# Patient Record
Sex: Female | Born: 1975 | Race: Black or African American | Hispanic: No | Marital: Married | Smoking: Never smoker
Health system: Southern US, Community
[De-identification: ages and names within clinical notes are randomized; demographics above are authoritative.]

## PROBLEM LIST (undated history)

## (undated) ENCOUNTER — Emergency Department (HOSPITAL_COMMUNITY): Admission: EM | Payer: BLUE CROSS/BLUE SHIELD

## (undated) DIAGNOSIS — D649 Anemia, unspecified: Secondary | ICD-10-CM

## (undated) DIAGNOSIS — K649 Unspecified hemorrhoids: Secondary | ICD-10-CM

## (undated) DIAGNOSIS — Z801 Family history of malignant neoplasm of trachea, bronchus and lung: Secondary | ICD-10-CM

## (undated) DIAGNOSIS — C801 Malignant (primary) neoplasm, unspecified: Secondary | ICD-10-CM

## (undated) DIAGNOSIS — Z8049 Family history of malignant neoplasm of other genital organs: Secondary | ICD-10-CM

## (undated) DIAGNOSIS — Z803 Family history of malignant neoplasm of breast: Secondary | ICD-10-CM

## (undated) DIAGNOSIS — F419 Anxiety disorder, unspecified: Secondary | ICD-10-CM

## (undated) HISTORY — DX: Family history of malignant neoplasm of other genital organs: Z80.49

## (undated) HISTORY — DX: Family history of malignant neoplasm of breast: Z80.3

## (undated) HISTORY — DX: Unspecified hemorrhoids: K64.9

## (undated) HISTORY — DX: Family history of malignant neoplasm of trachea, bronchus and lung: Z80.1

---
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
## 2019-09-23 ENCOUNTER — Other Ambulatory Visit: Payer: Self-pay | Admitting: Obstetrics and Gynecology

## 2019-09-23 DIAGNOSIS — N6310 Unspecified lump in the right breast, unspecified quadrant: Secondary | ICD-10-CM

## 2019-09-26 ENCOUNTER — Ambulatory Visit
Admission: RE | Admit: 2019-09-26 | Discharge: 2019-09-26 | Disposition: A | Discharge status: Home / Self Care | Payer: Self-pay | Source: Ambulatory Visit | Attending: Obstetrics and Gynecology | Admitting: Obstetrics and Gynecology

## 2019-09-26 ENCOUNTER — Other Ambulatory Visit: Payer: Self-pay | Admitting: Obstetrics and Gynecology

## 2019-09-26 ENCOUNTER — Ambulatory Visit
Admission: RE | Admit: 2019-09-26 | Discharge: 2019-09-26 | Disposition: A | Discharge status: Home / Self Care | Payer: BC Managed Care – PPO | Source: Ambulatory Visit | Attending: Obstetrics and Gynecology | Admitting: Obstetrics and Gynecology

## 2019-09-26 ENCOUNTER — Other Ambulatory Visit: Payer: Self-pay

## 2019-09-26 DIAGNOSIS — N6310 Unspecified lump in the right breast, unspecified quadrant: Secondary | ICD-10-CM

## 2019-09-26 DIAGNOSIS — E041 Nontoxic single thyroid nodule: Secondary | ICD-10-CM

## 2019-09-26 DIAGNOSIS — N6320 Unspecified lump in the left breast, unspecified quadrant: Secondary | ICD-10-CM

## 2019-09-28 ENCOUNTER — Ambulatory Visit
Admission: RE | Admit: 2019-09-28 | Discharge: 2019-09-28 | Disposition: A | Discharge status: Home / Self Care | Payer: BC Managed Care – PPO | Source: Ambulatory Visit | Attending: Obstetrics and Gynecology | Admitting: Obstetrics and Gynecology

## 2019-09-28 DIAGNOSIS — E041 Nontoxic single thyroid nodule: Secondary | ICD-10-CM

## 2019-10-03 ENCOUNTER — Other Ambulatory Visit: Payer: Self-pay

## 2019-10-03 ENCOUNTER — Ambulatory Visit
Admission: RE | Admit: 2019-10-03 | Discharge: 2019-10-03 | Disposition: A | Discharge status: Home / Self Care | Payer: BC Managed Care – PPO | Source: Ambulatory Visit | Attending: Obstetrics and Gynecology | Admitting: Obstetrics and Gynecology

## 2019-10-03 DIAGNOSIS — N6310 Unspecified lump in the right breast, unspecified quadrant: Secondary | ICD-10-CM

## 2019-10-11 ENCOUNTER — Ambulatory Visit: Payer: Self-pay | Admitting: Surgery

## 2019-10-11 NOTE — H&P (Signed)
Krista [name] Documented: 10/11/2019 9:42 AM Location: [facility] Patient #: [id number] DOB: [birth year] Married / Language: English / Race: Black or African American Female   History of Present Illness [name] A. Tehilla Coffel MD; 10/11/2019 4:20 PM) Patient words: Patient sent at the request of Krista [name] [name].D. for enlarging mass in the upper inner quadrant of the right breast. This is been present since last October. It is grown over the last 4-5 months. Mammogram performed which shows a 5 cm mass of her inner quadrant right breast. 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 Patient denies any nipple discharge or pain. She has no other complaints.            44 year old with an enlarging palpable lump in the UPPER INNER RIGHT breast which she states has been there for approximately 4-5 months. This is the patient's initial baseline mammogram.  Family history of breast cancer in her mother and in her sister.  EXAM: DIGITAL DIAGNOSTIC BILATERAL MAMMOGRAM WITH CAD AND TOMO  LIMITED ULTRASOUND BILATERAL BREASTS  COMPARISON: None.  ACR Breast Density Category c: The breast tissue is heterogeneously dense, which may obscure small masses.  FINDINGS: Tomosynthesis and synthesized full field CC and MLO views of both breasts were obtained. Tomosynthesis and synthesized spot compression tangential view of the area of concern in the RIGHT breast was also obtained.  RIGHT: There is a mass with irregular margins in the UPPER INNER QUADRANT at POSTERIOR depth, measuring just over 5 cm maximally, associated with architectural distortion. There are no associated suspicious calcifications. No suspicious findings elsewhere in the RIGHT breast.  Targeted RIGHT breast ultrasound is performed, showing a large hypoechoic mass with irregular margins at the 1 o'clock position approximately 14 cm from nipple,  measuring approximately 2.8 x 5.0 x 5.1 cm, demonstrating posterior acoustic shadowing and demonstrating internal power Doppler flow. A linear structure extending from the margin of the mass within normal fibroglandular tissue may represent a contiguous duct filled with tumor.  Sonographic evaluation of the RIGHT axilla demonstrates no pathologic lymphadenopathy.  LEFT: Focal asymmetry involving the UPPER OUTER QUADRANT at POSTERIOR depth. I do not identify a discrete mass in this location. No suspicious findings elsewhere in the LEFT breast.  Targeted LEFT breast ultrasound is performed, showing normal dense fibroglandular tissue in the [place]. Benign simple cysts are present, the largest at the 2 o'clock position approximately 6 cm from the nipple measuring approximately 4 x 6 x 5 mm. No suspicious solid mass or abnormal acoustic shadowing is identified.  Mammographic images were processed with CAD.  On correlative physical exam, there is a firm fixed mass measuring approximately 5-6 cm in the UPPER INNER QUADRANT of the RIGHT breast corresponding to what the patient is feeling.  IMPRESSION: 1. Highly suspicious approximate 5.1 cm mass involving the UPPER INNER QUADRANT of the RIGHT breast. 2. No pathologic RIGHT axillary lymphadenopathy. 3. No mammographic or sonographic evidence of malignancy involving the LEFT breast.  RECOMMENDATION: Ultrasound-guided core needle biopsy of the RIGHT breast mass.  The ultrasound biopsy procedure was discussed with the patient and her questions were answered. She wishes to proceed and the biopsy has been scheduled at her convenience.  I have discussed the findings and recommendations with the patient.  BI-RADS CATEGORY 5: Highly suggestive of malignancy.   Electronically Signed By: Krista [name] [name].D. On: 09/26/2019 11:54  ADDITIONAL INFORMATION: PROGNOSTIC  INDICATORS Results: IMMUNOHISTOCHEMICAL AND MORPHOMETRIC ANALYSIS PERFORMED MANUALLY The tumor cells are NEGATIVE for Her2 (1+). Estrogen Receptor: 0%, NEGATIVE Progesterone Receptor: 0%, NEGATIVE Proliferation Marker Ki67: 80% REFERENCE RANGE ESTROGEN RECEPTOR NEGATIVE 0% POSITIVE =>1% REFERENCE RANGE PROGESTERONE RECEPTOR NEGATIVE 0% POSITIVE =>1% All controls stained appropriately Enid Cutter MD Pathologist, Electronic Signature ( Signed 10/05/2019) FINAL DIAGNOSIS Diagnosis Breast, right, needle core biopsy, 1 o'clock, 14cmfn - INVASIVE DUCTAL CARCINOMA, SEE COMMENT. 1 of 2 FINAL for Krista [name], Krista [name] ([id number]) Microscopic Comment The carcinoma appears grade 3 and measures 14 mm in greatest linear extent. Prognostic makers will be ordered. Dr. Lyndon Code has reviewed the case. The case was called to [facility] on 10/04/2019.  The patient is a 44 year old female.   Past Surgical History Krista [name], CMA; 10/11/2019 10:14 AM) No pertinent past surgical history   Diagnostic Studies History Krista [name], CMA; 10/11/2019 10:14 AM) Colonoscopy  never Mammogram  within last year Pap Smear  1-5 years ago  Allergies [facility] Teressa Senter, CMA; 10/11/2019 9:43 AM) No Known Allergies [10/11/2019]: No Known Drug Allergies [10/11/2019]:  Medication History ([place], CMA; 10/11/2019 9:43 AM) No Current Medications Medications Reconciled  Social History Krista [name], CMA; 10/11/2019 10:14 AM) Alcohol use  Occasional alcohol use. Caffeine use  Coffee. No drug use  Tobacco use  Former smoker.  Family History Krista Lull R. Krista [name], CMA; 10/11/2019 10:14 AM) Alcohol Abuse  Father, Mother, Sister. Breast Cancer  Mother. Cerebrovascular Accident  Father. Depression  Brother, Daughter, Father, Mother, Sister, Son. Diabetes Mellitus  Mother. Heart Disease  Mother. Heart disease in female family member before age 2   Hypertension  Mother. Thyroid problems  Mother.  Pregnancy / Birth History Krista [name], CMA; 10/11/2019 10:14 AM) Age at menarche  38 years. Contraceptive History  Contraceptive implant. Gravida  6 Length (months) of breastfeeding  7-12 Maternal age  59-20 Para  3 Regular periods   Other Problems Krista [name], CMA; 10/11/2019 10:14 AM) Anxiety Disorder  Hemorrhoids     Review of Systems [facility] R. [name] CMA; 10/11/2019 10:14 AM) General Present- Fatigue and Night Sweats. Not Present- Appetite Loss, Chills, Fever, Weight Gain and Weight Loss. Skin Not Present- Change in Wart/Mole, Dryness, Hives, Jaundice, New Lesions, Non-Healing Wounds, Rash and Ulcer. HEENT Present- Hoarseness and Wears glasses/contact lenses. Not Present- Earache, Hearing Loss, Nose Bleed, Oral Ulcers, Ringing in the Ears, Seasonal Allergies, Sinus Pain, Sore Throat, Visual Disturbances and Yellow Eyes. Respiratory Present- Wheezing. Not Present- Bloody sputum, Chronic Cough, Difficulty Breathing and Snoring. Breast Present- Breast Mass. Not Present- Breast Pain, Nipple Discharge and Skin Changes. Cardiovascular Present- Shortness of Breath. Not Present- Chest Pain, Difficulty Breathing Lying Down, Leg Cramps, Palpitations, Rapid Heart Rate and Swelling of Extremities. Gastrointestinal Present- Bloating. Not Present- Abdominal Pain, Bloody Stool, Change in Bowel Habits, Chronic diarrhea, Constipation, Difficulty Swallowing, Excessive gas, Gets full quickly at meals, Hemorrhoids, Indigestion, Nausea, Rectal Pain and Vomiting. Musculoskeletal Present- Joint Stiffness. Not Present- Back Pain, Joint Pain, Muscle Pain, Muscle Weakness and Swelling of Extremities. Neurological Not Present- Decreased Memory, Fainting, Headaches, Numbness, Seizures, Tingling, Tremor, Trouble walking and Weakness. Psychiatric Not Present- Anxiety, Bipolar, Change in Sleep Pattern, Depression, Fearful and Frequent  crying. Endocrine Present- Hair Changes. Not Present- Cold Intolerance, Excessive Hunger, Heat Intolerance, Hot flashes and New Diabetes.  Vitals (Chanel Nolan CMA; 10/11/2019 9:43 AM) 10/11/2019 9:43 AM Weight: 214.13 lb Height: 67in Body Surface Area: 2.08 [name] Body Mass  Index: 33.54 kg/[name]  Temp.: 62F  Pulse: 85 (Regular)  BP: 122/72 (Sitting, Left Arm, Standard)       Physical Exam (Ashley Montminy A. Briza Bark MD; 10/11/2019 4:21 PM) General Mental Status-Alert. General Appearance-Consistent with stated age. Hydration-Well hydrated. Voice-Normal.  Breast Note: Large colon mass medial right breasts not fixed to any underwent structures. This measured about 5 cm. Left breast is normal.   Cardiovascular Cardiovascular examination reveals -normal heart sounds, regular rate and rhythm with no murmurs and normal pedal pulses bilaterally.  Abdomen Inspection Inspection of the abdomen reveals - No Hernias. Skin - Scar - no surgical scars. Palpation/Percussion Palpation and Percussion of the abdomen reveal - Soft, Non Tender, No Rebound tenderness, No Rigidity (guarding) and No hepatosplenomegaly. Auscultation Auscultation of the abdomen reveals - Bowel sounds normal.  Neurologic Neurologic evaluation reveals -alert and oriented x 3 with no impairment of recent or remote memory. Mental Status-Normal.  Neuropsychiatric The patient's mood and affect are described as -normal. Associations-intact. Judgment and Insight-insight is appropriate concerning matters relevant to self.  Musculoskeletal Normal Exam - Left-Upper Extremity Strength Normal and Lower Extremity Strength Normal. Normal Exam - Right-Upper Extremity Strength Normal and Lower Extremity Strength Normal.  Lymphatic Head & Neck  General Head & Neck Lymphatics: Bilateral - Description - Normal. Axillary  General Axillary Region: Bilateral - Description - Normal. Tenderness - Non  Tender.    Assessment & Plan (Cordaryl Decelles A. 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  We'll more than likely require chemotherapy and he reports. I discussed this with her today Referral to genetics Discussed breast conserving surgery/ mastectomy depending upon response to chemotherapy Total time 45 minutes to discuss care, examination, document review, review films, review pathology and discuss surgical options, risks, complications, and other treatment options. Pt requires port placement for chemotherapy. Risk include bleeding, infection, pneumothorax, hemothorax, mediastinal injury, nerve injury , blood vessel injury, strke, blood clots, death, migration. embolization and need for additional procedures. Pt agrees to proceed. Current Plans Pt Education - [facility] Free Text Education/Instructions: discussed with patient and provided information. You are being scheduled for surgery- Our schedulers will call you.  You should hear from our office's scheduling department within 5 working days about the location, date, and time of surgery. We try to make accommodations for patient's preferences in scheduling surgery, but sometimes the OR schedule or the surgeon's schedule prevents [place] from making those accommodations.  If you have not heard from our office [phone]) in 5 working days, call the office and ask for your surgeon's nurse.  If you have other questions about your diagnosis, plan, or surgery, call the office and ask for your surgeon's nurse.  Pt Education - [facility] Breast Cancer Information Given - [employer] "Breast Journey" Package Use of a central venous catheter for intravenous therapy was discussed. Technique of catheter placement using ultrasound and fluoroscopy guidance was discussed. Risks such as bleeding, infection, pneumothorax, catheter occlusion, reoperation, and other  risks were discussed. I noted a good likelihood this will help address the problem. Questions were answered. The patient expressed understanding & wishes to proceed.   Signed electronically by Turner Daniels, MD (10/11/2019 4:23 PM)

## 2019-10-12 ENCOUNTER — Telehealth: Payer: Self-pay | Admitting: Oncology

## 2019-10-12 ENCOUNTER — Other Ambulatory Visit: Payer: Self-pay | Admitting: *Deleted

## 2019-10-12 ENCOUNTER — Encounter: Payer: Self-pay | Admitting: Adult Health

## 2019-10-12 DIAGNOSIS — Z171 Estrogen receptor negative status [ER-]: Secondary | ICD-10-CM

## 2019-10-12 DIAGNOSIS — C50411 Malignant neoplasm of upper-outer quadrant of right female breast: Secondary | ICD-10-CM

## 2019-10-12 NOTE — Telephone Encounter (Signed)
Received a msg from [facility] to schedule Krista [name] for an urgent appt for breast cancer. Pt has been scheduled to see Dr. Jana Hakim on 4/15 at 4pm w/labs at 330pm.

## 2019-10-13 ENCOUNTER — Ambulatory Visit: Payer: BC Managed Care – PPO | Admitting: Oncology

## 2019-10-13 ENCOUNTER — Other Ambulatory Visit: Payer: BC Managed Care – PPO

## 2019-10-16 ENCOUNTER — Encounter: Payer: Self-pay | Admitting: Oncology

## 2019-10-16 NOTE — Progress Notes (Signed)
[place]  Telephone:([phone]) [phone] Fax:(336) [phone]     ID: Krista [name] DOB: [birth year]  MR#: [id number]  STM#:[id number]  Patient Care Team: Patient, No Pcp Per as PCP - General (General Practice) Ranisha Allaire, [name], MD as Consulting Physician (Oncology) Erroll Luna, MD as Consulting Physician (General Surgery) Eppie Gibson, MD as Attending Physician (Radiation Oncology) Chauncey Cruel, MD OTHER MD:  CHIEF COMPLAINT: Triple negative breast cancer  CURRENT TREATMENT: Neoadjuvant chemotherapy   HISTORY OF CURRENT ILLNESS: Krista [name] ["Krista [name]"] herself palpated a lump in the upper-inner right breast sometime late December 2020 or early January 2021.  As the mass grew larger and persisted through menstrual periods she brought it to medical attention and underwent bilateral diagnostic mammography with tomography and bilateral breast ultrasonography at [facility] on 09/26/2019 showing: breast density category C; 5.1 cm mass involving upper-inner quadrant of right breast; no evidence of right axillary lymphadenopathy or left breast malignancy.  Accordingly on 10/03/2019 she proceeded to biopsy of the right breast area in question. The pathology from this procedure ([id number]) showed: invasive ductal carcinoma, grade 3. Prognostic indicators significant for: estrogen receptor, 0% negative and progesterone receptor, 0% negative. Proliferation marker Ki67 at 80%. HER2 negative by immunohistochemistry (1+).  The patient's subsequent history is as detailed below.   INTERVAL HISTORY: Krista [name] was evaluated in the breast cancer clinic on 10/17/2019.  Her husband [name] participated through speaker phone. Her case was also presented at the multidisciplinary breast cancer conference on 10/12/2019. At that time a preliminary plan was proposed: Genetics testing, neoadjuvant chemotherapy, breast conserving surgery if possible, adjuvant  radiation.   REVIEW OF SYSTEMS: The patient denies unusual headaches, visual changes, nausea, vomiting, stiff neck, dizziness, or gait imbalance. There has been no cough, phlegm production, or pleurisy, no chest pain or pressure, and no change in bowel or bladder habits. The patient denies fever, rash, bleeding, unexplained fatigue or unexplained weight loss.  She exercises by walking, about 2 miles or so every other day.  She and her family refused to receive the coronavirus vaccine or any other vaccines.  A detailed review of systems was otherwise entirely negative.   PAST MEDICAL HISTORY: Past Medical History:  Diagnosis Date  . Anxiety   . Hemorrhoids     PAST SURGICAL HISTORY: No prior surgeries  FAMILY HISTORY: Family History  Problem Relation Age of Onset  . Breast cancer Mother   . Breast cancer Sister   The patient's father died at age 25 from cardiac complications of drug use.  His mother, the patient's paternal grandmother had what seems to have been cancer of the lung metastatic to the spine.  There was no other cancer on his side of the family to the patient's knowledge.  The patient's mother died at age 28 in the setting of multiple medical issues but she had undergone double mastectomy at the age of 61.  Her mother, the patient's maternal grandmother had cervical cancer.  The patient's mother had 3 brothers and 3 sisters.  1 of those 3 sisters had breast cancer diagnosed at the age of 72.  The patient herself has 2 brothers and 3 sisters one of her sisters was diagnosed with breast cancer at the age of 75 and died at the age of 77   GYNECOLOGIC HISTORY:  No LMP recorded. Menarche: 44 years old Age at first live birth: 44 years old GX P 3 LMP regular, last approximately 5 days,  of which the second day is heavy Contraceptive: Husband status post vasectomy HRT no  Hysterectomy? no BSO?  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  Her husband [name] is a Engineer, manufacturing systems at ENT.  Their children are Rowe Pavy, 20, who is Scientist, product/process development at [employer], 18, who will study psychology at [employer] starting the fall 2021; and Alanna, 53, currently attending [employer].  The patient is not a church attender    ADVANCED DIRECTIVES: In the absence of any documents to the contrary the patient's husband is her healthcare power of attorney   HEALTH MAINTENANCE: Social History   Tobacco Use  . Smoking status: Not on file  Substance Use Topics  . Alcohol use: Not on file  . Drug use: Not on file     Colonoscopy: n/a (age)  PAP: UTD  Bone density: n/a (age)   No Known Allergies  Current Outpatient Medications  Medication Sig Dispense Refill  . Daily Multiple Vitamins tablet Take 1 tablet by mouth daily.     No current facility-administered medications for this visit.    OBJECTIVE: African-American woman who appears younger than stated age  44:   10/17/19 1616  BP: (!) 154/108  Pulse: 79  Resp: 18  Temp: 97.8 F (36.6 C)  SpO2: 100%     There is no height or weight on file to calculate BMI.   Wt Readings from Last 3 Encounters:  10/17/19 211 lb 4.8 oz (95.8 kg)      ECOG FS:0 - Asymptomatic  Ocular: Sclerae unicteric, pupils round and equal Ear-nose-throat: Wearing a mask Lymphatic: No cervical or supraclavicular adenopathy Lungs no rales or rhonchi Heart regular rate and rhythm Abd soft, nontender, positive bowel sounds MSK no focal spinal tenderness, no joint edema Neuro: non-focal, well-oriented, appropriate affect Breasts: There is an easily palpable mass in the upper inner quadrant of the right breast which distorts the breast contour.  It measures approximately 4 to 5 inches by palpation.  There is no overlying skin involvement and no nipple involvement.  The left breast is benign.  Both axillae are benign.   LAB  RESULTS:  CMP     Component Value Date/Time   NA 141 10/17/2019 1529   K 4.0 10/17/2019 1529   CL 106 10/17/2019 1529   CO2 24 10/17/2019 1529   GLUCOSE 87 10/17/2019 1529   BUN 10 10/17/2019 1529   CREATININE 0.95 10/17/2019 1529   CALCIUM 8.9 10/17/2019 1529   PROT 7.8 10/17/2019 1529   ALBUMIN 4.0 10/17/2019 1529   AST 16 10/17/2019 1529   ALT 13 10/17/2019 1529   ALKPHOS 66 10/17/2019 1529   BILITOT 1.1 10/17/2019 1529   GFRNONAA >60 10/17/2019 1529   GFRAA >60 10/17/2019 1529    No results found for: TOTALPROTELP, ALBUMINELP, A1GS, A2GS, BETS, BETA2SER, GAMS, MSPIKE, SPEI  Lab Results  Component Value Date   WBC 6.9 10/17/2019   NEUTROABS 4.0 10/17/2019   HGB 11.8 (L) 10/17/2019   HCT 34.3 (L) 10/17/2019   MCV 71.5 (L) 10/17/2019   PLT 269 10/17/2019    No results found for: LABCA2  No components found for: QDIYME158  No results for input(s): INR in the last 168 hours.  No results found for: LABCA2  No results found for: XEN407  No results found for: WKG881  No results found for: JSR159  No  results found for: CA2729  No components found for: HGQUANT  No results found for: CEA1 / No results found for: CEA1   No results found for: AFPTUMOR  No results found for: CHROMOGRNA  No results found for: KPAFRELGTCHN, LAMBDASER, KAPLAMBRATIO (kappa/lambda light chains)  No results found for: HGBA, HGBA2QUANT, HGBFQUANT, HGBSQUAN (Hemoglobinopathy evaluation)   No results found for: LDH  No results found for: IRON, TIBC, IRONPCTSAT (Iron and TIBC)  No results found for: FERRITIN  Urinalysis No results found for: COLORURINE, APPEARANCEUR, LABSPEC, PHURINE, GLUCOSEU, HGBUR, BILIRUBINUR, KETONESUR, PROTEINUR, UROBILINOGEN, NITRITE, LEUKOCYTESUR   STUDIES: US BREAST LTD UNI LEFT INC AXILLA  Result Date: 09/26/2019 CLINICAL DATA:  44 year old with an enlarging palpable lump in the UPPER INNER RIGHT breast which she states has been there for  approximately 4-5 months. This is the patient's initial baseline mammogram. Family history of breast cancer in her mother and in her sister. EXAM: DIGITAL DIAGNOSTIC BILATERAL MAMMOGRAM WITH CAD AND TOMO LIMITED ULTRASOUND BILATERAL BREASTS COMPARISON:  None. ACR Breast Density Category c: The breast tissue is heterogeneously dense, which may obscure small masses. FINDINGS: Tomosynthesis and synthesized full field CC and MLO views of both breasts were obtained. Tomosynthesis and synthesized spot compression tangential view of the area of concern in the RIGHT breast was also obtained. RIGHT: There is a mass with irregular margins in the UPPER INNER QUADRANT at POSTERIOR depth, measuring just over 5 cm maximally, associated with architectural distortion. There are no associated suspicious calcifications. No suspicious findings elsewhere in the RIGHT breast. Targeted RIGHT breast ultrasound is performed, showing a large hypoechoic mass with irregular margins at the 1 o'clock position approximately 14 cm from nipple, measuring approximately 2.8 x 5.0 x 5.1 cm, demonstrating posterior acoustic shadowing and demonstrating internal power Doppler flow. A linear structure extending from the margin of the mass within normal fibroglandular tissue may represent a contiguous duct filled with tumor. Sonographic evaluation of the RIGHT axilla demonstrates no pathologic lymphadenopathy. LEFT: Focal asymmetry involving the UPPER OUTER QUADRANT at POSTERIOR depth. I do not identify a discrete mass in this location. No suspicious findings elsewhere in the LEFT breast. Targeted LEFT breast ultrasound is performed, showing normal dense fibroglandular tissue in the [place]. Benign simple cysts are present, the largest at the 2 o'clock position approximately 6 cm from the nipple measuring approximately 4 x 6 x 5 mm. No suspicious solid mass or abnormal acoustic shadowing is identified. Mammographic images were processed with  CAD. On correlative physical exam, there is a firm fixed mass measuring approximately 5-6 cm in the UPPER INNER QUADRANT of the RIGHT breast corresponding to what the patient is feeling. IMPRESSION: 1. Highly suspicious approximate 5.1 cm mass involving the UPPER INNER QUADRANT of the RIGHT breast. 2. No pathologic RIGHT axillary lymphadenopathy. 3. No mammographic or sonographic evidence of malignancy involving the LEFT breast. RECOMMENDATION: Ultrasound-guided core needle biopsy of the RIGHT breast mass. The ultrasound biopsy procedure was discussed with the patient and her questions were answered. She wishes to proceed and the biopsy has been scheduled at her convenience. I have discussed the findings and recommendations with the patient. BI-RADS CATEGORY  5: Highly suggestive of malignancy. Electronically Signed   By: [name] M.D.   On: 09/26/2019 11:54   US BREAST LTD UNI RIGHT INC AXILLA  Result Date: 09/26/2019 CLINICAL DATA:  44 year old with an enlarging palpable lump in the UPPER INNER RIGHT breast which she states has been there for approximately 4-5 months. This is  the patient's initial baseline mammogram. Family history of breast cancer in her mother and in her sister. EXAM: DIGITAL DIAGNOSTIC BILATERAL MAMMOGRAM WITH CAD AND TOMO LIMITED ULTRASOUND BILATERAL BREASTS COMPARISON:  None. ACR Breast Density Category c: The breast tissue is heterogeneously dense, which may obscure small masses. FINDINGS: Tomosynthesis and synthesized full field CC and MLO views of both breasts were obtained. Tomosynthesis and synthesized spot compression tangential view of the area of concern in the RIGHT breast was also obtained. RIGHT: There is a mass with irregular margins in the UPPER INNER QUADRANT at POSTERIOR depth, measuring just over 5 cm maximally, associated with architectural distortion. There are no associated suspicious calcifications. No suspicious findings elsewhere in the RIGHT breast. Targeted  RIGHT breast ultrasound is performed, showing a large hypoechoic mass with irregular margins at the 1 o'clock position approximately 14 cm from nipple, measuring approximately 2.8 x 5.0 x 5.1 cm, demonstrating posterior acoustic shadowing and demonstrating internal power Doppler flow. A linear structure extending from the margin of the mass within normal fibroglandular tissue may represent a contiguous duct filled with tumor. Sonographic evaluation of the RIGHT axilla demonstrates no pathologic lymphadenopathy. LEFT: Focal asymmetry involving the UPPER OUTER QUADRANT at POSTERIOR depth. I do not identify a discrete mass in this location. No suspicious findings elsewhere in the LEFT breast. Targeted LEFT breast ultrasound is performed, showing normal dense fibroglandular tissue in the [place]. Benign simple cysts are present, the largest at the 2 o'clock position approximately 6 cm from the nipple measuring approximately 4 x 6 x 5 mm. No suspicious solid mass or abnormal acoustic shadowing is identified. Mammographic images were processed with CAD. On correlative physical exam, there is a firm fixed mass measuring approximately 5-6 cm in the UPPER INNER QUADRANT of the RIGHT breast corresponding to what the patient is feeling. IMPRESSION: 1. Highly suspicious approximate 5.1 cm mass involving the UPPER INNER QUADRANT of the RIGHT breast. 2. No pathologic RIGHT axillary lymphadenopathy. 3. No mammographic or sonographic evidence of malignancy involving the LEFT breast. RECOMMENDATION: Ultrasound-guided core needle biopsy of the RIGHT breast mass. The ultrasound biopsy procedure was discussed with the patient and her questions were answered. She wishes to proceed and the biopsy has been scheduled at her convenience. I have discussed the findings and recommendations with the patient. BI-RADS CATEGORY  5: Highly suggestive of malignancy. Electronically Signed   By: [name] M.D.   On: 09/26/2019  11:54   MM DIAG BREAST TOMO BILATERAL  Result Date: 09/26/2019 CLINICAL DATA:  44 year old with an enlarging palpable lump in the UPPER INNER RIGHT breast which she states has been there for approximately 4-5 months. This is the patient's initial baseline mammogram. Family history of breast cancer in her mother and in her sister. EXAM: DIGITAL DIAGNOSTIC BILATERAL MAMMOGRAM WITH CAD AND TOMO LIMITED ULTRASOUND BILATERAL BREASTS COMPARISON:  None. ACR Breast Density Category c: The breast tissue is heterogeneously dense, which may obscure small masses. FINDINGS: Tomosynthesis and synthesized full field CC and MLO views of both breasts were obtained. Tomosynthesis and synthesized spot compression tangential view of the area of concern in the RIGHT breast was also obtained. RIGHT: There is a mass with irregular margins in the UPPER INNER QUADRANT at POSTERIOR depth, measuring just over 5 cm maximally, associated with architectural distortion. There are no associated suspicious calcifications. No suspicious findings elsewhere in the RIGHT breast. Targeted RIGHT breast ultrasound is performed, showing a large hypoechoic mass with irregular margins at the 1 o'clock position  approximately 14 cm from nipple, measuring approximately 2.8 x 5.0 x 5.1 cm, demonstrating posterior acoustic shadowing and demonstrating internal power Doppler flow. A linear structure extending from the margin of the mass within normal fibroglandular tissue may represent a contiguous duct filled with tumor. Sonographic evaluation of the RIGHT axilla demonstrates no pathologic lymphadenopathy. LEFT: Focal asymmetry involving the UPPER OUTER QUADRANT at POSTERIOR depth. I do not identify a discrete mass in this location. No suspicious findings elsewhere in the LEFT breast. Targeted LEFT breast ultrasound is performed, showing normal dense fibroglandular tissue in the [place]. Benign simple cysts are present, the largest at the 2  o'clock position approximately 6 cm from the nipple measuring approximately 4 x 6 x 5 mm. No suspicious solid mass or abnormal acoustic shadowing is identified. Mammographic images were processed with CAD. On correlative physical exam, there is a firm fixed mass measuring approximately 5-6 cm in the UPPER INNER QUADRANT of the RIGHT breast corresponding to what the patient is feeling. IMPRESSION: 1. Highly suspicious approximate 5.1 cm mass involving the UPPER INNER QUADRANT of the RIGHT breast. 2. No pathologic RIGHT axillary lymphadenopathy. 3. No mammographic or sonographic evidence of malignancy involving the LEFT breast. RECOMMENDATION: Ultrasound-guided core needle biopsy of the RIGHT breast mass. The ultrasound biopsy procedure was discussed with the patient and her questions were answered. She wishes to proceed and the biopsy has been scheduled at her convenience. I have discussed the findings and recommendations with the patient. BI-RADS CATEGORY  5: Highly suggestive of malignancy. Electronically Signed   By: [name] M.D.   On: 09/26/2019 11:54   US THYROID  Result Date: 09/28/2019 CLINICAL DATA:  Thyroid nodule EXAM: THYROID ULTRASOUND TECHNIQUE: Ultrasound examination of the thyroid gland and adjacent soft tissues was performed. COMPARISON:  None. FINDINGS: Parenchymal Echotexture: Mildly heterogenous Isthmus: 0.4 cm Right lobe: 4.7 x 1.4 x 2.5 cm Left lobe: 6.3 x 1.7 x 2.2 cm _________________________________________________________ Estimated total number of nodules >/= 1 cm: 1 Number of spongiform nodules >/=  2 cm not described below (TR1): 0 Number of mixed cystic and solid nodules >/= 1.5 cm not described below (TR2): 0 _________________________________________________________ There is a 0.9 cm isoechoic thyroid nodule in the left mid thyroid gland. No further follow-up is required for this nodule. Nodule # 2: Location: Left; Inferior Maximum size: 1 cm; Other 2 dimensions: 1 x 0.9 cm  Composition: solid/almost completely solid (2) Echogenicity: hypoechoic (2) Shape: not taller-than-wide (0) Margins: smooth (0) Echogenic foci: none (0) ACR TI-RADS total points: 4. ACR TI-RADS risk category: TR4 (4-6 points). ACR TI-RADS recommendations: *Given size (>/= 1 - 1.4 cm) and appearance, a follow-up ultrasound in 1 year should be considered based on TI-RADS criteria. _________________________________________________________ There are no worrisome cervical lymph nodes identified on this study. IMPRESSION: 1. Mildly heterogeneous and mild asymmetric enlargement of the thyroid gland as measured above. 2. There is a 1 cm TR4 thyroid nodule in the left inferior thyroid gland. A 1 year follow-up ultrasound is recommended for this thyroid nodule. The above is in keeping with the ACR TI-RADS recommendations - J Am Coll Radiol 2017;14:587-595. Electronically Signed   By: Constance Holster M.D.   On: 09/28/2019 17:49   MM CLIP PLACEMENT RIGHT  Result Date: 10/03/2019 CLINICAL DATA:  Status post ultrasound-guided core needle biopsy of a right breast mass. EXAM: DIAGNOSTIC RIGHT MAMMOGRAM POST ULTRASOUND BIOPSY COMPARISON:  Previous exam(s). FINDINGS: Mammographic images were obtained following ultrasound guided biopsy of a right breast mass.  The biopsy marking clip is in expected position at the site of biopsy. IMPRESSION: Appropriate positioning of the ribbon shaped biopsy marking clip at the site of biopsy in the within the central aspect of the upper inner quadrant mass. Final Assessment: Post Procedure Mammograms for Marker Placement Electronically Signed   By: Lajean Manes M.D.   On: 10/03/2019 14:45   [place] RT BREAST BX W LOC DEV 1ST LESION IMG BX SPEC US GUIDE  Addendum Date: 10/05/2019   ADDENDUM REPORT: 10/04/2019 17:22 ADDENDUM: Pathology revealed GRADE III INVASIVE DUCTAL CARCINOMA of the RIGHT breast, 1 o'clock, 14cm from nipple. This was found to be concordant by Dr. Lajean Manes. Pathology  results were discussed with the patient by telephone. The patient reported doing well after the biopsy with tenderness at the site. Post biopsy instructions and care were reviewed and questions were answered. The patient was encouraged to call [facility] for any additional concerns. Surgical consultation has been arranged with Dr. Erroll Luna at [facility] on October 12, 2019. Pathology results reported by Stacie Acres RN on 10/04/2019. Electronically Signed   By: Lajean Manes M.D.   On: 10/04/2019 17:22   Result Date: 10/05/2019 CLINICAL DATA:  Patient presents for ultrasound-guided core needle biopsy of a 5.1 cm, upper quadrant, palpable right breast mass. EXAM: ULTRASOUND GUIDED RIGHT BREAST CORE NEEDLE BIOPSY COMPARISON:  Previous exam(s). PROCEDURE: I met with the patient and we discussed the procedure of ultrasound-guided biopsy, including benefits and alternatives. We discussed the high likelihood of a successful procedure. We discussed the risks of the procedure, including infection, bleeding, tissue injury, clip migration, and inadequate sampling. Informed written consent was given. The usual time-out protocol was performed immediately prior to the procedure. Lesion quadrant: Upper inner quadrant Using sterile technique and 1% Lidocaine as local anesthetic, under direct ultrasound visualization, a 12 gauge spring-loaded device was used to perform biopsy of the 1 o'clock position right breast mass using an inferior medial approach. At the conclusion of the procedure ribbon shaped tissue marker clip was deployed into the biopsy cavity. Follow up 2 view mammogram was performed and dictated separately. IMPRESSION: Ultrasound guided biopsy of a right breast mass. No apparent complications. Electronically Signed: By: Lajean Manes M.D. On: 10/03/2019 14:34     ELIGIBLE FOR AVAILABLE RESEARCH PROTOCOL: [id number]  ASSESSMENT: 43 y.o. [place] woman status post right  breast upper inner quadrant biopsy 10/03/2019 for a clinical T3 N0, stage IIIb invasive ductal carcinoma, grade 3, triple negative, with an MIB-1 of 80%  (1) genetics testing pending  (2) neoadjuvant chemotherapy to consist of cyclophosphamide and doxorubicin in dose dense fashion x4 followed by paclitaxel and carboplatin weekly x12  (3) definitive surgery to follow  (4) adjuvant radiation  PLAN: I met today with Krista [name] to review her new diagnosis. Specifically we discussed the biology of her breast cancer, its diagnosis, staging, treatment  options and prognosis. We first reviewed the fact that cancer is not one disease but more than 100 different diseases and that it is important to keep them separate-- otherwise when friends and relatives discuss their own cancer experiences with Krista [name] confusion can result. Similarly we explained that if breast cancer spreads to the bone or liver, the patient would not have bone cancer or liver cancer, but breast cancer in the bone and breast cancer in the liver: one cancer in three places-- not 3 different cancers which otherwise would have to be treated in 3 different  ways.  We discussed the difference between local and systemic therapy. In terms of loco-regional treatment, lumpectomy plus radiation is equivalent to mastectomy as far as survival is concerned. For this reason, and because the cosmetic results are generally superior, we recommend breast conserving surgery.   We also noted that in terms of sequencing of treatments, whether systemic therapy or surgery is done first does not affect the ultimate outcome.  In her case we recommend starting with chemotherapy not only because this will make the surgery easier and the cosmesis better but also because more than 40% of patients like her will attain a complete pathologic response and those patients will have a very good long-term prognosis.  We then discussed the rationale for systemic therapy. There  is some risk that this cancer may have already spread to other parts of her body. Scans cannot answer whether she has microscopic disease elsewhere in her body, however they can rule out metastases and are indicated in stage III cases.  Accordingly they have been ordered.  Next we went over the options for systemic therapy which are anti-estrogens, anti-HER-2 immunotherapy, and chemotherapy. Krista [name] does not meet criteria for anti-HER-2 immunotherapy or antiestrogens.  Her only option for systemic therapy is chemotherapy, which accordingly is what we recommend  Specifically she will receive doxorubicin and cyclophosphamide in dose dense fashion x4 followed by weekly paclitaxel and carboplatin x12.  This would be followed by surgery and then radiation.  She will need an echocardiogram, breast MRI, a port, staging studies, and meeting with our chemotherapy teaching nurse.  Note that the family has a trip planned between 11/01/2019 and 11/04/2019.  Possibly the best time for her to have her port placed would be May 10 and then we could start treatment on May 11.  Krista [name] has a good understanding of the overall plan. She agrees with it. She knows the goal of treatment in her case is [name]. She will call with any problems that may develop before her next visit here.  Total encounter time 65 minutes.Sarajane Jews C. Claire Dolores, MD 10/17/2019 5:40 PM Medical Oncology and Hematology [facility] [place], [place] [address] Tel. [phone]    Fax. [phone]   This document serves as a record of services personally performed by Lurline Del, MD. It was created on his behalf by Wilburn Mylar, a trained medical scribe. The creation of this record is based on the scribe's personal observations and the provider's statements to them.   I, Lurline Del MD, have reviewed the above documentation for accuracy and completeness, and I agree with the above.    *Total Encounter  Time as defined by the Centers for Medicare and Medicaid Services includes, in addition to the face-to-face time of a patient visit (documented in the note above) non-face-to-face time: obtaining and reviewing outside history, ordering and reviewing medications, tests or procedures, care coordination (communications with other health care professionals or caregivers) and documentation in the medical record.

## 2019-10-17 ENCOUNTER — Other Ambulatory Visit: Payer: Self-pay | Admitting: *Deleted

## 2019-10-17 ENCOUNTER — Inpatient Hospital Stay: Payer: BC Managed Care – PPO

## 2019-10-17 ENCOUNTER — Other Ambulatory Visit: Payer: Self-pay

## 2019-10-17 ENCOUNTER — Encounter: Payer: Self-pay | Admitting: *Deleted

## 2019-10-17 ENCOUNTER — Inpatient Hospital Stay: Payer: BC Managed Care – PPO | Attending: Oncology | Admitting: Oncology

## 2019-10-17 VITALS — BP 154/108 | HR 79 | Temp 97.8°F | Resp 18 | Wt 211.3 lb

## 2019-10-17 DIAGNOSIS — C50411 Malignant neoplasm of upper-outer quadrant of right female breast: Secondary | ICD-10-CM

## 2019-10-17 DIAGNOSIS — Z171 Estrogen receptor negative status [ER-]: Secondary | ICD-10-CM | POA: Insufficient documentation

## 2019-10-17 DIAGNOSIS — Z803 Family history of malignant neoplasm of breast: Secondary | ICD-10-CM | POA: Insufficient documentation

## 2019-10-17 DIAGNOSIS — D563 Thalassemia minor: Secondary | ICD-10-CM | POA: Insufficient documentation

## 2019-10-17 DIAGNOSIS — C50211 Malignant neoplasm of upper-inner quadrant of right female breast: Secondary | ICD-10-CM

## 2019-10-17 LAB — CMP (CANCER CENTER ONLY)
ALT: 13 U/L (ref 0–44)
AST: 16 U/L (ref 15–41)
Albumin: 4 g/dL (ref 3.5–5.0)
Alkaline Phosphatase: 66 U/L (ref 38–126)
Anion gap: 11 (ref 5–15)
BUN: 10 mg/dL (ref 6–20)
CO2: 24 mmol/L (ref 22–32)
Calcium: 8.9 mg/dL (ref 8.9–10.3)
Chloride: 106 mmol/L (ref 98–111)
Creatinine: 0.95 mg/dL (ref 0.44–1.00)
GFR, Est AFR Am: >60 mL/min (ref >60)
GFR, Estimated: >60 mL/min (ref >60)
Glucose, Bld: 87 mg/dL (ref 70–99)
Potassium: 4 mmol/L (ref 3.5–5.1)
Sodium: 141 mmol/L (ref 135–145)
Total Bilirubin: 1.1 mg/dL (ref 0.3–1.2)
Total Protein: 7.8 g/dL (ref 6.5–8.1)

## 2019-10-17 LAB — CBC WITH DIFFERENTIAL (CANCER CENTER ONLY)
Abs Immature Granulocytes: 0.01 10*3/uL (ref 0.00–0.07)
Basophils Absolute: 0.1 10*3/uL (ref 0.0–0.1)
Basophils Relative: 1 %
Eosinophils Absolute: 0.1 10*3/uL (ref 0.0–0.5)
Eosinophils Relative: 1 %
HCT: 34.3 % — ABNORMAL LOW (ref 36.0–46.0)
Hemoglobin: 11.8 g/dL — ABNORMAL LOW (ref 12.0–15.0)
Immature Granulocytes: 0 %
Lymphocytes Relative: 35 %
Lymphs Abs: 2.4 10*3/uL (ref 0.7–4.0)
MCH: 24.6 pg — ABNORMAL LOW (ref 26.0–34.0)
MCHC: 34.4 g/dL (ref 30.0–36.0)
MCV: 71.5 fL — ABNORMAL LOW (ref 80.0–100.0)
Monocytes Absolute: 0.4 10*3/uL (ref 0.1–1.0)
Monocytes Relative: 6 %
Neutro Abs: 4 10*3/uL (ref 1.7–7.7)
Neutrophils Relative %: 57 %
Platelet Count: 269 10*3/uL (ref 150–400)
RBC: 4.8 MIL/uL (ref 3.87–5.11)
RDW: 15.7 % — ABNORMAL HIGH (ref 11.5–15.5)
WBC Count: 6.9 10*3/uL (ref 4.0–10.5)
nRBC: 0 % (ref 0.0–0.2)

## 2019-10-17 MED ORDER — PROCHLORPERAZINE MALEATE 10 MG PO TABS: 10.0000 mg | ORAL_TABLET | Freq: Four times a day (QID) | ORAL | 1 refills | Status: DC | PRN

## 2019-10-17 MED ORDER — LIDOCAINE-PRILOCAINE 2.5-2.5 % EX CREA: TOPICAL_CREAM | CUTANEOUS | 3 refills | Status: DC

## 2019-10-17 MED ORDER — LORAZEPAM 0.5 MG PO TABS: 0.5000 mg | ORAL_TABLET | Freq: Every evening | ORAL | 0 refills | Status: DC | PRN

## 2019-10-17 MED ORDER — DEXAMETHASONE 4 MG PO TABS: ORAL_TABLET | ORAL | 1 refills | Status: DC

## 2019-10-17 NOTE — Progress Notes (Signed)
START ON PATHWAY REGIMEN - Breast     A cycle is every 14 days (cycles 1-4):     Doxorubicin      Cyclophosphamide      Pegfilgrastim-xxxx    A cycle is every 21 days (cycles 5-8):     Paclitaxel      Carboplatin   **Always confirm dose/schedule in your pharmacy ordering system**  Patient Characteristics: Preoperative or Nonsurgical Candidate (Clinical Staging), Neoadjuvant Therapy followed by Surgery, Invasive Disease, Chemotherapy, HER2 Negative/Unknown/Equivocal, ER Negative/Unknown, Platinum Therapy Indicated Therapeutic Status: Preoperative or Nonsurgical Candidate (Clinical Staging) AJCC M Category: cM0 AJCC Grade: G3 Breast Surgical Plan: Neoadjuvant Therapy followed by Surgery ER Status: Negative (-) AJCC 8 Stage Grouping: IIIB HER2 Status: Negative (-) AJCC T Category: cT3 AJCC N Category: cN0 PR Status: Negative (-) Type of Therapy: Platinum Therapy Indicated Intent of Therapy: Curative Intent, Discussed with Patient

## 2019-10-18 ENCOUNTER — Inpatient Hospital Stay: Payer: BC Managed Care – PPO

## 2019-10-18 ENCOUNTER — Other Ambulatory Visit: Payer: Self-pay

## 2019-10-18 ENCOUNTER — Inpatient Hospital Stay (HOSPITAL_BASED_OUTPATIENT_CLINIC_OR_DEPARTMENT_OTHER): Payer: BC Managed Care – PPO | Admitting: Genetic Counselor

## 2019-10-18 ENCOUNTER — Other Ambulatory Visit: Payer: Self-pay | Admitting: Genetic Counselor

## 2019-10-18 DIAGNOSIS — Z801 Family history of malignant neoplasm of trachea, bronchus and lung: Secondary | ICD-10-CM | POA: Diagnosis not present

## 2019-10-18 DIAGNOSIS — C50211 Malignant neoplasm of upper-inner quadrant of right female breast: Secondary | ICD-10-CM | POA: Diagnosis not present

## 2019-10-18 DIAGNOSIS — Z171 Estrogen receptor negative status [ER-]: Secondary | ICD-10-CM

## 2019-10-18 DIAGNOSIS — Z803 Family history of malignant neoplasm of breast: Secondary | ICD-10-CM | POA: Diagnosis not present

## 2019-10-18 DIAGNOSIS — Z8049 Family history of malignant neoplasm of other genital organs: Secondary | ICD-10-CM | POA: Diagnosis not present

## 2019-10-18 DIAGNOSIS — C50411 Malignant neoplasm of upper-outer quadrant of right female breast: Secondary | ICD-10-CM

## 2019-10-18 LAB — IRON AND TIBC
Iron: 99 ug/dL (ref 41–142)
Saturation Ratios: 25 % (ref 21–57)
TIBC: 391 ug/dL (ref 236–444)
UIBC: 292 ug/dL (ref 120–384)

## 2019-10-18 LAB — FERRITIN: Ferritin: 45 ng/mL (ref 11–307)

## 2019-10-18 NOTE — Progress Notes (Signed)
Location of Breast Cancer: Malignant neoplasm of upper-inner quadrant of RIGHT breast, estrogen receptor negative  Histology per Pathology Report:  10/03/2019 Diagnosis  Breast, right, needle core biopsy, 1 o'clock, 14cmfn - INVASIVE DUCTAL CARCINOMA, SEE COMMENT. 1 of 2 Microscopic Comment The carcinoma appears grade 3 and measures 14 mm in greatest linear extent.  Receptor Status: ER(0%), PR (0%), Her2-neu (negative), Ki-67(80%)  Did patient present with symptoms (if so, please note symptoms) or was this found on screening mammography?: Patient palpated a lump in the upper-inner right breast sometime late December 2020 or early January 2021.  As the mass grew larger and persisted through menstrual periods she brought it to medical attention and underwent bilateral diagnostic mammography with tomography and bilateral breast ultrasonography at [facility] on 09/26/2019 showing: breast density category C; 5.1 cm mass involving upper-inner quadrant of right breast; no evidence of right axillary lymphadenopathy or left breast malignancy.  Past/Anticipated interventions by surgeon, if any: Possibly breast conserving surgery, but nothing scheduled as of yet.  Past/Anticipated interventions by medical oncology, if any:  Under care of Dr. Sarajane Jews Magrinat:  (1) genetics testing pending (2) neoadjuvant chemotherapy to consist of cyclophosphamide and doxorubicin in dose dense fashion x4 followed by paclitaxel and carboplatin weekly x12 (3) definitive surgery to follow (4) adjuvant radiation  Lymphedema issues, if any:  None    Pain issues, if any:  Occasional discomfort where lump is in breast. Resolves on its own   SAFETY ISSUES:  Prior radiation? No  Pacemaker/ICD? No  Possible current pregnancy? No  Is the patient on methotrexate? No  Current Complaints / other details:   Patient and her family have a trip planned between 11/01/2019 and 11/04/2019. Otherwise no other  questions/concerns at this time.    Zola Button, RN 10/18/2019,4:18 PM

## 2019-10-19 ENCOUNTER — Encounter: Payer: Self-pay | Admitting: Genetic Counselor

## 2019-10-19 ENCOUNTER — Other Ambulatory Visit: Payer: Self-pay | Admitting: Oncology

## 2019-10-19 ENCOUNTER — Telehealth: Payer: Self-pay | Admitting: Oncology

## 2019-10-19 ENCOUNTER — Encounter: Payer: Self-pay | Admitting: *Deleted

## 2019-10-19 DIAGNOSIS — Z801 Family history of malignant neoplasm of trachea, bronchus and lung: Secondary | ICD-10-CM | POA: Insufficient documentation

## 2019-10-19 DIAGNOSIS — Z803 Family history of malignant neoplasm of breast: Secondary | ICD-10-CM | POA: Insufficient documentation

## 2019-10-19 DIAGNOSIS — Z8049 Family history of malignant neoplasm of other genital organs: Secondary | ICD-10-CM | POA: Insufficient documentation

## 2019-10-19 LAB — GENETIC SCREENING ORDER

## 2019-10-19 NOTE — Progress Notes (Signed)
REFERRING PROVIDER: Erroll Luna, MD [address] [place],  [place] [address]  PRIMARY PROVIDER:  Patient, No Pcp Per  PRIMARY REASON FOR VISIT:  1. Malignant neoplasm of upper-inner quadrant of right breast in female, estrogen receptor negative ([place])   2. Family history of breast cancer   3. Family history of lung cancer   4. Family history of cervical cancer      HISTORY OF PRESENT ILLNESS:   Ms. Krista [name], a 44 y.o. female, was seen for a [place] cancer genetics consultation at the request of Dr. Brantley Stage due to a personal and family history of young-onset breast cancer.  Ms. Krista [name] presents to clinic today to discuss the possibility of a hereditary predisposition to cancer, genetic testing, and to further clarify her future cancer risks, as well as potential cancer risks for family members.   In 2021, at the age of 76, Ms. 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 The treatment plan includes neoadjuvant chemotherapy, surgery, and adjuvant radiation therapy.   CANCER HISTORY:  Oncology History  Malignant neoplasm of upper-outer quadrant of right breast in female, estrogen receptor negative ([place]) (Resolved)  10/03/2019 Cancer Staging   Staging form: Breast, AJCC 8th Edition - Clinical stage from 10/03/2019: Stage IIIB (cT3, cN0, cM0, G3, ER-, PR-, HER2-) - Signed by Gardenia Phlegm, NP on 10/12/2019   10/12/2019 Initial Diagnosis   Malignant neoplasm of upper-outer quadrant of right breast in female, estrogen receptor negative ([place])   Malignant neoplasm of upper-inner quadrant of right breast in female, estrogen receptor negative ([place])  10/17/2019 Initial Diagnosis   Malignant neoplasm of upper-inner quadrant of right breast in female, estrogen receptor negative ([place])   10/17/2019 Cancer Staging   Staging form: Breast, AJCC 8th Edition - Clinical: Stage IIIB (cT3, cN0, cM0, G3, ER-, PR-, HER2-)  - Signed by Chauncey Cruel, MD on 10/17/2019   10/17/2019 -  Chemotherapy   The patient had DOXOrubicin (ADRIAMYCIN) chemo injection 120 mg, 60 mg/m2 = 120 mg, Intravenous,  Once, 0 of 4 cycles palonosetron (ALOXI) injection 0.25 mg, 0.25 mg, Intravenous,  Once, 0 of 16 cycles pegfilgrastim-jmdb (FULPHILA) injection 6 mg, 6 mg, Subcutaneous,  Once, 0 of 4 cycles CARBOplatin (PARAPLATIN) 280 mg in sodium chloride 0.9 % 100 mL chemo infusion, 280 mg (100 % of original dose 276 mg), Intravenous,  Once, 0 of 12 cycles Dose modification:   (original dose 276 mg, Cycle 5) cyclophosphamide (CYTOXAN) 1,200 mg in sodium chloride 0.9 % 250 mL chemo infusion, 600 mg/m2 = 1,200 mg, Intravenous,  Once, 0 of 4 cycles PACLitaxel (TAXOL) 162 mg in sodium chloride 0.9 % 250 mL chemo infusion (</= 51m/m2), 80 mg/m2 = 162 mg, Intravenous,  Once, 0 of 12 cycles fosaprepitant (EMEND) 150 mg in sodium chloride 0.9 % 145 mL IVPB, 150 mg, Intravenous,  Once, 0 of 16 cycles  for chemotherapy treatment.      RISK FACTORS:  Menarche was at age 44  First live birth at age 44  No OCP use; Mirena IUD 5 years Ovaries intact: yes.  Hysterectomy: no.  Menopausal status: premenopausal.  HRT use: 0 years. Colonoscopy: n/a. Mammogram within the last year: yes.   Past Medical History:  Diagnosis Date  . Anxiety   . Family history of breast cancer   . Family history of cervical cancer   . Family history of lung cancer   . Hemorrhoids  No past surgical history on file.  Social History   Socioeconomic History  . Marital status: Married    Spouse name: Not on file  . Number of children: Not on file  . Years of education: Not on file  . Highest education level: Not on file  Occupational History  . Not on file  Tobacco Use  . Smoking status: Not on file  Substance and Sexual Activity  . Alcohol use: Not on file  . Drug use: Not on file  . Sexual activity: Not on file  Other Topics Concern  . Not  on file  Social History Narrative  . Not on file   Social Determinants of Health   Financial Resource Strain:   . Difficulty of Paying Living Expenses:   Food Insecurity:   . Worried About Charity fundraiser in the Last Year:   . Arboriculturist in the Last Year:   Transportation Needs:   . Film/video editor (Medical):   Marland Kitchen Lack of Transportation (Non-Medical):   Physical Activity:   . Days of Exercise per Week:   . Minutes of Exercise per Session:   Stress:   . Feeling of Stress :   Social Connections:   . Frequency of Communication with Friends and Family:   . Frequency of Social Gatherings with Friends and Family:   . Attends Religious Services:   . Active Member of Clubs or Organizations:   . Attends Archivist Meetings:   Marland Kitchen Marital Status:      FAMILY HISTORY:  We obtained a detailed, 4-generation family history.  Significant diagnoses are listed below: Family History  Problem Relation Age of Onset  . Breast cancer Mother 77       double mastectomy  . Breast cancer Half-Sister 64  . Cirrhosis Half-Sister   . Heart Problems Father   . Stroke Father   . Breast cancer Maternal Aunt 25  . Cancer Maternal Uncle 49       unknown type  . Cervical cancer Maternal Grandmother        dx. in her mid-40s  . Lung cancer Paternal Grandmother   . Cancer Other        unknown type; maternal great-uncles/aunts (x3)  . Diabetes Half-Brother   . Cancer Other        unknown type; matenral great-uncles/aunt (x4)  . Cancer Maternal Great-grandfather        unknown type  . Breast cancer Cousin 59       paternal cousin   Ms. Krista [name] has one son (age 21) and two daughters (ages 19 and 60). She has two maternal half-sisters and one maternal half-brother. One maternal half-sister had breast cancer at the age of 70 and died at 63 from cirrhosis of the liver. Ms. Krista [name] also has one paternal half-sister and one paternal half-brother, neither of whom have had  cancer to her knowledge.  Ms. Krista [name]'s mother was diagnosed with breast cancer at the age of 13 and had a double mastectomy. She died at the age of 38 due to an infection of her port. Ms. Krista [name] has three maternal uncles and three maternal aunts. One aunt has a history of breast cancer diagnosed when she was 78. Another aunt died at the age of 67 and did not have cancer, and the third aunt was kidnapped at a young age. One uncle has cancer, although Ms. Krista [name] is unsure what type of cancer he  has. There is no cancer among maternal first cousins. Ms. Krista [name]'s maternal grandmother died in her mid-61s from cervical cancer. Her grandmother had 6 siblings, 52 of whom had cancer (unknown type) when they were older than 43. Ms. Krista [name]'s maternal great-grandfather (grandmother's father) died from cancer (unknown type) in his 63s or 59s. She does not have any information about her maternal grandfather.   Ms. Krista [name]'s father died at the age of 60 with heart problems and a stroke. She has two paternal aunts, one of whom was murdered in her mid-72s. She has a paternal first cousin who had breast cancer diagnosed in her early 32s. Her paternal grandmother died at the age of 53 from lung cancer that had spread to her spine. Her paternal grandmother had four brothers and three sisters. All four of these great-uncles and one great-aunt had cancer (unknown type). Her paternal grandfather died at the age of 37 and she is not sure if he ever had cancer.  Ms. Krista [name] is unaware of previous family history of genetic testing for hereditary cancer risks. Her maternal ancestors are of African ([place] and European ([place] descent, and paternal ancestors are of African ([place]) and [place] descent. There is no reported Ashkenazi Jewish ancestry. There is no known consanguinity.  GENETIC COUNSELING ASSESSMENT: Ms. Krista [name] is a 44 y.o. female with a personal and family history of early-onset breast cancer, which is somewhat suggestive of a hereditary cancer syndrome and predisposition to cancer. We, therefore, discussed and recommended the following at today's visit.   DISCUSSION: We discussed that approximately 5-10% of breast cancer is hereditary, with most cases associated with the BRCA1 and BRCA2 genes.  There are other genes that can be associated with hereditary breast cancer syndromes.  These include ATM, CHEK2, PALB2, etc.  We discussed that testing is beneficial for several reasons, including knowing about other cancer risks, identifying potential screening and risk-reduction options that may be appropriate, and to understand if other family members could be at risk for cancer and allow them to undergo genetic testing.   We reviewed the characteristics, features and inheritance patterns of hereditary cancer syndromes. We also discussed genetic testing, including the appropriate family members to test, the process of testing, insurance coverage, genetic discrimination, and turn-around-time for results. We discussed the implications of a negative, positive and/or variant of uncertain significant result. We recommended Ms. Krista [name] pursue genetic testing for the [employer].   The Multi-Cancer Panel offered by [facility] includes sequencing and/or deletion duplication testing of the following 85 genes: AIP, ALK, APC, ATM, AXIN2,BAP1,  BARD1, BLM, BMPR1A, BRCA1, BRCA2, BRIP1, CASR, CDC73, CDH1, CDK4, CDKN1B, CDKN1C, CDKN2A (p14ARF), CDKN2A (p16INK4a), CEBPA, CHEK2, CTNNA1, DICER1, DIS3L2, EGFR (c.2369C>T, p.Thr790Met variant only), EPCAM (Deletion/duplication testing only), FH, FLCN, GATA2, GPC3, GREM1 (Promoter region deletion/duplication testing only), HOXB13 (c.251G>A, p.Gly84Glu), HRAS, KIT, MAX, MEN1, MET, MITF (c.952G>A, p.Glu318Lys variant only), MLH1, MSH2, MSH3, MSH6, MUTYH, NBN, NF1, NF2,  NTHL1, PALB2, PDGFRA, PHOX2B, PMS2, POLD1, POLE, POT1, PRKAR1A, PTCH1, PTEN, RAD50, RAD51C, RAD51D, RB1, RECQL4, RET, RNF43, RUNX1, SDHAF2, SDHA (sequence changes only), SDHB, SDHC, SDHD, SMAD4, SMARCA4, SMARCB1, SMARCE1, STK11, SUFU, TERC, TERT, TMEM127, TP53, TSC1, TSC2, VHL, WRN and WT1.    Based on Ms. Krista [name]'s personal and family history of cancer, she meets medical criteria for genetic testing. Despite that she meets criteria, she may still have an out of pocket cost. We discussed that if her out of pocket cost for testing is over $  100, the laboratory will call and confirm whether she wants to proceed with testing.  If the out of pocket cost of testing is less than $100 she will be billed by the genetic testing laboratory.    PLAN: After considering the risks, benefits, and limitations, Ms. Krista [name] provided informed consent to pursue genetic testing and the blood sample was sent to [facility] for analysis of the Multi-Cancer Panel. Results should be available within approximately two-three weeks' time, at which point they will be disclosed by telephone to Ms. Krista [name], as will any additional recommendations warranted by these results. Ms. Krista [name] will receive a summary of her genetic counseling visit and a copy of her results once available. This information will also be available in Epic.   Ms. Krista [name]'s questions were answered to her satisfaction today. Our contact information was provided should additional questions or concerns arise. Thank you for the referral and allowing [place] to share in the care of your patient.   Clint Guy, [place], [facility] Licensed, Certified Dispensing [email or web address] Phone: [phone]  The patient was seen for a total of 60 minutes in face-to-face genetic counseling.  This patient was discussed with Drs. Magrinat, Lindi Adie and/or Burr Medico who agrees with the above.     _______________________________________________________________________ For Office Staff:  Number of people involved in session: 1 Was an Intern/ student involved with case: no

## 2019-10-19 NOTE — Telephone Encounter (Signed)
Scheduled appts per 4/19 los. Pt confirmed next appt date and time. Text pt a link to sign up for mychart to review other appointments per pt's request.

## 2019-10-20 ENCOUNTER — Telehealth: Payer: Self-pay | Admitting: Genetic Counselor

## 2019-10-20 ENCOUNTER — Other Ambulatory Visit: Payer: Self-pay

## 2019-10-20 ENCOUNTER — Other Ambulatory Visit: Payer: BC Managed Care – PPO

## 2019-10-20 ENCOUNTER — Encounter (HOSPITAL_BASED_OUTPATIENT_CLINIC_OR_DEPARTMENT_OTHER): Payer: Self-pay | Admitting: Surgery

## 2019-10-20 ENCOUNTER — Other Ambulatory Visit: Payer: Self-pay | Admitting: *Deleted

## 2019-10-20 ENCOUNTER — Encounter: Payer: Self-pay | Admitting: *Deleted

## 2019-10-20 DIAGNOSIS — C50411 Malignant neoplasm of upper-outer quadrant of right female breast: Secondary | ICD-10-CM

## 2019-10-20 DIAGNOSIS — Z171 Estrogen receptor negative status [ER-]: Secondary | ICD-10-CM

## 2019-10-20 NOTE — Telephone Encounter (Signed)
Returned Ms. [place] phone call regarding the cost of genetic testing. LVM briefly explaining the billing policy of the genetic testing laboratory ([facility]) and requested that she call back to discuss her payment/testing options.

## 2019-10-20 NOTE — Progress Notes (Signed)
Radiation Oncology         [phone] ________________________________  Initial outpatient Consultation by telephone.  The patient opted for telemedicine to maximize safety during the pandemic.  MyChart video was not obtainable.   Name: Krista [name] MRN: [id number]  Date: 10/21/2019  DOB: [birth year]  [id number], No Pcp Per  Erroll Luna, MD   REFERRING PHYSICIAN: Erroll Luna, MD  DIAGNOSIS:    ICD-10-CM   1. Malignant neoplasm of upper-inner quadrant of right breast in female, estrogen receptor negative ([place])  C50.211    Z17.1    Cancer Staging Malignant neoplasm of upper-inner quadrant of right breast in female, estrogen receptor negative ([place]) Staging form: Breast, AJCC 8th Edition - Clinical: Stage IIIB (cT3, cN0, cM0, G3, ER-, PR-, HER2-) - Signed by Chauncey Cruel, MD on 10/17/2019   CHIEF COMPLAINT: Here to discuss management of right breast cancer  HISTORY OF PRESENT ILLNESS::Krista [name] Malady is a 44 y.o. female who presented with a palpable upper-inner right breast lump.  Other symptoms, if any, at that time, were: none.   Ultrasound of breast on 09/26/2019 revealed 5.1 cm mass involving upper-inner right breast.   Axilla was negative.  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  ER status: 0%; PR status 0%, Her2 status negative; Grade 3.  She met with Dr. Jana Hakim on 10/17/2019. She is planning to be out of town between 11/01/2019 and 11/04/2019. As such, she is being scheduled for port placement and starting chemotherapy consisting of doxorubicin and cyclophosphamide.  MRI of breasts and staging scans are pending.  She is status post consultation with genetic counseling.  PREVIOUS RADIATION THERAPY: No  PAST MEDICAL HISTORY:  has a past medical history of Anxiety, Family history of breast cancer, Family history of cervical cancer, Family history of lung cancer, and Hemorrhoids.    PAST SURGICAL HISTORY:History reviewed. No  pertinent surgical history.  FAMILY HISTORY: family history includes Breast cancer (age of onset: 33) in her mother; Breast cancer (age of onset: 6) in her half-sister; Breast cancer (age of onset: 19) in her maternal aunt; Breast cancer (age of onset: 56) in her cousin; Cancer in her maternal great-grandfather and other family members; Cancer (age of onset: 49) in her maternal uncle; Cervical cancer in her maternal grandmother; Cirrhosis in her half-sister; Diabetes in her half-brother; Heart Problems in her father; Lung cancer in her paternal grandmother; Stroke in her father.  SOCIAL HISTORY:  reports that she has never smoked. She has never used smokeless tobacco. She reports previous alcohol use. She reports that she does not use drugs.  ALLERGIES: Patient has no known allergies.  MEDICATIONS:  Current Outpatient Medications  Medication Sig Dispense Refill  . Daily Multiple Vitamins tablet Take 1 tablet by mouth daily.    Marland Kitchen dexamethasone (DECADRON) 4 MG tablet Take 2 tablets by mouth daily starting the day after Carboplatin and Cytoxan x 3 days. Take with food. (Patient not taking: Reported on 10/21/2019) 30 tablet 1  . lidocaine-prilocaine (EMLA) cream Apply to affected area once (Patient not taking: Reported on 10/21/2019) 30 g 3  . LORazepam (ATIVAN) 0.5 MG tablet Take 1 tablet (0.5 mg total) by mouth at bedtime as needed (Nausea or vomiting). (Patient not taking: Reported on 10/21/2019) 30 tablet 0  . prochlorperazine (COMPAZINE) 10 MG tablet Take 1 tablet (10 mg total) by mouth every 6 (six) hours as needed (Nausea or vomiting). (Patient not taking: Reported on 10/21/2019) 30 tablet 1  No current facility-administered medications for this encounter.    REVIEW OF SYSTEMS: As above   PHYSICAL EXAM:  vitals were not taken for this visit.   General: Alert and oriented, in no acute distress    LABORATORY DATA:  Lab Results  Component Value Date   WBC 6.9 10/17/2019   HGB 11.8 (L)  10/17/2019   HCT 34.3 (L) 10/17/2019   MCV 71.5 (L) 10/17/2019   PLT 269 10/17/2019   CMP     Component Value Date/Time   NA 141 10/17/2019 1529   K 4.0 10/17/2019 1529   CL 106 10/17/2019 1529   CO2 24 10/17/2019 1529   GLUCOSE 87 10/17/2019 1529   BUN 10 10/17/2019 1529   CREATININE 0.95 10/17/2019 1529   CALCIUM 8.9 10/17/2019 1529   PROT 7.8 10/17/2019 1529   ALBUMIN 4.0 10/17/2019 1529   AST 16 10/17/2019 1529   ALT 13 10/17/2019 1529   ALKPHOS 66 10/17/2019 1529   BILITOT 1.1 10/17/2019 1529   GFRNONAA >60 10/17/2019 1529   GFRAA >60 10/17/2019 1529         RADIOGRAPHY: US BREAST LTD UNI LEFT INC AXILLA  Result Date: 09/26/2019 CLINICAL DATA:  44 year old with an enlarging palpable lump in the UPPER INNER RIGHT breast which she states has been there for approximately 4-5 months. This is the patient's initial baseline mammogram. Family history of breast cancer in her mother and in her sister. EXAM: DIGITAL DIAGNOSTIC BILATERAL MAMMOGRAM WITH CAD AND TOMO LIMITED ULTRASOUND BILATERAL BREASTS COMPARISON:  None. ACR Breast Density Category c: The breast tissue is heterogeneously dense, which may obscure small masses. FINDINGS: Tomosynthesis and synthesized full field CC and MLO views of both breasts were obtained. Tomosynthesis and synthesized spot compression tangential view of the area of concern in the RIGHT breast was also obtained. RIGHT: There is a mass with irregular margins in the UPPER INNER QUADRANT at POSTERIOR depth, measuring just over 5 cm maximally, associated with architectural distortion. There are no associated suspicious calcifications. No suspicious findings elsewhere in the RIGHT breast. Targeted RIGHT breast ultrasound is performed, showing a large hypoechoic mass with irregular margins at the 1 o'clock position approximately 14 cm from nipple, measuring approximately 2.8 x 5.0 x 5.1 cm, demonstrating posterior acoustic shadowing and demonstrating internal power  Doppler flow. A linear structure extending from the margin of the mass within normal fibroglandular tissue may represent a contiguous duct filled with tumor. Sonographic evaluation of the RIGHT axilla demonstrates no pathologic lymphadenopathy. LEFT: Focal asymmetry involving the UPPER OUTER QUADRANT at POSTERIOR depth. I do not identify a discrete mass in this location. No suspicious findings elsewhere in the LEFT breast. Targeted LEFT breast ultrasound is performed, showing normal dense fibroglandular tissue in the [place]. Benign simple cysts are present, the largest at the 2 o'clock position approximately 6 cm from the nipple measuring approximately 4 x 6 x 5 mm. No suspicious solid mass or abnormal acoustic shadowing is identified. Mammographic images were processed with CAD. On correlative physical exam, there is a firm fixed mass measuring approximately 5-6 cm in the UPPER INNER QUADRANT of the RIGHT breast corresponding to what the patient is feeling. IMPRESSION: 1. Highly suspicious approximate 5.1 cm mass involving the UPPER INNER QUADRANT of the RIGHT breast. 2. No pathologic RIGHT axillary lymphadenopathy. 3. No mammographic or sonographic evidence of malignancy involving the LEFT breast. RECOMMENDATION: Ultrasound-guided core needle biopsy of the RIGHT breast mass. The ultrasound biopsy procedure was discussed with the patient  and her questions were answered. She wishes to proceed and the biopsy has been scheduled at her convenience. I have discussed the findings and recommendations with the patient. BI-RADS CATEGORY  5: Highly suggestive of malignancy. Electronically Signed   By: [name] M.D.   On: 09/26/2019 11:54   US BREAST LTD UNI RIGHT INC AXILLA  Result Date: 09/26/2019 CLINICAL DATA:  44 year old with an enlarging palpable lump in the UPPER INNER RIGHT breast which she states has been there for approximately 4-5 months. This is the patient's initial baseline mammogram.  Family history of breast cancer in her mother and in her sister. EXAM: DIGITAL DIAGNOSTIC BILATERAL MAMMOGRAM WITH CAD AND TOMO LIMITED ULTRASOUND BILATERAL BREASTS COMPARISON:  None. ACR Breast Density Category c: The breast tissue is heterogeneously dense, which may obscure small masses. FINDINGS: Tomosynthesis and synthesized full field CC and MLO views of both breasts were obtained. Tomosynthesis and synthesized spot compression tangential view of the area of concern in the RIGHT breast was also obtained. RIGHT: There is a mass with irregular margins in the UPPER INNER QUADRANT at POSTERIOR depth, measuring just over 5 cm maximally, associated with architectural distortion. There are no associated suspicious calcifications. No suspicious findings elsewhere in the RIGHT breast. Targeted RIGHT breast ultrasound is performed, showing a large hypoechoic mass with irregular margins at the 1 o'clock position approximately 14 cm from nipple, measuring approximately 2.8 x 5.0 x 5.1 cm, demonstrating posterior acoustic shadowing and demonstrating internal power Doppler flow. A linear structure extending from the margin of the mass within normal fibroglandular tissue may represent a contiguous duct filled with tumor. Sonographic evaluation of the RIGHT axilla demonstrates no pathologic lymphadenopathy. LEFT: Focal asymmetry involving the UPPER OUTER QUADRANT at POSTERIOR depth. I do not identify a discrete mass in this location. No suspicious findings elsewhere in the LEFT breast. Targeted LEFT breast ultrasound is performed, showing normal dense fibroglandular tissue in the [place]. Benign simple cysts are present, the largest at the 2 o'clock position approximately 6 cm from the nipple measuring approximately 4 x 6 x 5 mm. No suspicious solid mass or abnormal acoustic shadowing is identified. Mammographic images were processed with CAD. On correlative physical exam, there is a firm fixed mass measuring  approximately 5-6 cm in the UPPER INNER QUADRANT of the RIGHT breast corresponding to what the patient is feeling. IMPRESSION: 1. Highly suspicious approximate 5.1 cm mass involving the UPPER INNER QUADRANT of the RIGHT breast. 2. No pathologic RIGHT axillary lymphadenopathy. 3. No mammographic or sonographic evidence of malignancy involving the LEFT breast. RECOMMENDATION: Ultrasound-guided core needle biopsy of the RIGHT breast mass. The ultrasound biopsy procedure was discussed with the patient and her questions were answered. She wishes to proceed and the biopsy has been scheduled at her convenience. I have discussed the findings and recommendations with the patient. BI-RADS CATEGORY  5: Highly suggestive of malignancy. Electronically Signed   By: [name] M.D.   On: 09/26/2019 11:54   MM DIAG BREAST TOMO BILATERAL  Result Date: 09/26/2019 CLINICAL DATA:  44 year old with an enlarging palpable lump in the UPPER INNER RIGHT breast which she states has been there for approximately 4-5 months. This is the patient's initial baseline mammogram. Family history of breast cancer in her mother and in her sister. EXAM: DIGITAL DIAGNOSTIC BILATERAL MAMMOGRAM WITH CAD AND TOMO LIMITED ULTRASOUND BILATERAL BREASTS COMPARISON:  None. ACR Breast Density Category c: The breast tissue is heterogeneously dense, which may obscure small masses. FINDINGS: Tomosynthesis and  synthesized full field CC and MLO views of both breasts were obtained. Tomosynthesis and synthesized spot compression tangential view of the area of concern in the RIGHT breast was also obtained. RIGHT: There is a mass with irregular margins in the UPPER INNER QUADRANT at POSTERIOR depth, measuring just over 5 cm maximally, associated with architectural distortion. There are no associated suspicious calcifications. No suspicious findings elsewhere in the RIGHT breast. Targeted RIGHT breast ultrasound is performed, showing a large hypoechoic mass with  irregular margins at the 1 o'clock position approximately 14 cm from nipple, measuring approximately 2.8 x 5.0 x 5.1 cm, demonstrating posterior acoustic shadowing and demonstrating internal power Doppler flow. A linear structure extending from the margin of the mass within normal fibroglandular tissue may represent a contiguous duct filled with tumor. Sonographic evaluation of the RIGHT axilla demonstrates no pathologic lymphadenopathy. LEFT: Focal asymmetry involving the UPPER OUTER QUADRANT at POSTERIOR depth. I do not identify a discrete mass in this location. No suspicious findings elsewhere in the LEFT breast. Targeted LEFT breast ultrasound is performed, showing normal dense fibroglandular tissue in the [place]. Benign simple cysts are present, the largest at the 2 o'clock position approximately 6 cm from the nipple measuring approximately 4 x 6 x 5 mm. No suspicious solid mass or abnormal acoustic shadowing is identified. Mammographic images were processed with CAD. On correlative physical exam, there is a firm fixed mass measuring approximately 5-6 cm in the UPPER INNER QUADRANT of the RIGHT breast corresponding to what the patient is feeling. IMPRESSION: 1. Highly suspicious approximate 5.1 cm mass involving the UPPER INNER QUADRANT of the RIGHT breast. 2. No pathologic RIGHT axillary lymphadenopathy. 3. No mammographic or sonographic evidence of malignancy involving the LEFT breast. RECOMMENDATION: Ultrasound-guided core needle biopsy of the RIGHT breast mass. The ultrasound biopsy procedure was discussed with the patient and her questions were answered. She wishes to proceed and the biopsy has been scheduled at her convenience. I have discussed the findings and recommendations with the patient. BI-RADS CATEGORY  5: Highly suggestive of malignancy. Electronically Signed   By: [name] M.D.   On: 09/26/2019 11:54   ECHOCARDIOGRAM COMPLETE  Result Date: 10/24/2019    ECHOCARDIOGRAM  REPORT   Patient Name:   Detra ESTER M [name] Date of Exam: 10/24/2019 Medical Rec #:  [id number]                  Height:       67.0 in Accession #:    [id number]                 Weight:       211.0 lb Date of Birth:  06-16-1976                  BSA:          2.069 m Patient Age:    21 years                   BP:           154/108 mmHg Patient Gender: F                          HR:           79 bpm. Exam Location:  Outpatient Procedure: 2D Echo Indications:    Chemo V67.2 / Z09  History:        Patient has no prior history  of Echocardiogram examinations.                 Breast cancer.  Sonographer:    Vikki Ports Turrentine Referring Phys: Elsie  1. Left ventricular ejection fraction, by estimation, is 60 to 65%. The left ventricle has normal function. The left ventricle has no regional wall motion abnormalities. Left ventricular diastolic parameters are consistent with Grade I diastolic dysfunction (impaired relaxation). The average left ventricular global longitudinal strain is -21.6 %. The global longitudinal strain is normal.  2. Lateral annulus velocity: 16.4 cm/s. Right ventricular systolic function is normal. The right ventricular size is normal.  3. The mitral valve is normal in structure. Trivial mitral valve regurgitation. No evidence of mitral stenosis.  4. The aortic valve is normal in structure. Aortic valve regurgitation is not visualized. No aortic stenosis is present.  5. The inferior vena cava is normal in size with greater than 50% respiratory variability, suggesting right atrial pressure of 3 mmHg. FINDINGS  Left Ventricle: Left ventricular ejection fraction, by estimation, is 60 to 65%. The left ventricle has normal function. The left ventricle has no regional wall motion abnormalities. The average left ventricular global longitudinal strain is -21.6 %. The global longitudinal strain is normal. The left ventricular internal cavity size was normal in size. There is no  left ventricular hypertrophy. Left ventricular diastolic parameters are consistent with Grade I diastolic dysfunction (impaired relaxation). Right Ventricle: Lateral annulus velocity: 16.4 cm/s. The right ventricular size is normal. No increase in right ventricular wall thickness. Right ventricular systolic function is normal. Left Atrium: Left atrial size was normal in size. Right Atrium: Right atrial size was normal in size. Pericardium: There is no evidence of pericardial effusion. Mitral Valve: The mitral valve is normal in structure. Normal mobility of the mitral valve leaflets. Trivial mitral valve regurgitation. No evidence of mitral valve stenosis. Tricuspid Valve: The tricuspid valve is normal in structure. Tricuspid valve regurgitation is trivial. No evidence of tricuspid stenosis. Aortic Valve: The aortic valve is normal in structure. Aortic valve regurgitation is not visualized. No aortic stenosis is present. Aortic valve mean gradient measures 5.0 mmHg. Aortic valve peak gradient measures 9.5 mmHg. Aortic valve area, by VTI measures 2.78 cm. Pulmonic Valve: The pulmonic valve was normal in structure. Pulmonic valve regurgitation is not visualized. No evidence of pulmonic stenosis. Aorta: The aortic root is normal in size and structure. Venous: The inferior vena cava is normal in size with greater than 50% respiratory variability, suggesting right atrial pressure of 3 mmHg. IAS/Shunts: No atrial level shunt detected by color flow Doppler.  LEFT VENTRICLE PLAX 2D LVIDd:         3.90 cm  Diastology LVIDs:         2.40 cm  LV e' lateral:   6.96 cm/s LV PW:         1.10 cm  LV E/e' lateral: 14.8 LV IVS:        1.10 cm  LV e' medial:    5.87 cm/s LVOT diam:     2.00 cm  LV E/e' medial:  17.5 LV SV:         78 LV SV Index:   38       2D Longitudinal Strain LVOT Area:     3.14 cm 2D Strain GLS Avg:     -21.6 %  RIGHT VENTRICLE RV S prime:     16.40 cm/s TAPSE (M-mode): 2.1 cm LEFT ATRIUM  Index        RIGHT ATRIUM           Index LA diam:        4.20 cm 2.03 cm/m  RA Area:     17.40 cm LA Vol (A2C):   65.5 ml 31.66 ml/m RA Volume:   42.70 ml  20.64 ml/m LA Vol (A4C):   61.9 ml 29.92 ml/m LA Biplane Vol: 64.6 ml 31.23 ml/m  AORTIC VALVE AV Area (Vmax):    2.35 cm AV Area (Vmean):   2.52 cm AV Area (VTI):     2.78 cm AV Vmax:           154.00 cm/s AV Vmean:          110.000 cm/s AV VTI:            0.279 m AV Peak Grad:      9.5 mmHg AV Mean Grad:      5.0 mmHg LVOT Vmax:         115.00 cm/s LVOT Vmean:        88.300 cm/s LVOT VTI:          0.247 m LVOT/AV VTI ratio: 0.89  AORTA Ao Root diam: 3.20 cm MITRAL VALVE MV Area (PHT): 3.50 cm     SHUNTS MV Decel Time: 217 msec     Systemic VTI:  0.25 m MV E velocity: 103.00 cm/s  Systemic Diam: 2.00 cm MV A velocity: 109.00 cm/s MV E/A ratio:  0.94 Candee Furbish MD Electronically signed by Candee Furbish MD Signature Date/Time: 10/24/2019/12:16:00 PM    Final    US THYROID  Result Date: 09/28/2019 CLINICAL DATA:  Thyroid nodule EXAM: THYROID ULTRASOUND TECHNIQUE: Ultrasound examination of the thyroid gland and adjacent soft tissues was performed. COMPARISON:  None. FINDINGS: Parenchymal Echotexture: Mildly heterogenous Isthmus: 0.4 cm Right lobe: 4.7 x 1.4 x 2.5 cm Left lobe: 6.3 x 1.7 x 2.2 cm _________________________________________________________ Estimated total number of nodules >/= 1 cm: 1 Number of spongiform nodules >/=  2 cm not described below (TR1): 0 Number of mixed cystic and solid nodules >/= 1.5 cm not described below (TR2): 0 _________________________________________________________ There is a 0.9 cm isoechoic thyroid nodule in the left mid thyroid gland. No further follow-up is required for this nodule. Nodule # 2: Location: Left; Inferior Maximum size: 1 cm; Other 2 dimensions: 1 x 0.9 cm Composition: solid/almost completely solid (2) Echogenicity: hypoechoic (2) Shape: not taller-than-wide (0) Margins: smooth (0) Echogenic foci: none (0) ACR  TI-RADS total points: 4. ACR TI-RADS risk category: TR4 (4-6 points). ACR TI-RADS recommendations: *Given size (>/= 1 - 1.4 cm) and appearance, a follow-up ultrasound in 1 year should be considered based on TI-RADS criteria. _________________________________________________________ There are no worrisome cervical lymph nodes identified on this study. IMPRESSION: 1. Mildly heterogeneous and mild asymmetric enlargement of the thyroid gland as measured above. 2. There is a 1 cm TR4 thyroid nodule in the left inferior thyroid gland. A 1 year follow-up ultrasound is recommended for this thyroid nodule. The above is in keeping with the ACR TI-RADS recommendations - J Am Coll Radiol 2017;14:587-595. Electronically Signed   By: Constance Holster M.D.   On: 09/28/2019 17:49   MM CLIP PLACEMENT RIGHT  Result Date: 10/03/2019 CLINICAL DATA:  Status post ultrasound-guided core needle biopsy of a right breast mass. EXAM: DIAGNOSTIC RIGHT MAMMOGRAM POST ULTRASOUND BIOPSY COMPARISON:  Previous exam(s). FINDINGS: Mammographic images were obtained following ultrasound guided biopsy of a right breast mass.  The biopsy marking clip is in expected position at the site of biopsy. IMPRESSION: Appropriate positioning of the ribbon shaped biopsy marking clip at the site of biopsy in the within the central aspect of the upper inner quadrant mass. Final Assessment: Post Procedure Mammograms for Marker Placement Electronically Signed   By: Lajean Manes M.D.   On: 10/03/2019 14:45   [place] RT BREAST BX W LOC DEV 1ST LESION IMG BX SPEC US GUIDE  Addendum Date: 10/05/2019   ADDENDUM REPORT: 10/04/2019 17:22 ADDENDUM: Pathology revealed GRADE III INVASIVE DUCTAL CARCINOMA of the RIGHT breast, 1 o'clock, 14cm from nipple. This was found to be concordant by Dr. Lajean Manes. Pathology results were discussed with the patient by telephone. The patient reported doing well after the biopsy with tenderness at the site. Post biopsy instructions and  care were reviewed and questions were answered. The patient was encouraged to call [facility] for any additional concerns. Surgical consultation has been arranged with Dr. Erroll Luna at [facility] on October 12, 2019. Pathology results reported by Stacie Acres RN on 10/04/2019. Electronically Signed   By: Lajean Manes M.D.   On: 10/04/2019 17:22   Result Date: 10/05/2019 CLINICAL DATA:  Patient presents for ultrasound-guided core needle biopsy of a 5.1 cm, upper quadrant, palpable right breast mass. EXAM: ULTRASOUND GUIDED RIGHT BREAST CORE NEEDLE BIOPSY COMPARISON:  Previous exam(s). PROCEDURE: I met with the patient and we discussed the procedure of ultrasound-guided biopsy, including benefits and alternatives. We discussed the high likelihood of a successful procedure. We discussed the risks of the procedure, including infection, bleeding, tissue injury, clip migration, and inadequate sampling. Informed written consent was given. The usual time-out protocol was performed immediately prior to the procedure. Lesion quadrant: Upper inner quadrant Using sterile technique and 1% Lidocaine as local anesthetic, under direct ultrasound visualization, a 12 gauge spring-loaded device was used to perform biopsy of the 1 o'clock position right breast mass using an inferior medial approach. At the conclusion of the procedure ribbon shaped tissue marker clip was deployed into the biopsy cavity. Follow up 2 view mammogram was performed and dictated separately. IMPRESSION: Ultrasound guided biopsy of a right breast mass. No apparent complications. Electronically Signed: By: Lajean Manes M.D. On: 10/03/2019 14:34      IMPRESSION/PLAN: This is a delightful 44 year old woman with right Breast Cancer   It was a pleasure meeting the patient today. We discussed the risks, benefits, and side effects of radiotherapy. I recommend radiotherapy to the right breast (and possibly regional  nodes) to reduce her risk of locoregional recurrence by 2/3.  We discussed that radiation would take approximately 6 weeks to complete and that I would give the patient a few weeks to heal following surgery before starting treatment planning.  First, she will be receiving neoadjuvant chemotherapy.    We spoke about acute effects including skin irritation and fatigue as well as much less common late effects including internal organ injury or irritation. We spoke about the latest technology that is used to minimize the risk of late effects for patients undergoing radiotherapy to the breast or chest wall. No guarantees of treatment were given. The patient is enthusiastic about proceeding with treatment. I look forward to participating in the patient's care.  I will await her referral back to me for postoperative follow-up and eventual CT simulation/treatment planning.  We discussed measures to reduce the risk of infection during the COVID-19 pandemic.  We discussed my recommendations that  she received the COVID vaccine as soon as possible.  I offered to help her schedule an appointment for this.  We talked about the risks side effects and benefits of the vaccine; at this point in time, she does not feel comfortable with proceeding with the Covid vaccine and she will continue to take other precautions to avoid infection.  She knows to contact me if she would like help scheduling a vaccine in the future.  I wished her the best until we meet in my clinic for treatment planning.  This encounter was provided by telemedicine platform by telephone.  The patient opted for telemedicine to maximize safety during the pandemic.  MyChart video was not obtainable. The patient has given verbal consent for this type of encounter and has been advised to only accept a meeting of this type in a secure network environment. On date of service, in total, I spent 45 minutes on this encounter. The attendants for this meeting  include Eppie Gibson  and [place].  During the encounter, Eppie Gibson was located at [facility].  Krista [name] was located at home.     __________________________________________   Eppie Gibson, MD   This document serves as a record of services personally performed by Eppie Gibson, MD. It was created on her behalf by Wilburn Mylar, a trained medical scribe. The creation of this record is based on the scribe's personal observations and the provider's statements to them. This document has been checked and approved by the attending provider.

## 2019-10-21 ENCOUNTER — Telehealth: Payer: Self-pay | Admitting: *Deleted

## 2019-10-21 ENCOUNTER — Ambulatory Visit
Admission: RE | Admit: 2019-10-21 | Discharge: 2019-10-21 | Disposition: A | Discharge status: Home / Self Care | Payer: BC Managed Care – PPO | Source: Ambulatory Visit | Attending: Radiation Oncology | Admitting: Radiation Oncology

## 2019-10-21 ENCOUNTER — Other Ambulatory Visit: Payer: Self-pay

## 2019-10-21 ENCOUNTER — Encounter: Payer: Self-pay | Admitting: Radiation Oncology

## 2019-10-21 ENCOUNTER — Other Ambulatory Visit (HOSPITAL_COMMUNITY): Payer: Self-pay

## 2019-10-21 ENCOUNTER — Encounter: Payer: Self-pay | Admitting: *Deleted

## 2019-10-21 DIAGNOSIS — C50211 Malignant neoplasm of upper-inner quadrant of right female breast: Secondary | ICD-10-CM

## 2019-10-21 DIAGNOSIS — Z171 Estrogen receptor negative status [ER-]: Secondary | ICD-10-CM

## 2019-10-21 NOTE — Telephone Encounter (Signed)
Late entry 10/20/19- Spoke to pt regarding navigation resources. Pt relayed she does not want port prior to her trip planned prior to her trip with her son as her mother passed from port infection and she would be on a plane during covid. Pt would like to have port placed same day as chemo if possible. Spoke to IR, port can be placed on 5/10 and verified with infusion charge nurse that chemo can be infused after recovery. Confirmed appts for 5/10 for port placement at [facility] arrive at 10am.

## 2019-10-21 NOTE — Telephone Encounter (Signed)
Spoke to pt regarding [employer] guide as well as nutrition consult made and materials will be provided during chemo education class on 4/26.

## 2019-10-21 NOTE — Telephone Encounter (Signed)
Spoke to [employer] in [employer] [facility] regarding need for covid testing prior to port placement. Per Tiffany, no covid test needed. Called pt and informed of the above and cx covid testing for 4/23. Encourage pt to call with needs or questions. Received verbal understanding.

## 2019-10-24 ENCOUNTER — Other Ambulatory Visit: Payer: Self-pay

## 2019-10-24 ENCOUNTER — Ambulatory Visit (HOSPITAL_COMMUNITY)
Admission: RE | Admit: 2019-10-24 | Discharge: 2019-10-24 | Disposition: A | Discharge status: Home / Self Care | Payer: BC Managed Care – PPO | Source: Ambulatory Visit | Attending: Oncology | Admitting: Oncology

## 2019-10-24 ENCOUNTER — Inpatient Hospital Stay: Payer: BC Managed Care – PPO

## 2019-10-24 ENCOUNTER — Other Ambulatory Visit: Payer: Self-pay | Admitting: Oncology

## 2019-10-24 ENCOUNTER — Telehealth: Payer: Self-pay | Admitting: Licensed Clinical Social Worker

## 2019-10-24 ENCOUNTER — Encounter: Payer: Self-pay | Admitting: Radiation Oncology

## 2019-10-24 DIAGNOSIS — Z01818 Encounter for other preprocedural examination: Secondary | ICD-10-CM | POA: Insufficient documentation

## 2019-10-24 DIAGNOSIS — D563 Thalassemia minor: Secondary | ICD-10-CM

## 2019-10-24 DIAGNOSIS — C50411 Malignant neoplasm of upper-outer quadrant of right female breast: Secondary | ICD-10-CM | POA: Insufficient documentation

## 2019-10-24 DIAGNOSIS — Z7189 Other specified counseling: Secondary | ICD-10-CM

## 2019-10-24 DIAGNOSIS — Z171 Estrogen receptor negative status [ER-]: Secondary | ICD-10-CM | POA: Insufficient documentation

## 2019-10-24 DIAGNOSIS — C50211 Malignant neoplasm of upper-inner quadrant of right female breast: Secondary | ICD-10-CM | POA: Insufficient documentation

## 2019-10-24 NOTE — Telephone Encounter (Signed)
Krista [name]  Clinical Social [name] was referred by Art therapist, Marine scientist, for assessment of psychosocial needs related to adjustment to cancer diagnosis.  Clinical Social Worker left message for patient  with direct call back information to assess further for needs.      Aleksey Newbern, [place], [place] Worker [facility]

## 2019-10-24 NOTE — Progress Notes (Signed)
  Echocardiogram 2D Echocardiogram has been performed.  Loucille Takach A Faolan [place] 10/24/2019, 11:45 AM

## 2019-10-25 ENCOUNTER — Ambulatory Visit (HOSPITAL_BASED_OUTPATIENT_CLINIC_OR_DEPARTMENT_OTHER): Admission: RE | Admit: 2019-10-25 | Payer: BC Managed Care – PPO | Source: Home / Self Care | Admitting: Surgery

## 2019-10-25 ENCOUNTER — Ambulatory Visit (HOSPITAL_BASED_OUTPATIENT_CLINIC_OR_DEPARTMENT_OTHER): Admit: 2019-10-25 | Payer: BC Managed Care – PPO | Admitting: Surgery

## 2019-10-25 ENCOUNTER — Encounter (HOSPITAL_BASED_OUTPATIENT_CLINIC_OR_DEPARTMENT_OTHER): Payer: Self-pay

## 2019-10-25 SURGERY — INSERTION, TUNNELED CENTRAL VENOUS DEVICE, WITH PORT
Anesthesia: General

## 2019-10-27 ENCOUNTER — Other Ambulatory Visit: Payer: Self-pay | Admitting: Oncology

## 2019-10-27 ENCOUNTER — Ambulatory Visit
Admission: RE | Admit: 2019-10-27 | Discharge: 2019-10-27 | Disposition: A | Discharge status: Home / Self Care | Payer: BC Managed Care – PPO | Source: Ambulatory Visit | Attending: Oncology | Admitting: Oncology

## 2019-10-27 DIAGNOSIS — Z171 Estrogen receptor negative status [ER-]: Secondary | ICD-10-CM

## 2019-10-27 DIAGNOSIS — C50411 Malignant neoplasm of upper-outer quadrant of right female breast: Secondary | ICD-10-CM

## 2019-10-27 DIAGNOSIS — D563 Thalassemia minor: Secondary | ICD-10-CM

## 2019-10-27 DIAGNOSIS — C50211 Malignant neoplasm of upper-inner quadrant of right female breast: Secondary | ICD-10-CM

## 2019-10-27 MED ORDER — GADOBUTROL 1 MMOL/ML IV SOLN
9.0000 mL | Freq: Once | INTRAVENOUS | Status: AC | PRN
  Administered 2019-10-27: 9 mL via INTRAVENOUS

## 2019-10-28 ENCOUNTER — Encounter: Payer: Self-pay | Admitting: *Deleted

## 2019-10-28 ENCOUNTER — Other Ambulatory Visit: Payer: Self-pay | Admitting: Oncology

## 2019-10-28 DIAGNOSIS — C50211 Malignant neoplasm of upper-inner quadrant of right female breast: Secondary | ICD-10-CM

## 2019-10-28 DIAGNOSIS — Z171 Estrogen receptor negative status [ER-]: Secondary | ICD-10-CM

## 2019-10-28 NOTE — Progress Notes (Signed)
I called patient to let her know that additional biopsies are recommended following her recent MRI.  I informed her that she will be notified by the radiology scheduler to schedule biopsies.  Patient's questions answered.  Patient encouraged to call for any other questions or needs.

## 2019-10-31 ENCOUNTER — Encounter (HOSPITAL_COMMUNITY)
Admission: RE | Admit: 2019-10-31 | Discharge: 2019-10-31 | Disposition: A | Discharge status: Home / Self Care | Payer: BC Managed Care – PPO | Source: Ambulatory Visit | Attending: Oncology | Admitting: Oncology

## 2019-10-31 ENCOUNTER — Encounter (HOSPITAL_COMMUNITY): Payer: Self-pay

## 2019-10-31 ENCOUNTER — Other Ambulatory Visit: Payer: Self-pay

## 2019-10-31 ENCOUNTER — Ambulatory Visit (HOSPITAL_COMMUNITY)
Admission: RE | Admit: 2019-10-31 | Discharge: 2019-10-31 | Disposition: A | Discharge status: Home / Self Care | Payer: BC Managed Care – PPO | Source: Ambulatory Visit | Attending: Oncology | Admitting: Oncology

## 2019-10-31 DIAGNOSIS — Z171 Estrogen receptor negative status [ER-]: Secondary | ICD-10-CM | POA: Insufficient documentation

## 2019-10-31 DIAGNOSIS — C50411 Malignant neoplasm of upper-outer quadrant of right female breast: Secondary | ICD-10-CM

## 2019-10-31 DIAGNOSIS — C50211 Malignant neoplasm of upper-inner quadrant of right female breast: Secondary | ICD-10-CM

## 2019-10-31 DIAGNOSIS — D563 Thalassemia minor: Secondary | ICD-10-CM

## 2019-10-31 MED ORDER — SODIUM CHLORIDE (PF) 0.9 % IJ SOLN
INTRAMUSCULAR | Status: AC
  Filled 2019-10-31: qty 50

## 2019-10-31 MED ORDER — IOHEXOL 9 MG/ML PO SOLN
ORAL | Status: AC
  Filled 2019-10-31: qty 1000

## 2019-10-31 MED ORDER — IOHEXOL 9 MG/ML PO SOLN
1000.0000 mL | ORAL | Status: AC
  Administered 2019-10-31: 1000 mL via ORAL

## 2019-10-31 MED ORDER — IOHEXOL 300 MG/ML  SOLN
100.0000 mL | Freq: Once | INTRAMUSCULAR | Status: AC | PRN
  Administered 2019-10-31: 100 mL via INTRAVENOUS

## 2019-10-31 MED ORDER — TECHNETIUM TC 99M MEDRONATE IV KIT
21.0000 | PACK | Freq: Once | INTRAVENOUS | Status: AC | PRN
  Administered 2019-10-31: 21 via INTRAVENOUS

## 2019-11-01 ENCOUNTER — Encounter: Payer: Self-pay | Admitting: *Deleted

## 2019-11-01 ENCOUNTER — Telehealth: Payer: Self-pay | Admitting: Genetic Counselor

## 2019-11-01 NOTE — Progress Notes (Signed)
Pharmacist Chemotherapy Monitoring - Initial Assessment    Anticipated start date: 11/07/19  Regimen:  . Are orders appropriate based on the patient's diagnosis, regimen, and cycle? Yes . Does the plan date match the patient's scheduled date? Yes . Is the sequencing of drugs appropriate? Yes . Are the premedications appropriate for the patient's regimen? Yes . Prior Authorization for treatment is: Not Started o If applicable, is the correct biosimilar selected based on the patient's insurance? not applicable  Organ Function and Labs: Marland Kitchen Are dose adjustments needed based on the patient's renal function, hepatic function, or hematologic function? No . Are appropriate labs ordered prior to the start of patient's treatment? Yes . Other organ system assessment, if indicated: anthracyclines: Echo/ MUGA . The following baseline labs, if indicated, have been ordered: N/A  Dose Assessment: . Are the drug doses appropriate? Yes . Are the following correct: o Drug concentrations Yes o IV fluid compatible with drug Yes o Administration routes Yes o Timing of therapy Yes . If applicable, does the patient have documented access for treatment and/or plans for port-a-cath placement? Yes - 5/10 sched. . If applicable, have lifetime cumulative doses been properly documented and assessed? not applicable Lifetime Dose Tracking  No doses have been documented on this patient for the following tracked chemicals: Doxorubicin, Epirubicin, Idarubicin, Daunorubicin, Mitoxantrone, Bleomycin, Oxaliplatin, Carboplatin, Liposomal Doxorubicin  o   Toxicity Monitoring/Prevention: . The patient has the following take home antiemetics prescribed: Prochlorperazine, Dexamethasone and Lorazepam . The patient has the following take home medications prescribed: N/A . Medication allergies and previous infusion related reactions, if applicable, have been reviewed and addressed. Yes . The patient's current medication list has  been assessed for drug-drug interactions with their chemotherapy regimen. no significant drug-drug interactions were identified on review.  Order Review: . Are the treatment plan orders signed? No . Is the patient scheduled to see a provider prior to their treatment? Yes  I verify that I have reviewed each item in the above checklist and answered each question accordingly.    Kennith Center, Pharm.D., CPP 11/01/2019@2 :50 PM

## 2019-11-01 NOTE — Telephone Encounter (Signed)
LVM that her genetic test results are available and requested that she call back to discuss them.  

## 2019-11-02 ENCOUNTER — Other Ambulatory Visit: Payer: Self-pay | Admitting: Oncology

## 2019-11-02 ENCOUNTER — Encounter: Payer: Self-pay | Admitting: Oncology

## 2019-11-02 DIAGNOSIS — C50211 Malignant neoplasm of upper-inner quadrant of right female breast: Secondary | ICD-10-CM

## 2019-11-02 DIAGNOSIS — Z171 Estrogen receptor negative status [ER-]: Secondary | ICD-10-CM

## 2019-11-03 ENCOUNTER — Encounter: Payer: Self-pay | Admitting: *Deleted

## 2019-11-04 ENCOUNTER — Other Ambulatory Visit (HOSPITAL_COMMUNITY): Payer: BC Managed Care – PPO

## 2019-11-04 ENCOUNTER — Other Ambulatory Visit: Payer: Self-pay | Admitting: Radiology

## 2019-11-04 ENCOUNTER — Ambulatory Visit (HOSPITAL_COMMUNITY): Payer: BC Managed Care – PPO

## 2019-11-04 ENCOUNTER — Encounter (HOSPITAL_COMMUNITY): Payer: BC Managed Care – PPO

## 2019-11-06 NOTE — Progress Notes (Signed)
[place]  [phone] [phone]     ID: Krista [name] DOB: 22-Sep-1975  MR#: [id number]  LKG#:[id number]  Patient Care Team: Patient, No Pcp Per as PCP - General (General Practice) Janeli Lewison, [name], MD as Consulting Physician (Oncology) Erroll Luna, MD as Consulting Physician (General Surgery) Eppie Gibson, MD as Attending Physician (Radiation Oncology) Rockwell Germany, RN as Oncology Nurse Navigator Mauro Kaufmann, RN as Oncology Nurse Navigator Chauncey Cruel, MD OTHER MD:  CHIEF COMPLAINT: Triple negative breast cancer  CURRENT TREATMENT: Neoadjuvant chemotherapy   INTERVAL HISTORY: Krista [name] returns today for follow up of her triple negative breast cancer accompanied by her husband Krista [name]. She was evaluated in the breast cancer clinic on 10/17/2019.   She underwent genetic counseling on 10/18/2019. Her results returned on 11/01/2019 but they were not available today to share with her.  The genetics counselor, Raquel Sarna, will be calling her  She underwent echocardiogram on 10/24/2019 showing an ejection fraction of 60-65%.   She underwent breast MRI on 10/27/2019 showing: breast composition C; biopsy-proven upper-inner right breast malignancy with small adjacent satellite masses, 7.9 cm; suspicious 3.5 cm enhancement in lower-inner right breast, slightly inferior to dominant mass; abnormal right axillary lymph node; no evidence of left breast malignancy.  She also underwent CT chest, abdomen, pelvis on 10/31/2019 showing: large right breast mass with metastatic right axillary lymphadenopathy; ill-defined low-attenuation lesion in segment 4 of liver; lateral left breast nodule; enlarged, heterogeneous and fibroid uterus; nonspecific tiny subpleural pulmonary nodules.  Bone scan performed the same day was negative for osseous metastatic disease.  She had her port placed this morning.  She is scheduled for liver MRI on 11/11/2019 to  further evaluate the lesion seen on CT.   REVIEW OF SYSTEMS: Krista [name] was supposed to start her chemo today, right after port placement.  However the port placement turned out to be more complicated than expected and she did not come to the office until 330.  This is too late to start chemo.  Accordingly her chemotherapy has been rescheduled for mid Nov 16, 2019.  Incidentally she tolerated port placement well.   HISTORY OF CURRENT ILLNESS: From the original intake note:  Krista [name] [name]"] herself palpated a lump in the upper-inner right breast sometime late December 2020 or early January 2021.  As the mass grew larger and persisted through menstrual periods she brought it to medical attention and underwent bilateral diagnostic mammography with tomography and bilateral breast ultrasonography at [facility] on 09/26/2019 showing: breast density category C; 5.1 cm mass involving upper-inner quadrant of right breast; no evidence of right axillary lymphadenopathy or left breast malignancy.  Accordingly on 10/03/2019 she proceeded to biopsy of the right breast area in question. The pathology from this procedure ([id number]) showed: invasive ductal carcinoma, grade 3. Prognostic indicators significant for: estrogen receptor, 0% negative and progesterone receptor, 0% negative. Proliferation marker Ki67 at 80%. HER2 negative by immunohistochemistry (1+).  The patient's subsequent history is as detailed below.   PAST MEDICAL HISTORY: Past Medical History:  Diagnosis Date  . Anxiety   . Cancer ([place])   . Family history of breast cancer   . Family history of cervical cancer   . Family history of lung cancer   . Hemorrhoids     PAST SURGICAL HISTORY: No prior surgeries  FAMILY HISTORY: Family History  Problem Relation Age of Onset  . Breast cancer Mother 74  double mastectomy  . Breast cancer Half-Sister 83  . Cirrhosis Half-Sister   . Heart Problems Father     . Stroke Father   . Breast cancer Maternal Aunt 70  . Cancer Maternal Uncle 55       unknown type  . Cervical cancer Maternal Grandmother        dx. in her mid-40s  . Lung cancer Paternal Grandmother   . Cancer Other        unknown type; maternal great-uncles/aunts (x3)  . Diabetes Half-Brother   . Cancer Other        unknown type; matenral great-uncles/aunt (x4)  . Cancer Maternal Great-grandfather        unknown type  . Breast cancer Cousin 37       paternal cousin  The patient's father died at age 68 from cardiac complications of drug use.  His mother, the patient's paternal grandmother had what seems to have been cancer of the lung metastatic to the spine.  There was no other cancer on his side of the family to the patient's knowledge.  The patient's mother died at age 47 in the setting of multiple medical issues but she had undergone double mastectomy at the age of 8.  Her mother, the patient's maternal grandmother had cervical cancer.  The patient's mother had 3 brothers and 3 sisters.  1 of those 3 sisters had breast cancer diagnosed at the age of 72.  The patient herself has 2 brothers and 3 sisters one of her sisters was diagnosed with breast cancer at the age of 82 and died at the age of 33   GYNECOLOGIC HISTORY:  Patient's last menstrual period was 10/08/2019. Menarche: 44 years old Age at first live birth: 44 years old [place] P 3 LMP regular, last approximately 5 days, of which the second day is heavy Contraceptive: Husband status post vasectomy HRT no  Hysterectomy? no BSO?  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  Her husband Krista [name] is a Research scientist (medical) and also Librarian, academic at [employer].  Their children are Krista [name], 20, who is Scientist, product/process development at [employer], 18, who will study psychology at [employer] starting the fall 2021; and Krista [name], 20, currently attending [employer].  The patient is not a  church attender    ADVANCED DIRECTIVES: In the absence of any documents to the contrary the patient's husband is her healthcare power of attorney   HEALTH MAINTENANCE: Social History   Tobacco Use  . Smoking status: Never Smoker  . Smokeless tobacco: Never Used  Substance Use Topics  . Alcohol use: Not Currently    Comment: Social drinker  . Drug use: Never     Colonoscopy: n/a (age)  PAP: UTD  Bone density: n/a (age)   No Known Allergies  Current Outpatient Medications  Medication Sig Dispense Refill  . acetaminophen (TYLENOL) 500 MG tablet Take 1-2 tablets (500-1,000 mg total) by mouth every 6 (six) hours as needed. 60 tablet 0  . Daily Multiple Vitamins tablet Take 1 tablet by mouth daily.    Marland Kitchen dexamethasone (DECADRON) 4 MG tablet Take 2 tablets by mouth daily starting the day after Cytoxan x 3 days. Take with food. 30 tablet 1  . lidocaine-prilocaine (EMLA) cream Apply to affected area once (Patient not taking: Reported on 10/21/2019) 30 g 3  . loratadine (CLARITIN) 10 MG tablet Take 1 tablet (10 mg total) by  mouth daily. 60 tablet 0  . LORazepam (ATIVAN) 0.5 MG tablet Take 1 tablet (0.5 mg total) by mouth at bedtime as needed (Nausea or vomiting). (Patient not taking: Reported on 10/21/2019) 30 tablet 0  . prochlorperazine (COMPAZINE) 10 MG tablet Take 1 tablet (10 mg total) by mouth every 6 (six) hours as needed (Nausea or vomiting). (Patient not taking: Reported on 10/21/2019) 30 tablet 1   No current facility-administered medications for this visit.   Facility-Administered Medications Ordered in Other Visits  Medication Dose Route Frequency Provider Last Rate Last Admin  . 0.9 %  sodium chloride infusion   Intravenous Continuous Allred, Darrell K, PA-C   Stopped at 11/07/19 1415  . fentaNYL (SUBLIMAZE) 100 MCG/2ML injection           . heparin lock flush 100 UNIT/ML injection           . lidocaine (XYLOCAINE) 1 % (with pres) injection           . lidocaine-EPINEPHrine  (XYLOCAINE W/EPI) 1 %-1:100000 (with pres) injection           . midazolam (VERSED) 2 MG/2ML injection             OBJECTIVE: African-American woman examined in a wheelchair  Vitals:   11/07/19 1454  BP: (!) 146/100  Pulse: 85  Resp: 18  Temp: 98.7 F (37.1 C)  SpO2: 100%     Body mass index is 32.77 kg/m.   Wt Readings from Last 3 Encounters:  11/07/19 209 lb 3.2 oz (94.9 kg)  10/17/19 211 lb 4.8 oz (95.8 kg)      ECOG FS:1 - Symptomatic but completely ambulatory   LAB RESULTS:  CMP     Component Value Date/Time   NA 141 10/17/2019 1529   K 4.0 10/17/2019 1529   CL 106 10/17/2019 1529   CO2 24 10/17/2019 1529   GLUCOSE 87 10/17/2019 1529   BUN 10 10/17/2019 1529   CREATININE 0.95 10/17/2019 1529   CALCIUM 8.9 10/17/2019 1529   PROT 7.8 10/17/2019 1529   ALBUMIN 4.0 10/17/2019 1529   AST 16 10/17/2019 1529   ALT 13 10/17/2019 1529   ALKPHOS 66 10/17/2019 1529   BILITOT 1.1 10/17/2019 1529   GFRNONAA >60 10/17/2019 1529   GFRAA >60 10/17/2019 1529    No results found for: TOTALPROTELP, ALBUMINELP, A1GS, A2GS, BETS, BETA2SER, GAMS, MSPIKE, SPEI  Lab Results  Component Value Date   WBC 8.5 11/07/2019   NEUTROABS 6.0 11/07/2019   HGB 12.2 11/07/2019   HCT 35.7 (L) 11/07/2019   MCV 72.0 (L) 11/07/2019   PLT 276 11/07/2019    No results found for: LABCA2  No components found for: URKYHC623  Recent Labs  Lab 11/07/19 1022  INR 1.1    No results found for: LABCA2  No results found for: JSE831  No results found for: DVV616  No results found for: WVP710  No results found for: CA2729  No components found for: HGQUANT  No results found for: CEA1 / No results found for: CEA1   No results found for: AFPTUMOR  No results found for: CHROMOGRNA  No results found for: KPAFRELGTCHN, LAMBDASER, KAPLAMBRATIO (kappa/lambda light chains)  No results found for: HGBA, HGBA2QUANT, HGBFQUANT, HGBSQUAN (Hemoglobinopathy evaluation)   No results found  for: LDH  Lab Results  Component Value Date   IRON 99 10/17/2019   TIBC 391 10/17/2019   IRONPCTSAT 25 10/17/2019   (Iron and TIBC)  Lab Results  Component Value  Date   FERRITIN 45 10/17/2019    Urinalysis No results found for: COLORURINE, APPEARANCEUR, LABSPEC, PHURINE, GLUCOSEU, HGBUR, BILIRUBINUR, KETONESUR, PROTEINUR, UROBILINOGEN, NITRITE, LEUKOCYTESUR   STUDIES: CT Chest W Contrast  Result Date: 10/31/2019 CLINICAL DATA:  Breast cancer staging, diagnosed 3 weeks ago. EXAM: CT CHEST, ABDOMEN, AND PELVIS WITH CONTRAST TECHNIQUE: Multidetector CT imaging of the chest, abdomen and pelvis was performed following the standard protocol during bolus administration of intravenous contrast. CONTRAST:  18m OMNIPAQUE IOHEXOL 300 MG/ML  SOLN COMPARISON:  None. FINDINGS: CT CHEST FINDINGS Cardiovascular: Heart is at the upper limits of normal in size to mildly enlarged. No pericardial effusion. Mediastinum/Nodes: Heterogeneous lymph node in the right axilla measures 1.5 cm (2/19). No additional pathologically enlarged axillary, internal mammary, mediastinal or hilar lymph nodes. Esophagus is unremarkable. Lungs/Pleura: 2 mm subpleural posterior right upper lobe nodule (7/52). 1 mm inferior left lower lobe nodule (7/141). No pleural fluid. Airway is unremarkable. Musculoskeletal: Heterogeneous lobulated mass with surrounding nodularity in the right breast measures at least 3.4 x 4.9 cm (2/13). There is a soft tissue nodule in the lateral left breast measuring 1.7 x 2.0 cm (2/22). No worrisome lytic or sclerotic lesions. CT ABDOMEN PELVIS FINDINGS Hepatobiliary: Ill-defined hypodense lesion in segment 4 of the liver measures 1.5 cm (2/59). Liver and gallbladder are otherwise unremarkable. No biliary ductal dilatation. Pancreas: Negative. Spleen: Negative. Adrenals/Urinary Tract: Adrenal glands and kidneys are unremarkable. Mild dilatation of the ureters bilaterally, left greater than right, possibly due  to an enlarged uterus. 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 Bladder is grossly unremarkable. Stomach/Bowel: Stomach, small bowel, appendix and colon are unremarkable. Fair amount of stool in the colon is indicative of constipation. Vascular/Lymphatic: Vascular structures are unremarkable. No pathologically enlarged lymph nodes. Reproductive: Uterus is enlarged and heterogeneous, with a dominant mass measuring approximately 8.6 cm (6/104). Prominent nabothian cysts. No adnexal mass. Other: No free fluid.  Mesenteries and peritoneum are unremarkable. Musculoskeletal: No worrisome lytic or sclerotic lesions. IMPRESSION: 1. Large right breast mass with metastatic right axillary adenopathy. 2. Ill-defined low-attenuation lesion in segment 4 of the liver is worrisome for metastatic disease. Further evaluation with MR abdomen without and with contrast could be performed, as clinically indicated. 3. Lateral left breast nodule. Patient recently underwent sonographic and MRI evaluation on 09/06/2019 and 10/27/2019, respectively. 4. Enlarged, heterogeneous and fibroid uterus which may cause mild bilateral ureteral dilatation, left greater than right. 5. Tiny subpleural pulmonary nodules are nonspecific and not felt to be metastatic. Continued attention on follow-up exams is suggested. Electronically Signed   By: MLorin PicketM.D.   On: 10/31/2019 11:45   NM Bone Scan Whole Body  Result Date: 10/31/2019 CLINICAL DATA:  RIGHT breast cancer, staging EXAM: NUCLEAR MEDICINE WHOLE BODY BONE SCAN TECHNIQUE: Whole body anterior and posterior images were obtained approximately 3 hours after intravenous injection of radiopharmaceutical. RADIOPHARMACEUTICALS:  21 mCi Technetium-936mDP IV COMPARISON:  None Correlation: CT chest abdomen pelvis 10/31/2019 FINDINGS: Symmetric calvarial uptake, nonspecific. Uptake at the shoulders, sternoclavicular joints, hips, and knees,  typically degenerative. No definite sites of abnormal osseous tracer uptake are seen to suggest osseous metastatic disease. Small amount retained tracer within LEFT renal collecting system, corresponding to mildly dilated duplicated collecting system/proximal ureters seen on CT. Remaining urinary tract and soft tissue distribution of tracer unremarkable. IMPRESSION: No scintigraphic evidence of osseous metastatic disease. Electronically Signed   By: MaLavonia Dana.D.   On: 10/31/2019 16:15  CT Abdomen Pelvis W Contrast  Result Date: 10/31/2019 CLINICAL DATA:  Breast cancer staging, diagnosed 3 weeks ago. EXAM: CT CHEST, ABDOMEN, AND PELVIS WITH CONTRAST TECHNIQUE: Multidetector CT imaging of the chest, abdomen and pelvis was performed following the standard protocol during bolus administration of intravenous contrast. CONTRAST:  155m OMNIPAQUE IOHEXOL 300 MG/ML  SOLN COMPARISON:  None. FINDINGS: CT CHEST FINDINGS Cardiovascular: Heart is at the upper limits of normal in size to mildly enlarged. No pericardial effusion. Mediastinum/Nodes: Heterogeneous lymph node in the right axilla measures 1.5 cm (2/19). No additional pathologically enlarged axillary, internal mammary, mediastinal or hilar lymph nodes. Esophagus is unremarkable. Lungs/Pleura: 2 mm subpleural posterior right upper lobe nodule (7/52). 1 mm inferior left lower lobe nodule (7/141). No pleural fluid. Airway is unremarkable. Musculoskeletal: Heterogeneous lobulated mass with surrounding nodularity in the right breast measures at least 3.4 x 4.9 cm (2/13). There is a soft tissue nodule in the lateral left breast measuring 1.7 x 2.0 cm (2/22). No worrisome lytic or sclerotic lesions. CT ABDOMEN PELVIS FINDINGS Hepatobiliary: Ill-defined hypodense lesion in segment 4 of the liver measures 1.5 cm (2/59). Liver and gallbladder are otherwise unremarkable. No biliary ductal dilatation. Pancreas: Negative. Spleen: Negative. Adrenals/Urinary Tract: Adrenal  glands and kidneys are unremarkable. Mild dilatation of the ureters bilaterally, left greater than right, possibly due to an enlarged uterus. 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 Bladder is grossly unremarkable. Stomach/Bowel: Stomach, small bowel, appendix and colon are unremarkable. Fair amount of stool in the colon is indicative of constipation. Vascular/Lymphatic: Vascular structures are unremarkable. No pathologically enlarged lymph nodes. Reproductive: Uterus is enlarged and heterogeneous, with a dominant mass measuring approximately 8.6 cm (6/104). Prominent nabothian cysts. No adnexal mass. Other: No free fluid.  Mesenteries and peritoneum are unremarkable. Musculoskeletal: No worrisome lytic or sclerotic lesions. IMPRESSION: 1. Large right breast mass with metastatic right axillary adenopathy. 2. Ill-defined low-attenuation lesion in segment 4 of the liver is worrisome for metastatic disease. Further evaluation with MR abdomen without and with contrast could be performed, as clinically indicated. 3. Lateral left breast nodule. Patient recently underwent sonographic and MRI evaluation on 09/06/2019 and 10/27/2019, respectively. 4. Enlarged, heterogeneous and fibroid uterus which may cause mild bilateral ureteral dilatation, left greater than right. 5. Tiny subpleural pulmonary nodules are nonspecific and not felt to be metastatic. Continued attention on follow-up exams is suggested. Electronically Signed   By: MLorin PicketM.D.   On: 10/31/2019 11:45   MR BREAST BILATERAL W WO CONTRAST INC CAD  Result Date: 10/27/2019 CLINICAL DATA:  44year old female with recently diagnosed grade 3 invasive ductal carcinoma of the right breast post ultrasound-guided core biopsy of a 5.1 cm palpable mass at the 1 o'clock position. The patient does have a very strong family history of breast cancer, including in her mother, sister as well as a maternal aunt all  diagnosed before age 44 LABS:  Not applicable. EXAM: BILATERAL BREAST MRI WITH AND WITHOUT CONTRAST TECHNIQUE: Multiplanar, multisequence MR images of both breasts were obtained prior to and following the intravenous administration of 9 ml of Gadavist Three-dimensional MR images were rendered by post-processing of the original MR data on an independent workstation. The three-dimensional MR images were interpreted, and findings are reported in the following complete MRI report for this study. Three dimensional images were evaluated at the independent DynaCad workstation COMPARISON:  Prior mammograms and ultrasounds dated 09/26/2019 and 10/03/2019. FINDINGS: Breast composition: c.  Heterogeneous fibroglandular tissue.  Background parenchymal enhancement: Mild. Right breast: Heterogeneously enhancing partially necrotic mass at site of biopsy proven malignancy in the upper inner right breast measures up to 7.9 cm AP, 4.6 cm transverse, and 4.6 cm craniocaudal. Several small satellite masses are present along the inferior margin of the dominant mass (subtraction images 68-70. In addition, there is linear oriented non mass enhancement (subtraction image 79) in the lower inner right breast measuring 3.5 cm AP. Left breast: No mass or abnormal enhancement. Lymph nodes: There is a morphologically abnormal level I in the right axilla (series 3, image 46) measuring up to 2.2 cm. Ancillary findings:  None. IMPRESSION: 1. Biopsy proven malignancy in the upper inner right breast with small adjacent satellite masses along the inferior margin measures up to 7.9 x 4.6 x 4.6 cm. 2. Suspicious linear oriented enhancement in the lower inner quadrant of the right breast slightly inferior to the dominant mass measures up to 3.5 cm. 3.  Morphologically abnormal level I lymph node in the right axilla. 4.  No MRI evidence of malignancy in the left breast. RECOMMENDATION: 1. Recommend the patient return for second-look ultrasound and biopsy  of the morphologically abnormal lymph node in the right axilla. 2. If breast conservation is a consideration, then recommend the patient return for MRI guided biopsy of the linear non mass enhancement in the lower inner quadrant of the right breast (subtraction image 79). BI-RADS CATEGORY  4: Suspicious. Electronically Signed   By: Everlean Alstrom M.D.   On: 10/27/2019 15:51   ECHOCARDIOGRAM COMPLETE  Result Date: 10/24/2019    ECHOCARDIOGRAM REPORT   Patient Name:   Sylver ESTER M [name] Date of Exam: 10/24/2019 Medical Rec #:  [id number]                  Height:       67.0 in Accession #:    [id number]                 Weight:       211.0 lb Date of Birth:  15-Mar-1976                  BSA:          2.069 m Patient Age:    39 years                   BP:           154/108 mmHg Patient Gender: F                          HR:           79 bpm. Exam Location:  Outpatient Procedure: 2D Echo Indications:    Chemo V67.2 / Z09  History:        Patient has no prior history of Echocardiogram examinations.                 Breast cancer.  Sonographer:    Vikki Ports Turrentine Referring Phys: [place]  1. Left ventricular ejection fraction, by estimation, is 60 to 65%. The left ventricle has normal function. The left ventricle has no regional wall motion abnormalities. Left ventricular diastolic parameters are consistent with Grade I diastolic dysfunction (impaired relaxation). The average left ventricular global longitudinal strain is -21.6 %. The global longitudinal strain is normal.  2. Lateral annulus velocity: 16.4 cm/s. Right ventricular systolic function is normal. The right ventricular size  is normal.  3. The mitral valve is normal in structure. Trivial mitral valve regurgitation. No evidence of mitral stenosis.  4. The aortic valve is normal in structure. Aortic valve regurgitation is not visualized. No aortic stenosis is present.  5. The inferior vena cava is normal in size with greater  than 50% respiratory variability, suggesting right atrial pressure of 3 mmHg. FINDINGS  Left Ventricle: Left ventricular ejection fraction, by estimation, is 60 to 65%. The left ventricle has normal function. The left ventricle has no regional wall motion abnormalities. The average left ventricular global longitudinal strain is -21.6 %. The global longitudinal strain is normal. The left ventricular internal cavity size was normal in size. There is no left ventricular hypertrophy. Left ventricular diastolic parameters are consistent with Grade I diastolic dysfunction (impaired relaxation). Right Ventricle: Lateral annulus velocity: 16.4 cm/s. The right ventricular size is normal. No increase in right ventricular wall thickness. Right ventricular systolic function is normal. Left Atrium: Left atrial size was normal in size. Right Atrium: Right atrial size was normal in size. Pericardium: There is no evidence of pericardial effusion. Mitral Valve: The mitral valve is normal in structure. Normal mobility of the mitral valve leaflets. Trivial mitral valve regurgitation. No evidence of mitral valve stenosis. Tricuspid Valve: The tricuspid valve is normal in structure. Tricuspid valve regurgitation is trivial. No evidence of tricuspid stenosis. Aortic Valve: The aortic valve is normal in structure. Aortic valve regurgitation is not visualized. No aortic stenosis is present. Aortic valve mean gradient measures 5.0 mmHg. Aortic valve peak gradient measures 9.5 mmHg. Aortic valve area, by VTI measures 2.78 cm. Pulmonic Valve: The pulmonic valve was normal in structure. Pulmonic valve regurgitation is not visualized. No evidence of pulmonic stenosis. Aorta: The aortic root is normal in size and structure. Venous: The inferior vena cava is normal in size with greater than 50% respiratory variability, suggesting right atrial pressure of 3 mmHg. IAS/Shunts: No atrial level shunt detected by color flow Doppler.  LEFT VENTRICLE  PLAX 2D LVIDd:         3.90 cm  Diastology LVIDs:         2.40 cm  LV e' lateral:   6.96 cm/s LV PW:         1.10 cm  LV E/e' lateral: 14.8 LV IVS:        1.10 cm  LV e' medial:    5.87 cm/s LVOT diam:     2.00 cm  LV E/e' medial:  17.5 LV SV:         78 LV SV Index:   38       2D Longitudinal Strain LVOT Area:     3.14 cm 2D Strain GLS Avg:     -21.6 %  RIGHT VENTRICLE RV S prime:     16.40 cm/s TAPSE (M-mode): 2.1 cm LEFT ATRIUM             Index       RIGHT ATRIUM           Index LA diam:        4.20 cm 2.03 cm/m  RA Area:     17.40 cm LA Vol (A2C):   65.5 ml 31.66 ml/m RA Volume:   42.70 ml  20.64 ml/m LA Vol (A4C):   61.9 ml 29.92 ml/m LA Biplane Vol: 64.6 ml 31.23 ml/m  AORTIC VALVE AV Area (Vmax):    2.35 cm AV Area (Vmean):   2.52 cm AV Area (VTI):  2.78 cm AV Vmax:           154.00 cm/s AV Vmean:          110.000 cm/s AV VTI:            0.279 m AV Peak Grad:      9.5 mmHg AV Mean Grad:      5.0 mmHg LVOT Vmax:         115.00 cm/s LVOT Vmean:        88.300 cm/s LVOT VTI:          0.247 m LVOT/AV VTI ratio: 0.89  AORTA Ao Root diam: 3.20 cm MITRAL VALVE MV Area (PHT): 3.50 cm     SHUNTS MV Decel Time: 217 msec     Systemic VTI:  0.25 m MV E velocity: 103.00 cm/s  Systemic Diam: 2.00 cm MV A velocity: 109.00 cm/s MV E/A ratio:  0.94 Candee Furbish MD Electronically signed by Candee Furbish MD Signature Date/Time: 10/24/2019/12:16:00 PM    Final    IR IMAGING GUIDED PORT INSERTION  Result Date: 11/07/2019 INDICATION: 44 year old with right breast cancer. Port-A-Cath needed for chemotherapy. EXAM: FLUOROSCOPIC AND ULTRASOUND GUIDED PLACEMENT OF A SUBCUTANEOUS PORT. Physician: Stephan Minister. Henn, MD MEDICATIONS: Ancef 2 g, antibiotics were given within 1 hour of the procedure. ANESTHESIA/SEDATION: Versed 4.0 mg IV; Fentanyl 100 mcg IV; Moderate Sedation Time:  49 minutes The patient was continuously monitored during the procedure by the interventional radiology nurse under my direct supervision. FLUOROSCOPY  TIME:  2 minutes, 12 seconds; 17 mGy COMPLICATIONS: None immediate. PROCEDURE: The procedure was explained to the patient. The risks and benefits of the procedure were discussed and the patient's questions were addressed. Informed consent was obtained from the patient. Patient was placed supine on the interventional table. Ultrasound confirmed a patent left internal jugular vein. The left chest and neck were cleaned with a skin antiseptic and a sterile drape was placed. Maximal barrier sterile technique was utilized including caps, mask, sterile gowns, sterile gloves, sterile drape, hand hygiene and skin antiseptic. The left neck was anesthetized with 1% lidocaine. Small incision was made in the left neck with a blade. Micropuncture set was placed in the left IJ with ultrasound guidance. The micropuncture wire was used for measurement purposes. The left chest was anesthetized with 1% lidocaine with epinephrine. #15 blade was used to make an incision and a subcutaneous port pocket was formed. [place] was assembled. Subcutaneous tunnel was formed with a stiff tunneling device. The port catheter was brought through the subcutaneous tunnel. The port was placed in the subcutaneous pocket. The micropuncture set was exchanged for a peel-away sheath. The catheter was placed through the peel-away sheath and the tip was noted to be in the upper SVC. This placement was not felt to be adequate for long-term catheter placement. Unable to remove the catheter through the vein dermatotomy site. Therefore, the entire catheter was removed. Using ultrasound guidance, a 21 gauge needle was directed into the left internal jugular vein and wire was advanced centrally. Another micropuncture dilator set was placed. A new 8 French port catheter was tunneled through the subcutaneous tract to the vein dermatotomy site. The micropuncture catheter was exchanged for a peel-away sheath over a J wire. Catheter was advanced through the  peel-away sheath and directed into the central venous system. Catheter was pulled back into the upper right atrium region. Catheter was cut to an appropriate length and connected to the port device. Port device was  placed in the subcutaneous port pocket. Port was accessed. Port aspirated and flushed well. The port pocket was closed using two layers of absorbable sutures and Dermabond. The vein skin site was closed using a single layer of absorbable suture and Dermabond. Access needle was kept in place after the procedure. Sterile dressings were applied. Patient tolerated the procedure well without an immediate complication. Ultrasound and fluoroscopic images were taken and saved for this procedure. FINDINGS: Patent left internal jugular vein. Initially, the Port-A-Cath tip was in the upper SVC/innominate junction and not adequate for long-term use. Therefore, a new catheter was placed and the tip was placed in the upper right atrium near the SVC and right atrium junction. IMPRESSION: Placement of a subcutaneous port device. Electronically Signed   By: Markus Daft M.D.   On: 11/07/2019 14:47     ELIGIBLE FOR AVAILABLE RESEARCH PROTOCOL: [id number]  ASSESSMENT: 44 y.o. [place] woman status post right breast upper inner quadrant biopsy 10/03/2019 for a clinical mT3 N1, stage IIIC invasive ductal carcinoma, grade 3, triple negative, with an MIB-1 of 80%  (a) biopsy of a right axillary node  (b) CT chest/abd/pelvis 10/31/2019 shows 1.5 cm hypodense lesion in liver, lung nodules <0.3 cm  (c) bone scan 10/31/2019 negative  (d) liver MRI  (1) genetics testing   (2) neoadjuvant chemotherapy to consist of cyclophosphamide and doxorubicin in dose dense fashion x4 followed by paclitaxel and carboplatin weekly x12  (a) echo 10/24/2019 shows an ejection fraction in the 60-65% range  (3) definitive surgery to follow  (4) adjuvant radiation   PLAN:  Krista [name] has a good echocardiogram and tolerated port  placement well.  She also met with our chemotherapy teaching nurse and was very complementary on how much she learned on that occasion.  She also had her genetics test results back but she has not yet received them and I was not able to obtain them for her today  Staging studies show a lesion in the liver which may or may not be relevant, and a right axillary lymph node which needs to be biopsied.  The liver will be MRI on 11/11/2019.  The axillary node biopsy is pending.  I have sent a note to the navigators to see if we can possibly get this done before next Wednesday.  She was supposed to meet with me on 11/14/2019 to discuss her tolerance of the initial cycle of chemo but since that has been delayed we have canceled that appointment and instead she will see me on 11/16/2019 just before her chemotherapy to review results of any studies ending (genetics, MRI, axillary lymph node biopsy) and clear any questions they may have  I wrote her some steps on how to take pain medicine as she is likely to be sore for the next 2 days.  I will then see her again a week later to make sure she tolerated chemotherapy well  Total encounter time 25 minutes.Sarajane Jews C. [name] Cuff, MD 11/07/2019 3:33 PM Medical Oncology and Hematology [facility] [place], [place] [address] Tel. [phone]    Fax. [phone]   This document serves as a record of services personally performed by Lurline Del, MD. It was created on his behalf by Wilburn Mylar, a trained medical scribe. The creation of this record is based on the scribe's personal observations and the provider's statements to them.   Lindie Spruce MD, have reviewed the above documentation for accuracy  and completeness, and I agree with the above.   *Total Encounter Time as defined by the Centers for Medicare and Medicaid Services includes, in addition to the face-to-face time of a patient visit (documented in  the note above) non-face-to-face time: obtaining and reviewing outside history, ordering and reviewing medications, tests or procedures, care coordination (communications with other health care professionals or caregivers) and documentation in the medical record.

## 2019-11-07 ENCOUNTER — Ambulatory Visit (HOSPITAL_COMMUNITY)
Admission: RE | Admit: 2019-11-07 | Discharge: 2019-11-07 | Disposition: A | Discharge status: Home / Self Care | Payer: BC Managed Care – PPO | Source: Ambulatory Visit | Attending: Oncology | Admitting: Oncology

## 2019-11-07 ENCOUNTER — Inpatient Hospital Stay: Payer: BC Managed Care – PPO

## 2019-11-07 ENCOUNTER — Encounter (HOSPITAL_COMMUNITY): Payer: Self-pay

## 2019-11-07 ENCOUNTER — Encounter: Payer: Self-pay | Admitting: *Deleted

## 2019-11-07 ENCOUNTER — Inpatient Hospital Stay: Payer: BC Managed Care – PPO | Attending: Oncology | Admitting: Oncology

## 2019-11-07 ENCOUNTER — Other Ambulatory Visit: Payer: Self-pay | Admitting: Oncology

## 2019-11-07 ENCOUNTER — Other Ambulatory Visit: Payer: Self-pay

## 2019-11-07 VITALS — BP 146/100 | HR 85 | Temp 98.7°F | Resp 18 | Ht 67.0 in | Wt 209.2 lb

## 2019-11-07 DIAGNOSIS — C50411 Malignant neoplasm of upper-outer quadrant of right female breast: Secondary | ICD-10-CM | POA: Insufficient documentation

## 2019-11-07 DIAGNOSIS — Z8049 Family history of malignant neoplasm of other genital organs: Secondary | ICD-10-CM | POA: Insufficient documentation

## 2019-11-07 DIAGNOSIS — Z809 Family history of malignant neoplasm, unspecified: Secondary | ICD-10-CM | POA: Insufficient documentation

## 2019-11-07 DIAGNOSIS — Z171 Estrogen receptor negative status [ER-]: Secondary | ICD-10-CM | POA: Insufficient documentation

## 2019-11-07 DIAGNOSIS — Z5111 Encounter for antineoplastic chemotherapy: Secondary | ICD-10-CM | POA: Insufficient documentation

## 2019-11-07 DIAGNOSIS — C50211 Malignant neoplasm of upper-inner quadrant of right female breast: Secondary | ICD-10-CM | POA: Insufficient documentation

## 2019-11-07 DIAGNOSIS — Z801 Family history of malignant neoplasm of trachea, bronchus and lung: Secondary | ICD-10-CM | POA: Insufficient documentation

## 2019-11-07 DIAGNOSIS — Z79899 Other long term (current) drug therapy: Secondary | ICD-10-CM | POA: Insufficient documentation

## 2019-11-07 DIAGNOSIS — Z803 Family history of malignant neoplasm of breast: Secondary | ICD-10-CM | POA: Insufficient documentation

## 2019-11-07 DIAGNOSIS — Z5189 Encounter for other specified aftercare: Secondary | ICD-10-CM | POA: Diagnosis not present

## 2019-11-07 DIAGNOSIS — D569 Thalassemia, unspecified: Secondary | ICD-10-CM | POA: Diagnosis not present

## 2019-11-07 DIAGNOSIS — K769 Liver disease, unspecified: Secondary | ICD-10-CM | POA: Insufficient documentation

## 2019-11-07 DIAGNOSIS — F419 Anxiety disorder, unspecified: Secondary | ICD-10-CM | POA: Diagnosis not present

## 2019-11-07 DIAGNOSIS — R918 Other nonspecific abnormal finding of lung field: Secondary | ICD-10-CM | POA: Diagnosis not present

## 2019-11-07 HISTORY — PX: IR IMAGING GUIDED PORT INSERTION: IMG5740

## 2019-11-07 HISTORY — DX: Malignant (primary) neoplasm, unspecified: C80.1

## 2019-11-07 LAB — CBC WITH DIFFERENTIAL/PLATELET
Abs Immature Granulocytes: 0.01 10*3/uL (ref 0.00–0.07)
Basophils Absolute: 0.1 10*3/uL (ref 0.0–0.1)
Basophils Relative: 1 %
Eosinophils Absolute: 0.2 10*3/uL (ref 0.0–0.5)
Eosinophils Relative: 3 %
HCT: 35.7 % — ABNORMAL LOW (ref 36.0–46.0)
Hemoglobin: 12.2 g/dL (ref 12.0–15.0)
Immature Granulocytes: 0 %
Lymphocytes Relative: 20 %
Lymphs Abs: 1.7 10*3/uL (ref 0.7–4.0)
MCH: 24.6 pg — ABNORMAL LOW (ref 26.0–34.0)
MCHC: 34.2 g/dL (ref 30.0–36.0)
MCV: 72 fL — ABNORMAL LOW (ref 80.0–100.0)
Monocytes Absolute: 0.5 10*3/uL (ref 0.1–1.0)
Monocytes Relative: 5 %
Neutro Abs: 6 10*3/uL (ref 1.7–7.7)
Neutrophils Relative %: 71 %
Platelets: 276 10*3/uL (ref 150–400)
RBC: 4.96 MIL/uL (ref 3.87–5.11)
RDW: 15.8 % — ABNORMAL HIGH (ref 11.5–15.5)
WBC: 8.5 10*3/uL (ref 4.0–10.5)
nRBC: 0 % (ref 0.0–0.2)

## 2019-11-07 LAB — PROTIME-INR
INR: 1.1 (ref 0.8–1.2)
Prothrombin Time: 13.3 seconds (ref 11.4–15.2)

## 2019-11-07 MED ORDER — LIDOCAINE HCL 1 % IJ SOLN
INTRAMUSCULAR | Status: AC
  Filled 2019-11-07: qty 20

## 2019-11-07 MED ORDER — MIDAZOLAM HCL 2 MG/2ML IJ SOLN
INTRAMUSCULAR | Status: AC
  Filled 2019-11-07: qty 4

## 2019-11-07 MED ORDER — SODIUM CHLORIDE 0.9 % IV SOLN: INTRAVENOUS | Status: DC

## 2019-11-07 MED ORDER — CEFAZOLIN SODIUM-DEXTROSE 2-4 GM/100ML-% IV SOLN
INTRAVENOUS | Status: AC
  Administered 2019-11-07: 2 g via INTRAVENOUS
  Filled 2019-11-07: qty 100

## 2019-11-07 MED ORDER — LORATADINE 10 MG PO TABS: 10.0000 mg | ORAL_TABLET | Freq: Every day | ORAL | 0 refills | Status: DC

## 2019-11-07 MED ORDER — MIDAZOLAM HCL 2 MG/2ML IJ SOLN
INTRAMUSCULAR | Status: AC | PRN
  Administered 2019-11-07 (×4): 1 mg via INTRAVENOUS

## 2019-11-07 MED ORDER — LIDOCAINE HCL (PF) 1 % IJ SOLN
INTRAMUSCULAR | Status: AC | PRN
  Administered 2019-11-07: 5 mL
  Administered 2019-11-07: 10 mL

## 2019-11-07 MED ORDER — FENTANYL CITRATE (PF) 100 MCG/2ML IJ SOLN
INTRAMUSCULAR | Status: AC | PRN
  Administered 2019-11-07 (×2): 50 ug via INTRAVENOUS

## 2019-11-07 MED ORDER — LIDOCAINE-EPINEPHRINE 1 %-1:100000 IJ SOLN
INTRAMUSCULAR | Status: AC
  Filled 2019-11-07: qty 1

## 2019-11-07 MED ORDER — HEPARIN SOD (PORK) LOCK FLUSH 100 UNIT/ML IV SOLN
INTRAVENOUS | Status: AC
  Filled 2019-11-07: qty 5

## 2019-11-07 MED ORDER — DEXAMETHASONE 4 MG PO TABS: ORAL_TABLET | ORAL | 1 refills | Status: DC

## 2019-11-07 MED ORDER — FENTANYL CITRATE (PF) 100 MCG/2ML IJ SOLN
INTRAMUSCULAR | Status: AC
  Filled 2019-11-07: qty 2

## 2019-11-07 MED ORDER — HEPARIN SOD (PORK) LOCK FLUSH 100 UNIT/ML IV SOLN
INTRAVENOUS | Status: AC | PRN
  Administered 2019-11-07: 500 [IU] via INTRAVENOUS

## 2019-11-07 MED ORDER — ACETAMINOPHEN 500 MG PO TABS: 500.0000 mg | ORAL_TABLET | Freq: Four times a day (QID) | ORAL | 0 refills | Status: AC | PRN

## 2019-11-07 MED ORDER — CEFAZOLIN SODIUM-DEXTROSE 2-4 GM/100ML-% IV SOLN: 2.0000 g | INTRAVENOUS | Status: AC

## 2019-11-07 NOTE — Consult Note (Signed)
Chief Complaint: Patient was seen in consultation today for Port-A-Cath placement  Referring Physician(s): Magrinat,Gustav C  Supervising Physician: Markus Daft  Patient Status: [facility] - Out-pt  History of Present Illness: Krista [name] is a 44 y.o. female with history of newly diagnosed right breast carcinoma who presents today for Port-A-Cath placement for chemotherapy.  Imaging studies have revealed: 1. Large right breast mass with metastatic right axillary adenopathy. 2. Ill-defined low-attenuation lesion in segment 4 of the liver is worrisome for metastatic disease. Further evaluation with MR abdomen without and with contrast could be performed, as clinically indicated. 3. Lateral left breast nodule. Patient recently underwent sonographic and MRI evaluation on 09/06/2019 and 10/27/2019, respectively. 4. Enlarged, heterogeneous and fibroid uterus which may cause mild bilateral ureteral dilatation, left greater than right. 5. Tiny subpleural pulmonary nodules are nonspecific and not felt to be metastatic. Continued attention on follow-up exams is suggested.   Past Medical History:  Diagnosis Date  . Anxiety   . Cancer ([place])   . Family history of breast cancer   . Family history of cervical cancer   . Family history of lung cancer   . Hemorrhoids     History reviewed. No pertinent surgical history.  Allergies: Patient has no known allergies.  Medications: Prior to Admission medications   Medication Sig Start Date End Date Taking? Authorizing Provider  Daily Multiple Vitamins tablet Take 1 tablet by mouth daily.   Yes [provider]  dexamethasone (DECADRON) 4 MG tablet Take 2 tablets by mouth daily starting the day after Carboplatin and Cytoxan x 3 days. Take with food. Patient not taking: Reported on 10/21/2019 10/17/19   Magrinat, [name], MD  lidocaine-prilocaine (EMLA) cream Apply to affected area once Patient not taking: Reported on  10/21/2019 10/17/19   Magrinat, [name], MD  loratadine (CLARITIN) 10 MG tablet Take 1 tablet (10 mg total) by mouth daily. 11/07/19   Magrinat, [name], MD  LORazepam (ATIVAN) 0.5 MG tablet Take 1 tablet (0.5 mg total) by mouth at bedtime as needed (Nausea or vomiting). Patient not taking: Reported on 10/21/2019 10/17/19   Magrinat, [name], MD  prochlorperazine (COMPAZINE) 10 MG tablet Take 1 tablet (10 mg total) by mouth every 6 (six) hours as needed (Nausea or vomiting). Patient not taking: Reported on 10/21/2019 10/17/19   Magrinat, [name], MD     Family History  Problem Relation Age of Onset  . Breast cancer Mother 59       double mastectomy  . Breast cancer Half-Sister 74  . Cirrhosis Half-Sister   . Heart Problems Father   . Stroke Father   . Breast cancer Maternal Aunt 65  . Cancer Maternal Uncle 79       unknown type  . Cervical cancer Maternal Grandmother        dx. in her mid-40s  . Lung cancer Paternal Grandmother   . Cancer Other        unknown type; maternal great-uncles/aunts (x3)  . Diabetes Half-Brother   . Cancer Other        unknown type; matenral great-uncles/aunt (x4)  . Cancer Maternal Great-grandfather        unknown type  . Breast cancer Cousin 63       paternal cousin    Social History   Socioeconomic History  . Marital status: Married    Spouse name: Not on file  . Number of children: Not on file  . Years of education: Not  on file  . Highest education level: Not on file  Occupational History  . Not on file  Tobacco Use  . Smoking status: Never Smoker  . Smokeless tobacco: Never Used  Substance and Sexual Activity  . Alcohol use: Not Currently    Comment: Social drinker  . Drug use: Never  . Sexual activity: Not Currently  Other Topics Concern  . Not on file  Social History Narrative  . Not on file   Social Determinants of Health   Financial Resource Strain:   . Difficulty of Paying Living Expenses:   Food Insecurity:   . Worried  About Charity fundraiser in the Last Year:   . Arboriculturist in the Last Year:   Transportation Needs:   . Film/video editor (Medical):   Marland Kitchen Lack of Transportation (Non-Medical):   Physical Activity:   . Days of Exercise per Week:   . Minutes of Exercise per Session:   Stress:   . Feeling of Stress :   Social Connections:   . Frequency of Communication with Friends and Family:   . Frequency of Social Gatherings with Friends and Family:   . Attends Religious Services:   . Active Member of Clubs or Organizations:   . Attends Archivist Meetings:   Marland Kitchen Marital Status:       Review of Systems she currently denies fever, headache, chest pain, cough, abdominal/back pain, nausea, vomiting or bleeding.  She does have some mild dyspnea with exertion and is anxious.  Vital Signs: BP (!) 148/107 (BP Location: Right Arm)   Pulse 78   Temp 98.2 F (36.8 C) (Oral)   Resp 18   LMP 10/08/2019   SpO2 98%   Physical Exam awake, alert.  Chest clear to auscultation bilaterally.  Heart with regular rate and rhythm.  Abdomen soft, positive bowel sounds, nontender.  Extremities with full range of motion.  Imaging: CT Chest W Contrast  Result Date: 10/31/2019 CLINICAL DATA:  Breast cancer staging, diagnosed 3 weeks ago. EXAM: CT CHEST, ABDOMEN, AND PELVIS WITH CONTRAST TECHNIQUE: Multidetector CT imaging of the chest, abdomen and pelvis was performed following the standard protocol during bolus administration of intravenous contrast. CONTRAST:  150mL OMNIPAQUE IOHEXOL 300 MG/ML  SOLN COMPARISON:  None. FINDINGS: CT CHEST FINDINGS Cardiovascular: Heart is at the upper limits of normal in size to mildly enlarged. No pericardial effusion. Mediastinum/Nodes: Heterogeneous lymph node in the right axilla measures 1.5 cm (2/19). No additional pathologically enlarged axillary, internal mammary, mediastinal or hilar lymph nodes. Esophagus is unremarkable. Lungs/Pleura: 2 mm subpleural posterior  right upper lobe nodule (7/52). 1 mm inferior left lower lobe nodule (7/141). No pleural fluid. Airway is unremarkable. Musculoskeletal: Heterogeneous lobulated mass with surrounding nodularity in the right breast measures at least 3.4 x 4.9 cm (2/13). There is a soft tissue nodule in the lateral left breast measuring 1.7 x 2.0 cm (2/22). No worrisome lytic or sclerotic lesions. CT ABDOMEN PELVIS FINDINGS Hepatobiliary: Ill-defined hypodense lesion in segment 4 of the liver measures 1.5 cm (2/59). Liver and gallbladder are otherwise unremarkable. No biliary ductal dilatation. Pancreas: Negative. Spleen: Negative. Adrenals/Urinary Tract: Adrenal glands and kidneys are unremarkable. Mild dilatation of the ureters bilaterally, left greater than right, possibly due to an enlarged uterus. 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 Bladder is grossly unremarkable. Stomach/Bowel: Stomach, small bowel, appendix and colon are unremarkable. Fair amount of stool in the colon is indicative  of constipation. Vascular/Lymphatic: Vascular structures are unremarkable. No pathologically enlarged lymph nodes. Reproductive: Uterus is enlarged and heterogeneous, with a dominant mass measuring approximately 8.6 cm (6/104). Prominent nabothian cysts. No adnexal mass. Other: No free fluid.  Mesenteries and peritoneum are unremarkable. Musculoskeletal: No worrisome lytic or sclerotic lesions. IMPRESSION: 1. Large right breast mass with metastatic right axillary adenopathy. 2. Ill-defined low-attenuation lesion in segment 4 of the liver is worrisome for metastatic disease. Further evaluation with MR abdomen without and with contrast could be performed, as clinically indicated. 3. Lateral left breast nodule. Patient recently underwent sonographic and MRI evaluation on 09/06/2019 and 10/27/2019, respectively. 4. Enlarged, heterogeneous and fibroid uterus which may cause mild bilateral ureteral  dilatation, left greater than right. 5. Tiny subpleural pulmonary nodules are nonspecific and not felt to be metastatic. Continued attention on follow-up exams is suggested. Electronically Signed   By: Lorin Picket M.D.   On: 10/31/2019 11:45   NM Bone Scan Whole Body  Result Date: 10/31/2019 CLINICAL DATA:  RIGHT breast cancer, staging EXAM: NUCLEAR MEDICINE WHOLE BODY BONE SCAN TECHNIQUE: Whole body anterior and posterior images were obtained approximately 3 hours after intravenous injection of radiopharmaceutical. RADIOPHARMACEUTICALS:  21 mCi Technetium-56m MDP IV COMPARISON:  None Correlation: CT chest abdomen pelvis 10/31/2019 FINDINGS: Symmetric calvarial uptake, nonspecific. Uptake at the shoulders, sternoclavicular joints, hips, and knees, typically degenerative. No definite sites of abnormal osseous tracer uptake are seen to suggest osseous metastatic disease. Small amount retained tracer within LEFT renal collecting system, corresponding to mildly dilated duplicated collecting system/proximal ureters seen on CT. Remaining urinary tract and soft tissue distribution of tracer unremarkable. IMPRESSION: No scintigraphic evidence of osseous metastatic disease. Electronically Signed   By: Lavonia Dana M.D.   On: 10/31/2019 16:15   CT Abdomen Pelvis W Contrast  Result Date: 10/31/2019 CLINICAL DATA:  Breast cancer staging, diagnosed 3 weeks ago. EXAM: CT CHEST, ABDOMEN, AND PELVIS WITH CONTRAST TECHNIQUE: Multidetector CT imaging of the chest, abdomen and pelvis was performed following the standard protocol during bolus administration of intravenous contrast. CONTRAST:  157mL OMNIPAQUE IOHEXOL 300 MG/ML  SOLN COMPARISON:  None. FINDINGS: CT CHEST FINDINGS Cardiovascular: Heart is at the upper limits of normal in size to mildly enlarged. No pericardial effusion. Mediastinum/Nodes: Heterogeneous lymph node in the right axilla measures 1.5 cm (2/19). No additional pathologically enlarged axillary, internal  mammary, mediastinal or hilar lymph nodes. Esophagus is unremarkable. Lungs/Pleura: 2 mm subpleural posterior right upper lobe nodule (7/52). 1 mm inferior left lower lobe nodule (7/141). No pleural fluid. Airway is unremarkable. Musculoskeletal: Heterogeneous lobulated mass with surrounding nodularity in the right breast measures at least 3.4 x 4.9 cm (2/13). There is a soft tissue nodule in the lateral left breast measuring 1.7 x 2.0 cm (2/22). No worrisome lytic or sclerotic lesions. CT ABDOMEN PELVIS FINDINGS Hepatobiliary: Ill-defined hypodense lesion in segment 4 of the liver measures 1.5 cm (2/59). Liver and gallbladder are otherwise unremarkable. No biliary ductal dilatation. Pancreas: Negative. Spleen: Negative. Adrenals/Urinary Tract: Adrenal glands and kidneys are unremarkable. Mild dilatation of the ureters bilaterally, left greater than right, possibly due to an enlarged uterus. 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 Bladder is grossly unremarkable. Stomach/Bowel: Stomach, small bowel, appendix and colon are unremarkable. Fair amount of stool in the colon is indicative of constipation. Vascular/Lymphatic: Vascular structures are unremarkable. No pathologically enlarged lymph nodes. Reproductive: Uterus is enlarged and heterogeneous, with a dominant mass measuring approximately 8.6 cm (6/104). Prominent  nabothian cysts. No adnexal mass. Other: No free fluid.  Mesenteries and peritoneum are unremarkable. Musculoskeletal: No worrisome lytic or sclerotic lesions. IMPRESSION: 1. Large right breast mass with metastatic right axillary adenopathy. 2. Ill-defined low-attenuation lesion in segment 4 of the liver is worrisome for metastatic disease. Further evaluation with MR abdomen without and with contrast could be performed, as clinically indicated. 3. Lateral left breast nodule. Patient recently underwent sonographic and MRI evaluation on 09/06/2019 and  10/27/2019, respectively. 4. Enlarged, heterogeneous and fibroid uterus which may cause mild bilateral ureteral dilatation, left greater than right. 5. Tiny subpleural pulmonary nodules are nonspecific and not felt to be metastatic. Continued attention on follow-up exams is suggested. Electronically Signed   By: Lorin Picket M.D.   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 The patient does have a very strong family history of breast cancer, including in her mother, sister as well as a maternal aunt all diagnosed before age 75. LABS:  Not applicable. EXAM: BILATERAL BREAST MRI WITH AND WITHOUT CONTRAST TECHNIQUE: Multiplanar, multisequence MR images of both breasts were obtained prior to and following the intravenous administration of 9 ml of Gadavist Three-dimensional MR images were rendered by post-processing of the original MR data on an independent workstation. The three-dimensional MR images were interpreted, and findings are reported in the following complete MRI report for this study. Three dimensional images were evaluated at the independent DynaCad workstation COMPARISON:  Prior mammograms and ultrasounds dated 09/26/2019 and 10/03/2019. FINDINGS: Breast composition: c.  Heterogeneous fibroglandular tissue. Background parenchymal enhancement: Mild. Right breast: Heterogeneously enhancing partially necrotic mass at site of biopsy proven malignancy in the upper inner right breast measures up to 7.9 cm AP, 4.6 cm transverse, and 4.6 cm craniocaudal. Several small satellite masses are present along the inferior margin of the dominant mass (subtraction images 68-70. In addition, there is linear oriented non mass enhancement (subtraction image 79) in the lower  inner right breast measuring 3.5 cm AP. Left breast: No mass or abnormal enhancement. Lymph nodes: There is a morphologically abnormal level I in the right axilla (series 3, image 46) measuring up to 2.2 cm. Ancillary findings:  None. IMPRESSION: 1. Biopsy proven malignancy in the upper inner right breast with small adjacent satellite masses along the inferior margin measures up to 7.9 x 4.6 x 4.6 cm. 2. Suspicious linear oriented enhancement in the lower inner quadrant of the right breast slightly inferior to the dominant mass measures up to 3.5 cm. 3.  Morphologically abnormal level I lymph node in the right axilla. 4.  No MRI evidence of malignancy in the left breast. RECOMMENDATION: 1. Recommend the patient return for second-look ultrasound and biopsy of the morphologically abnormal lymph node in the right axilla. 2. If breast conservation is a consideration, then recommend the patient return for MRI guided biopsy of the linear non mass enhancement in the lower inner quadrant of the right breast (subtraction image 79). BI-RADS CATEGORY  4: Suspicious. Electronically Signed   By: Everlean Alstrom M.D.   On: 10/27/2019 15:51   ECHOCARDIOGRAM COMPLETE  Result Date: 10/24/2019    ECHOCARDIOGRAM REPORT   Patient Name:   Krista [name] Date of Exam: 10/24/2019 Medical Rec #:  [id number]  Height:       67.0 in Accession #:    [id number]                 Weight:       211.0 lb Date of Birth:  17-Nov-1975                  BSA:          2.069 m Patient Age:    29 years                   BP:           154/108 mmHg Patient Gender: F                          HR:           79 bpm. Exam Location:  Outpatient Procedure: 2D Echo Indications:    Chemo V67.2 / Z09  History:        Patient has no prior history of Echocardiogram examinations.                 Breast cancer.  Sonographer:    Vikki Ports Turrentine Referring Phys: [place]  1. Left ventricular ejection fraction, by  estimation, is 60 to 65%. The left ventricle has normal function. The left ventricle has no regional wall motion abnormalities. Left ventricular diastolic parameters are consistent with Grade I diastolic dysfunction (impaired relaxation). The average left ventricular global longitudinal strain is -21.6 %. The global longitudinal strain is normal.  2. Lateral annulus velocity: 16.4 cm/s. Right ventricular systolic function is normal. The right ventricular size is normal.  3. The mitral valve is normal in structure. Trivial mitral valve regurgitation. No evidence of mitral stenosis.  4. The aortic valve is normal in structure. Aortic valve regurgitation is not visualized. No aortic stenosis is present.  5. The inferior vena cava is normal in size with greater than 50% respiratory variability, suggesting right atrial pressure of 3 mmHg. FINDINGS  Left Ventricle: Left ventricular ejection fraction, by estimation, is 60 to 65%. The left ventricle has normal function. The left ventricle has no regional wall motion abnormalities. The average left ventricular global longitudinal strain is -21.6 %. The global longitudinal strain is normal. The left ventricular internal cavity size was normal in size. There is no left ventricular hypertrophy. Left ventricular diastolic parameters are consistent with Grade I diastolic dysfunction (impaired relaxation). Right Ventricle: Lateral annulus velocity: 16.4 cm/s. The right ventricular size is normal. No increase in right ventricular wall thickness. Right ventricular systolic function is normal. Left Atrium: Left atrial size was normal in size. Right Atrium: Right atrial size was normal in size. Pericardium: There is no evidence of pericardial effusion. Mitral Valve: The mitral valve is normal in structure. Normal mobility of the mitral valve leaflets. Trivial mitral valve regurgitation. No evidence of mitral valve stenosis. Tricuspid Valve: The tricuspid valve is normal in structure.  Tricuspid valve regurgitation is trivial. No evidence of tricuspid stenosis. Aortic Valve: The aortic valve is normal in structure. Aortic valve regurgitation is not visualized. No aortic stenosis is present. Aortic valve mean gradient measures 5.0 mmHg. Aortic valve peak gradient measures 9.5 mmHg. Aortic valve area, by VTI measures 2.78 cm. Pulmonic Valve: The pulmonic valve was normal in structure. Pulmonic valve regurgitation is not visualized. No evidence of pulmonic stenosis. Aorta: The aortic root is normal in size and structure. Venous: The  inferior vena cava is normal in size with greater than 50% respiratory variability, suggesting right atrial pressure of 3 mmHg. IAS/Shunts: No atrial level shunt detected by color flow Doppler.  LEFT VENTRICLE PLAX 2D LVIDd:         3.90 cm  Diastology LVIDs:         2.40 cm  LV e' lateral:   6.96 cm/s LV PW:         1.10 cm  LV E/e' lateral: 14.8 LV IVS:        1.10 cm  LV e' medial:    5.87 cm/s LVOT diam:     2.00 cm  LV E/e' medial:  17.5 LV SV:         78 LV SV Index:   38       2D Longitudinal Strain LVOT Area:     3.14 cm 2D Strain GLS Avg:     -21.6 %  RIGHT VENTRICLE RV S prime:     16.40 cm/s TAPSE (M-mode): 2.1 cm LEFT ATRIUM             Index       RIGHT ATRIUM           Index LA diam:        4.20 cm 2.03 cm/m  RA Area:     17.40 cm LA Vol (A2C):   65.5 ml 31.66 ml/m RA Volume:   42.70 ml  20.64 ml/m LA Vol (A4C):   61.9 ml 29.92 ml/m LA Biplane Vol: 64.6 ml 31.23 ml/m  AORTIC VALVE AV Area (Vmax):    2.35 cm AV Area (Vmean):   2.52 cm AV Area (VTI):     2.78 cm AV Vmax:           154.00 cm/s AV Vmean:          110.000 cm/s AV VTI:            0.279 m AV Peak Grad:      9.5 mmHg AV Mean Grad:      5.0 mmHg LVOT Vmax:         115.00 cm/s LVOT Vmean:        88.300 cm/s LVOT VTI:          0.247 m LVOT/AV VTI ratio: 0.89  AORTA Ao Root diam: 3.20 cm MITRAL VALVE MV Area (PHT): 3.50 cm     SHUNTS MV Decel Time: 217 msec     Systemic VTI:  0.25 m MV E  velocity: 103.00 cm/s  Systemic Diam: 2.00 cm MV A velocity: 109.00 cm/s MV E/A ratio:  0.94 Candee Furbish MD Electronically signed by Candee Furbish MD Signature Date/Time: 10/24/2019/12:16:00 PM    Final     Labs:  CBC: Recent Labs    10/17/19 1529 11/07/19 1022  WBC 6.9 8.5  HGB 11.8* 12.2  HCT 34.3* 35.7*  PLT 269 276    COAGS: Recent Labs    11/07/19 1022  INR 1.1    BMP: Recent Labs    10/17/19 1529  NA 141  K 4.0  CL 106  CO2 24  GLUCOSE 87  BUN 10  CALCIUM 8.9  CREATININE 0.95  GFRNONAA >60  GFRAA >60    LIVER FUNCTION TESTS: Recent Labs    10/17/19 1529  BILITOT 1.1  AST 16  ALT 13  ALKPHOS 66  PROT 7.8  ALBUMIN 4.0    TUMOR MARKERS: No results for input(s): AFPTM, CEA, CA199, CHROMGRNA in  the last 8760 hours.  Assessment and Plan:  44 y.o. female with history of newly diagnosed right breast carcinoma who presents today for Port-A-Cath placement for chemotherapy.  Imaging studies have revealed: 1. Large right breast mass with metastatic right axillary adenopathy. 2. Ill-defined low-attenuation lesion in segment 4 of the liver is worrisome for metastatic disease. Further evaluation with MR abdomen without and with contrast could be performed, as clinically indicated. 3. Lateral left breast nodule. Patient recently underwent sonographic and MRI evaluation on 09/06/2019 and 10/27/2019, respectively. 4. Enlarged, heterogeneous and fibroid uterus which may cause mild bilateral ureteral dilatation, left greater than right. 5. Tiny subpleural pulmonary nodules are nonspecific and not felt to be metastatic. Continued attention on follow-up exams is suggested.  Risks and benefits of image guided port-a-catheter placement was discussed with the patient including, but not limited to bleeding, infection, pneumothorax, or fibrin sheath development and need for additional procedures.  All of the patient's questions were answered, patient is agreeable to  proceed. Consent signed and in chart.   Thank you for this interesting consult.  I greatly enjoyed meeting Krista [name] and look forward to participating in their care.  A copy of this report was sent to the requesting provider on this date.  Electronically Signed: D. Rowe Robert, PA-C 11/07/2019, 11:11 AM   I spent a total of  25 minutes   in face to face in clinical consultation, greater than 50% of which was counseling/coordinating care for Port-A-Cath placement

## 2019-11-07 NOTE — Procedures (Signed)
Interventional Radiology Procedure:   Indications: Right breast cancer  Procedure: Port placement  Findings: Lefty jugular port, tip at SVC/RA junction  Complications: None     EBL: Minimal, less than 10 ml  Plan: Discharge after infusion.    Krista [name] R. Anselm Pancoast, MD  Pager: [phone]

## 2019-11-07 NOTE — Discharge Instructions (Signed)
DO NOT APPLY ANY EMLA CREAM OR LOTIONS TO THE SITE FOR THE NEXT 2 weeks ; For any questions or concerns call [phone] ; for after hours call [phone] and ask for on call MD    Moderate Conscious Sedation, Adult, Care After  These instructions provide you with information about caring for yourself after your procedure. Your health care provider may also give you more specific instructions. Your treatment has been planned according to current medical practices, but problems sometimes occur. Call your health care provider if you have any problems or questions after your procedure. What can I expect after the procedure? After your procedure, it is common:  To feel sleepy for several hours.  To feel clumsy and have poor balance for several hours.  To have poor judgment for several hours.  To vomit if you eat too soon. Follow these instructions at home: For at least 24 hours after the procedure:   Do not: ? Participate in activities where you could fall or become injured. ? Drive. ? Use heavy machinery. ? Drink alcohol. ? Take sleeping pills or medicines that cause drowsiness. ? Make important decisions or sign legal documents. ? Take care of children on your own.  Rest. Eating and drinking  Follow the diet recommended by your health care provider.  If you vomit: ? Drink water, juice, or soup when you can drink without vomiting. ? Make sure you have little or no nausea before eating solid foods. General instructions  Have a responsible adult stay with you until you are awake and alert.  Take over-the-counter and prescription medicines only as told by your health care provider.  If you smoke, do not smoke without supervision.  Keep all follow-up visits as told by your health care provider. This is important. Contact a health care provider if:  You keep feeling nauseous or you keep vomiting.  You feel light-headed.  You develop a rash.  You have a fever. Get  help right away if:  You have trouble breathing. This information is not intended to replace advice given to you by your health care provider. Make sure you discuss any questions you have with your health care provider. Document Revised: 05/29/2017 Document Reviewed: 10/06/2015 Elsevier Patient Education  [place] Insertion, Care After This sheet gives you information about how to care for yourself after your procedure. Your health care provider may also give you more specific instructions. If you have problems or questions, contact your health care provider. What can I expect after the procedure? After the procedure, it is common to have:  Discomfort at the port insertion site.  Bruising on the skin over the port. This should improve over 3-4 days. Follow these instructions at home: [facility] care  After your port is placed, you will get a manufacturer's information card. The card has information about your port. Keep this card with you at all times.  Take care of the port as told by your health care provider. Ask your health care provider if you or a family member can get training for taking care of the port at home. A home health care nurse may also take care of the port.  Make sure to remember what type of port you have. Incision care      Follow instructions from your health care provider about how to take care of your port insertion site. Make sure you: ? Wash your hands with soap and water before and after you change your bandage (  dressing). If soap and water are not available, use hand sanitizer. ? Change your dressing as told by your health care provider. ? Leave stitches (sutures), skin glue, or adhesive strips in place. These skin closures may need to stay in place for 2 weeks or longer. If adhesive strip edges start to loosen and curl up, you may trim the loose edges. Do not remove adhesive strips completely unless your health care provider tells you to  do that.  Check your port insertion site every day for signs of infection. Check for: ? Redness, swelling, or pain. ? Fluid or blood. ? Warmth. ? Pus or a bad smell. Activity  Return to your normal activities as told by your health care provider. Ask your health care provider what activities are safe for you.  Do not lift anything that is heavier than 10 lb (4.5 kg), or the limit that you are told, until your health care provider says that it is safe. General instructions  Take over-the-counter and prescription medicines only as told by your health care provider.  Do not take baths, swim, or use a hot tub until your health care provider approves. Ask your health care provider if you may take showers. You may only be allowed to take sponge baths.  Do not drive for 24 hours if you were given a sedative during your procedure.  Wear a medical alert bracelet in case of an emergency. This will tell any health care providers that you have a port.  Keep all follow-up visits as told by your health care provider. This is important. Contact a health care provider if:  You cannot flush your port with saline as directed, or you cannot draw blood from the port.  You have a fever or chills.  You have redness, swelling, or pain around your port insertion site.  You have fluid or blood coming from your port insertion site.  Your port insertion site feels warm to the touch.  You have pus or a bad smell coming from the port insertion site. Get help right away if:  You have chest pain or shortness of breath.  You have bleeding from your port that you cannot control. Summary  Take care of the port as told by your health care provider. Keep the manufacturer's information card with you at all times.  Change your dressing as told by your health care provider.  Contact a health care provider if you have a fever or chills or if you have redness, swelling, or pain around your port insertion  site.  Keep all follow-up visits as told by your health care provider. This information is not intended to replace advice given to you by your health care provider. Make sure you discuss any questions you have with your health care provider. Document Revised: 01/12/2018 Document Reviewed: 01/12/2018 Elsevier Patient Education  [place].

## 2019-11-08 ENCOUNTER — Other Ambulatory Visit: Payer: BC Managed Care – PPO

## 2019-11-08 ENCOUNTER — Other Ambulatory Visit: Payer: Self-pay | Admitting: Oncology

## 2019-11-08 ENCOUNTER — Encounter: Payer: Self-pay | Admitting: Oncology

## 2019-11-08 ENCOUNTER — Telehealth: Payer: Self-pay | Admitting: Oncology

## 2019-11-08 ENCOUNTER — Encounter: Payer: Self-pay | Admitting: *Deleted

## 2019-11-08 ENCOUNTER — Ambulatory Visit: Payer: BC Managed Care – PPO

## 2019-11-08 ENCOUNTER — Ambulatory Visit: Payer: BC Managed Care – PPO | Admitting: Oncology

## 2019-11-08 DIAGNOSIS — Z171 Estrogen receptor negative status [ER-]: Secondary | ICD-10-CM

## 2019-11-08 DIAGNOSIS — C50211 Malignant neoplasm of upper-inner quadrant of right female breast: Secondary | ICD-10-CM

## 2019-11-08 NOTE — Telephone Encounter (Signed)
Scheduled appts per 5/10 los. Pt confirmed appt date and time.

## 2019-11-09 ENCOUNTER — Other Ambulatory Visit: Payer: BC Managed Care – PPO

## 2019-11-09 ENCOUNTER — Other Ambulatory Visit: Payer: Self-pay

## 2019-11-09 ENCOUNTER — Ambulatory Visit
Admission: RE | Admit: 2019-11-09 | Discharge: 2019-11-09 | Disposition: A | Discharge status: Home / Self Care | Payer: BC Managed Care – PPO | Source: Ambulatory Visit | Attending: Oncology | Admitting: Oncology

## 2019-11-09 ENCOUNTER — Ambulatory Visit: Payer: BC Managed Care – PPO

## 2019-11-09 DIAGNOSIS — C50211 Malignant neoplasm of upper-inner quadrant of right female breast: Secondary | ICD-10-CM

## 2019-11-09 DIAGNOSIS — Z171 Estrogen receptor negative status [ER-]: Secondary | ICD-10-CM

## 2019-11-10 ENCOUNTER — Ambulatory Visit: Payer: BC Managed Care – PPO

## 2019-11-10 ENCOUNTER — Encounter: Payer: Self-pay | Admitting: *Deleted

## 2019-11-11 ENCOUNTER — Other Ambulatory Visit: Payer: Self-pay

## 2019-11-11 ENCOUNTER — Ambulatory Visit (HOSPITAL_COMMUNITY)
Admission: RE | Admit: 2019-11-11 | Discharge: 2019-11-11 | Disposition: A | Discharge status: Home / Self Care | Payer: BC Managed Care – PPO | Source: Ambulatory Visit | Attending: Oncology | Admitting: Oncology

## 2019-11-11 DIAGNOSIS — Z171 Estrogen receptor negative status [ER-]: Secondary | ICD-10-CM | POA: Insufficient documentation

## 2019-11-11 DIAGNOSIS — C50211 Malignant neoplasm of upper-inner quadrant of right female breast: Secondary | ICD-10-CM

## 2019-11-11 MED ORDER — GADOBUTROL 1 MMOL/ML IV SOLN
10.0000 mL | Freq: Once | INTRAVENOUS | Status: AC | PRN
  Administered 2019-11-11: 10 mL via INTRAVENOUS

## 2019-11-14 ENCOUNTER — Encounter: Payer: Self-pay | Admitting: *Deleted

## 2019-11-14 ENCOUNTER — Other Ambulatory Visit: Payer: BC Managed Care – PPO

## 2019-11-14 ENCOUNTER — Other Ambulatory Visit: Payer: Self-pay | Admitting: Oncology

## 2019-11-14 ENCOUNTER — Encounter: Payer: Self-pay | Admitting: Oncology

## 2019-11-14 ENCOUNTER — Ambulatory Visit: Payer: BC Managed Care – PPO | Admitting: Oncology

## 2019-11-14 DIAGNOSIS — Z171 Estrogen receptor negative status [ER-]: Secondary | ICD-10-CM

## 2019-11-14 DIAGNOSIS — C50211 Malignant neoplasm of upper-inner quadrant of right female breast: Secondary | ICD-10-CM

## 2019-11-15 ENCOUNTER — Inpatient Hospital Stay: Payer: BC Managed Care – PPO

## 2019-11-15 NOTE — Progress Notes (Signed)
Nutrition Assessment   Reason for Assessment:   Patient request   ASSESSMENT:  44 year old female with triple negative breast cancer.  Past medical history of anxiety.  Planning neoadjuvant chemotherapy, followed by surgery and radiation.   Spoke with patient via phone for nutrition consult.  Patient with questions regarding appropriate diet to consume for breast cancer and supplements.     Medications: MVI, ativan, compazine   Labs: reviewed   Anthropometrics:   Height: 67 inches Weight: 209 lb 3.2 oz on 5/10 BMI: 32   NUTRITION DIAGNOSIS: Food and nutrition related knowledge deficit related to diagnosis of cancer as evidenced by patient with questions regarding diet and supplementation   INTERVENTION:  Discussed current recommendations regarding diet and supplementation from [employer] for [place] recommendations.  Encouraged patient to review information from [email or web address] regarding herbal products. Cautioned against use during treatment and to discuss with MD.   Contact information provided    Next Visit: patient to reach out as needed with questions/concerns during treatment  Krista [name], [place], [place] Registered Dietitian [phone] (pager)

## 2019-11-15 NOTE — Progress Notes (Signed)
[facility]  [phone] [phone]) [phone]     ID: Krista [name] DOB: 1976/03/30  MR#: [id number]  KGU#:[id number]  Patient Care Team: Patient, No Pcp Per as PCP - General (General Practice) Nas Wafer, [name], MD as Consulting Physician (Oncology) Erroll Luna, MD as Consulting Physician (General Surgery) Eppie Gibson, MD as Attending Physician (Radiation Oncology) Rockwell Germany, RN as Oncology Nurse Navigator Mauro Kaufmann, RN as Oncology Nurse Navigator Chauncey Cruel, MD OTHER MD:  CHIEF COMPLAINT: Triple negative breast cancer  CURRENT TREATMENT: Neoadjuvant chemotherapy   INTERVAL HISTORY: Krista [name] returns today for follow up and treatment of her triple negative breast cancer.  Today is day 1 cycle 1 of 4 planned cycles of dose dense doxorubicin and cyclophosphamide.  Since her last visit, she underwent right axilla ultrasound on 11/09/2019 showing: suspicious low-lying right axillary lymph node corresponding with recent MRI findings.  She proceeded to biopsy of the suspicious lymph node that day. Pathology from the procedure ([id number]) confirmed metastatic carcinoma.  She also underwent liver MRI on 11/11/2019 showing: targetoid lesion in medial left hepatic lobe with features of metastatic disease; subtle focus of diffusion-related signal along venous branch in left hepatic lobe with no corresponding abnormality on contrasted imaging; no other evidence of metastatic disease.  She is scheduled to begin chemotherapy today.   REVIEW OF SYSTEMS: Krista [name] has been having a difficult time with all the bad news.  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  She understands what is going on emotionally very clearly.  She feels things are "not good" at home and we discussed that at some length today.  She is making sure that she gets the support she needs however.  Today she felt very exposed when her port was being accessed.  There was a man  also being accessed and her breast was partially bared.  They were asking her questions at the same time as they were asking him questions.  There were other issues as well and I have asked our office manager Ms.Danner to meet with her and see if we can improve our care based on these comments.   HISTORY OF CURRENT ILLNESS: From the original intake note:  Krista [name] [facility] [name]"] herself palpated a lump in the upper-inner right breast sometime late December 2020 or early January 2021.  As the mass grew larger and persisted through menstrual periods she brought it to medical attention and underwent bilateral diagnostic mammography with tomography and bilateral breast ultrasonography at [facility] on 09/26/2019 showing: breast density category C; 5.1 cm mass involving upper-inner quadrant of right breast; no evidence of right axillary lymphadenopathy or left breast malignancy.  Accordingly on 10/03/2019 she proceeded to biopsy of the right breast area in question. The pathology from this procedure ([id number]) showed: invasive ductal carcinoma, grade 3. Prognostic indicators significant for: estrogen receptor, 0% negative and progesterone receptor, 0% negative. Proliferation marker Ki67 at 80%. HER2 negative by immunohistochemistry (1+).  The patient's subsequent history is as detailed below.   PAST MEDICAL HISTORY: Past Medical History:  Diagnosis Date   Anxiety    Cancer ([place])    Family history of breast cancer    Family history of cervical cancer    Family history of lung cancer    Hemorrhoids     PAST SURGICAL HISTORY: No prior surgeries  FAMILY HISTORY: Family History  Problem Relation Age of Onset   Breast cancer Mother 18  double mastectomy   Breast cancer Half-Sister 44   Cirrhosis Half-Sister    Heart Problems Father    Stroke Father    Breast cancer Maternal Aunt 76   Cancer Maternal Uncle 65       unknown type   Cervical cancer  Maternal Grandmother        dx. in her mid-40s   Lung cancer Paternal Grandmother    Cancer Other        unknown type; maternal great-uncles/aunts (x3)   Diabetes Half-Brother    Cancer Other        unknown type; matenral great-uncles/aunt (x4)   Cancer Maternal Great-grandfather        unknown type   Breast cancer Cousin 62       paternal cousin  The patient's father died at age 38 from cardiac complications of drug use.  His mother, the patient's paternal grandmother had what seems to have been cancer of the lung metastatic to the spine.  There was no other cancer on his side of the family to the patient's knowledge.  The patient's mother died at age 8 in the setting of multiple medical issues but she had undergone double mastectomy at the age of 33.  Her mother, the patient's maternal grandmother had cervical cancer.  The patient's mother had 3 brothers and 3 sisters.  1 of those 3 sisters had breast cancer diagnosed at the age of 18.  The patient herself has 2 brothers and 3 sisters one of her sisters was diagnosed with breast cancer at the age of 50 and died at the age of 25   GYNECOLOGIC HISTORY:  No LMP recorded. Menarche: 44 years old Age at first live birth: 44 years old [place] P 3 LMP regular, last approximately 5 days, of which the second day is heavy Contraceptive: Husband status post vasectomy HRT no  Hysterectomy? no BSO?  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  Her husband [name] is a Research scientist (medical) and also Librarian, academic at [employer].  Their children are Rowe Pavy, 20, who is Scientist, product/process development at [employer], 18, who will study psychology at [employer] starting the fall 2021; and Alanna, 6, currently attending [employer].  The patient is not a church attender    ADVANCED DIRECTIVES: In the absence of any documents to the contrary the patient's husband is her healthcare power of  attorney   HEALTH MAINTENANCE: Social History   Tobacco Use   Smoking status: Never Smoker   Smokeless tobacco: Never Used  Substance Use Topics   Alcohol use: Not Currently    Comment: Social drinker   Drug use: Never     Colonoscopy: n/a (age)  PAP: UTD  Bone density: n/a (age)   No Known Allergies  Current Outpatient Medications  Medication Sig Dispense Refill   acetaminophen (TYLENOL) 500 MG tablet Take 1-2 tablets (500-1,000 mg total) by mouth every 6 (six) hours as needed. 60 tablet 0   Daily Multiple Vitamins tablet Take 1 tablet by mouth daily.     dexamethasone (DECADRON) 4 MG tablet Take 2 tablets by mouth daily starting the day after Cytoxan x 3 days. Take with food. 30 tablet 1   lidocaine-prilocaine (EMLA) cream Apply to affected area once (Patient not taking: Reported on 10/21/2019) 30 g 3   loratadine (CLARITIN) 10 MG tablet Take 1 tablet (10 mg total) by mouth daily. 60 tablet  0   LORazepam (ATIVAN) 0.5 MG tablet Take 1 tablet (0.5 mg total) by mouth at bedtime as needed (Nausea or vomiting). (Patient not taking: Reported on 10/21/2019) 30 tablet 0   prochlorperazine (COMPAZINE) 10 MG tablet Take 1 tablet (10 mg total) by mouth every 6 (six) hours as needed (Nausea or vomiting). (Patient not taking: Reported on 10/21/2019) 30 tablet 1   No current facility-administered medications for this visit.    OBJECTIVE: African-American woman who appears younger than stated age  52:   11/16/19 0825  BP: (!) 174/98  Pulse: 98  Resp: 18  Temp: 99.1 F (37.3 C)  SpO2: 99%     Body mass index is 32.72 kg/m.   Wt Readings from Last 3 Encounters:  11/16/19 208 lb 14.4 oz (94.8 kg)  11/07/19 209 lb 3.2 oz (94.9 kg)  10/17/19 211 lb 4.8 oz (95.8 kg)      ECOG FS:1 - Symptomatic but completely ambulatory  Sclerae unicteric, EOMs intact Wearing a mask No cervical or supraclavicular adenopathy Lungs no rales or rhonchi Heart regular rate and  rhythm Abd soft, nontender, positive bowel sounds MSK no focal spinal tenderness, no upper extremity lymphedema Neuro: nonfocal, well oriented, appropriate affect Breasts: The mass in the upper inner quadrant of the right breast is easily palpable, distorts the breast contour, is not associated with erythema.  I do not palpate an obvious axillary node.  The left breast and left axilla are benign.   LAB RESULTS:  CMP     Component Value Date/Time   NA 141 11/16/2019 0815   K 3.8 11/16/2019 0815   CL 108 11/16/2019 0815   CO2 23 11/16/2019 0815   GLUCOSE 102 (H) 11/16/2019 0815   BUN 10 11/16/2019 0815   CREATININE 0.86 11/16/2019 0815   CREATININE 0.95 10/17/2019 1529   CALCIUM 9.1 11/16/2019 0815   PROT 7.7 11/16/2019 0815   ALBUMIN 3.9 11/16/2019 0815   AST 18 11/16/2019 0815   AST 16 10/17/2019 1529   ALT 12 11/16/2019 0815   ALT 13 10/17/2019 1529   ALKPHOS 67 11/16/2019 0815   BILITOT 1.2 11/16/2019 0815   BILITOT 1.1 10/17/2019 1529   GFRNONAA >60 11/16/2019 0815   GFRNONAA >60 10/17/2019 1529   GFRAA >60 11/16/2019 0815   GFRAA >60 10/17/2019 1529    No results found for: TOTALPROTELP, ALBUMINELP, A1GS, A2GS, BETS, BETA2SER, GAMS, MSPIKE, SPEI  Lab Results  Component Value Date   WBC 8.2 11/16/2019   NEUTROABS 5.3 11/16/2019   HGB 11.8 (L) 11/16/2019   HCT 33.4 (L) 11/16/2019   MCV 69.6 (L) 11/16/2019   PLT 234 11/16/2019    No results found for: LABCA2  No components found for: TMAUQJ335  No results for input(s): INR in the last 168 hours.  No results found for: LABCA2  No results found for: KTG256  No results found for: LSL373  No results found for: SKA768  No results found for: CA2729  No components found for: HGQUANT  No results found for: CEA1 / No results found for: CEA1   No results found for: AFPTUMOR  No results found for: CHROMOGRNA  No results found for: KPAFRELGTCHN, LAMBDASER, KAPLAMBRATIO (kappa/lambda light chains)  No  results found for: HGBA, HGBA2QUANT, HGBFQUANT, HGBSQUAN (Hemoglobinopathy evaluation)   No results found for: LDH  Lab Results  Component Value Date   IRON 99 10/17/2019   TIBC 391 10/17/2019   IRONPCTSAT 25 10/17/2019   (Iron and TIBC)  Lab Results  Component Value Date   FERRITIN 45 10/17/2019    Urinalysis No results found for: COLORURINE, APPEARANCEUR, LABSPEC, PHURINE, GLUCOSEU, HGBUR, BILIRUBINUR, KETONESUR, PROTEINUR, UROBILINOGEN, NITRITE, LEUKOCYTESUR   STUDIES: CT Chest W Contrast  Result Date: 10/31/2019 CLINICAL DATA:  Breast cancer staging, diagnosed 3 weeks ago. EXAM: CT CHEST, ABDOMEN, AND PELVIS WITH CONTRAST TECHNIQUE: Multidetector CT imaging of the chest, abdomen and pelvis was performed following the standard protocol during bolus administration of intravenous contrast. CONTRAST:  164m OMNIPAQUE IOHEXOL 300 MG/ML  SOLN COMPARISON:  None. FINDINGS: CT CHEST FINDINGS Cardiovascular: Heart is at the upper limits of normal in size to mildly enlarged. No pericardial effusion. Mediastinum/Nodes: Heterogeneous lymph node in the right axilla measures 1.5 cm (2/19). No additional pathologically enlarged axillary, internal mammary, mediastinal or hilar lymph nodes. Esophagus is unremarkable. Lungs/Pleura: 2 mm subpleural posterior right upper lobe nodule (7/52). 1 mm inferior left lower lobe nodule (7/141). No pleural fluid. Airway is unremarkable. Musculoskeletal: Heterogeneous lobulated mass with surrounding nodularity in the right breast measures at least 3.4 x 4.9 cm (2/13). There is a soft tissue nodule in the lateral left breast measuring 1.7 x 2.0 cm (2/22). No worrisome lytic or sclerotic lesions. CT ABDOMEN PELVIS FINDINGS Hepatobiliary: Ill-defined hypodense lesion in segment 4 of the liver measures 1.5 cm (2/59). Liver and gallbladder are otherwise unremarkable. No biliary ductal dilatation. Pancreas: Negative. Spleen: Negative. Adrenals/Urinary Tract: Adrenal glands  and kidneys are unremarkable. Mild dilatation of the ureters bilaterally, left greater than right, possibly due to an enlarged uterus. 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 Bladder is grossly unremarkable. Stomach/Bowel: Stomach, small bowel, appendix and colon are unremarkable. Fair amount of stool in the colon is indicative of constipation. Vascular/Lymphatic: Vascular structures are unremarkable. No pathologically enlarged lymph nodes. Reproductive: Uterus is enlarged and heterogeneous, with a dominant mass measuring approximately 8.6 cm (6/104). Prominent nabothian cysts. No adnexal mass. Other: No free fluid.  Mesenteries and peritoneum are unremarkable. Musculoskeletal: No worrisome lytic or sclerotic lesions. IMPRESSION: 1. Large right breast mass with metastatic right axillary adenopathy. 2. Ill-defined low-attenuation lesion in segment 4 of the liver is worrisome for metastatic disease. Further evaluation with MR abdomen without and with contrast could be performed, as clinically indicated. 3. Lateral left breast nodule. Patient recently underwent sonographic and MRI evaluation on 09/06/2019 and 10/27/2019, respectively. 4. Enlarged, heterogeneous and fibroid uterus which may cause mild bilateral ureteral dilatation, left greater than right. 5. Tiny subpleural pulmonary nodules are nonspecific and not felt to be metastatic. Continued attention on follow-up exams is suggested. Electronically Signed   By: MLorin PicketM.D.   On: 10/31/2019 11:45   NM Bone Scan Whole Body  Result Date: 10/31/2019 CLINICAL DATA:  RIGHT breast cancer, staging EXAM: NUCLEAR MEDICINE WHOLE BODY BONE SCAN TECHNIQUE: Whole body anterior and posterior images were obtained approximately 3 hours after intravenous injection of radiopharmaceutical. RADIOPHARMACEUTICALS:  21 mCi Technetium-974mDP IV COMPARISON:  None Correlation: CT chest abdomen pelvis 10/31/2019 FINDINGS:  Symmetric calvarial uptake, nonspecific. Uptake at the shoulders, sternoclavicular joints, hips, and knees, typically degenerative. No definite sites of abnormal osseous tracer uptake are seen to suggest osseous metastatic disease. Small amount retained tracer within LEFT renal collecting system, corresponding to mildly dilated duplicated collecting system/proximal ureters seen on CT. Remaining urinary tract and soft tissue distribution of tracer unremarkable. IMPRESSION: No scintigraphic evidence of osseous metastatic disease. Electronically Signed   By: MaLavonia Dana.D.   On: 10/31/2019 16:15  CT Abdomen Pelvis W Contrast  Result Date: 10/31/2019 CLINICAL DATA:  Breast cancer staging, diagnosed 3 weeks ago. EXAM: CT CHEST, ABDOMEN, AND PELVIS WITH CONTRAST TECHNIQUE: Multidetector CT imaging of the chest, abdomen and pelvis was performed following the standard protocol during bolus administration of intravenous contrast. CONTRAST:  148m OMNIPAQUE IOHEXOL 300 MG/ML  SOLN COMPARISON:  None. FINDINGS: CT CHEST FINDINGS Cardiovascular: Heart is at the upper limits of normal in size to mildly enlarged. No pericardial effusion. Mediastinum/Nodes: Heterogeneous lymph node in the right axilla measures 1.5 cm (2/19). No additional pathologically enlarged axillary, internal mammary, mediastinal or hilar lymph nodes. Esophagus is unremarkable. Lungs/Pleura: 2 mm subpleural posterior right upper lobe nodule (7/52). 1 mm inferior left lower lobe nodule (7/141). No pleural fluid. Airway is unremarkable. Musculoskeletal: Heterogeneous lobulated mass with surrounding nodularity in the right breast measures at least 3.4 x 4.9 cm (2/13). There is a soft tissue nodule in the lateral left breast measuring 1.7 x 2.0 cm (2/22). No worrisome lytic or sclerotic lesions. CT ABDOMEN PELVIS FINDINGS Hepatobiliary: Ill-defined hypodense lesion in segment 4 of the liver measures 1.5 cm (2/59). Liver and gallbladder are otherwise  unremarkable. No biliary ductal dilatation. Pancreas: Negative. Spleen: Negative. Adrenals/Urinary Tract: Adrenal glands and kidneys are unremarkable. Mild dilatation of the ureters bilaterally, left greater than right, possibly due to an enlarged uterus. 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 Bladder is grossly unremarkable. Stomach/Bowel: Stomach, small bowel, appendix and colon are unremarkable. Fair amount of stool in the colon is indicative of constipation. Vascular/Lymphatic: Vascular structures are unremarkable. No pathologically enlarged lymph nodes. Reproductive: Uterus is enlarged and heterogeneous, with a dominant mass measuring approximately 8.6 cm (6/104). Prominent nabothian cysts. No adnexal mass. Other: No free fluid.  Mesenteries and peritoneum are unremarkable. Musculoskeletal: No worrisome lytic or sclerotic lesions. IMPRESSION: 1. Large right breast mass with metastatic right axillary adenopathy. 2. Ill-defined low-attenuation lesion in segment 4 of the liver is worrisome for metastatic disease. Further evaluation with MR abdomen without and with contrast could be performed, as clinically indicated. 3. Lateral left breast nodule. Patient recently underwent sonographic and MRI evaluation on 09/06/2019 and 10/27/2019, respectively. 4. Enlarged, heterogeneous and fibroid uterus which may cause mild bilateral ureteral dilatation, left greater than right. 5. Tiny subpleural pulmonary nodules are nonspecific and not felt to be metastatic. Continued attention on follow-up exams is suggested. Electronically Signed   By: MLorin PicketM.D.   On: 10/31/2019 11:45   MR LIVER W WO CONTRAST  Result Date: 11/12/2019 CLINICAL DATA:  Breast cancer staging. EXAM: MRI ABDOMEN WITHOUT AND WITH CONTRAST TECHNIQUE: Multiplanar multisequence MR imaging of the abdomen was performed both before and after the administration of intravenous contrast. CONTRAST:   143mGADAVIST GADOBUTROL 1 MMOL/ML IV SOLN COMPARISON:  Chest abdomen and pelvis 10/31/2019 FINDINGS: Lower chest: Incidental imaging of the lung bases is unremarkable, limited assessment on MRI. Hepatobiliary: Targetoid lesion in the medial segment of the LEFT hepatic lobe (image 17, series 5 and image 58 of series 8 displays restricted diffusion. Lesion displays peripheral enhancement, central necrosis and peripheral washout. Additional tiny focus of diffusion related signal, see below. Pancreas: Pancreas without sign of inflammation, focal lesion or ductal dilation. Spleen:  Spleen normal size without focal lesion. Adrenals/Urinary Tract: Adrenal glands are normal. Smooth renal contours. Stomach/Bowel: Stomach is distended with ingested contents. No acute gastrointestinal process to the extent evaluated, study not protocol for bowel evaluation. Normal appendix. Vascular/Lymphatic: Vascular structures  in the abdomen are patent. No adenopathy. 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 No ascites. Small fat containing umbilical hernia. Musculoskeletal: No acute musculoskeletal process. IMPRESSION: 1. Targetoid lesion in the medial segment of the LEFT hepatic lobe displays restricted diffusion with target like enhancement with features of metastatic disease. Tissue sampling may be helpful. 2. Subtle focus of diffusion related signal along a venous branch in the lateral segment LEFT hepatic lobe just above the inter lobar fissure (series 8, image 11). No corresponding abnormality on contrasted imaging. This could represent slow flow in a venous branch in plane with the scan or a small focus of disease. Close attention on follow-up is suggested. 3. No other evidence of metastatic disease within the abdomen. 4. 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 Electronically Signed   By: Zetta Bills M.D.   On: 11/12/2019 15:31   MR BREAST BILATERAL W WO CONTRAST INC CAD  Result  Date: 10/27/2019 CLINICAL DATA:  44 year old female with recently diagnosed grade 3 invasive ductal carcinoma of the right breast post ultrasound-guided core biopsy of a 5.1 cm palpable mass at the 1 o'clock position. The patient does have a very strong family history of breast cancer, including in her mother, sister as well as a maternal aunt all diagnosed before age 80. LABS:  Not applicable. EXAM: BILATERAL BREAST MRI WITH AND WITHOUT CONTRAST TECHNIQUE: Multiplanar, multisequence MR images of both breasts were obtained prior to and following the intravenous administration of 9 ml of Gadavist Three-dimensional MR images were rendered by post-processing of the original MR data on an independent workstation. The three-dimensional MR images were interpreted, and findings are reported in the following complete MRI report for this study. Three dimensional images were evaluated at the independent DynaCad workstation COMPARISON:  Prior mammograms and ultrasounds dated 09/26/2019 and 10/03/2019. FINDINGS: Breast composition: c.  Heterogeneous fibroglandular tissue. Background parenchymal enhancement: Mild. Right breast: Heterogeneously enhancing partially necrotic mass at site of biopsy proven malignancy in the upper inner right breast measures up to 7.9 cm AP, 4.6 cm transverse, and 4.6 cm craniocaudal. Several small satellite masses are present along the inferior margin of the dominant mass (subtraction images 68-70. In addition, there is linear oriented non mass enhancement (subtraction image 79) in the lower inner right breast measuring 3.5 cm AP. Left breast: No mass or abnormal enhancement. Lymph nodes: There is a morphologically abnormal level I in the right axilla (series 3, image 46) measuring up to 2.2 cm. Ancillary findings:  None. IMPRESSION: 1. Biopsy proven malignancy in the upper inner right breast with small adjacent satellite masses along the inferior margin measures up to 7.9 x 4.6 x 4.6 cm. 2.  Suspicious linear oriented enhancement in the lower inner quadrant of the right breast slightly inferior to the dominant mass measures up to 3.5 cm. 3.  Morphologically abnormal level I lymph node in the right axilla. 4.  No MRI evidence of malignancy in the left breast. RECOMMENDATION: 1. Recommend the patient return for second-look ultrasound and biopsy of the morphologically abnormal lymph node in the right axilla. 2. If breast conservation is a consideration, then recommend the patient return for MRI guided biopsy of the linear non mass enhancement in the lower inner quadrant of the right breast (subtraction image 79). BI-RADS CATEGORY  4: Suspicious. Electronically Signed   By: Everlean Alstrom M.D.   On: 10/27/2019 15:51   ECHOCARDIOGRAM COMPLETE  Result Date: 10/24/2019    ECHOCARDIOGRAM REPORT   Patient Name:  Barrett Edgeworth Date of Exam: 10/24/2019 Medical Rec #:  [id number]                  Height:       67.0 in Accession #:    [id number]                 Weight:       211.0 lb Date of Birth:  02/16/1976                  BSA:          2.069 m Patient Age:    52 years                   BP:           154/108 mmHg Patient Gender: F                          HR:           79 bpm. Exam Location:  Outpatient Procedure: 2D Echo Indications:    Chemo V67.2 / Z09  History:        Patient has no prior history of Echocardiogram examinations.                 Breast cancer.  Sonographer:    Vikki Ports Turrentine Referring Phys: [place]  1. Left ventricular ejection fraction, by estimation, is 60 to 65%. The left ventricle has normal function. The left ventricle has no regional wall motion abnormalities. Left ventricular diastolic parameters are consistent with Grade I diastolic dysfunction (impaired relaxation). The average left ventricular global longitudinal strain is -21.6 %. The global longitudinal strain is normal.  2. Lateral annulus velocity: 16.4 cm/s. Right ventricular  systolic function is normal. The right ventricular size is normal.  3. The mitral valve is normal in structure. Trivial mitral valve regurgitation. No evidence of mitral stenosis.  4. The aortic valve is normal in structure. Aortic valve regurgitation is not visualized. No aortic stenosis is present.  5. The inferior vena cava is normal in size with greater than 50% respiratory variability, suggesting right atrial pressure of 3 mmHg. FINDINGS  Left Ventricle: Left ventricular ejection fraction, by estimation, is 60 to 65%. The left ventricle has normal function. The left ventricle has no regional wall motion abnormalities. The average left ventricular global longitudinal strain is -21.6 %. The global longitudinal strain is normal. The left ventricular internal cavity size was normal in size. There is no left ventricular hypertrophy. Left ventricular diastolic parameters are consistent with Grade I diastolic dysfunction (impaired relaxation). Right Ventricle: Lateral annulus velocity: 16.4 cm/s. The right ventricular size is normal. No increase in right ventricular wall thickness. Right ventricular systolic function is normal. Left Atrium: Left atrial size was normal in size. Right Atrium: Right atrial size was normal in size. Pericardium: There is no evidence of pericardial effusion. Mitral Valve: The mitral valve is normal in structure. Normal mobility of the mitral valve leaflets. Trivial mitral valve regurgitation. No evidence of mitral valve stenosis. Tricuspid Valve: The tricuspid valve is normal in structure. Tricuspid valve regurgitation is trivial. No evidence of tricuspid stenosis. Aortic Valve: The aortic valve is normal in structure. Aortic valve regurgitation is not visualized. No aortic stenosis is present. Aortic valve mean gradient measures 5.0 mmHg. Aortic valve peak gradient measures 9.5 mmHg. Aortic valve area, by VTI measures 2.78 cm. Pulmonic Valve:  The pulmonic valve was normal in structure.  Pulmonic valve regurgitation is not visualized. No evidence of pulmonic stenosis. Aorta: The aortic root is normal in size and structure. Venous: The inferior vena cava is normal in size with greater than 50% respiratory variability, suggesting right atrial pressure of 3 mmHg. IAS/Shunts: No atrial level shunt detected by color flow Doppler.  LEFT VENTRICLE PLAX 2D LVIDd:         3.90 cm  Diastology LVIDs:         2.40 cm  LV e' lateral:   6.96 cm/s LV PW:         1.10 cm  LV E/e' lateral: 14.8 LV IVS:        1.10 cm  LV e' medial:    5.87 cm/s LVOT diam:     2.00 cm  LV E/e' medial:  17.5 LV SV:         78 LV SV Index:   38       2D Longitudinal Strain LVOT Area:     3.14 cm 2D Strain GLS Avg:     -21.6 %  RIGHT VENTRICLE RV S prime:     16.40 cm/s TAPSE (M-mode): 2.1 cm LEFT ATRIUM             Index       RIGHT ATRIUM           Index LA diam:        4.20 cm 2.03 cm/m  RA Area:     17.40 cm LA Vol (A2C):   65.5 ml 31.66 ml/m RA Volume:   42.70 ml  20.64 ml/m LA Vol (A4C):   61.9 ml 29.92 ml/m LA Biplane Vol: 64.6 ml 31.23 ml/m  AORTIC VALVE AV Area (Vmax):    2.35 cm AV Area (Vmean):   2.52 cm AV Area (VTI):     2.78 cm AV Vmax:           154.00 cm/s AV Vmean:          110.000 cm/s AV VTI:            0.279 m AV Peak Grad:      9.5 mmHg AV Mean Grad:      5.0 mmHg LVOT Vmax:         115.00 cm/s LVOT Vmean:        88.300 cm/s LVOT VTI:          0.247 m LVOT/AV VTI ratio: 0.89  AORTA Ao Root diam: 3.20 cm MITRAL VALVE MV Area (PHT): 3.50 cm     SHUNTS MV Decel Time: 217 msec     Systemic VTI:  0.25 m MV E velocity: 103.00 cm/s  Systemic Diam: 2.00 cm MV A velocity: 109.00 cm/s MV E/A ratio:  0.94 Candee Furbish MD Electronically signed by Candee Furbish MD Signature Date/Time: 10/24/2019/12:16:00 PM    Final    [place] AXILLARY NODE CORE BIOPSY RIGHT  Result Date: 11/09/2019 CLINICAL DATA:  44 year old female with recently diagnosed right breast cancer presents for biopsy of an enlarged axillary lymph node. EXAM:  [place] AXILLARY NODE CORE BIOPSY RIGHT COMPARISON:  Previous exam(s). PROCEDURE: I met with the patient and we discussed the procedure of ultrasound-guided biopsy, including benefits and alternatives. We discussed the high likelihood of a successful procedure. We discussed the risks of the procedure, including infection, bleeding, tissue injury, clip migration, and inadequate sampling. Informed written consent was given. The usual time-out protocol was performed immediately prior  to the procedure. Using sterile technique and 1% Lidocaine as local anesthetic, under direct ultrasound visualization, a 14 gauge spring-loaded device was used to perform biopsy of a low lying right axillary lymph node using a lateral approach. At the conclusion of the procedure a post biopsy tissue marker clip was deployed into the biopsy cavity. Follow up 2 view mammogram was deferred. IMPRESSION: Ultrasound guided biopsy of a right axillary lymph node. No apparent complications. Electronically Signed   By: Kristopher Oppenheim M.D.   On: 11/09/2019 15:43   [place] AXILLA RIGHT  Result Date: 11/09/2019 CLINICAL DATA:  Patient presents for second-look evaluation of a prominent right axillary lymph node seen on recent staging MRI. EXAM: ULTRASOUND OF THE RIGHT AXILLA COMPARISON:  Previous exam(s). FINDINGS: Targeted ultrasound is performed, showing a single morphologically abnormal low lying right axillary lymph node. The node measures up to 2 cm with 1.1 cm of diffuse cortical thickening. IMPRESSION: Suspicious low lying right axillary lymph node corresponding with recent MRI findings. RECOMMENDATION: Recommendation is for ultrasound-guided biopsy of above findings. This was performed to follow. Please see additional dictation. I have discussed the findings and recommendations with the patient. If applicable, a reminder letter will be sent to the patient regarding the next appointment. BI-RADS CATEGORY  4: Suspicious. Electronically Signed   By:  Kristopher Oppenheim M.D.   On: 11/09/2019 15:44   IR IMAGING GUIDED PORT INSERTION  Result Date: 11/07/2019 INDICATION: 44 year old with right breast cancer. Port-A-Cath needed for chemotherapy. EXAM: FLUOROSCOPIC AND ULTRASOUND GUIDED PLACEMENT OF A SUBCUTANEOUS PORT. Physician: Stephan Minister. Henn, MD MEDICATIONS: Ancef 2 g, antibiotics were given within 1 hour of the procedure. ANESTHESIA/SEDATION: Versed 4.0 mg IV; Fentanyl 100 mcg IV; Moderate Sedation Time:  49 minutes The patient was continuously monitored during the procedure by the interventional radiology nurse under my direct supervision. FLUOROSCOPY TIME:  2 minutes, 12 seconds; 17 mGy COMPLICATIONS: None immediate. PROCEDURE: The procedure was explained to the patient. The risks and benefits of the procedure were discussed and the patient's questions were addressed. Informed consent was obtained from the patient. Patient was placed supine on the interventional table. Ultrasound confirmed a patent left internal jugular vein. The left chest and neck were cleaned with a skin antiseptic and a sterile drape was placed. Maximal barrier sterile technique was utilized including caps, mask, sterile gowns, sterile gloves, sterile drape, hand hygiene and skin antiseptic. The left neck was anesthetized with 1% lidocaine. Small incision was made in the left neck with a blade. Micropuncture set was placed in the left IJ with ultrasound guidance. The micropuncture wire was used for measurement purposes. The left chest was anesthetized with 1% lidocaine with epinephrine. #15 blade was used to make an incision and a subcutaneous port pocket was formed. Darke was assembled. Subcutaneous tunnel was formed with a stiff tunneling device. The port catheter was brought through the subcutaneous tunnel. The port was placed in the subcutaneous pocket. The micropuncture set was exchanged for a peel-away sheath. The catheter was placed through the peel-away sheath and the  tip was noted to be in the upper SVC. This placement was not felt to be adequate for long-term catheter placement. Unable to remove the catheter through the vein dermatotomy site. Therefore, the entire catheter was removed. Using ultrasound guidance, a 21 gauge needle was directed into the left internal jugular vein and wire was advanced centrally. Another micropuncture dilator set was placed. A new 8 French port catheter was tunneled through the subcutaneous  tract to the vein dermatotomy site. The micropuncture catheter was exchanged for a peel-away sheath over a J wire. Catheter was advanced through the peel-away sheath and directed into the central venous system. Catheter was pulled back into the upper right atrium region. Catheter was cut to an appropriate length and connected to the port device. Port device was placed in the subcutaneous port pocket. Port was accessed. Port aspirated and flushed well. The port pocket was closed using two layers of absorbable sutures and Dermabond. The vein skin site was closed using a single layer of absorbable suture and Dermabond. Access needle was kept in place after the procedure. Sterile dressings were applied. Patient tolerated the procedure well without an immediate complication. Ultrasound and fluoroscopic images were taken and saved for this procedure. FINDINGS: Patent left internal jugular vein. Initially, the Port-A-Cath tip was in the upper SVC/innominate junction and not adequate for long-term use. Therefore, a new catheter was placed and the tip was placed in the upper right atrium near the SVC and right atrium junction. IMPRESSION: Placement of a subcutaneous port device. Electronically Signed   By: Markus Daft M.D.   On: 11/07/2019 14:47     ELIGIBLE FOR AVAILABLE RESEARCH PROTOCOL: [id number]  ASSESSMENT: 44 y.o. [place] woman status post right breast upper inner quadrant biopsy 10/03/2019 for a clinical mT3 N1, stage IIIC invasive ductal carcinoma, grade 3,  triple negative, with an MIB-1 of 80%  (a) biopsy of a right axillary node  (b) CT chest/abd/pelvis 10/31/2019 shows 1.5 cm hypodense lesion in liver, lung nodules <0.3 cm  (c) bone scan 10/31/2019 negative  (d) liver MRI 11/11/2019 confirms a targetoid lesion in the left hepatic lobe suspicious for metastasis  (e) baseline CA 27-29 on 11/16/2019  (1) genetics testing   (2) neoadjuvant chemotherapy to consist of cyclophosphamide and doxorubicin in dose dense fashion x4 followed by paclitaxel and carboplatin weekly x12  (a) echo 10/24/2019 shows an ejection fraction in the 60-65% range  (3) definitive surgery to follow  (4) adjuvant radiation  (5) thalassemia: Ferritin 10/17/2019 was 45 with saturation 25%, hemoglobin 11.8 and MCV 71.5   PLAN:  Krista [name] is ready to start chemotherapy today.  She did not use the EMLA cream so we reviewed that today.  I also reprinted her routing sheet so she would know how to interpret the information guiding her on how to take her supportive medications.  All those prescriptions of course are in place.  I have urged her to call us if she has any problem.  We can give her fluids, or directions or otherwise do what ever we can to support her through the first cycle which is always the most complicated one.  I did ask her to keep a diary of symptoms so we can discuss it when she returns to see me 11/22/2019.  We discussed the liver MRI, which does suggest a metastatic deposit.  If so then we are dealing with oligometastatic disease and I will of course we will repeat an MRI of the liver at the end of chemo.  If necessary we can do a partial hepatectomy versus cryotherapy or other forms of local treatment if there are no other sites of metastatic disease.  I am also checking a CA 27-29 at the next visit  Incidentally she very likely carries a thalassemia trait and I will check for that also at the next set of labs  Total encounter time 40 minutes.Sarajane Jews  C. Cambell Rickenbach, MD 11/16/2019  9:04 AM Medical Oncology and Hematology [facility] [place], [place] [address] Tel. [phone]    Fax. [phone]   This document serves as a record of services personally performed by Lurline Del, MD. It was created on his behalf by Wilburn Mylar, a trained medical scribe. The creation of this record is based on the scribe's personal observations and the provider's statements to them.   I, Lurline Del MD, have reviewed the above documentation for accuracy and completeness, and I agree with the above.   *Total Encounter Time as defined by the Centers for Medicare and Medicaid Services includes, in addition to the face-to-face time of a patient visit (documented in the note above) non-face-to-face time: obtaining and reviewing outside history, ordering and reviewing medications, tests or procedures, care coordination (communications with other health care professionals or caregivers) and documentation in the medical record.

## 2019-11-16 ENCOUNTER — Inpatient Hospital Stay: Payer: BC Managed Care – PPO

## 2019-11-16 ENCOUNTER — Telehealth: Payer: Self-pay | Admitting: *Deleted

## 2019-11-16 ENCOUNTER — Other Ambulatory Visit: Payer: Self-pay

## 2019-11-16 ENCOUNTER — Inpatient Hospital Stay (HOSPITAL_BASED_OUTPATIENT_CLINIC_OR_DEPARTMENT_OTHER): Payer: BC Managed Care – PPO | Admitting: Oncology

## 2019-11-16 ENCOUNTER — Encounter: Payer: Self-pay | Admitting: *Deleted

## 2019-11-16 VITALS — BP 174/98 | HR 98 | Temp 99.1°F | Resp 18 | Ht 67.0 in | Wt 208.9 lb

## 2019-11-16 DIAGNOSIS — Z171 Estrogen receptor negative status [ER-]: Secondary | ICD-10-CM

## 2019-11-16 DIAGNOSIS — C50211 Malignant neoplasm of upper-inner quadrant of right female breast: Secondary | ICD-10-CM

## 2019-11-16 DIAGNOSIS — D563 Thalassemia minor: Secondary | ICD-10-CM

## 2019-11-16 DIAGNOSIS — C50411 Malignant neoplasm of upper-outer quadrant of right female breast: Secondary | ICD-10-CM

## 2019-11-16 LAB — COMPREHENSIVE METABOLIC PANEL
ALT: 12 U/L (ref 0–44)
AST: 18 U/L (ref 15–41)
Albumin: 3.9 g/dL (ref 3.5–5.0)
Alkaline Phosphatase: 67 U/L (ref 38–126)
Anion gap: 10 (ref 5–15)
BUN: 10 mg/dL (ref 6–20)
CO2: 23 mmol/L (ref 22–32)
Calcium: 9.1 mg/dL (ref 8.9–10.3)
Chloride: 108 mmol/L (ref 98–111)
Creatinine, Ser: 0.86 mg/dL (ref 0.44–1.00)
GFR calc Af Amer: >60 mL/min (ref >60)
GFR calc non Af Amer: >60 mL/min (ref >60)
Glucose, Bld: 102 mg/dL — ABNORMAL HIGH (ref 70–99)
Potassium: 3.8 mmol/L (ref 3.5–5.1)
Sodium: 141 mmol/L (ref 135–145)
Total Bilirubin: 1.2 mg/dL (ref 0.3–1.2)
Total Protein: 7.7 g/dL (ref 6.5–8.1)

## 2019-11-16 LAB — CBC WITH DIFFERENTIAL/PLATELET
Abs Immature Granulocytes: 0.02 10*3/uL (ref 0.00–0.07)
Basophils Absolute: 0.1 10*3/uL (ref 0.0–0.1)
Basophils Relative: 1 %
Eosinophils Absolute: 0.2 10*3/uL (ref 0.0–0.5)
Eosinophils Relative: 3 %
HCT: 33.4 % — ABNORMAL LOW (ref 36.0–46.0)
Hemoglobin: 11.8 g/dL — ABNORMAL LOW (ref 12.0–15.0)
Immature Granulocytes: 0 %
Lymphocytes Relative: 26 %
Lymphs Abs: 2.1 10*3/uL (ref 0.7–4.0)
MCH: 24.6 pg — ABNORMAL LOW (ref 26.0–34.0)
MCHC: 35.3 g/dL (ref 30.0–36.0)
MCV: 69.6 fL — ABNORMAL LOW (ref 80.0–100.0)
Monocytes Absolute: 0.5 10*3/uL (ref 0.1–1.0)
Monocytes Relative: 6 %
Neutro Abs: 5.3 10*3/uL (ref 1.7–7.7)
Neutrophils Relative %: 64 %
Platelets: 234 10*3/uL (ref 150–400)
RBC: 4.8 MIL/uL (ref 3.87–5.11)
RDW: 15.5 % (ref 11.5–15.5)
WBC: 8.2 10*3/uL (ref 4.0–10.5)
nRBC: 0 % (ref 0.0–0.2)

## 2019-11-16 MED ORDER — SODIUM CHLORIDE 0.9 % IV SOLN
150.0000 mg | Freq: Once | INTRAVENOUS | Status: AC
  Administered 2019-11-16: 150 mg via INTRAVENOUS
  Filled 2019-11-16: qty 150

## 2019-11-16 MED ORDER — PALONOSETRON HCL INJECTION 0.25 MG/5ML
INTRAVENOUS | Status: AC
  Filled 2019-11-16: qty 5

## 2019-11-16 MED ORDER — SODIUM CHLORIDE 0.9 % IV SOLN
600.0000 mg/m2 | Freq: Once | INTRAVENOUS | Status: AC
  Administered 2019-11-16: 1200 mg via INTRAVENOUS
  Filled 2019-11-16: qty 60

## 2019-11-16 MED ORDER — LORAZEPAM 2 MG/ML IJ SOLN
0.5000 mg | Freq: Once | INTRAMUSCULAR | Status: AC
  Administered 2019-11-16: 0.5 mg via INTRAVENOUS

## 2019-11-16 MED ORDER — SODIUM CHLORIDE 0.9 % IV SOLN
Freq: Once | INTRAVENOUS | Status: AC
  Filled 2019-11-16: qty 250

## 2019-11-16 MED ORDER — LORAZEPAM 2 MG/ML IJ SOLN
INTRAMUSCULAR | Status: AC
  Filled 2019-11-16: qty 1

## 2019-11-16 MED ORDER — PALONOSETRON HCL INJECTION 0.25 MG/5ML
0.2500 mg | Freq: Once | INTRAVENOUS | Status: AC
  Administered 2019-11-16: 0.25 mg via INTRAVENOUS

## 2019-11-16 MED ORDER — SODIUM CHLORIDE 0.9% FLUSH
10.0000 mL | INTRAVENOUS | Status: DC | PRN
  Administered 2019-11-16 (×2): 10 mL
  Filled 2019-11-16: qty 10

## 2019-11-16 MED ORDER — HEPARIN SOD (PORK) LOCK FLUSH 100 UNIT/ML IV SOLN
500.0000 [IU] | Freq: Once | INTRAVENOUS | Status: AC | PRN
  Administered 2019-11-16: 500 [IU]
  Filled 2019-11-16: qty 5

## 2019-11-16 MED ORDER — DEXAMETHASONE SODIUM PHOSPHATE 100 MG/10ML IJ SOLN
10.0000 mg | Freq: Once | INTRAMUSCULAR | Status: AC
Start: 2019-11-16 — End: 2019-11-16
  Administered 2019-11-16: 10 mg via INTRAVENOUS
  Filled 2019-11-16: qty 10

## 2019-11-16 MED ORDER — DOXORUBICIN HCL CHEMO IV INJECTION 2 MG/ML
60.0000 mg/m2 | Freq: Once | INTRAVENOUS | Status: AC
  Administered 2019-11-16: 120 mg via INTRAVENOUS
  Filled 2019-11-16: qty 60

## 2019-11-16 NOTE — Telephone Encounter (Signed)
This RN contacted IR at [facility] and at [facility] inquiring about ability to perform biopsy this week of liver. This week requested due to pt is undergoing chemotherapy and next week she would be in her nadir and biopsy not advisable during this time.  Informed by both facilities - no openings available until next week.  This RN informed MD- who stated if no availability this week- to schedule 11/29/2019.  This RN called [employer] and spoke with Aniceto Boss (due to [place] not available ) - informed her of date of need.  She states she is unsure if there are openings on that date " and it would need to be reviewed by the radiologist "  This RN asked if she could put on the order above date due to pt being under chemotherapy and best date related to known nadir issues. Informed her MD is requesting above date and need to inform radiologist for review.  " I will send a note to Morey Hummingbird."  This RN stated appreciation of above request.  This RN also called to IR at [facility] and left message on VM for Tiffany of above need for communication purposes. This RN's direct return call number given.

## 2019-11-16 NOTE — Progress Notes (Signed)
Shortly after beginning pre-medication emend, patient reported shortness of breath, tingling in back, and feeling hot/flushed. Infusion stopped and NS fluids opened to gravity. MD notified and verbal order for 0.5 mg lorazepam ordered. After receiving fluids and medication ordered, patient reported feeling better. Pharmacy informed for future treatment plans.

## 2019-11-16 NOTE — Patient Instructions (Signed)
Doxorubicin injection What is this medicine? DOXORUBICIN (dox oh ROO bi sin) is a chemotherapy drug. It is used to treat many kinds of cancer like leukemia, lymphoma, neuroblastoma, sarcoma, and Wilms' tumor. It is also used to treat bladder cancer, breast cancer, lung cancer, ovarian cancer, stomach cancer, and thyroid cancer. This medicine may be used for other purposes; ask your health care provider or pharmacist if you have questions. COMMON BRAND NAME(S): Adriamycin, Adriamycin PFS, Adriamycin RDF, Rubex What should I tell my health care provider before I take this medicine? They need to know if you have any of these conditions:  heart disease  history of low blood counts caused by a medicine  liver disease  recent or ongoing radiation therapy  an unusual or allergic reaction to doxorubicin, other chemotherapy agents, other medicines, foods, dyes, or preservatives  pregnant or trying to get pregnant  breast-feeding How should I use this medicine? This drug is given as an infusion into a vein. It is administered in a hospital or clinic by a specially trained health care professional. If you have pain, swelling, burning or any unusual feeling around the site of your injection, tell your health care professional right away. Talk to your pediatrician regarding the use of this medicine in children. Special care may be needed. Overdosage: If you think you have taken too much of this medicine contact a poison control center or emergency room at once. NOTE: This medicine is only for you. Do not share this medicine with others. What if I miss a dose? It is important not to miss your dose. Call your doctor or health care professional if you are unable to keep an appointment. What may interact with this medicine? This medicine may interact with the following medications:  6-mercaptopurine  paclitaxel  phenytoin  St. John's Wort  trastuzumab  verapamil This list may not describe  all possible interactions. Give your health care provider a list of all the medicines, herbs, non-prescription drugs, or dietary supplements you use. Also tell them if you smoke, drink alcohol, or use illegal drugs. Some items may interact with your medicine. What should I watch for while using this medicine? This drug may make you feel generally unwell. This is not uncommon, as chemotherapy can affect healthy cells as well as cancer cells. Report any side effects. Continue your course of treatment even though you feel ill unless your doctor tells you to stop. There is a maximum amount of this medicine you should receive throughout your life. The amount depends on the medical condition being treated and your overall health. Your doctor will watch how much of this medicine you receive in your lifetime. Tell your doctor if you have taken this medicine before. You may need blood work done while you are taking this medicine. Your urine may turn red for a few days after your dose. This is not blood. If your urine is dark or brown, call your doctor. In some cases, you may be given additional medicines to help with side effects. Follow all directions for their use. Call your doctor or health care professional for advice if you get a fever, chills or sore throat, or other symptoms of a cold or flu. Do not treat yourself. This drug decreases your body's ability to fight infections. Try to avoid being around people who are sick. This medicine may increase your risk to bruise or bleed. Call your doctor or health care professional if you notice any unusual bleeding. Talk to your doctor   about your risk of cancer. You may be more at risk for certain types of cancers if you take this medicine. Do not become pregnant while taking this medicine or for 6 months after stopping it. Women should inform their doctor if they wish to become pregnant or think they might be pregnant. Men should not father a child while taking this  medicine and for 6 months after stopping it. There is a potential for serious side effects to an unborn child. Talk to your health care professional or pharmacist for more information. Do not breast-feed an infant while taking this medicine. This medicine has caused ovarian failure in some women and reduced sperm counts in some men This medicine may interfere with the ability to have a child. Talk with your doctor or health care professional if you are concerned about your fertility. This medicine may cause a decrease in Co-Enzyme Q-10. You should make sure that you get enough Co-Enzyme Q-10 while you are taking this medicine. Discuss the foods you eat and the vitamins you take with your health care professional. What side effects may I notice from receiving this medicine? Side effects that you should report to your doctor or health care professional as soon as possible:  allergic reactions like skin rash, itching or hives, swelling of the face, lips, or tongue  breathing problems  chest pain  fast or irregular heartbeat  low blood counts - this medicine may decrease the number of white blood cells, red blood cells and platelets. You may be at increased risk for infections and bleeding.  pain, redness, or irritation at site where injected  signs of infection - fever or chills, cough, sore throat, pain or difficulty passing urine  signs of decreased platelets or bleeding - bruising, pinpoint red spots on the skin, black, tarry stools, blood in the urine  swelling of the ankles, feet, hands  tiredness  weakness Side effects that usually do not require medical attention (report to your doctor or health care professional if they continue or are bothersome):  diarrhea  hair loss  mouth sores  nail discoloration or damage  nausea  red colored urine  vomiting This list may not describe all possible side effects. Call your doctor for medical advice about side effects. You may report  side effects to FDA at 1-800-FDA-1088. Where should I keep my medicine? This drug is given in a hospital or clinic and will not be stored at home. NOTE: This sheet is a summary. It may not cover all possible information. If you have questions about this medicine, talk to your doctor, pharmacist, or health care provider.  2020 Elsevier/Gold Standard (2017-01-28 11:01:26) Cyclophosphamide Injection What is this medicine? CYCLOPHOSPHAMIDE (sye kloe FOSS fa mide) is a chemotherapy drug. It slows the growth of cancer cells. This medicine is used to treat many types of cancer like lymphoma, myeloma, leukemia, breast cancer, and ovarian cancer, to name a few. This medicine may be used for other purposes; ask your health care provider or pharmacist if you have questions. COMMON BRAND NAME(S): Cytoxan, Neosar What should I tell my health care provider before I take this medicine? They need to know if you have any of these conditions:  heart disease  history of irregular heartbeat  infection  kidney disease  liver disease  low blood counts, like white cells, platelets, or red blood cells  on hemodialysis  recent or ongoing radiation therapy  scarring or thickening of the lungs  trouble passing urine  an   unusual or allergic reaction to cyclophosphamide, other medicines, foods, dyes, or preservatives  pregnant or trying to get pregnant  breast-feeding How should I use this medicine? This drug is usually given as an injection into a vein or muscle or by infusion into a vein. It is administered in a hospital or clinic by a specially trained health care professional. Talk to your pediatrician regarding the use of this medicine in children. Special care may be needed. Overdosage: If you think you have taken too much of this medicine contact a poison control center or emergency room at once. NOTE: This medicine is only for you. Do not share this medicine with others. What if I miss a  dose? It is important not to miss your dose. Call your doctor or health care professional if you are unable to keep an appointment. What may interact with this medicine?  amphotericin B  azathioprine  certain antivirals for HIV or hepatitis  certain medicines for blood pressure, heart disease, irregular heart beat  certain medicines that treat or prevent blood clots like warfarin  certain other medicines for cancer  cyclosporine  etanercept  indomethacin  medicines that relax muscles for surgery  medicines to increase blood counts  metronidazole This list may not describe all possible interactions. Give your health care provider a list of all the medicines, herbs, non-prescription drugs, or dietary supplements you use. Also tell them if you smoke, drink alcohol, or use illegal drugs. Some items may interact with your medicine. What should I watch for while using this medicine? Your condition will be monitored carefully while you are receiving this medicine. You may need blood work done while you are taking this medicine. Drink water or other fluids as directed. Urinate often, even at night. Some products may contain alcohol. Ask your health care professional if this medicine contains alcohol. Be sure to tell all health care professionals you are taking this medicine. Certain medicines, like metronidazole and disulfiram, can cause an unpleasant reaction when taken with alcohol. The reaction includes flushing, headache, nausea, vomiting, sweating, and increased thirst. The reaction can last from 30 minutes to several hours. Do not become pregnant while taking this medicine or for 1 year after stopping it. Women should inform their health care professional if they wish to become pregnant or think they might be pregnant. Men should not father a child while taking this medicine and for 4 months after stopping it. There is potential for serious side effects to an unborn child. Talk to your  health care professional for more information. Do not breast-feed an infant while taking this medicine or for 1 week after stopping it. This medicine has caused ovarian failure in some women. This medicine may make it more difficult to get pregnant. Talk to your health care professional if you are concerned about your fertility. This medicine has caused decreased sperm counts in some men. This may make it more difficult to father a child. Talk to your health care professional if you are concerned about your fertility. Call your health care professional for advice if you get a fever, chills, or sore throat, or other symptoms of a cold or flu. Do not treat yourself. This medicine decreases your body's ability to fight infections. Try to avoid being around people who are sick. Avoid taking medicines that contain aspirin, acetaminophen, ibuprofen, naproxen, or ketoprofen unless instructed by your health care professional. These medicines may hide a fever. Talk to your health care professional about your risk of cancer.   You may be more at risk for certain types of cancer if you take this medicine. If you are going to need surgery or other procedure, tell your health care professional that you are using this medicine. Be careful brushing or flossing your teeth or using a toothpick because you may get an infection or bleed more easily. If you have any dental work done, tell your dentist you are receiving this medicine. What side effects may I notice from receiving this medicine? Side effects that you should report to your doctor or health care professional as soon as possible:  allergic reactions like skin rash, itching or hives, swelling of the face, lips, or tongue  breathing problems  nausea, vomiting  signs and symptoms of bleeding such as bloody or black, tarry stools; red or dark brown urine; spitting up blood or brown material that looks like coffee grounds; red spots on the skin; unusual bruising  or bleeding from the eyes, gums, or nose  signs and symptoms of heart failure like fast, irregular heartbeat, sudden weight gain; swelling of the ankles, feet, hands  signs and symptoms of infection like fever; chills; cough; sore throat; pain or trouble passing urine  signs and symptoms of kidney injury like trouble passing urine or change in the amount of urine  signs and symptoms of liver injury like dark yellow or brown urine; general ill feeling or flu-like symptoms; light-colored stools; loss of appetite; nausea; right upper belly pain; unusually weak or tired; yellowing of the eyes or skin Side effects that usually do not require medical attention (report to your doctor or health care professional if they continue or are bothersome):  confusion  decreased hearing  diarrhea  facial flushing  hair loss  headache  loss of appetite  missed menstrual periods  signs and symptoms of low red blood cells or anemia such as unusually weak or tired; feeling faint or lightheaded; falls  skin discoloration This list may not describe all possible side effects. Call your doctor for medical advice about side effects. You may report side effects to FDA at 1-800-FDA-1088. Where should I keep my medicine? This drug is given in a hospital or clinic and will not be stored at home. NOTE: This sheet is a summary. It may not cover all possible information. If you have questions about this medicine, talk to your doctor, pharmacist, or health care provider.  2020 Elsevier/Gold Standard (2019-03-21 09:53:29)  

## 2019-11-16 NOTE — Telephone Encounter (Signed)
LVM that her genetic test results are available and requested that she call back to discuss them.  

## 2019-11-17 ENCOUNTER — Encounter (HOSPITAL_COMMUNITY): Payer: Self-pay

## 2019-11-17 ENCOUNTER — Telehealth: Payer: Self-pay | Admitting: Adult Health

## 2019-11-17 LAB — CANCER ANTIGEN 27.29: CA 27.29: 29.8 U/mL (ref 0.0–38.6)

## 2019-11-17 NOTE — Progress Notes (Signed)
Krista [name]. [name] Female, 44 y.o., 01/19/1976 MRN:  [id number] Phone:  [phone] [name]) PCP:  Patient, No Pcp Per Coverage:  [facility] [employer] [place] Next Appt With Oncology 11/18/2019 at 12:45 PM  RE: Biopsy Received: Yesterday Message Contents  [name], MD  [name]   [place] core L liver lesion ?met   DDH   Previous Messages  ----- Message -----  From: [name]  Sent: 11/16/2019 11:34 AM EDT  To: Ir Procedure Requests  Subject: Biopsy                      Procedure Requested: US Biopsy (Liver)    Reason for Procedure: evaluate lesion seen on MRI    Provider Requesting: Chauncey Cruel  Provider Telephone: [phone]    Other Info: Rad exam in Epic

## 2019-11-17 NOTE — Telephone Encounter (Signed)
No 5/19 LOS. No changes were made to pt's schedule.

## 2019-11-18 ENCOUNTER — Other Ambulatory Visit: Payer: Self-pay

## 2019-11-18 ENCOUNTER — Inpatient Hospital Stay: Payer: BC Managed Care – PPO

## 2019-11-18 ENCOUNTER — Encounter (HOSPITAL_COMMUNITY): Payer: Self-pay

## 2019-11-18 VITALS — BP 154/85 | HR 89 | Temp 98.8°F | Resp 16

## 2019-11-18 DIAGNOSIS — C50211 Malignant neoplasm of upper-inner quadrant of right female breast: Secondary | ICD-10-CM | POA: Diagnosis not present

## 2019-11-18 DIAGNOSIS — Z171 Estrogen receptor negative status [ER-]: Secondary | ICD-10-CM

## 2019-11-18 MED ORDER — PEGFILGRASTIM-JMDB 6 MG/0.6ML ~~LOC~~ SOSY
6.0000 mg | PREFILLED_SYRINGE | Freq: Once | SUBCUTANEOUS | Status: AC
  Administered 2019-11-18: 6 mg via SUBCUTANEOUS

## 2019-11-18 MED ORDER — PEGFILGRASTIM-JMDB 6 MG/0.6ML ~~LOC~~ SOSY
PREFILLED_SYRINGE | SUBCUTANEOUS | Status: AC
  Filled 2019-11-18: qty 0.6

## 2019-11-18 NOTE — Patient Instructions (Signed)

## 2019-11-18 NOTE — Progress Notes (Unsigned)
Krista [name]. Krista [name] Female, 44 y.o., December 22, 1975 MRN:  [id number] Phone:  [phone] [name]) PCP:  Patient, No Pcp Per Coverage:  [place] Next Appt With Radiology ([facility]) 11/23/2019 at 1:00 PM  RE: Biopsy Received: 2 days ago [place], MD  [name]   [place] core L liver lesion ?met   DDH   Previous Messages  ----- Message -----  From: [name]  Sent: 11/16/2019 11:34 AM EDT  To: Ir Procedure Requests  Subject: Biopsy                      Procedure Requested: US Biopsy (Liver)    Reason for Procedure: evaluate lesion seen on MRI    Provider Requesting: Chauncey Cruel  Provider Telephone: [phone]    Other Info: Rad exam in Epic

## 2019-11-21 ENCOUNTER — Encounter: Payer: Self-pay | Admitting: *Deleted

## 2019-11-21 NOTE — Progress Notes (Signed)
[place]  Telephone:(336) [phone] Fax:(336) [phone]     ID: Krista [name] DOB: [birth year]  MR#: [id number]  FMB#:[id number]  Patient Care Team: Patient, No Pcp Per as PCP - General (General Practice) Carole Doner, [name], MD as Consulting Physician (Oncology) Erroll Luna, MD as Consulting Physician (General Surgery) Eppie Gibson, MD as Attending Physician (Radiation Oncology) Rockwell Germany, RN as Oncology Nurse Navigator Mauro Kaufmann, RN as Oncology Nurse Navigator Chauncey Cruel, MD OTHER MD:  CHIEF COMPLAINT: Triple negative breast cancer  CURRENT TREATMENT: Neoadjuvant chemotherapy   INTERVAL HISTORY: Krista [name] returns today for follow up and treatment of her triple negative breast cancer accompanied by her husband Krista [name].    She started on neoadjuvant chemotherapy, consisting of doxorubicin and cyclophosphamide, on 11/16/2019. Today is day 7 cycle 1.  She tolerated this generally quite well.  She found the first day rough because she had a reaction to one of the antinausea medicines and this has been eliminated.  She slept through most of the chemo secondary to Ativan and since she will not be driving home I were going to add that to all her [facility] treatments.  She is scheduled for liver biopsy tomorrow, 11/23/2019.   REVIEW OF SYSTEMS: Krista [name] generally did well.  She had minimal nausea.  She did take her nausea medicines exactly as prescribed and she actually continued them to today.  She had a little bit of vision change but no double vision no presyncope and no falls.  She is doing some walking and some yoga most days.  She had minimal bone pain from the Neulasta.  Overall a detailed review of systems today was benign   HISTORY OF CURRENT ILLNESS: From the original intake note:  Krista [name] [name]"] herself palpated a lump in the upper-inner right breast sometime late December 2020 or early January 2021.  As the  mass grew larger and persisted through menstrual periods she brought it to medical attention and underwent bilateral diagnostic mammography with tomography and bilateral breast ultrasonography at [facility] on 09/26/2019 showing: breast density category C; 5.1 cm mass involving upper-inner quadrant of right breast; no evidence of right axillary lymphadenopathy or left breast malignancy.  Accordingly on 10/03/2019 she proceeded to biopsy of the right breast area in question. The pathology from this procedure ([id number]) showed: invasive ductal carcinoma, grade 3. Prognostic indicators significant for: estrogen receptor, 0% negative and progesterone receptor, 0% negative. Proliferation marker Ki67 at 80%. HER2 negative by immunohistochemistry (1+).  The patient's subsequent history is as detailed below.   PAST MEDICAL HISTORY: Past Medical History:  Diagnosis Date  . Anxiety   . Cancer ([place])   . Family history of breast cancer   . Family history of cervical cancer   . Family history of lung cancer   . Hemorrhoids     PAST SURGICAL HISTORY: No prior surgeries   FAMILY HISTORY: Family History  Problem Relation Age of Onset  . Breast cancer Mother 53       double mastectomy  . Breast cancer Half-Sister 32  . Cirrhosis Half-Sister   . Heart Problems Father   . Stroke Father   . Breast cancer Maternal Aunt 52  . Cancer Maternal Uncle 59       unknown type  . Cervical cancer Maternal Grandmother        dx. in her mid-40s  . Lung cancer Paternal Grandmother   . Cancer Other  unknown type; maternal great-uncles/aunts (x3)  . Diabetes Half-Brother   . Cancer Other        unknown type; matenral great-uncles/aunt (x4)  . Cancer Maternal Great-grandfather        unknown type  . Breast cancer Cousin 70       paternal cousin  The patient's father died at age 52 from cardiac complications of drug use.  His mother, the patient's paternal grandmother had what seems to have been  cancer of the lung metastatic to the spine.  There was no other cancer on his side of the family to the patient's knowledge.  The patient's mother died at age 52 in the setting of multiple medical issues but she had undergone double mastectomy at the age of 51.  Her mother, the patient's maternal grandmother had cervical cancer.  The patient's mother had 3 brothers and 3 sisters.  1 of those 3 sisters had breast cancer diagnosed at the age of 58.  The patient herself has 2 brothers and 3 sisters one of her sisters was diagnosed with breast cancer at the age of 52 and died at the age of 57   GYNECOLOGIC HISTORY:  No LMP recorded. Menarche: 44 years old Age at first live birth: 44 years old [place] P 3 LMP regular, last approximately 5 days, of which the second day is heavy Contraceptive: Husband status post vasectomy HRT no  Hysterectomy? no BSO?  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  Her husband Krista [name] is a Research scientist (medical) and also Librarian, academic at [employer].  Their children are Krista [name], 20, who is Scientist, product/process development at [employer], 18, who will study psychology at [employer] starting the fall 2021; and Krista [name], 40, currently attending [employer].  The patient is not a church attender    ADVANCED DIRECTIVES: In the absence of any documents to the contrary the patient's husband is her healthcare power of attorney   HEALTH MAINTENANCE: Social History   Tobacco Use  . Smoking status: Never Smoker  . Smokeless tobacco: Never Used  Substance Use Topics  . Alcohol use: Not Currently    Comment: Social drinker  . Drug use: Never     Colonoscopy: n/a (age)  PAP: UTD  Bone density: n/a (age)   Allergies  Allergen Reactions  . Emend [Fosaprepitant] Shortness Of Breath    Current Outpatient Medications  Medication Sig Dispense Refill  . acetaminophen (TYLENOL) 500 MG tablet Take 1-2 tablets (500-1,000 mg total) by  mouth every 6 (six) hours as needed. (Patient taking differently: Take 500-1,000 mg by mouth every 6 (six) hours as needed for moderate pain or fever. ) 60 tablet 0  . desloratadine (CLARINEX) 5 MG tablet Take 5 mg by mouth daily.    Marland Kitchen dexamethasone (DECADRON) 4 MG tablet Take 2 tablets by mouth daily starting the day after Cytoxan x 3 days. Take with food. (Patient taking differently: Take 8 mg by mouth daily. ) 30 tablet 1  . lidocaine-prilocaine (EMLA) cream Apply to affected area once 30 g 3  . loratadine (CLARITIN) 10 MG tablet Take 1 tablet (10 mg total) by mouth daily. 60 tablet 0  . LORazepam (ATIVAN) 0.5 MG tablet Take 1 tablet (0.5 mg total) by mouth at bedtime as needed (Nausea or vomiting). 30 tablet 0  . Multiple Vitamins-Minerals (MULTIVITAMIN WITH MINERALS) tablet Take 2 tablets by mouth daily.    . prochlorperazine (  COMPAZINE) 10 MG tablet Take 1 tablet (10 mg total) by mouth every 6 (six) hours as needed (Nausea or vomiting). 30 tablet 1   No current facility-administered medications for this visit.    OBJECTIVE: African-American woman in no acute distress  There were no vitals filed for this visit.   There is no height or weight on file to calculate BMI.   Wt Readings from Last 3 Encounters:  11/16/19 208 lb 14.4 oz (94.8 kg)  11/07/19 209 lb 3.2 oz (94.9 kg)  10/17/19 211 lb 4.8 oz (95.8 kg)      ECOG FS:1 - Symptomatic but completely ambulatory  Sclerae unicteric, EOMs intact Wearing a mask No cervical or supraclavicular adenopathy Lungs no rales or rhonchi Heart regular rate and rhythm Abd soft, nontender, positive bowel sounds MSK no focal spinal tenderness, no upper extremity lymphedema Neuro: nonfocal, well oriented, appropriate affect Breasts: The mass in the upper inner aspect/superior aspect of the right breast is a palpable and movable, without erythema or nipple retraction.  Left breast is benign.  Both axillae are benign.   LAB RESULTS:  CMP       Component Value Date/Time   NA 138 11/22/2019 1234   K 3.8 11/22/2019 1234   CL 105 11/22/2019 1234   CO2 25 11/22/2019 1234   GLUCOSE 173 (H) 11/22/2019 1234   BUN 13 11/22/2019 1234   CREATININE 0.78 11/22/2019 1234   CREATININE 0.95 10/17/2019 1529   CALCIUM 8.7 (L) 11/22/2019 1234   PROT 7.3 11/22/2019 1234   ALBUMIN 3.8 11/22/2019 1234   AST 12 (L) 11/22/2019 1234   AST 16 10/17/2019 1529   ALT 16 11/22/2019 1234   ALT 13 10/17/2019 1529   ALKPHOS 109 11/22/2019 1234   BILITOT 0.3 11/22/2019 1234   BILITOT 1.1 10/17/2019 1529   GFRNONAA >60 11/22/2019 1234   GFRNONAA >60 10/17/2019 1529   GFRAA >60 11/22/2019 1234   GFRAA >60 10/17/2019 1529    No results found for: TOTALPROTELP, ALBUMINELP, A1GS, A2GS, BETS, BETA2SER, GAMS, MSPIKE, SPEI  Lab Results  Component Value Date   WBC 23.9 (H) 11/22/2019   NEUTROABS 23.2 (H) 11/22/2019   HGB 11.1 (L) 11/22/2019   HCT 31.6 (L) 11/22/2019   MCV 71.2 (L) 11/22/2019   PLT 270 11/22/2019    No results found for: LABCA2  No components found for: TDSKAJ681  No results for input(s): INR in the last 168 hours.  No results found for: LABCA2  No results found for: CAN199  No results found for: LXB262  No results found for: MBT597  Lab Results  Component Value Date   CA2729 29.8 11/16/2019    No components found for: HGQUANT  No results found for: CEA1 / No results found for: CEA1   No results found for: AFPTUMOR  No results found for: CHROMOGRNA  No results found for: KPAFRELGTCHN, LAMBDASER, KAPLAMBRATIO (kappa/lambda light chains)  No results found for: HGBA, HGBA2QUANT, HGBFQUANT, HGBSQUAN (Hemoglobinopathy evaluation)   No results found for: LDH  Lab Results  Component Value Date   IRON 99 10/17/2019   TIBC 391 10/17/2019   IRONPCTSAT 25 10/17/2019   (Iron and TIBC)  Lab Results  Component Value Date   FERRITIN 45 10/17/2019    Urinalysis No results found for: COLORURINE, APPEARANCEUR,  LABSPEC, PHURINE, GLUCOSEU, HGBUR, BILIRUBINUR, KETONESUR, PROTEINUR, UROBILINOGEN, NITRITE, LEUKOCYTESUR   STUDIES: CT Chest W Contrast  Result Date: 10/31/2019 CLINICAL DATA:  Breast cancer staging, diagnosed 3 weeks ago. EXAM: CT  CHEST, ABDOMEN, AND PELVIS WITH CONTRAST TECHNIQUE: Multidetector CT imaging of the chest, abdomen and pelvis was performed following the standard protocol during bolus administration of intravenous contrast. CONTRAST:  1107m OMNIPAQUE IOHEXOL 300 MG/ML  SOLN COMPARISON:  None. FINDINGS: CT CHEST FINDINGS Cardiovascular: Heart is at the upper limits of normal in size to mildly enlarged. No pericardial effusion. Mediastinum/Nodes: Heterogeneous lymph node in the right axilla measures 1.5 cm (2/19). No additional pathologically enlarged axillary, internal mammary, mediastinal or hilar lymph nodes. Esophagus is unremarkable. Lungs/Pleura: 2 mm subpleural posterior right upper lobe nodule (7/52). 1 mm inferior left lower lobe nodule (7/141). No pleural fluid. Airway is unremarkable. Musculoskeletal: Heterogeneous lobulated mass with surrounding nodularity in the right breast measures at least 3.4 x 4.9 cm (2/13). There is a soft tissue nodule in the lateral left breast measuring 1.7 x 2.0 cm (2/22). No worrisome lytic or sclerotic lesions. CT ABDOMEN PELVIS FINDINGS Hepatobiliary: Ill-defined hypodense lesion in segment 4 of the liver measures 1.5 cm (2/59). Liver and gallbladder are otherwise unremarkable. No biliary ductal dilatation. Pancreas: Negative. Spleen: Negative. Adrenals/Urinary Tract: Adrenal glands and kidneys are unremarkable. Mild dilatation of the ureters bilaterally, left greater than right, possibly due to an enlarged uterus. 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 Bladder is grossly unremarkable. Stomach/Bowel: Stomach, small bowel, appendix and colon are unremarkable. Fair amount of stool in the colon is  indicative of constipation. Vascular/Lymphatic: Vascular structures are unremarkable. No pathologically enlarged lymph nodes. Reproductive: Uterus is enlarged and heterogeneous, with a dominant mass measuring approximately 8.6 cm (6/104). Prominent nabothian cysts. No adnexal mass. Other: No free fluid.  Mesenteries and peritoneum are unremarkable. Musculoskeletal: No worrisome lytic or sclerotic lesions. IMPRESSION: 1. Large right breast mass with metastatic right axillary adenopathy. 2. Ill-defined low-attenuation lesion in segment 4 of the liver is worrisome for metastatic disease. Further evaluation with MR abdomen without and with contrast could be performed, as clinically indicated. 3. Lateral left breast nodule. Patient recently underwent sonographic and MRI evaluation on 09/06/2019 and 10/27/2019, respectively. 4. Enlarged, heterogeneous and fibroid uterus which may cause mild bilateral ureteral dilatation, left greater than right. 5. Tiny subpleural pulmonary nodules are nonspecific and not felt to be metastatic. Continued attention on follow-up exams is suggested. Electronically Signed   By: MLorin PicketM.D.   On: 10/31/2019 11:45   NM Bone Scan Whole Body  Result Date: 10/31/2019 CLINICAL DATA:  RIGHT breast cancer, staging EXAM: NUCLEAR MEDICINE WHOLE BODY BONE SCAN TECHNIQUE: Whole body anterior and posterior images were obtained approximately 3 hours after intravenous injection of radiopharmaceutical. RADIOPHARMACEUTICALS:  21 mCi Technetium-953mDP IV COMPARISON:  None Correlation: CT chest abdomen pelvis 10/31/2019 FINDINGS: Symmetric calvarial uptake, nonspecific. Uptake at the shoulders, sternoclavicular joints, hips, and knees, typically degenerative. No definite sites of abnormal osseous tracer uptake are seen to suggest osseous metastatic disease. Small amount retained tracer within LEFT renal collecting system, corresponding to mildly dilated duplicated collecting system/proximal ureters  seen on CT. Remaining urinary tract and soft tissue distribution of tracer unremarkable. IMPRESSION: No scintigraphic evidence of osseous metastatic disease. Electronically Signed   By: MaLavonia Dana.D.   On: 10/31/2019 16:15   CT Abdomen Pelvis W Contrast  Result Date: 10/31/2019 CLINICAL DATA:  Breast cancer staging, diagnosed 3 weeks ago. EXAM: CT CHEST, ABDOMEN, AND PELVIS WITH CONTRAST TECHNIQUE: Multidetector CT imaging of the chest, abdomen and pelvis was performed following the standard protocol during bolus administration of intravenous contrast.  CONTRAST:  147m OMNIPAQUE IOHEXOL 300 MG/ML  SOLN COMPARISON:  None. FINDINGS: CT CHEST FINDINGS Cardiovascular: Heart is at the upper limits of normal in size to mildly enlarged. No pericardial effusion. Mediastinum/Nodes: Heterogeneous lymph node in the right axilla measures 1.5 cm (2/19). No additional pathologically enlarged axillary, internal mammary, mediastinal or hilar lymph nodes. Esophagus is unremarkable. Lungs/Pleura: 2 mm subpleural posterior right upper lobe nodule (7/52). 1 mm inferior left lower lobe nodule (7/141). No pleural fluid. Airway is unremarkable. Musculoskeletal: Heterogeneous lobulated mass with surrounding nodularity in the right breast measures at least 3.4 x 4.9 cm (2/13). There is a soft tissue nodule in the lateral left breast measuring 1.7 x 2.0 cm (2/22). No worrisome lytic or sclerotic lesions. CT ABDOMEN PELVIS FINDINGS Hepatobiliary: Ill-defined hypodense lesion in segment 4 of the liver measures 1.5 cm (2/59). Liver and gallbladder are otherwise unremarkable. No biliary ductal dilatation. Pancreas: Negative. Spleen: Negative. Adrenals/Urinary Tract: Adrenal glands and kidneys are unremarkable. Mild dilatation of the ureters bilaterally, left greater than right, possibly due to an enlarged uterus. 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 Bladder is grossly unremarkable.  Stomach/Bowel: Stomach, small bowel, appendix and colon are unremarkable. Fair amount of stool in the colon is indicative of constipation. Vascular/Lymphatic: Vascular structures are unremarkable. No pathologically enlarged lymph nodes. Reproductive: Uterus is enlarged and heterogeneous, with a dominant mass measuring approximately 8.6 cm (6/104). Prominent nabothian cysts. No adnexal mass. Other: No free fluid.  Mesenteries and peritoneum are unremarkable. Musculoskeletal: No worrisome lytic or sclerotic lesions. IMPRESSION: 1. Large right breast mass with metastatic right axillary adenopathy. 2. Ill-defined low-attenuation lesion in segment 4 of the liver is worrisome for metastatic disease. Further evaluation with MR abdomen without and with contrast could be performed, as clinically indicated. 3. Lateral left breast nodule. Patient recently underwent sonographic and MRI evaluation on 09/06/2019 and 10/27/2019, respectively. 4. Enlarged, heterogeneous and fibroid uterus which may cause mild bilateral ureteral dilatation, left greater than right. 5. Tiny subpleural pulmonary nodules are nonspecific and not felt to be metastatic. Continued attention on follow-up exams is suggested. Electronically Signed   By: MLorin PicketM.D.   On: 10/31/2019 11:45   MR LIVER W WO CONTRAST  Result Date: 11/12/2019 CLINICAL DATA:  Breast cancer staging. EXAM: MRI ABDOMEN WITHOUT AND WITH CONTRAST TECHNIQUE: Multiplanar multisequence MR imaging of the abdomen was performed both before and after the administration of intravenous contrast. CONTRAST:  186mGADAVIST GADOBUTROL 1 MMOL/ML IV SOLN COMPARISON:  Chest abdomen and pelvis 10/31/2019 FINDINGS: Lower chest: Incidental imaging of the lung bases is unremarkable, limited assessment on MRI. Hepatobiliary: Targetoid lesion in the medial segment of the LEFT hepatic lobe (image 17, series 5 and image 58 of series 8 displays restricted diffusion. Lesion displays peripheral  enhancement, central necrosis and peripheral washout. Additional tiny focus of diffusion related signal, see below. Pancreas: Pancreas without sign of inflammation, focal lesion or ductal dilation. Spleen:  Spleen normal size without focal lesion. Adrenals/Urinary Tract: Adrenal glands are normal. Smooth renal contours. Stomach/Bowel: Stomach is distended with ingested contents. No acute gastrointestinal process to the extent evaluated, study not protocol for bowel evaluation. Normal appendix. Vascular/Lymphatic: Vascular structures in the abdomen are patent. No adenopathy. 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 No ascites. Small fat containing umbilical hernia. Musculoskeletal: No acute musculoskeletal process. IMPRESSION: 1. Targetoid lesion in the medial segment of the LEFT hepatic lobe displays restricted diffusion with target like enhancement with  features of metastatic disease. Tissue sampling may be helpful. 2. Subtle focus of diffusion related signal along a venous branch in the lateral segment LEFT hepatic lobe just above the inter lobar fissure (series 8, image 11). No corresponding abnormality on contrasted imaging. This could represent slow flow in a venous branch in plane with the scan or a small focus of disease. Close attention on follow-up is suggested. 3. No other evidence of metastatic disease within the abdomen. 4. 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 Electronically Signed   By: Zetta Bills M.D.   On: 11/12/2019 15:31   MR BREAST BILATERAL W WO CONTRAST INC CAD  Result Date: 10/27/2019 CLINICAL DATA:  44 year old female with recently diagnosed grade 3 invasive ductal carcinoma of the right breast post ultrasound-guided core biopsy of a 5.1 cm palpable mass at the 1 o'clock position. The patient does have a very strong family history of breast cancer, including in her mother, sister as well as a maternal aunt all diagnosed before age 5. LABS:   Not applicable. EXAM: BILATERAL BREAST MRI WITH AND WITHOUT CONTRAST TECHNIQUE: Multiplanar, multisequence MR images of both breasts were obtained prior to and following the intravenous administration of 9 ml of Gadavist Three-dimensional MR images were rendered by post-processing of the original MR data on an independent workstation. The three-dimensional MR images were interpreted, and findings are reported in the following complete MRI report for this study. Three dimensional images were evaluated at the independent DynaCad workstation COMPARISON:  Prior mammograms and ultrasounds dated 09/26/2019 and 10/03/2019. FINDINGS: Breast composition: c.  Heterogeneous fibroglandular tissue. Background parenchymal enhancement: Mild. Right breast: Heterogeneously enhancing partially necrotic mass at site of biopsy proven malignancy in the upper inner right breast measures up to 7.9 cm AP, 4.6 cm transverse, and 4.6 cm craniocaudal. Several small satellite masses are present along the inferior margin of the dominant mass (subtraction images 68-70. In addition, there is linear oriented non mass enhancement (subtraction image 79) in the lower inner right breast measuring 3.5 cm AP. Left breast: No mass or abnormal enhancement. Lymph nodes: There is a morphologically abnormal level I in the right axilla (series 3, image 46) measuring up to 2.2 cm. Ancillary findings:  None. IMPRESSION: 1. Biopsy proven malignancy in the upper inner right breast with small adjacent satellite masses along the inferior margin measures up to 7.9 x 4.6 x 4.6 cm. 2. Suspicious linear oriented enhancement in the lower inner quadrant of the right breast slightly inferior to the dominant mass measures up to 3.5 cm. 3.  Morphologically abnormal level I lymph node in the right axilla. 4.  No MRI evidence of malignancy in the left breast. RECOMMENDATION: 1. Recommend the patient return for second-look ultrasound and biopsy of the morphologically abnormal  lymph node in the right axilla. 2. If breast conservation is a consideration, then recommend the patient return for MRI guided biopsy of the linear non mass enhancement in the lower inner quadrant of the right breast (subtraction image 79). BI-RADS CATEGORY  4: Suspicious. Electronically Signed   By: Everlean Alstrom M.D.   On: 10/27/2019 15:51   ECHOCARDIOGRAM COMPLETE  Result Date: 10/24/2019    ECHOCARDIOGRAM REPORT   Patient Name:   Krista [name] Date of Exam: 10/24/2019 Medical Rec #:  [id number]                  Height:       67.0 in Accession #:    [id number]  Weight:       211.0 lb Date of Birth:  18-May-1976                  BSA:          2.069 m Patient Age:    70 years                   BP:           154/108 mmHg Patient Gender: F                          HR:           79 bpm. Exam Location:  Outpatient Procedure: 2D Echo Indications:    Chemo V67.2 / Z09  History:        Patient has no prior history of Echocardiogram examinations.                 Breast cancer.  Sonographer:    Vikki Ports Turrentine Referring Phys: [place]  1. Left ventricular ejection fraction, by estimation, is 60 to 65%. The left ventricle has normal function. The left ventricle has no regional wall motion abnormalities. Left ventricular diastolic parameters are consistent with Grade I diastolic dysfunction (impaired relaxation). The average left ventricular global longitudinal strain is -21.6 %. The global longitudinal strain is normal.  2. Lateral annulus velocity: 16.4 cm/s. Right ventricular systolic function is normal. The right ventricular size is normal.  3. The mitral valve is normal in structure. Trivial mitral valve regurgitation. No evidence of mitral stenosis.  4. The aortic valve is normal in structure. Aortic valve regurgitation is not visualized. No aortic stenosis is present.  5. The inferior vena cava is normal in size with greater than 50% respiratory variability,  suggesting right atrial pressure of 3 mmHg. FINDINGS  Left Ventricle: Left ventricular ejection fraction, by estimation, is 60 to 65%. The left ventricle has normal function. The left ventricle has no regional wall motion abnormalities. The average left ventricular global longitudinal strain is -21.6 %. The global longitudinal strain is normal. The left ventricular internal cavity size was normal in size. There is no left ventricular hypertrophy. Left ventricular diastolic parameters are consistent with Grade I diastolic dysfunction (impaired relaxation). Right Ventricle: Lateral annulus velocity: 16.4 cm/s. The right ventricular size is normal. No increase in right ventricular wall thickness. Right ventricular systolic function is normal. Left Atrium: Left atrial size was normal in size. Right Atrium: Right atrial size was normal in size. Pericardium: There is no evidence of pericardial effusion. Mitral Valve: The mitral valve is normal in structure. Normal mobility of the mitral valve leaflets. Trivial mitral valve regurgitation. No evidence of mitral valve stenosis. Tricuspid Valve: The tricuspid valve is normal in structure. Tricuspid valve regurgitation is trivial. No evidence of tricuspid stenosis. Aortic Valve: The aortic valve is normal in structure. Aortic valve regurgitation is not visualized. No aortic stenosis is present. Aortic valve mean gradient measures 5.0 mmHg. Aortic valve peak gradient measures 9.5 mmHg. Aortic valve area, by VTI measures 2.78 cm. Pulmonic Valve: The pulmonic valve was normal in structure. Pulmonic valve regurgitation is not visualized. No evidence of pulmonic stenosis. Aorta: The aortic root is normal in size and structure. Venous: The inferior vena cava is normal in size with greater than 50% respiratory variability, suggesting right atrial pressure of 3 mmHg. IAS/Shunts: No atrial level shunt detected by color flow Doppler.  LEFT  VENTRICLE PLAX 2D LVIDd:         3.90 cm   Diastology LVIDs:         2.40 cm  LV e' lateral:   6.96 cm/s LV PW:         1.10 cm  LV E/e' lateral: 14.8 LV IVS:        1.10 cm  LV e' medial:    5.87 cm/s LVOT diam:     2.00 cm  LV E/e' medial:  17.5 LV SV:         78 LV SV Index:   38       2D Longitudinal Strain LVOT Area:     3.14 cm 2D Strain GLS Avg:     -21.6 %  RIGHT VENTRICLE RV S prime:     16.40 cm/s TAPSE (M-mode): 2.1 cm LEFT ATRIUM             Index       RIGHT ATRIUM           Index LA diam:        4.20 cm 2.03 cm/m  RA Area:     17.40 cm LA Vol (A2C):   65.5 ml 31.66 ml/m RA Volume:   42.70 ml  20.64 ml/m LA Vol (A4C):   61.9 ml 29.92 ml/m LA Biplane Vol: 64.6 ml 31.23 ml/m  AORTIC VALVE AV Area (Vmax):    2.35 cm AV Area (Vmean):   2.52 cm AV Area (VTI):     2.78 cm AV Vmax:           154.00 cm/s AV Vmean:          110.000 cm/s AV VTI:            0.279 m AV Peak Grad:      9.5 mmHg AV Mean Grad:      5.0 mmHg LVOT Vmax:         115.00 cm/s LVOT Vmean:        88.300 cm/s LVOT VTI:          0.247 m LVOT/AV VTI ratio: 0.89  AORTA Ao Root diam: 3.20 cm MITRAL VALVE MV Area (PHT): 3.50 cm     SHUNTS MV Decel Time: 217 msec     Systemic VTI:  0.25 m MV E velocity: 103.00 cm/s  Systemic Diam: 2.00 cm MV A velocity: 109.00 cm/s MV E/A ratio:  0.94 Krista Furbish MD Electronically signed by Krista Furbish MD Signature Date/Time: 10/24/2019/12:16:00 PM    Final    [place] AXILLARY NODE CORE BIOPSY RIGHT  Addendum Date: 11/21/2019   ADDENDUM REPORT: 11/10/2019 14:43 ADDENDUM: 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 This was found to be concordant by Dr. Kristopher Oppenheim. Pathology results were discussed with the patient by telephone. The patient reported doing well after the biopsy with tenderness at the site. Post biopsy instructions and care were reviewed and questions were answered. The patient was encouraged to call [facility] for any additional concerns. The patient has a recent diagnosis  of Right breast cancer and should follow her outlined treatment plan. Bary Castilla, RN Nurse Navigator, with [facility] was notified of biopsy results via EPIC message on Nov 10, 2019. Pathology results reported by Krista Purser, RN on 11/10/2019. Electronically Signed   By: Kristopher Oppenheim M.D.   On: 11/10/2019 14:43   Result Date: 11/21/2019 CLINICAL DATA:  44 year old female with recently  diagnosed right breast cancer presents for biopsy of an enlarged axillary lymph node. EXAM: [place] AXILLARY NODE CORE BIOPSY RIGHT COMPARISON:  Previous exam(s). PROCEDURE: I met with the patient and we discussed the procedure of ultrasound-guided biopsy, including benefits and alternatives. We discussed the high likelihood of a successful procedure. We discussed the risks of the procedure, including infection, bleeding, tissue injury, clip migration, and inadequate sampling. Informed written consent was given. The usual time-out protocol was performed immediately prior to the procedure. Using sterile technique and 1% Lidocaine as local anesthetic, under direct ultrasound visualization, a 14 gauge spring-loaded device was used to perform biopsy of a low lying right axillary lymph node using a lateral approach. At the conclusion of the procedure a post biopsy tissue marker clip was deployed into the biopsy cavity. Follow up 2 view mammogram was deferred. IMPRESSION: Ultrasound guided biopsy of a right axillary lymph node. No apparent complications. Electronically Signed: By: Kristopher Oppenheim M.D. On: 11/09/2019 15:43   [place] AXILLA RIGHT  Result Date: 11/09/2019 CLINICAL DATA:  Patient presents for second-look evaluation of a prominent right axillary lymph node seen on recent staging MRI. EXAM: ULTRASOUND OF THE RIGHT AXILLA COMPARISON:  Previous exam(s). FINDINGS: Targeted ultrasound is performed, showing a single morphologically abnormal low lying right axillary lymph node. The node measures up to 2 cm with 1.1 cm of  diffuse cortical thickening. IMPRESSION: Suspicious low lying right axillary lymph node corresponding with recent MRI findings. RECOMMENDATION: Recommendation is for ultrasound-guided biopsy of above findings. This was performed to follow. Please see additional dictation. I have discussed the findings and recommendations with the patient. If applicable, a reminder letter will be sent to the patient regarding the next appointment. BI-RADS CATEGORY  4: Suspicious. Electronically Signed   By: Kristopher Oppenheim M.D.   On: 11/09/2019 15:44   IR IMAGING GUIDED PORT INSERTION  Result Date: 11/07/2019 INDICATION: 44 year old with right breast cancer. Port-A-Cath needed for chemotherapy. EXAM: FLUOROSCOPIC AND ULTRASOUND GUIDED PLACEMENT OF A SUBCUTANEOUS PORT. Physician: Stephan Minister. Henn, MD MEDICATIONS: Ancef 2 g, antibiotics were given within 1 hour of the procedure. ANESTHESIA/SEDATION: Versed 4.0 mg IV; Fentanyl 100 mcg IV; Moderate Sedation Time:  49 minutes The patient was continuously monitored during the procedure by the interventional radiology nurse under my direct supervision. FLUOROSCOPY TIME:  2 minutes, 12 seconds; 17 mGy COMPLICATIONS: None immediate. PROCEDURE: The procedure was explained to the patient. The risks and benefits of the procedure were discussed and the patient's questions were addressed. Informed consent was obtained from the patient. Patient was placed supine on the interventional table. Ultrasound confirmed a patent left internal jugular vein. The left chest and neck were cleaned with a skin antiseptic and a sterile drape was placed. Maximal barrier sterile technique was utilized including caps, mask, sterile gowns, sterile gloves, sterile drape, hand hygiene and skin antiseptic. The left neck was anesthetized with 1% lidocaine. Small incision was made in the left neck with a blade. Micropuncture set was placed in the left IJ with ultrasound guidance. The micropuncture wire was used for  measurement purposes. The left chest was anesthetized with 1% lidocaine with epinephrine. #15 blade was used to make an incision and a subcutaneous port pocket was formed. [place] was assembled. Subcutaneous tunnel was formed with a stiff tunneling device. The port catheter was brought through the subcutaneous tunnel. The port was placed in the subcutaneous pocket. The micropuncture set was exchanged for a peel-away sheath. The catheter was placed through the peel-away  sheath and the tip was noted to be in the upper SVC. This placement was not felt to be adequate for long-term catheter placement. Unable to remove the catheter through the vein dermatotomy site. Therefore, the entire catheter was removed. Using ultrasound guidance, a 21 gauge needle was directed into the left internal jugular vein and wire was advanced centrally. Another micropuncture dilator set was placed. A new 8 French port catheter was tunneled through the subcutaneous tract to the vein dermatotomy site. The micropuncture catheter was exchanged for a peel-away sheath over a J wire. Catheter was advanced through the peel-away sheath and directed into the central venous system. Catheter was pulled back into the upper right atrium region. Catheter was cut to an appropriate length and connected to the port device. Port device was placed in the subcutaneous port pocket. Port was accessed. Port aspirated and flushed well. The port pocket was closed using two layers of absorbable sutures and Dermabond. The vein skin site was closed using a single layer of absorbable suture and Dermabond. Access needle was kept in place after the procedure. Sterile dressings were applied. Patient tolerated the procedure well without an immediate complication. Ultrasound and fluoroscopic images were taken and saved for this procedure. FINDINGS: Patent left internal jugular vein. Initially, the Port-A-Cath tip was in the upper SVC/innominate junction and not  adequate for long-term use. Therefore, a new catheter was placed and the tip was placed in the upper right atrium near the SVC and right atrium junction. IMPRESSION: Placement of a subcutaneous port device. Electronically Signed   By: Markus Daft M.D.   On: 11/07/2019 14:47     ELIGIBLE FOR AVAILABLE RESEARCH PROTOCOL: E3154  ASSESSMENT: 44 y.o. Gleason woman status post right breast upper inner quadrant biopsy 10/03/2019 for a clinical mT3 N1, stage IIIC invasive ductal carcinoma, grade 3, triple negative, with an MIB-1 of 80%  (a) biopsy of a right axillary node  (b) CT chest/abd/pelvis 10/31/2019 shows 1.5 cm hypodense lesion in liver, lung nodules <0.3 cm  (c) bone scan 10/31/2019 negative  (d) liver MRI 11/11/2019 confirms a targetoid lesion in the left hepatic lobe suspicious for metastasis  (e) baseline CA 27-29 on 11/16/2019  (1) genetics testing   (2) neoadjuvant chemotherapy to consist of cyclophosphamide and doxorubicin in dose dense fashion x4 followed by paclitaxel and carboplatin weekly x12  (a) echo 10/24/2019 shows an ejection fraction in the 60-65% range  (3) definitive surgery to follow  (4) adjuvant radiation  (5) thalassemia: Ferritin 10/17/2019 was 45 with saturation 25%, hemoglobin 11.8 and MCV 71.5   PLAN:  Krista [name] tolerated her first cycle of chemotherapy generally well.  This is the more complex cycle because of course we cannot quite predict what will happen.  She did not do well with the [place] and that has been eliminated.  She is still taking her antinausea medicine and I asked her to stop.  She understands when she stops the steroid she will feel a slight slump.  Her size walk and do yoga.  I commended how she continues to exercise, walk and do yoga.  Her counts today are excellent and she will not need intervening antibiotics prophylactically  Given her prior discussion I emphasized that we expect to [name] her and that her chance of [name] is very  high  She will see me again in a week.  She knows to call for any issue that may develop before then  Total encounter time 25 minutes.Krista Jews C.  Kalif Kattner, MD 11/22/2019 1:52 PM Medical Oncology and Hematology [facility] [place], [place] [address] Tel. [phone]    Fax. [phone]   This document serves as a record of services personally performed by Lurline Del, MD. It was created on his behalf by Krista [name], a trained medical scribe. The creation of this record is based on the scribe's personal observations and the provider's statements to them.   I, Lurline Del MD, have reviewed the above documentation for accuracy and completeness, and I agree with the above.   *Total Encounter Time as defined by the Centers for Medicare and Medicaid Services includes, in addition to the face-to-face time of a patient visit (documented in the note above) non-face-to-face time: obtaining and reviewing outside history, ordering and reviewing medications, tests or procedures, care coordination (communications with other health care professionals or caregivers) and documentation in the medical record.

## 2019-11-22 ENCOUNTER — Ambulatory Visit: Payer: BC Managed Care – PPO

## 2019-11-22 ENCOUNTER — Other Ambulatory Visit: Payer: Self-pay

## 2019-11-22 ENCOUNTER — Other Ambulatory Visit: Payer: Self-pay | Admitting: Radiology

## 2019-11-22 ENCOUNTER — Inpatient Hospital Stay: Payer: BC Managed Care – PPO

## 2019-11-22 ENCOUNTER — Inpatient Hospital Stay (HOSPITAL_BASED_OUTPATIENT_CLINIC_OR_DEPARTMENT_OTHER): Payer: BC Managed Care – PPO | Admitting: Oncology

## 2019-11-22 VITALS — BP 170/98 | HR 85 | Temp 99.1°F | Resp 20 | Ht 67.0 in | Wt 212.1 lb

## 2019-11-22 DIAGNOSIS — C50211 Malignant neoplasm of upper-inner quadrant of right female breast: Secondary | ICD-10-CM

## 2019-11-22 DIAGNOSIS — C50411 Malignant neoplasm of upper-outer quadrant of right female breast: Secondary | ICD-10-CM

## 2019-11-22 DIAGNOSIS — Z171 Estrogen receptor negative status [ER-]: Secondary | ICD-10-CM

## 2019-11-22 DIAGNOSIS — D563 Thalassemia minor: Secondary | ICD-10-CM

## 2019-11-22 DIAGNOSIS — Z95828 Presence of other vascular implants and grafts: Secondary | ICD-10-CM

## 2019-11-22 LAB — COMPREHENSIVE METABOLIC PANEL
ALT: 16 U/L (ref 0–44)
AST: 12 U/L — ABNORMAL LOW (ref 15–41)
Albumin: 3.8 g/dL (ref 3.5–5.0)
Alkaline Phosphatase: 109 U/L (ref 38–126)
Anion gap: 8 (ref 5–15)
BUN: 13 mg/dL (ref 6–20)
CO2: 25 mmol/L (ref 22–32)
Calcium: 8.7 mg/dL — ABNORMAL LOW (ref 8.9–10.3)
Chloride: 105 mmol/L (ref 98–111)
Creatinine, Ser: 0.78 mg/dL (ref 0.44–1.00)
GFR calc Af Amer: >60 mL/min (ref >60)
GFR calc non Af Amer: >60 mL/min (ref >60)
Glucose, Bld: 173 mg/dL — ABNORMAL HIGH (ref 70–99)
Potassium: 3.8 mmol/L (ref 3.5–5.1)
Sodium: 138 mmol/L (ref 135–145)
Total Bilirubin: 0.3 mg/dL (ref 0.3–1.2)
Total Protein: 7.3 g/dL (ref 6.5–8.1)

## 2019-11-22 LAB — CBC WITH DIFFERENTIAL/PLATELET
Abs Immature Granulocytes: 0 10*3/uL (ref 0.00–0.07)
Band Neutrophils: 3 %
Basophils Absolute: 0 10*3/uL (ref 0.0–0.1)
Basophils Relative: 0 %
Eosinophils Absolute: 0 10*3/uL (ref 0.0–0.5)
Eosinophils Relative: 0 %
HCT: 31.6 % — ABNORMAL LOW (ref 36.0–46.0)
Hemoglobin: 11.1 g/dL — ABNORMAL LOW (ref 12.0–15.0)
Lymphocytes Relative: 3 %
Lymphs Abs: 0.7 10*3/uL (ref 0.7–4.0)
MCH: 25 pg — ABNORMAL LOW (ref 26.0–34.0)
MCHC: 35.1 g/dL (ref 30.0–36.0)
MCV: 71.2 fL — ABNORMAL LOW (ref 80.0–100.0)
Monocytes Absolute: 0 10*3/uL — ABNORMAL LOW (ref 0.1–1.0)
Monocytes Relative: 0 %
Neutro Abs: 23.2 10*3/uL — ABNORMAL HIGH (ref 1.7–7.7)
Neutrophils Relative %: 94 %
Platelets: 270 10*3/uL (ref 150–400)
RBC: 4.44 MIL/uL (ref 3.87–5.11)
RDW: 15.6 % — ABNORMAL HIGH (ref 11.5–15.5)
WBC: 23.9 10*3/uL — ABNORMAL HIGH (ref 4.0–10.5)
nRBC: 0 % (ref 0.0–0.2)

## 2019-11-22 MED ORDER — HEPARIN SOD (PORK) LOCK FLUSH 100 UNIT/ML IV SOLN
500.0000 [IU] | Freq: Once | INTRAVENOUS | Status: AC
  Administered 2019-11-22: 500 [IU] via INTRAVENOUS
  Filled 2019-11-22: qty 5

## 2019-11-22 MED ORDER — SODIUM CHLORIDE 0.9% FLUSH
10.0000 mL | INTRAVENOUS | Status: DC | PRN
  Administered 2019-11-22: 10 mL via INTRAVENOUS
  Filled 2019-11-22: qty 10

## 2019-11-22 NOTE — Progress Notes (Signed)
Pharmacist Chemotherapy Monitoring - Follow Up Assessment    I verify that I have reviewed each item in the below checklist:  . Regimen for the patient is scheduled for the appropriate day and plan matches scheduled date. Marland Kitchen Appropriate non-routine labs are ordered dependent on drug ordered. . If applicable, additional medications reviewed and ordered per protocol based on lifetime cumulative doses and/or treatment regimen.   Plan for follow-up and/or issues identified: No . I-vent associated with next due treatment: No . MD and/or nursing notified: No  Krista [name] D 11/22/2019 2:36 PM

## 2019-11-23 ENCOUNTER — Ambulatory Visit (HOSPITAL_COMMUNITY)
Admission: RE | Admit: 2019-11-23 | Discharge: 2019-11-23 | Disposition: A | Discharge status: Home / Self Care | Payer: BC Managed Care – PPO | Source: Ambulatory Visit | Attending: Oncology | Admitting: Oncology

## 2019-11-23 ENCOUNTER — Other Ambulatory Visit: Payer: Self-pay | Admitting: Student

## 2019-11-23 ENCOUNTER — Ambulatory Visit: Payer: BC Managed Care – PPO | Admitting: Oncology

## 2019-11-23 ENCOUNTER — Other Ambulatory Visit: Payer: Self-pay

## 2019-11-23 ENCOUNTER — Encounter (HOSPITAL_COMMUNITY): Payer: Self-pay

## 2019-11-23 ENCOUNTER — Other Ambulatory Visit: Payer: BC Managed Care – PPO

## 2019-11-23 DIAGNOSIS — Z79899 Other long term (current) drug therapy: Secondary | ICD-10-CM | POA: Diagnosis not present

## 2019-11-23 DIAGNOSIS — Z171 Estrogen receptor negative status [ER-]: Secondary | ICD-10-CM | POA: Insufficient documentation

## 2019-11-23 DIAGNOSIS — C50211 Malignant neoplasm of upper-inner quadrant of right female breast: Secondary | ICD-10-CM

## 2019-11-23 DIAGNOSIS — K769 Liver disease, unspecified: Secondary | ICD-10-CM | POA: Insufficient documentation

## 2019-11-23 DIAGNOSIS — Z803 Family history of malignant neoplasm of breast: Secondary | ICD-10-CM | POA: Insufficient documentation

## 2019-11-23 LAB — PROTIME-INR
INR: 1.1 (ref 0.8–1.2)
Prothrombin Time: 13.7 seconds (ref 11.4–15.2)

## 2019-11-23 LAB — PREGNANCY, URINE: Preg Test, Ur: NEGATIVE

## 2019-11-23 MED ORDER — MIDAZOLAM HCL 2 MG/2ML IJ SOLN
INTRAMUSCULAR | Status: AC | PRN
  Administered 2019-11-23: 1 mg via INTRAVENOUS
  Administered 2019-11-23 (×2): 0.5 mg via INTRAVENOUS

## 2019-11-23 MED ORDER — FENTANYL CITRATE (PF) 100 MCG/2ML IJ SOLN
INTRAMUSCULAR | Status: AC | PRN
  Administered 2019-11-23 (×3): 25 ug via INTRAVENOUS

## 2019-11-23 MED ORDER — FENTANYL CITRATE (PF) 100 MCG/2ML IJ SOLN
INTRAMUSCULAR | Status: AC
  Filled 2019-11-23: qty 2

## 2019-11-23 MED ORDER — SODIUM CHLORIDE 0.9 % IV SOLN: INTRAVENOUS | Status: DC

## 2019-11-23 MED ORDER — LIDOCAINE HCL (PF) 1 % IJ SOLN
INTRAMUSCULAR | Status: AC
  Filled 2019-11-23: qty 30

## 2019-11-23 MED ORDER — SODIUM CHLORIDE 0.9 % IV SOLN
INTRAVENOUS | Status: AC | PRN
  Administered 2019-11-23: 10 mL/h via INTRAVENOUS

## 2019-11-23 MED ORDER — GELATIN ABSORBABLE 12-7 MM EX MISC
CUTANEOUS | Status: AC
  Filled 2019-11-23: qty 1

## 2019-11-23 MED ORDER — MIDAZOLAM HCL 2 MG/2ML IJ SOLN
INTRAMUSCULAR | Status: AC
  Filled 2019-11-23: qty 2

## 2019-11-23 NOTE — Procedures (Signed)
Interventional Radiology Procedure Note  Procedure: US Guided Biopsy of liver lesion  Complications: None  Estimated Blood Loss: < 10 mL  Findings: 18 G core biopsy of left lobe liver lesion performed under US guidance.  Two core samples obtained and sent to Pathology.  Venetia Night. Kathlene Cote, M.D Pager:  [phone]

## 2019-11-23 NOTE — H&P (Signed)
Chief Complaint: Patient was seen in consultation today for liver lesion  Referring Physician(s): Chauncey Cruel  Supervising Physician: Aletta Edouard  Patient Status: [name] - Out-pt  History of Present Illness: Krista [name] is a 44 y.o. female with past medical history of anxiety recently diagnosed with metastatic triple negative breast cancer. She underwent lymph node biopsy 11/09/19 which was positive for metastatic carcinoma.  An MR Abdomen also showed a solitary liver lesion.  She presents for liver lesion biopsy at the request of Dr. Jana Hakim.  [name] presents to IR today in her usual state of health. She has no new complaints or concerns.  She is agreeable to liver lesion biopsy.  Patient has been NPO.  She does not take blood thinners.   Past Medical History:  Diagnosis Date  . Anxiety   . Cancer ([place])   . Family history of breast cancer   . Family history of cervical cancer   . Family history of lung cancer   . Hemorrhoids     Past Surgical History:  Procedure Laterality Date  . IR IMAGING GUIDED PORT INSERTION  11/07/2019    Allergies: Emend [fosaprepitant]  Medications: Prior to Admission medications   Medication Sig Start Date End Date Taking? Authorizing Provider  acetaminophen (TYLENOL) 500 MG tablet Take 1-2 tablets (500-1,000 mg total) by mouth every 6 (six) hours as needed. Patient taking differently: Take 500-1,000 mg by mouth every 6 (six) hours as needed for moderate pain or fever.  11/07/19  Yes Magrinat, [name], MD  desloratadine (CLARINEX) 5 MG tablet Take 5 mg by mouth daily.   Yes [provider]  dexamethasone (DECADRON) 4 MG tablet Take 2 tablets by mouth daily starting the day after Cytoxan x 3 days. Take with food. Patient taking differently: Take 8 mg by mouth daily.  11/07/19  Yes Magrinat, [name], MD  lidocaine-prilocaine (EMLA) cream Apply to affected area once 10/17/19  Yes Magrinat, [name], MD   loratadine (CLARITIN) 10 MG tablet Take 1 tablet (10 mg total) by mouth daily. 11/07/19  Yes Magrinat, [name], MD  LORazepam (ATIVAN) 0.5 MG tablet Take 1 tablet (0.5 mg total) by mouth at bedtime as needed (Nausea or vomiting). 10/17/19  Yes Magrinat, [name], MD  Multiple Vitamins-Minerals (MULTIVITAMIN WITH MINERALS) tablet Take 2 tablets by mouth daily.   Yes [provider]  prochlorperazine (COMPAZINE) 10 MG tablet Take 1 tablet (10 mg total) by mouth every 6 (six) hours as needed (Nausea or vomiting). 10/17/19  Yes Magrinat, [name], MD     Family History  Problem Relation Age of Onset  . Breast cancer Mother 62       double mastectomy  . Breast cancer Half-Sister 68  . Cirrhosis Half-Sister   . Heart Problems Father   . Stroke Father   . Breast cancer Maternal Aunt 82  . Cancer Maternal Uncle 23       unknown type  . Cervical cancer Maternal Grandmother        dx. in her mid-40s  . Lung cancer Paternal Grandmother   . Cancer Other        unknown type; maternal great-uncles/aunts (x3)  . Diabetes Half-Brother   . Cancer Other        unknown type; matenral great-uncles/aunt (x4)  . Cancer Maternal Great-grandfather        unknown type  . Breast cancer Cousin 57       paternal cousin  Social History   Socioeconomic History  . Marital status: Married    Spouse name: Not on file  . Number of children: Not on file  . Years of education: Not on file  . Highest education level: Not on file  Occupational History  . Not on file  Tobacco Use  . Smoking status: Never Smoker  . Smokeless tobacco: Never Used  Substance and Sexual Activity  . Alcohol use: Not Currently    Comment: Social drinker  . Drug use: Never  . Sexual activity: Not Currently  Other Topics Concern  . Not on file  Social History Narrative  . Not on file   Social Determinants of Health   Financial Resource Strain:   . Difficulty of Paying Living Expenses:   Food Insecurity:   .  Worried About Charity fundraiser in the Last Year:   . Arboriculturist in the Last Year:   Transportation Needs:   . Film/video editor (Medical):   Marland Kitchen Lack of Transportation (Non-Medical):   Physical Activity:   . Days of Exercise per Week:   . Minutes of Exercise per Session:   Stress:   . Feeling of Stress :   Social Connections:   . Frequency of Communication with Friends and Family:   . Frequency of Social Gatherings with Friends and Family:   . Attends Religious Services:   . Active Member of Clubs or Organizations:   . Attends Archivist Meetings:   Marland Kitchen Marital Status:      Review of Systems: A 12 point ROS discussed and pertinent positives are indicated in the HPI above.  All other systems are negative.  Review of Systems  Constitutional: Negative for fatigue and fever.  Respiratory: Negative for cough and shortness of breath.   Cardiovascular: Negative for chest pain.  Gastrointestinal: Negative for abdominal pain, nausea and vomiting.  Genitourinary: Negative for dysuria.  Musculoskeletal: Negative for back pain.  Psychiatric/Behavioral: Negative for behavioral problems and confusion.    Vital Signs: BP (!) 168/106   Pulse 78   Temp 97.9 F (36.6 C)   Resp 16   Ht 5' 7"  (1.702 m)   Wt 212 lb (96.2 kg)   LMP 11/23/2019 (Exact Date)   SpO2 100%   BMI 33.20 kg/m   Physical Exam Vitals and nursing note reviewed.  Constitutional:      General: She is not in acute distress.    Appearance: Normal appearance. She is not ill-appearing.  HENT:     Mouth/Throat:     Mouth: Mucous membranes are moist.     Pharynx: Oropharynx is clear.  Cardiovascular:     Rate and Rhythm: Normal rate and regular rhythm.  Pulmonary:     Effort: Pulmonary effort is normal.     Breath sounds: Normal breath sounds.  Abdominal:     General: Abdomen is flat.     Palpations: Abdomen is soft.  Skin:    General: Skin is warm and dry.  Neurological:     General: No  focal deficit present.     Mental Status: She is alert and oriented to person, place, and time. Mental status is at baseline.  Psychiatric:        Mood and Affect: Mood normal.        Behavior: Behavior normal.        Thought Content: Thought content normal.        Judgment: Judgment normal.  MD Evaluation Airway: WNL Heart: WNL Abdomen: WNL Chest/ Lungs: WNL ASA  Classification: 3 Mallampati/Airway Score: One   Imaging: CT Chest W Contrast  Result Date: 10/31/2019 CLINICAL DATA:  Breast cancer staging, diagnosed 3 weeks ago. EXAM: CT CHEST, ABDOMEN, AND PELVIS WITH CONTRAST TECHNIQUE: Multidetector CT imaging of the chest, abdomen and pelvis was performed following the standard protocol during bolus administration of intravenous contrast. CONTRAST:  158m OMNIPAQUE IOHEXOL 300 MG/ML  SOLN COMPARISON:  None. FINDINGS: CT CHEST FINDINGS Cardiovascular: Heart is at the upper limits of normal in size to mildly enlarged. No pericardial effusion. Mediastinum/Nodes: Heterogeneous lymph node in the right axilla measures 1.5 cm (2/19). No additional pathologically enlarged axillary, internal mammary, mediastinal or hilar lymph nodes. Esophagus is unremarkable. Lungs/Pleura: 2 mm subpleural posterior right upper lobe nodule (7/52). 1 mm inferior left lower lobe nodule (7/141). No pleural fluid. Airway is unremarkable. Musculoskeletal: Heterogeneous lobulated mass with surrounding nodularity in the right breast measures at least 3.4 x 4.9 cm (2/13). There is a soft tissue nodule in the lateral left breast measuring 1.7 x 2.0 cm (2/22). No worrisome lytic or sclerotic lesions. CT ABDOMEN PELVIS FINDINGS Hepatobiliary: Ill-defined hypodense lesion in segment 4 of the liver measures 1.5 cm (2/59). Liver and gallbladder are otherwise unremarkable. No biliary ductal dilatation. Pancreas: Negative. Spleen: Negative. Adrenals/Urinary Tract: Adrenal glands and kidneys are unremarkable. Mild dilatation of the  ureters bilaterally, left greater than right, possibly due to an enlarged uterus. 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 Bladder is grossly unremarkable. Stomach/Bowel: Stomach, small bowel, appendix and colon are unremarkable. Fair amount of stool in the colon is indicative of constipation. Vascular/Lymphatic: Vascular structures are unremarkable. No pathologically enlarged lymph nodes. Reproductive: Uterus is enlarged and heterogeneous, with a dominant mass measuring approximately 8.6 cm (6/104). Prominent nabothian cysts. No adnexal mass. Other: No free fluid.  Mesenteries and peritoneum are unremarkable. Musculoskeletal: No worrisome lytic or sclerotic lesions. IMPRESSION: 1. Large right breast mass with metastatic right axillary adenopathy. 2. Ill-defined low-attenuation lesion in segment 4 of the liver is worrisome for metastatic disease. Further evaluation with MR abdomen without and with contrast could be performed, as clinically indicated. 3. Lateral left breast nodule. Patient recently underwent sonographic and MRI evaluation on 09/06/2019 and 10/27/2019, respectively. 4. Enlarged, heterogeneous and fibroid uterus which may cause mild bilateral ureteral dilatation, left greater than right. 5. Tiny subpleural pulmonary nodules are nonspecific and not felt to be metastatic. Continued attention on follow-up exams is suggested. Electronically Signed   By: MLorin PicketM.D.   On: 10/31/2019 11:45   NM Bone Scan Whole Body  Result Date: 10/31/2019 CLINICAL DATA:  RIGHT breast cancer, staging EXAM: NUCLEAR MEDICINE WHOLE BODY BONE SCAN TECHNIQUE: Whole body anterior and posterior images were obtained approximately 3 hours after intravenous injection of radiopharmaceutical. RADIOPHARMACEUTICALS:  21 mCi Technetium-959mDP IV COMPARISON:  None Correlation: CT chest abdomen pelvis 10/31/2019 FINDINGS: Symmetric calvarial uptake, nonspecific. Uptake at the  shoulders, sternoclavicular joints, hips, and knees, typically degenerative. No definite sites of abnormal osseous tracer uptake are seen to suggest osseous metastatic disease. Small amount retained tracer within LEFT renal collecting system, corresponding to mildly dilated duplicated collecting system/proximal ureters seen on CT. Remaining urinary tract and soft tissue distribution of tracer unremarkable. IMPRESSION: No scintigraphic evidence of osseous metastatic disease. Electronically Signed   By: MaLavonia Dana.D.   On: 10/31/2019 16:15   CT Abdomen Pelvis W Contrast  Result Date:  10/31/2019 CLINICAL DATA:  Breast cancer staging, diagnosed 3 weeks ago. EXAM: CT CHEST, ABDOMEN, AND PELVIS WITH CONTRAST TECHNIQUE: Multidetector CT imaging of the chest, abdomen and pelvis was performed following the standard protocol during bolus administration of intravenous contrast. CONTRAST:  128m OMNIPAQUE IOHEXOL 300 MG/ML  SOLN COMPARISON:  None. FINDINGS: CT CHEST FINDINGS Cardiovascular: Heart is at the upper limits of normal in size to mildly enlarged. No pericardial effusion. Mediastinum/Nodes: Heterogeneous lymph node in the right axilla measures 1.5 cm (2/19). No additional pathologically enlarged axillary, internal mammary, mediastinal or hilar lymph nodes. Esophagus is unremarkable. Lungs/Pleura: 2 mm subpleural posterior right upper lobe nodule (7/52). 1 mm inferior left lower lobe nodule (7/141). No pleural fluid. Airway is unremarkable. Musculoskeletal: Heterogeneous lobulated mass with surrounding nodularity in the right breast measures at least 3.4 x 4.9 cm (2/13). There is a soft tissue nodule in the lateral left breast measuring 1.7 x 2.0 cm (2/22). No worrisome lytic or sclerotic lesions. CT ABDOMEN PELVIS FINDINGS Hepatobiliary: Ill-defined hypodense lesion in segment 4 of the liver measures 1.5 cm (2/59). Liver and gallbladder are otherwise unremarkable. No biliary ductal dilatation. Pancreas: Negative.  Spleen: Negative. Adrenals/Urinary Tract: Adrenal glands and kidneys are unremarkable. Mild dilatation of the ureters bilaterally, left greater than right, possibly due to an enlarged uterus. 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 Bladder is grossly unremarkable. Stomach/Bowel: Stomach, small bowel, appendix and colon are unremarkable. Fair amount of stool in the colon is indicative of constipation. Vascular/Lymphatic: Vascular structures are unremarkable. No pathologically enlarged lymph nodes. Reproductive: Uterus is enlarged and heterogeneous, with a dominant mass measuring approximately 8.6 cm (6/104). Prominent nabothian cysts. No adnexal mass. Other: No free fluid.  Mesenteries and peritoneum are unremarkable. Musculoskeletal: No worrisome lytic or sclerotic lesions. IMPRESSION: 1. Large right breast mass with metastatic right axillary adenopathy. 2. Ill-defined low-attenuation lesion in segment 4 of the liver is worrisome for metastatic disease. Further evaluation with MR abdomen without and with contrast could be performed, as clinically indicated. 3. Lateral left breast nodule. Patient recently underwent sonographic and MRI evaluation on 09/06/2019 and 10/27/2019, respectively. 4. Enlarged, heterogeneous and fibroid uterus which may cause mild bilateral ureteral dilatation, left greater than right. 5. Tiny subpleural pulmonary nodules are nonspecific and not felt to be metastatic. Continued attention on follow-up exams is suggested. Electronically Signed   By: MLorin PicketM.D.   On: 10/31/2019 11:45   MR LIVER W WO CONTRAST  Result Date: 11/12/2019 CLINICAL DATA:  Breast cancer staging. EXAM: MRI ABDOMEN WITHOUT AND WITH CONTRAST TECHNIQUE: Multiplanar multisequence MR imaging of the abdomen was performed both before and after the administration of intravenous contrast. CONTRAST:  160mGADAVIST GADOBUTROL 1 MMOL/ML IV SOLN COMPARISON:  Chest  abdomen and pelvis 10/31/2019 FINDINGS: Lower chest: Incidental imaging of the lung bases is unremarkable, limited assessment on MRI. Hepatobiliary: Targetoid lesion in the medial segment of the LEFT hepatic lobe (image 17, series 5 and image 58 of series 8 displays restricted diffusion. Lesion displays peripheral enhancement, central necrosis and peripheral washout. Additional tiny focus of diffusion related signal, see below. Pancreas: Pancreas without sign of inflammation, focal lesion or ductal dilation. Spleen:  Spleen normal size without focal lesion. Adrenals/Urinary Tract: Adrenal glands are normal. Smooth renal contours. Stomach/Bowel: Stomach is distended with ingested contents. No acute gastrointestinal process to the extent evaluated, study not protocol for bowel evaluation. Normal appendix. Vascular/Lymphatic: Vascular structures in the abdomen are patent. No adenopathy. Other:  Large myomatous uterus partially visualized in the pelvis. No ascites. Small fat containing umbilical hernia. Musculoskeletal: No acute musculoskeletal process. IMPRESSION: 1. Targetoid lesion in the medial segment of the LEFT hepatic lobe displays restricted diffusion with target like enhancement with features of metastatic disease. Tissue sampling may be helpful. 2. Subtle focus of diffusion related signal along a venous branch in the lateral segment LEFT hepatic lobe just above the inter lobar fissure (series 8, image 11). No corresponding abnormality on contrasted imaging. This could represent slow flow in a venous branch in plane with the scan or a small focus of disease. Close attention on follow-up is suggested. 3. No other evidence of metastatic disease within the abdomen. 4. 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 Electronically Signed   By: Zetta Bills M.D.   On: 11/12/2019 15:31   MR BREAST BILATERAL W WO CONTRAST INC CAD  Result Date: 10/27/2019 CLINICAL DATA:  44 year old female with  recently diagnosed grade 3 invasive ductal carcinoma of the right breast post ultrasound-guided core biopsy of a 5.1 cm palpable mass at the 1 o'clock position. The patient does have a very strong family history of breast cancer, including in her mother, sister as well as a maternal aunt all diagnosed before age 28. LABS:  Not applicable. EXAM: BILATERAL BREAST MRI WITH AND WITHOUT CONTRAST TECHNIQUE: Multiplanar, multisequence MR images of both breasts were obtained prior to and following the intravenous administration of 9 ml of Gadavist Three-dimensional MR images were rendered by post-processing of the original MR data on an independent workstation. The three-dimensional MR images were interpreted, and findings are reported in the following complete MRI report for this study. Three dimensional images were evaluated at the independent DynaCad workstation COMPARISON:  Prior mammograms and ultrasounds dated 09/26/2019 and 10/03/2019. FINDINGS: Breast composition: c.  Heterogeneous fibroglandular tissue. Background parenchymal enhancement: Mild. Right breast: Heterogeneously enhancing partially necrotic mass at site of biopsy proven malignancy in the upper inner right breast measures up to 7.9 cm AP, 4.6 cm transverse, and 4.6 cm craniocaudal. Several small satellite masses are present along the inferior margin of the dominant mass (subtraction images 68-70. In addition, there is linear oriented non mass enhancement (subtraction image 79) in the lower inner right breast measuring 3.5 cm AP. Left breast: No mass or abnormal enhancement. Lymph nodes: There is a morphologically abnormal level I in the right axilla (series 3, image 46) measuring up to 2.2 cm. Ancillary findings:  None. IMPRESSION: 1. Biopsy proven malignancy in the upper inner right breast with small adjacent satellite masses along the inferior margin measures up to 7.9 x 4.6 x 4.6 cm. 2. Suspicious linear oriented enhancement in the lower inner  quadrant of the right breast slightly inferior to the dominant mass measures up to 3.5 cm. 3.  Morphologically abnormal level I lymph node in the right axilla. 4.  No MRI evidence of malignancy in the left breast. RECOMMENDATION: 1. Recommend the patient return for second-look ultrasound and biopsy of the morphologically abnormal lymph node in the right axilla. 2. If breast conservation is a consideration, then recommend the patient return for MRI guided biopsy of the linear non mass enhancement in the lower inner quadrant of the right breast (subtraction image 79). BI-RADS CATEGORY  4: Suspicious. Electronically Signed   By: Everlean Alstrom M.D.   On: 10/27/2019 15:51   [place] AXILLARY NODE CORE BIOPSY RIGHT  Addendum Date: 11/21/2019   ADDENDUM REPORT: 11/10/2019 14:43 ADDENDUM: Pathology revealed METASTATIC CARCINOMA of the  Right axillary lymph node. This was found to be concordant by Dr. Kristopher Oppenheim. Pathology results were discussed with the patient by telephone. The patient reported doing well after the biopsy with tenderness at the site. Post biopsy instructions and care were reviewed and questions were answered. The patient was encouraged to call [facility] for any additional concerns. The patient has a recent diagnosis of Right breast cancer and should follow her outlined treatment plan. Bary Castilla, RN Nurse Navigator, with [facility] was notified of biopsy results via EPIC message on Nov 10, 2019. Pathology results reported by Terie Purser, RN on 11/10/2019. Electronically Signed   By: Kristopher Oppenheim M.D.   On: 11/10/2019 14:43   Result Date: 11/21/2019 CLINICAL DATA:  44 year old female with recently diagnosed right breast cancer presents for biopsy of an enlarged axillary lymph node. EXAM: [place] AXILLARY NODE CORE BIOPSY RIGHT COMPARISON:  Previous exam(s). PROCEDURE: I met with the patient and we discussed the procedure of ultrasound-guided biopsy, including  benefits and alternatives. We discussed the high likelihood of a successful procedure. We discussed the risks of the procedure, including infection, bleeding, tissue injury, clip migration, and inadequate sampling. Informed written consent was given. The usual time-out protocol was performed immediately prior to the procedure. Using sterile technique and 1% Lidocaine as local anesthetic, under direct ultrasound visualization, a 14 gauge spring-loaded device was used to perform biopsy of a low lying right axillary lymph node using a lateral approach. At the conclusion of the procedure a post biopsy tissue marker clip was deployed into the biopsy cavity. Follow up 2 view mammogram was deferred. IMPRESSION: Ultrasound guided biopsy of a right axillary lymph node. No apparent complications. Electronically Signed: By: Kristopher Oppenheim M.D. On: 11/09/2019 15:43   [place] AXILLA RIGHT  Result Date: 11/09/2019 CLINICAL DATA:  Patient presents for second-look evaluation of a prominent right axillary lymph node seen on recent staging MRI. EXAM: ULTRASOUND OF THE RIGHT AXILLA COMPARISON:  Previous exam(s). FINDINGS: Targeted ultrasound is performed, showing a single morphologically abnormal low lying right axillary lymph node. The node measures up to 2 cm with 1.1 cm of diffuse cortical thickening. IMPRESSION: Suspicious low lying right axillary lymph node corresponding with recent MRI findings. RECOMMENDATION: Recommendation is for ultrasound-guided biopsy of above findings. This was performed to follow. Please see additional dictation. I have discussed the findings and recommendations with the patient. If applicable, a reminder letter will be sent to the patient regarding the next appointment. BI-RADS CATEGORY  4: Suspicious. Electronically Signed   By: Kristopher Oppenheim M.D.   On: 11/09/2019 15:44   IR IMAGING GUIDED PORT INSERTION  Result Date: 11/07/2019 INDICATION: 44 year old with right breast cancer. Port-A-Cath needed  for chemotherapy. EXAM: FLUOROSCOPIC AND ULTRASOUND GUIDED PLACEMENT OF A SUBCUTANEOUS PORT. Physician: Stephan Minister. Henn, MD MEDICATIONS: Ancef 2 g, antibiotics were given within 1 hour of the procedure. ANESTHESIA/SEDATION: Versed 4.0 mg IV; Fentanyl 100 mcg IV; Moderate Sedation Time:  49 minutes The patient was continuously monitored during the procedure by the interventional radiology nurse under my direct supervision. FLUOROSCOPY TIME:  2 minutes, 12 seconds; 17 mGy COMPLICATIONS: None immediate. PROCEDURE: The procedure was explained to the patient. The risks and benefits of the procedure were discussed and the patient's questions were addressed. Informed consent was obtained from the patient. Patient was placed supine on the interventional table. Ultrasound confirmed a patent left internal jugular vein. The left chest and neck were cleaned with a skin antiseptic  and a sterile drape was placed. Maximal barrier sterile technique was utilized including caps, mask, sterile gowns, sterile gloves, sterile drape, hand hygiene and skin antiseptic. The left neck was anesthetized with 1% lidocaine. Small incision was made in the left neck with a blade. Micropuncture set was placed in the left IJ with ultrasound guidance. The micropuncture wire was used for measurement purposes. The left chest was anesthetized with 1% lidocaine with epinephrine. #15 blade was used to make an incision and a subcutaneous port pocket was formed. Holley was assembled. Subcutaneous tunnel was formed with a stiff tunneling device. The port catheter was brought through the subcutaneous tunnel. The port was placed in the subcutaneous pocket. The micropuncture set was exchanged for a peel-away sheath. The catheter was placed through the peel-away sheath and the tip was noted to be in the upper SVC. This placement was not felt to be adequate for long-term catheter placement. Unable to remove the catheter through the vein dermatotomy  site. Therefore, the entire catheter was removed. Using ultrasound guidance, a 21 gauge needle was directed into the left internal jugular vein and wire was advanced centrally. Another micropuncture dilator set was placed. A new 8 French port catheter was tunneled through the subcutaneous tract to the vein dermatotomy site. The micropuncture catheter was exchanged for a peel-away sheath over a J wire. Catheter was advanced through the peel-away sheath and directed into the central venous system. Catheter was pulled back into the upper right atrium region. Catheter was cut to an appropriate length and connected to the port device. Port device was placed in the subcutaneous port pocket. Port was accessed. Port aspirated and flushed well. The port pocket was closed using two layers of absorbable sutures and Dermabond. The vein skin site was closed using a single layer of absorbable suture and Dermabond. Access needle was kept in place after the procedure. Sterile dressings were applied. Patient tolerated the procedure well without an immediate complication. Ultrasound and fluoroscopic images were taken and saved for this procedure. FINDINGS: Patent left internal jugular vein. Initially, the Port-A-Cath tip was in the upper SVC/innominate junction and not adequate for long-term use. Therefore, a new catheter was placed and the tip was placed in the upper right atrium near the SVC and right atrium junction. IMPRESSION: Placement of a subcutaneous port device. Electronically Signed   By: Markus Daft M.D.   On: 11/07/2019 14:47    Labs:  CBC: Recent Labs    10/17/19 1529 11/07/19 1022 11/16/19 0815 11/22/19 1234  WBC 6.9 8.5 8.2 23.9*  HGB 11.8* 12.2 11.8* 11.1*  HCT 34.3* 35.7* 33.4* 31.6*  PLT 269 276 234 270    COAGS: Recent Labs    11/07/19 1022 11/23/19 1245  INR 1.1 1.1    BMP: Recent Labs    10/17/19 1529 11/16/19 0815 11/22/19 1234  NA 141 141 138  K 4.0 3.8 3.8  CL 106 108 105    CO2 24 23 25   GLUCOSE 87 102* 173*  BUN 10 10 13   CALCIUM 8.9 9.1 8.7*  CREATININE 0.95 0.86 0.78  GFRNONAA >60 >60 >60  GFRAA >60 >60 >60    LIVER FUNCTION TESTS: Recent Labs    10/17/19 1529 11/16/19 0815 11/22/19 1234  BILITOT 1.1 1.2 0.3  AST 16 18 12*  ALT 13 12 16   ALKPHOS 66 67 109  PROT 7.8 7.7 7.3  ALBUMIN 4.0 3.9 3.8    TUMOR MARKERS: No results for input(s): AFPTM, CEA,  CA199, CHROMGRNA in the last 8760 hours.  Assessment and Plan: Patient with past medical history of newly diagnosed breast cancer presents with complaint of liver lesion, concern for metastatic disease.  IR consulted for liver lesion biopsy at the request of Dr. Jana Hakim. Case reviewed by Dr. Kathlene Cote who approves patient for procedure.  Patient presents today in their usual state of health.  She has been NPO and is not currently on blood thinners.    Risks and benefits was discussed with the patient and/or patient's family including, but not limited to bleeding, infection, damage to adjacent structures or low yield requiring additional tests.  All of the questions were answered and there is agreement to proceed.  Consent signed and in chart.   Thank you for this interesting consult.  I greatly enjoyed meeting [name] M Krista [name] and look forward to participating in their care.  A copy of this report was sent to the requesting provider on this date.  Electronically Signed: Docia Barrier, PA 11/23/2019, 1:41 PM   I spent a total of  30 Minutes   in face to face in clinical consultation, greater than 50% of which was counseling/coordinating care for liver lesion.

## 2019-11-23 NOTE — Discharge Instructions (Signed)

## 2019-11-24 ENCOUNTER — Ambulatory Visit: Payer: BC Managed Care – PPO

## 2019-11-24 ENCOUNTER — Encounter: Payer: Self-pay | Admitting: *Deleted

## 2019-11-25 ENCOUNTER — Encounter: Payer: Self-pay | Admitting: Genetic Counselor

## 2019-11-25 ENCOUNTER — Telehealth: Payer: Self-pay | Admitting: Genetic Counselor

## 2019-11-25 DIAGNOSIS — Z1509 Genetic susceptibility to other malignant neoplasm: Secondary | ICD-10-CM | POA: Insufficient documentation

## 2019-11-25 DIAGNOSIS — Z1501 Genetic susceptibility to malignant neoplasm of breast: Secondary | ICD-10-CM | POA: Insufficient documentation

## 2019-11-25 DIAGNOSIS — Z5111 Encounter for antineoplastic chemotherapy: Secondary | ICD-10-CM | POA: Insufficient documentation

## 2019-11-25 LAB — HGB FRACTIONATION BY HPLC
Hgb A2: 3.7 % — ABNORMAL HIGH (ref 1.8–3.2)
Hgb A: 62.6 % — ABNORMAL LOW (ref 96.4–98.8)
Hgb C: 33.2 % — ABNORMAL HIGH
Hgb E: 0 %
Hgb F: 0.5 % (ref 0.0–2.0)
Hgb S: 0 %
Hgb Variant: 0 %

## 2019-11-25 LAB — HGB FRACTIONATION CASCADE

## 2019-11-25 LAB — SURGICAL PATHOLOGY

## 2019-11-25 NOTE — Telephone Encounter (Signed)
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 Pathogenic variants in the BRCA1 gene are associated with an increased risk for breast cancer, ovarian cancer, prostate cancer, and pancreatic cancer. This result explains why Ms. Krista [name] developed breast cancer, and likely explains some of the cancer in her family history.   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 These VUSes should not impact her medical management.  We have scheduled a follow-up virtual appointment on 12/05/19 at 3:00 pm to discuss these results in greater detail.

## 2019-11-28 NOTE — Progress Notes (Signed)
[facility]  [phone] [phone]     ID: Krista [name] DOB: [birth year]  MR#: [id number]  HOZ#:[id number]  Patient Care Team: Patient, No Pcp Per as PCP - General (General Practice) Donoven Pett, [name], MD as Consulting Physician (Oncology) Erroll Luna, MD as Consulting Physician (General Surgery) Eppie Gibson, MD as Attending Physician (Radiation Oncology) Rockwell Germany, RN as Oncology Nurse Navigator Mauro Kaufmann, RN as Oncology Nurse Navigator Chauncey Cruel, MD OTHER MD:  CHIEF COMPLAINT: Triple negative breast cancer  CURRENT TREATMENT: Neoadjuvant chemotherapy   INTERVAL HISTORY: Krista [name] returns today for follow up and treatment of her triple negative breast cancer accompanied by her husband [name].    She started on neoadjuvant chemotherapy, consisting of doxorubicin and cyclophosphamide, on 11/16/2019. Today is day 1 cycle 2.  She tolerated the first cycle generally well as detailed in the prior note  Since her last visit, she underwent liver biopsy on 11/23/2019. Pathology [phone]) was benign.  Her genetic testing results returned on 11/25/2019 and revealed a pathogenic variant in [place].   REVIEW OF SYSTEMS: Krista [name] is tolerating treatment well.  I tried to reach her by phone to give her the information regarding her recent test results but her phone line was full and not accepting any further messages.  Accordingly she was seen today in the infusion area.   HISTORY OF CURRENT ILLNESS: From the original intake note:  Krista [name] [name]"] herself palpated a lump in the upper-inner right breast sometime late December 2020 or early January 2021.  As the mass grew larger and persisted through menstrual periods she brought it to medical attention and underwent bilateral diagnostic mammography with tomography and bilateral breast ultrasonography at [facility] on 09/26/2019 showing:  breast density category C; 5.1 cm mass involving upper-inner quadrant of right breast; no evidence of right axillary lymphadenopathy or left breast malignancy.  Accordingly on 10/03/2019 she proceeded to biopsy of the right breast area in question. The pathology from this procedure ([id number]) showed: invasive ductal carcinoma, grade 3. Prognostic indicators significant for: estrogen receptor, 0% negative and progesterone receptor, 0% negative. Proliferation marker Ki67 at 80%. HER2 negative by immunohistochemistry (1+).  The patient's subsequent history is as detailed below.   PAST MEDICAL HISTORY: Past Medical History:  Diagnosis Date  . Anxiety   . Cancer ([place])   . Family history of breast cancer   . Family history of cervical cancer   . Family history of lung cancer   . Hemorrhoids     PAST SURGICAL HISTORY: No prior surgeries   FAMILY HISTORY: Family History  Problem Relation Age of Onset  . Breast cancer Mother 50       double mastectomy  . Breast cancer Half-Sister 69  . Cirrhosis Half-Sister   . Heart Problems Father   . Stroke Father   . Breast cancer Maternal Aunt 85  . Cancer Maternal Uncle 45       unknown type  . Cervical cancer Maternal Grandmother        dx. in her mid-40s  . Lung cancer Paternal Grandmother   . Cancer Other        unknown type; maternal great-uncles/aunts (x3)  . Diabetes Half-Brother   . Cancer Other        unknown type; matenral great-uncles/aunt (x4)  . Cancer Maternal Great-grandfather        unknown type  . Breast cancer Cousin 83  paternal cousin  The patient's father died at age 79 from cardiac complications of drug use.  His mother, the patient's paternal grandmother had what seems to have been cancer of the lung metastatic to the spine.  There was no other cancer on his side of the family to the patient's knowledge.  The patient's mother died at age 21 in the setting of multiple medical issues but she had undergone double  mastectomy at the age of 72.  Her mother, the patient's maternal grandmother had cervical cancer.  The patient's mother had 3 brothers and 3 sisters.  1 of those 3 sisters had breast cancer diagnosed at the age of 81.  The patient herself has 2 brothers and 3 sisters one of her sisters was diagnosed with breast cancer at the age of 46 and died at the age of 63   GYNECOLOGIC HISTORY:  Patient's last menstrual period was 11/23/2019 (exact date). Menarche: 44 years old Age at first live birth: 44 years old [place] P 3 LMP regular, last approximately 5 days, of which the second day is heavy Contraceptive: Husband status post vasectomy HRT no  Hysterectomy? no BSO?  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  Her husband [name] is a Research scientist (medical) and also Librarian, academic at [employer].  Their children are Rowe Pavy, 20, who is Scientist, product/process development at [employer], 18, who will study psychology at [employer] starting the fall 2021; and Alanna, 38, currently attending [employer].  The patient is not a church attender    ADVANCED DIRECTIVES: In the absence of any documents to the contrary the patient's husband is her healthcare power of attorney   HEALTH MAINTENANCE: Social History   Tobacco Use  . Smoking status: Never Smoker  . Smokeless tobacco: Never Used  Substance Use Topics  . Alcohol use: Not Currently    Comment: Social drinker  . Drug use: Never     Colonoscopy: n/a (age)  PAP: UTD  Bone density: n/a (age)   Allergies  Allergen Reactions  . Emend [Fosaprepitant] Shortness Of Breath    Current Outpatient Medications  Medication Sig Dispense Refill  . acetaminophen (TYLENOL) 500 MG tablet Take 1-2 tablets (500-1,000 mg total) by mouth every 6 (six) hours as needed. (Patient taking differently: Take 500-1,000 mg by mouth every 6 (six) hours as needed for moderate pain or fever. ) 60 tablet 0  . desloratadine  (CLARINEX) 5 MG tablet Take 5 mg by mouth daily.    Marland Kitchen dexamethasone (DECADRON) 4 MG tablet Take 2 tablets by mouth daily starting the day after Cytoxan x 3 days. Take with food. (Patient taking differently: Take 8 mg by mouth daily. ) 30 tablet 1  . lidocaine-prilocaine (EMLA) cream Apply to affected area once 30 g 3  . loratadine (CLARITIN) 10 MG tablet Take 1 tablet (10 mg total) by mouth daily. 60 tablet 0  . LORazepam (ATIVAN) 0.5 MG tablet Take 1 tablet (0.5 mg total) by mouth at bedtime as needed (Nausea or vomiting). 30 tablet 0  . Multiple Vitamins-Minerals (MULTIVITAMIN WITH MINERALS) tablet Take 2 tablets by mouth daily.    . prochlorperazine (COMPAZINE) 10 MG tablet Take 1 tablet (10 mg total) by mouth every 6 (six) hours as needed (Nausea or vomiting). 30 tablet 1   No current facility-administered medications for this visit.    OBJECTIVE: African-American woman evaluated in the infusion area  There were  no vitals filed for this visit.   There is no height or weight on file to calculate BMI.   Wt Readings from Last 3 Encounters:  11/23/19 212 lb (96.2 kg)  11/22/19 212 lb 1.6 oz (96.2 kg)  11/16/19 208 lb 14.4 oz (94.8 kg)  For vitals on 11/29/2019 please see the infusion follow-up    ECOG FS:1 - Symptomatic but completely ambulatory   LAB RESULTS:  CMP     Component Value Date/Time   NA 138 11/22/2019 1234   K 3.8 11/22/2019 1234   CL 105 11/22/2019 1234   CO2 25 11/22/2019 1234   GLUCOSE 173 (H) 11/22/2019 1234   BUN 13 11/22/2019 1234   CREATININE 0.78 11/22/2019 1234   CREATININE 0.95 10/17/2019 1529   CALCIUM 8.7 (L) 11/22/2019 1234   PROT 7.3 11/22/2019 1234   ALBUMIN 3.8 11/22/2019 1234   AST 12 (L) 11/22/2019 1234   AST 16 10/17/2019 1529   ALT 16 11/22/2019 1234   ALT 13 10/17/2019 1529   ALKPHOS 109 11/22/2019 1234   BILITOT 0.3 11/22/2019 1234   BILITOT 1.1 10/17/2019 1529   GFRNONAA >60 11/22/2019 1234   GFRNONAA >60 10/17/2019 1529   GFRAA >60  11/22/2019 1234   GFRAA >60 10/17/2019 1529    No results found for: TOTALPROTELP, ALBUMINELP, A1GS, A2GS, BETS, BETA2SER, GAMS, MSPIKE, SPEI  Lab Results  Component Value Date   WBC 23.9 (H) 11/22/2019   NEUTROABS 23.2 (H) 11/22/2019   HGB 11.1 (L) 11/22/2019   HCT 31.6 (L) 11/22/2019   MCV 71.2 (L) 11/22/2019   PLT 270 11/22/2019    No results found for: LABCA2  No components found for: JIRCVE938  Recent Labs  Lab 11/23/19 1245  INR 1.1    No results found for: LABCA2  No results found for: BOF751  No results found for: WCH852  No results found for: DPO242  Lab Results  Component Value Date   CA2729 29.8 11/16/2019    No components found for: HGQUANT  No results found for: CEA1 / No results found for: CEA1   No results found for: AFPTUMOR  No results found for: CHROMOGRNA  No results found for: KPAFRELGTCHN, LAMBDASER, KAPLAMBRATIO (kappa/lambda light chains)  No results found for: HGBA, HGBA2QUANT, HGBFQUANT, HGBSQUAN (Hemoglobinopathy evaluation)   No results found for: LDH  Lab Results  Component Value Date   IRON 99 10/17/2019   TIBC 391 10/17/2019   IRONPCTSAT 25 10/17/2019   (Iron and TIBC)  Lab Results  Component Value Date   FERRITIN 45 10/17/2019    Urinalysis No results found for: COLORURINE, APPEARANCEUR, LABSPEC, PHURINE, GLUCOSEU, HGBUR, BILIRUBINUR, KETONESUR, PROTEINUR, UROBILINOGEN, NITRITE, LEUKOCYTESUR   STUDIES: CT Chest W Contrast  Result Date: 10/31/2019 CLINICAL DATA:  Breast cancer staging, diagnosed 3 weeks ago. EXAM: CT CHEST, ABDOMEN, AND PELVIS WITH CONTRAST TECHNIQUE: Multidetector CT imaging of the chest, abdomen and pelvis was performed following the standard protocol during bolus administration of intravenous contrast. CONTRAST:  158m OMNIPAQUE IOHEXOL 300 MG/ML  SOLN COMPARISON:  None. FINDINGS: CT CHEST FINDINGS Cardiovascular: Heart is at the upper limits of normal in size to mildly enlarged. No pericardial  effusion. Mediastinum/Nodes: Heterogeneous lymph node in the right axilla measures 1.5 cm (2/19). No additional pathologically enlarged axillary, internal mammary, mediastinal or hilar lymph nodes. Esophagus is unremarkable. Lungs/Pleura: 2 mm subpleural posterior right upper lobe nodule (7/52). 1 mm inferior left lower lobe nodule (7/141). No pleural fluid. Airway is unremarkable. Musculoskeletal: Heterogeneous lobulated mass with  surrounding nodularity in the right breast measures at least 3.4 x 4.9 cm (2/13). There is a soft tissue nodule in the lateral left breast measuring 1.7 x 2.0 cm (2/22). No worrisome lytic or sclerotic lesions. CT ABDOMEN PELVIS FINDINGS Hepatobiliary: Ill-defined hypodense lesion in segment 4 of the liver measures 1.5 cm (2/59). Liver and gallbladder are otherwise unremarkable. No biliary ductal dilatation. Pancreas: Negative. Spleen: Negative. Adrenals/Urinary Tract: Adrenal glands and kidneys are unremarkable. Mild dilatation of the ureters bilaterally, left greater than right, possibly due to an enlarged uterus. 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 Bladder is grossly unremarkable. Stomach/Bowel: Stomach, small bowel, appendix and colon are unremarkable. Fair amount of stool in the colon is indicative of constipation. Vascular/Lymphatic: Vascular structures are unremarkable. No pathologically enlarged lymph nodes. Reproductive: Uterus is enlarged and heterogeneous, with a dominant mass measuring approximately 8.6 cm (6/104). Prominent nabothian cysts. No adnexal mass. Other: No free fluid.  Mesenteries and peritoneum are unremarkable. Musculoskeletal: No worrisome lytic or sclerotic lesions. IMPRESSION: 1. Large right breast mass with metastatic right axillary adenopathy. 2. Ill-defined low-attenuation lesion in segment 4 of the liver is worrisome for metastatic disease. Further evaluation with MR abdomen without and with  contrast could be performed, as clinically indicated. 3. Lateral left breast nodule. Patient recently underwent sonographic and MRI evaluation on 09/06/2019 and 10/27/2019, respectively. 4. Enlarged, heterogeneous and fibroid uterus which may cause mild bilateral ureteral dilatation, left greater than right. 5. Tiny subpleural pulmonary nodules are nonspecific and not felt to be metastatic. Continued attention on follow-up exams is suggested. Electronically Signed   By: Lorin Picket M.D.   On: 10/31/2019 11:45   NM Bone Scan Whole Body  Result Date: 10/31/2019 CLINICAL DATA:  RIGHT breast cancer, staging EXAM: NUCLEAR MEDICINE WHOLE BODY BONE SCAN TECHNIQUE: Whole body anterior and posterior images were obtained approximately 3 hours after intravenous injection of radiopharmaceutical. RADIOPHARMACEUTICALS:  21 mCi Technetium-46mMDP IV COMPARISON:  None Correlation: CT chest abdomen pelvis 10/31/2019 FINDINGS: Symmetric calvarial uptake, nonspecific. Uptake at the shoulders, sternoclavicular joints, hips, and knees, typically degenerative. No definite sites of abnormal osseous tracer uptake are seen to suggest osseous metastatic disease. Small amount retained tracer within LEFT renal collecting system, corresponding to mildly dilated duplicated collecting system/proximal ureters seen on CT. Remaining urinary tract and soft tissue distribution of tracer unremarkable. IMPRESSION: No scintigraphic evidence of osseous metastatic disease. Electronically Signed   By: MLavonia DanaM.D.   On: 10/31/2019 16:15   CT Abdomen Pelvis W Contrast  Result Date: 10/31/2019 CLINICAL DATA:  Breast cancer staging, diagnosed 3 weeks ago. EXAM: CT CHEST, ABDOMEN, AND PELVIS WITH CONTRAST TECHNIQUE: Multidetector CT imaging of the chest, abdomen and pelvis was performed following the standard protocol during bolus administration of intravenous contrast. CONTRAST:  1061mOMNIPAQUE IOHEXOL 300 MG/ML  SOLN COMPARISON:  None.  FINDINGS: CT CHEST FINDINGS Cardiovascular: Heart is at the upper limits of normal in size to mildly enlarged. No pericardial effusion. Mediastinum/Nodes: Heterogeneous lymph node in the right axilla measures 1.5 cm (2/19). No additional pathologically enlarged axillary, internal mammary, mediastinal or hilar lymph nodes. Esophagus is unremarkable. Lungs/Pleura: 2 mm subpleural posterior right upper lobe nodule (7/52). 1 mm inferior left lower lobe nodule (7/141). No pleural fluid. Airway is unremarkable. Musculoskeletal: Heterogeneous lobulated mass with surrounding nodularity in the right breast measures at least 3.4 x 4.9 cm (2/13). There is a soft tissue nodule in the lateral left breast measuring 1.7 x  2.0 cm (2/22). No worrisome lytic or sclerotic lesions. CT ABDOMEN PELVIS FINDINGS Hepatobiliary: Ill-defined hypodense lesion in segment 4 of the liver measures 1.5 cm (2/59). Liver and gallbladder are otherwise unremarkable. No biliary ductal dilatation. Pancreas: Negative. Spleen: Negative. Adrenals/Urinary Tract: Adrenal glands and kidneys are unremarkable. Mild dilatation of the ureters bilaterally, left greater than right, possibly due to an enlarged uterus. 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 Bladder is grossly unremarkable. Stomach/Bowel: Stomach, small bowel, appendix and colon are unremarkable. Fair amount of stool in the colon is indicative of constipation. Vascular/Lymphatic: Vascular structures are unremarkable. No pathologically enlarged lymph nodes. Reproductive: Uterus is enlarged and heterogeneous, with a dominant mass measuring approximately 8.6 cm (6/104). Prominent nabothian cysts. No adnexal mass. Other: No free fluid.  Mesenteries and peritoneum are unremarkable. Musculoskeletal: No worrisome lytic or sclerotic lesions. IMPRESSION: 1. Large right breast mass with metastatic right axillary adenopathy. 2. Ill-defined low-attenuation  lesion in segment 4 of the liver is worrisome for metastatic disease. Further evaluation with MR abdomen without and with contrast could be performed, as clinically indicated. 3. Lateral left breast nodule. Patient recently underwent sonographic and MRI evaluation on 09/06/2019 and 10/27/2019, respectively. 4. Enlarged, heterogeneous and fibroid uterus which may cause mild bilateral ureteral dilatation, left greater than right. 5. Tiny subpleural pulmonary nodules are nonspecific and not felt to be metastatic. Continued attention on follow-up exams is suggested. Electronically Signed   By: Lorin Picket M.D.   On: 10/31/2019 11:45   MR LIVER W WO CONTRAST  Result Date: 11/12/2019 CLINICAL DATA:  Breast cancer staging. EXAM: MRI ABDOMEN WITHOUT AND WITH CONTRAST TECHNIQUE: Multiplanar multisequence MR imaging of the abdomen was performed both before and after the administration of intravenous contrast. CONTRAST:  25m GADAVIST GADOBUTROL 1 MMOL/ML IV SOLN COMPARISON:  Chest abdomen and pelvis 10/31/2019 FINDINGS: Lower chest: Incidental imaging of the lung bases is unremarkable, limited assessment on MRI. Hepatobiliary: Targetoid lesion in the medial segment of the LEFT hepatic lobe (image 17, series 5 and image 58 of series 8 displays restricted diffusion. Lesion displays peripheral enhancement, central necrosis and peripheral washout. Additional tiny focus of diffusion related signal, see below. Pancreas: Pancreas without sign of inflammation, focal lesion or ductal dilation. Spleen:  Spleen normal size without focal lesion. Adrenals/Urinary Tract: Adrenal glands are normal. Smooth renal contours. Stomach/Bowel: Stomach is distended with ingested contents. No acute gastrointestinal process to the extent evaluated, study not protocol for bowel evaluation. Normal appendix. Vascular/Lymphatic: Vascular structures in the abdomen are patent. No adenopathy. Other: Large myomatous uterus partially visualized in the  pelvis. No ascites. Small fat containing umbilical hernia. Musculoskeletal: No acute musculoskeletal process. IMPRESSION: 1. Targetoid lesion in the medial segment of the LEFT hepatic lobe displays restricted diffusion with target like enhancement with features of metastatic disease. Tissue sampling may be helpful. 2. Subtle focus of diffusion related signal along a venous branch in the lateral segment LEFT hepatic lobe just above the inter lobar fissure (series 8, image 11). No corresponding abnormality on contrasted imaging. This could represent slow flow in a venous branch in plane with the scan or a small focus of disease. Close attention on follow-up is suggested. 3. No other evidence of metastatic disease within the abdomen. 4. 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 Electronically Signed   By: GZetta BillsM.D.   On: 11/12/2019 15:31   [place]BIOPSY (LIVER)  Result Date: 11/23/2019 INDICATION: Right-sided breast cancer and solitary  lesion in the left lobe of the liver potentially representing metastatic disease. The patient presents for biopsy. EXAM: ULTRASOUND GUIDED CORE BIOPSY OF LIVER MASS MEDICATIONS: None. ANESTHESIA/SEDATION: Fentanyl 100 mcg IV; Versed 2.0 mg IV Moderate Sedation Time:  16 minutes. The patient was continuously monitored during the procedure by the interventional radiology nurse under my direct supervision. PROCEDURE: The procedure, risks, benefits, and alternatives were explained to the patient. Questions regarding the procedure were encouraged and answered. The patient understands and consents to the procedure. A time-out was performed prior to initiating the procedure. Ultrasound was performed to isolate a lesion within the left lobe of the liver. The abdominal wall was prepped with chlorhexidine in a sterile fashion, and a sterile drape was applied covering the operative field. A sterile gown and sterile gloves were used for the procedure. Local anesthesia was  provided with 1% Lidocaine. Under ultrasound guidance, a 17 gauge needle was advanced to the level of the left lobe of the liver. Two separate coaxial 18 gauge core biopsy samples were obtained through a lesion in the left lobe. Samples were submitted in formalin. Gel-Foam pledgets were advanced through the outer needle as the outer needle was removed. Additional ultrasound was performed. COMPLICATIONS: None immediate. FINDINGS: Rounded hypoechoic solid mass within the left lobe of the liver adjacent to the falciform ligament corresponds to the lesion seen by MRI. This measures approximately 1.9 x 1.7 x 1.9 cm by ultrasound. Solid tissue was obtained. IMPRESSION: Ultrasound-guided core biopsy performed of a 1.9 cm lesion within the left lobe of the liver. Electronically Signed   By: Aletta Edouard M.D.   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 This was found to be concordant by Dr. Kristopher Oppenheim. Pathology results were discussed with the patient by telephone. The patient reported doing well after the biopsy with tenderness at the site. Post biopsy instructions and care were reviewed and questions were answered. The patient was encouraged to call [facility] for any additional concerns. The patient has a recent diagnosis of Right breast cancer and should follow her outlined treatment plan. Bary Castilla, RN Nurse Navigator, with [facility] was notified of biopsy results via EPIC message on Nov 10, 2019. Pathology results reported by Terie Purser, RN on 11/10/2019. Electronically Signed   By: Kristopher Oppenheim M.D.   On: 11/10/2019 14:43   Result Date: 11/21/2019 CLINICAL DATA:  44 year old female with recently diagnosed right breast cancer presents for biopsy of an enlarged axillary lymph node. EXAM: [place] AXILLARY  NODE CORE BIOPSY RIGHT COMPARISON:  Previous exam(s). PROCEDURE: I met with the patient and we discussed the procedure of ultrasound-guided biopsy, including benefits and alternatives. We discussed the high likelihood of a successful procedure. We discussed the risks of the procedure, including infection, bleeding, tissue injury, clip migration, and inadequate sampling. Informed written consent was given. The usual time-out protocol was performed immediately prior to the procedure. Using sterile technique and 1% Lidocaine as local anesthetic, under direct ultrasound visualization, a 14 gauge spring-loaded device was used to perform biopsy of a low lying right axillary lymph node using a lateral approach. At the conclusion of the procedure a post biopsy tissue marker clip was deployed into the biopsy cavity. Follow up 2 view mammogram was deferred. IMPRESSION: Ultrasound guided biopsy of a right axillary lymph node. No  apparent complications. Electronically Signed: By: Kristopher Oppenheim M.D. On: 11/09/2019 15:43   [place] AXILLA RIGHT  Result Date: 11/09/2019 CLINICAL DATA:  Patient presents for second-look evaluation of a prominent right axillary lymph node seen on recent staging MRI. EXAM: ULTRASOUND OF THE RIGHT AXILLA COMPARISON:  Previous exam(s). FINDINGS: Targeted ultrasound is performed, showing a single morphologically abnormal low lying right axillary lymph node. The node measures up to 2 cm with 1.1 cm of diffuse cortical thickening. IMPRESSION: Suspicious low lying right axillary lymph node corresponding with recent MRI findings. RECOMMENDATION: Recommendation is for ultrasound-guided biopsy of above findings. This was performed to follow. Please see additional dictation. I have discussed the findings and recommendations with the patient. If applicable, a reminder letter will be sent to the patient regarding the next appointment. BI-RADS CATEGORY  4: Suspicious. Electronically Signed   By: Kristopher Oppenheim M.D.    On: 11/09/2019 15:44   IR IMAGING GUIDED PORT INSERTION  Result Date: 11/07/2019 INDICATION: 44 year old with right breast cancer. Port-A-Cath needed for chemotherapy. EXAM: FLUOROSCOPIC AND ULTRASOUND GUIDED PLACEMENT OF A SUBCUTANEOUS PORT. Physician: Stephan Minister. Henn, MD MEDICATIONS: Ancef 2 g, antibiotics were given within 1 hour of the procedure. ANESTHESIA/SEDATION: Versed 4.0 mg IV; Fentanyl 100 mcg IV; Moderate Sedation Time:  49 minutes The patient was continuously monitored during the procedure by the interventional radiology nurse under my direct supervision. FLUOROSCOPY TIME:  2 minutes, 12 seconds; 17 mGy COMPLICATIONS: None immediate. PROCEDURE: The procedure was explained to the patient. The risks and benefits of the procedure were discussed and the patient's questions were addressed. Informed consent was obtained from the patient. Patient was placed supine on the interventional table. Ultrasound confirmed a patent left internal jugular vein. The left chest and neck were cleaned with a skin antiseptic and a sterile drape was placed. Maximal barrier sterile technique was utilized including caps, mask, sterile gowns, sterile gloves, sterile drape, hand hygiene and skin antiseptic. The left neck was anesthetized with 1% lidocaine. Small incision was made in the left neck with a blade. Micropuncture set was placed in the left IJ with ultrasound guidance. The micropuncture wire was used for measurement purposes. The left chest was anesthetized with 1% lidocaine with epinephrine. #15 blade was used to make an incision and a subcutaneous port pocket was formed. [place] was assembled. Subcutaneous tunnel was formed with a stiff tunneling device. The port catheter was brought through the subcutaneous tunnel. The port was placed in the subcutaneous pocket. The micropuncture set was exchanged for a peel-away sheath. The catheter was placed through the peel-away sheath and the tip was noted to be in  the upper SVC. This placement was not felt to be adequate for long-term catheter placement. Unable to remove the catheter through the vein dermatotomy site. Therefore, the entire catheter was removed. Using ultrasound guidance, a 21 gauge needle was directed into the left internal jugular vein and wire was advanced centrally. Another micropuncture dilator set was placed. A new 8 French port catheter was tunneled through the subcutaneous tract to the vein dermatotomy site. The micropuncture catheter was exchanged for a peel-away sheath over a J wire. Catheter was advanced through the peel-away sheath and directed into the central venous system. Catheter was pulled back into the upper right atrium region. Catheter was cut to an appropriate length and connected to the port device. Port device was placed in the subcutaneous port pocket. Port was accessed. Port aspirated and flushed well. The port pocket was closed  using two layers of absorbable sutures and Dermabond. The vein skin site was closed using a single layer of absorbable suture and Dermabond. Access needle was kept in place after the procedure. Sterile dressings were applied. Patient tolerated the procedure well without an immediate complication. Ultrasound and fluoroscopic images were taken and saved for this procedure. FINDINGS: Patent left internal jugular vein. Initially, the Port-A-Cath tip was in the upper SVC/innominate junction and not adequate for long-term use. Therefore, a new catheter was placed and the tip was placed in the upper right atrium near the SVC and right atrium junction. IMPRESSION: Placement of a subcutaneous port device. Electronically Signed   By: Markus Daft M.D.   On: 11/07/2019 14:47     ELIGIBLE FOR AVAILABLE RESEARCH PROTOCOL: [id number]  ASSESSMENT: 44 y.o. [place] woman status post right breast upper inner quadrant biopsy 10/03/2019 for a clinical mT3 N1, stage IIIC invasive ductal carcinoma, grade 3, triple negative, with  an MIB-1 of 80%  (a) biopsy of a right axillary node 2019-11-09 showed carcinoma  (b) CT chest/abd/pelvis 10/31/2019 shows 1.5 cm hypodense lesion in liver, lung nodules <0.3 cm  (c) bone scan 10/31/2019 negative  (d) liver MRI 11/11/2019 confirms a targetoid lesion in the left hepatic lobe suspicious for metastasis   (i) liver biopsy 2019-11-23 showed no evidence of malignancy (benign).  (e) baseline CA 27-29 on 11/16/2019 normal at 29.8  (1) genetics testing 2019-11-01 through the the [facility] Multi-Cancer panel found a pathogenic variant in the BRCA1 gene called c.[id number] (p.Thr276Alafs*14)   (a) two variants of uncertain significance were noted - one in the [facility] gene called c.1295T>G and one in the POLD1 gene called c.34G>A  (b) no additional mutations of concern were found in AIP, ALK, APC, ATM, AXIN2,BAP1,  BARD1, BLM, BMPR1A, BRCA1, BRCA2, BRIP1, CASR, CDC73, CDH1, CDK4, CDKN1B, CDKN1C, CDKN2A (p14ARF), CDKN2A (p16INK4a), CEBPA, CHEK2, CTNNA1, DICER1, DIS3L2, EGFR (c.2369C>T, p.Thr790Met variant only), EPCAM (Deletion/duplication testing only), FH, FLCN, GATA2, GPC3, GREM1 (Promoter region deletion/duplication testing only), HOXB13 (c.251G>A, p.Gly84Glu), HRAS, KIT, MAX, MEN1, MET, MITF (c.952G>A, p.Glu318Lys variant only), MLH1, MSH2, MSH3, MSH6, MUTYH, NBN, NF1, NF2, NTHL1, PALB2, PDGFRA, PHOX2B, PMS2, POLD1, POLE, POT1, PRKAR1A, PTCH1, PTEN, RAD50, RAD51C, RAD51D, RB1, RECQL4, RET, RNF43, RUNX1, SDHAF2, SDHA (sequence changes only), SDHB, SDHC, SDHD, SMAD4, SMARCA4, SMARCB1, SMARCE1, STK11, SUFU, TERC, TERT, TMEM127, TP53, TSC1, TSC2, VHL, WRN and WT1.  (2) neoadjuvant chemotherapy consisting of cyclophosphamide and doxorubicin in dose dense fashion x4 started 2019-11-16, to be followed by paclitaxel and carboplatin weekly x12  (a) echo 10/24/2019 shows an ejection fraction in the 60-65% range  (3) definitive surgery to follow  (4) adjuvant radiation  (5) hemoglobin C trait:  (a)  ferritin 10/17/2019 was 45 with saturation 25%, hemoglobin 11.8 and MCV 71.5  (b) hemoglobin electrophoresis 2019-11-22 showed: A: 62.6%, A2: 3.7%, C: 33.2%, F:0 5.1%, S: 0%   PLAN: I gave Krista [name] the results of her liver biopsy which are favorable.  She does not have metastatic disease.  We are continuing curative treatment as planned  I also gave her the results of the genetics test which show a BRCA mutation.  She will consider bilateral mastectomies versus intensified screening.  She understands that she will need to have her ovaries removed at some point.  She was reluctant to discuss this further today this being "more than she can handle" on the same day as she is getting chemotherapy.  She returns to see me in 7 days and we will follow-up at  this point.  I would favor having the bilateral salpingo-oophorectomy at the same time as what ever breast surgery she chooses  Total encounter time 20 minutes.Sarajane Jews C. Reshonda Koerber, MD 11/29/2019 8:42 AM Medical Oncology and Hematology [facility] [place], [place] [address] Tel. [phone]    Fax. [phone]   This document serves as a record of services personally performed by Lurline Del, MD. It was created on his behalf by Wilburn Mylar, a trained medical scribe. The creation of this record is based on the scribe's personal observations and the provider's statements to them.   I, Lurline Del MD, have reviewed the above documentation for accuracy and completeness, and I agree with the above.   *Total Encounter Time as defined by the Centers for Medicare and Medicaid Services includes, in addition to the face-to-face time of a patient visit (documented in the note above) non-face-to-face time: obtaining and reviewing outside history, ordering and reviewing medications, tests or procedures, care coordination (communications with other health care professionals or caregivers) and documentation in the  medical record.

## 2019-11-29 ENCOUNTER — Encounter: Payer: Self-pay | Admitting: Oncology

## 2019-11-29 ENCOUNTER — Inpatient Hospital Stay (HOSPITAL_BASED_OUTPATIENT_CLINIC_OR_DEPARTMENT_OTHER): Payer: BC Managed Care – PPO | Admitting: Oncology

## 2019-11-29 ENCOUNTER — Inpatient Hospital Stay: Payer: BC Managed Care – PPO | Attending: Oncology

## 2019-11-29 ENCOUNTER — Inpatient Hospital Stay: Payer: BC Managed Care – PPO

## 2019-11-29 ENCOUNTER — Other Ambulatory Visit: Payer: Self-pay

## 2019-11-29 ENCOUNTER — Encounter: Payer: Self-pay | Admitting: *Deleted

## 2019-11-29 VITALS — BP 150/110 | HR 89 | Temp 98.5°F | Resp 18

## 2019-11-29 DIAGNOSIS — R918 Other nonspecific abnormal finding of lung field: Secondary | ICD-10-CM | POA: Insufficient documentation

## 2019-11-29 DIAGNOSIS — Z171 Estrogen receptor negative status [ER-]: Secondary | ICD-10-CM | POA: Insufficient documentation

## 2019-11-29 DIAGNOSIS — Z8049 Family history of malignant neoplasm of other genital organs: Secondary | ICD-10-CM | POA: Insufficient documentation

## 2019-11-29 DIAGNOSIS — C50411 Malignant neoplasm of upper-outer quadrant of right female breast: Secondary | ICD-10-CM

## 2019-11-29 DIAGNOSIS — K769 Liver disease, unspecified: Secondary | ICD-10-CM | POA: Diagnosis not present

## 2019-11-29 DIAGNOSIS — C50211 Malignant neoplasm of upper-inner quadrant of right female breast: Secondary | ICD-10-CM | POA: Diagnosis present

## 2019-11-29 DIAGNOSIS — Z5111 Encounter for antineoplastic chemotherapy: Secondary | ICD-10-CM | POA: Insufficient documentation

## 2019-11-29 DIAGNOSIS — C50911 Malignant neoplasm of unspecified site of right female breast: Secondary | ICD-10-CM | POA: Insufficient documentation

## 2019-11-29 DIAGNOSIS — Z803 Family history of malignant neoplasm of breast: Secondary | ICD-10-CM | POA: Diagnosis not present

## 2019-11-29 DIAGNOSIS — Z801 Family history of malignant neoplasm of trachea, bronchus and lung: Secondary | ICD-10-CM | POA: Insufficient documentation

## 2019-11-29 DIAGNOSIS — D563 Thalassemia minor: Secondary | ICD-10-CM

## 2019-11-29 DIAGNOSIS — Z5189 Encounter for other specified aftercare: Secondary | ICD-10-CM | POA: Insufficient documentation

## 2019-11-29 DIAGNOSIS — Z809 Family history of malignant neoplasm, unspecified: Secondary | ICD-10-CM | POA: Insufficient documentation

## 2019-11-29 DIAGNOSIS — Z7189 Other specified counseling: Secondary | ICD-10-CM

## 2019-11-29 DIAGNOSIS — Z95828 Presence of other vascular implants and grafts: Secondary | ICD-10-CM

## 2019-11-29 DIAGNOSIS — Z1501 Genetic susceptibility to malignant neoplasm of breast: Secondary | ICD-10-CM

## 2019-11-29 DIAGNOSIS — D582 Other hemoglobinopathies: Secondary | ICD-10-CM | POA: Insufficient documentation

## 2019-11-29 LAB — CBC WITH DIFFERENTIAL/PLATELET
Abs Immature Granulocytes: 0.36 10*3/uL — ABNORMAL HIGH (ref 0.00–0.07)
Basophils Absolute: 0.1 10*3/uL (ref 0.0–0.1)
Basophils Relative: 1 %
Eosinophils Absolute: 0 10*3/uL (ref 0.0–0.5)
Eosinophils Relative: 0 %
HCT: 32 % — ABNORMAL LOW (ref 36.0–46.0)
Hemoglobin: 11.2 g/dL — ABNORMAL LOW (ref 12.0–15.0)
Immature Granulocytes: 5 %
Lymphocytes Relative: 22 %
Lymphs Abs: 1.6 10*3/uL (ref 0.7–4.0)
MCH: 24.9 pg — ABNORMAL LOW (ref 26.0–34.0)
MCHC: 35 g/dL (ref 30.0–36.0)
MCV: 71.3 fL — ABNORMAL LOW (ref 80.0–100.0)
Monocytes Absolute: 0.7 10*3/uL (ref 0.1–1.0)
Monocytes Relative: 9 %
Neutro Abs: 4.7 10*3/uL (ref 1.7–7.7)
Neutrophils Relative %: 63 %
Platelets: 209 10*3/uL (ref 150–400)
RBC: 4.49 MIL/uL (ref 3.87–5.11)
RDW: 16.4 % — ABNORMAL HIGH (ref 11.5–15.5)
WBC: 7.4 10*3/uL (ref 4.0–10.5)
nRBC: 0 % (ref 0.0–0.2)

## 2019-11-29 LAB — COMPREHENSIVE METABOLIC PANEL
ALT: 14 U/L (ref 0–44)
AST: 13 U/L — ABNORMAL LOW (ref 15–41)
Albumin: 3.6 g/dL (ref 3.5–5.0)
Alkaline Phosphatase: 95 U/L (ref 38–126)
Anion gap: 9 (ref 5–15)
BUN: 10 mg/dL (ref 6–20)
CO2: 24 mmol/L (ref 22–32)
Calcium: 9.2 mg/dL (ref 8.9–10.3)
Chloride: 107 mmol/L (ref 98–111)
Creatinine, Ser: 0.81 mg/dL (ref 0.44–1.00)
GFR calc Af Amer: >60 mL/min (ref >60)
GFR calc non Af Amer: >60 mL/min (ref >60)
Glucose, Bld: 116 mg/dL — ABNORMAL HIGH (ref 70–99)
Potassium: 3.9 mmol/L (ref 3.5–5.1)
Sodium: 140 mmol/L (ref 135–145)
Total Bilirubin: 0.4 mg/dL (ref 0.3–1.2)
Total Protein: 6.8 g/dL (ref 6.5–8.1)

## 2019-11-29 MED ORDER — SODIUM CHLORIDE 0.9% FLUSH
10.0000 mL | INTRAVENOUS | Status: DC | PRN
  Filled 2019-11-29: qty 10

## 2019-11-29 MED ORDER — SODIUM CHLORIDE 0.9 % IV SOLN
10.0000 mg | Freq: Once | INTRAVENOUS | Status: AC
  Administered 2019-11-29: 10 mg via INTRAVENOUS
  Filled 2019-11-29: qty 10

## 2019-11-29 MED ORDER — LORAZEPAM 2 MG/ML IJ SOLN
INTRAMUSCULAR | Status: AC
  Filled 2019-11-29: qty 1

## 2019-11-29 MED ORDER — LORAZEPAM 2 MG/ML IJ SOLN
0.5000 mg | Freq: Once | INTRAMUSCULAR | Status: AC
  Administered 2019-11-29: 0.5 mg via INTRAVENOUS

## 2019-11-29 MED ORDER — HEPARIN SOD (PORK) LOCK FLUSH 100 UNIT/ML IV SOLN
500.0000 [IU] | Freq: Once | INTRAVENOUS | Status: AC | PRN
  Administered 2019-11-29: 500 [IU]
  Filled 2019-11-29: qty 5

## 2019-11-29 MED ORDER — DOXORUBICIN HCL CHEMO IV INJECTION 2 MG/ML
60.0000 mg/m2 | Freq: Once | INTRAVENOUS | Status: AC
  Administered 2019-11-29: 120 mg via INTRAVENOUS
  Filled 2019-11-29: qty 60

## 2019-11-29 MED ORDER — PROCHLORPERAZINE MALEATE 10 MG PO TABS
10.0000 mg | ORAL_TABLET | Freq: Once | ORAL | Status: AC
  Administered 2019-11-29: 10 mg via ORAL

## 2019-11-29 MED ORDER — PROCHLORPERAZINE MALEATE 10 MG PO TABS
ORAL_TABLET | ORAL | Status: AC
  Filled 2019-11-29: qty 1

## 2019-11-29 MED ORDER — SODIUM CHLORIDE 0.9 % IV SOLN
600.0000 mg/m2 | Freq: Once | INTRAVENOUS | Status: AC
  Administered 2019-11-29: 1200 mg via INTRAVENOUS
  Filled 2019-11-29: qty 60

## 2019-11-29 MED ORDER — PALONOSETRON HCL INJECTION 0.25 MG/5ML
0.2500 mg | Freq: Once | INTRAVENOUS | Status: AC
  Administered 2019-11-29: 0.25 mg via INTRAVENOUS

## 2019-11-29 MED ORDER — SODIUM CHLORIDE 0.9 % IV SOLN
Freq: Once | INTRAVENOUS | Status: AC
  Filled 2019-11-29: qty 250

## 2019-11-29 MED ORDER — PALONOSETRON HCL INJECTION 0.25 MG/5ML
INTRAVENOUS | Status: AC
  Filled 2019-11-29: qty 5

## 2019-11-29 MED ORDER — SODIUM CHLORIDE 0.9% FLUSH
10.0000 mL | INTRAVENOUS | Status: DC | PRN
  Administered 2019-11-29: 10 mL
  Filled 2019-11-29: qty 10

## 2019-11-29 NOTE — Patient Instructions (Signed)
[facility] Discharge Instructions for Patients Receiving Chemotherapy  Today you received the following chemotherapy agents Adriamycin and Cytoxan  To help prevent nausea and vomiting after your treatment, we encourage you to take your nausea medication as directed.  If you develop nausea and vomiting that is not controlled by your nausea medication, call the clinic.   BELOW ARE SYMPTOMS THAT SHOULD BE REPORTED IMMEDIATELY:  *FEVER GREATER THAN 100.5 F  *CHILLS WITH OR WITHOUT FEVER  NAUSEA AND VOMITING THAT IS NOT CONTROLLED WITH YOUR NAUSEA MEDICATION  *UNUSUAL SHORTNESS OF BREATH  *UNUSUAL BRUISING OR BLEEDING  TENDERNESS IN MOUTH AND THROAT WITH OR WITHOUT PRESENCE OF ULCERS  *URINARY PROBLEMS  *BOWEL PROBLEMS  UNUSUAL RASH Items with * indicate a potential emergency and should be followed up as soon as possible.  Feel free to call the clinic should you have any questions or concerns. The clinic phone number is [phone].  Please show the CHEMO ALERT CARD at check-in to the Emergency Department and triage nurse.  Doxorubicin injection What is this medicine? DOXORUBICIN (dox oh ROO bi sin) is a chemotherapy drug. It is used to treat many kinds of cancer like leukemia, lymphoma, neuroblastoma, sarcoma, and Wilms' tumor. It is also used to treat bladder cancer, breast cancer, lung cancer, ovarian cancer, stomach cancer, and thyroid cancer. This medicine may be used for other purposes; ask your health care provider or pharmacist if you have questions. COMMON BRAND NAME(S): Adriamycin, Adriamycin PFS, Adriamycin RDF, Rubex What should I tell my health care provider before I take this medicine? They need to know if you have any of these conditions:  heart disease  history of low blood counts caused by a medicine  liver disease  recent or ongoing radiation therapy  an unusual or allergic reaction to doxorubicin, other chemotherapy agents, other  medicines, foods, dyes, or preservatives  pregnant or trying to get pregnant  breast-feeding How should I use this medicine? This drug is given as an infusion into a vein. It is administered in a hospital or clinic by a specially trained health care professional. If you have pain, swelling, burning or any unusual feeling around the site of your injection, tell your health care professional right away. Talk to your pediatrician regarding the use of this medicine in children. Special care may be needed. Overdosage: If you think you have taken too much of this medicine contact a poison control center or emergency room at once. NOTE: This medicine is only for you. Do not share this medicine with others. What if I miss a dose? It is important not to miss your dose. Call your doctor or health care professional if you are unable to keep an appointment. What may interact with this medicine? This medicine may interact with the following medications:  6-mercaptopurine  paclitaxel  phenytoin  St. John's Wort  trastuzumab  verapamil This list may not describe all possible interactions. Give your health care provider a list of all the medicines, herbs, non-prescription drugs, or dietary supplements you use. Also tell them if you smoke, drink alcohol, or use illegal drugs. Some items may interact with your medicine. What should I watch for while using this medicine? This drug may make you feel generally unwell. This is not uncommon, as chemotherapy can affect healthy cells as well as cancer cells. Report any side effects. Continue your course of treatment even though you feel ill unless your doctor tells you to stop. There is a maximum amount of   this medicine you should receive throughout your life. The amount depends on the medical condition being treated and your overall health. Your doctor will watch how much of this medicine you receive in your lifetime. Tell your doctor if you have taken this  medicine before. You may need blood work done while you are taking this medicine. Your urine may turn red for a few days after your dose. This is not blood. If your urine is dark or brown, call your doctor. In some cases, you may be given additional medicines to help with side effects. Follow all directions for their use. Call your doctor or health care professional for advice if you get a fever, chills or sore throat, or other symptoms of a cold or flu. Do not treat yourself. This drug decreases your body's ability to fight infections. Try to avoid being around people who are sick. This medicine may increase your risk to bruise or bleed. Call your doctor or health care professional if you notice any unusual bleeding. Talk to your doctor about your risk of cancer. You may be more at risk for certain types of cancers if you take this medicine. Do not become pregnant while taking this medicine or for 6 months after stopping it. Women should inform their doctor if they wish to become pregnant or think they might be pregnant. Men should not father a child while taking this medicine and for 6 months after stopping it. There is a potential for serious side effects to an unborn child. Talk to your health care professional or pharmacist for more information. Do not breast-feed an infant while taking this medicine. This medicine has caused ovarian failure in some women and reduced sperm counts in some men This medicine may interfere with the ability to have a child. Talk with your doctor or health care professional if you are concerned about your fertility. This medicine may cause a decrease in Co-Enzyme Q-10. You should make sure that you get enough Co-Enzyme Q-10 while you are taking this medicine. Discuss the foods you eat and the vitamins you take with your health care professional. What side effects may I notice from receiving this medicine? Side effects that you should report to your doctor or health care  professional as soon as possible:  allergic reactions like skin rash, itching or hives, swelling of the face, lips, or tongue  breathing problems  chest pain  fast or irregular heartbeat  low blood counts - this medicine may decrease the number of white blood cells, red blood cells and platelets. You may be at increased risk for infections and bleeding.  pain, redness, or irritation at site where injected  signs of infection - fever or chills, cough, sore throat, pain or difficulty passing urine  signs of decreased platelets or bleeding - bruising, pinpoint red spots on the skin, black, tarry stools, blood in the urine  swelling of the ankles, feet, hands  tiredness  weakness Side effects that usually do not require medical attention (report to your doctor or health care professional if they continue or are bothersome):  diarrhea  hair loss  mouth sores  nail discoloration or damage  nausea  red colored urine  vomiting This list may not describe all possible side effects. Call your doctor for medical advice about side effects. You may report side effects to FDA at 1-800-FDA-1088. Where should I keep my medicine? This drug is given in a hospital or clinic and will not be stored at home. NOTE:   This sheet is a summary. It may not cover all possible information. If you have questions about this medicine, talk to your doctor, pharmacist, or health care provider.  2020 Elsevier/Gold Standard (2017-01-28 11:01:26)  Cyclophosphamide Injection What is this medicine? CYCLOPHOSPHAMIDE (sye kloe FOSS fa mide) is a chemotherapy drug. It slows the growth of cancer cells. This medicine is used to treat many types of cancer like lymphoma, myeloma, leukemia, breast cancer, and ovarian cancer, to name a few. This medicine may be used for other purposes; ask your health care provider or pharmacist if you have questions. COMMON BRAND NAME(S): Cytoxan, Neosar What should I tell my health  care provider before I take this medicine? They need to know if you have any of these conditions:  heart disease  history of irregular heartbeat  infection  kidney disease  liver disease  low blood counts, like white cells, platelets, or red blood cells  on hemodialysis  recent or ongoing radiation therapy  scarring or thickening of the lungs  trouble passing urine  an unusual or allergic reaction to cyclophosphamide, other medicines, foods, dyes, or preservatives  pregnant or trying to get pregnant  breast-feeding How should I use this medicine? This drug is usually given as an injection into a vein or muscle or by infusion into a vein. It is administered in a hospital or clinic by a specially trained health care professional. Talk to your pediatrician regarding the use of this medicine in children. Special care may be needed. Overdosage: If you think you have taken too much of this medicine contact a poison control center or emergency room at once. NOTE: This medicine is only for you. Do not share this medicine with others. What if I miss a dose? It is important not to miss your dose. Call your doctor or health care professional if you are unable to keep an appointment. What may interact with this medicine?  amphotericin B  azathioprine  certain antivirals for HIV or hepatitis  certain medicines for blood pressure, heart disease, irregular heart beat  certain medicines that treat or prevent blood clots like warfarin  certain other medicines for cancer  cyclosporine  etanercept  indomethacin  medicines that relax muscles for surgery  medicines to increase blood counts  metronidazole This list may not describe all possible interactions. Give your health care provider a list of all the medicines, herbs, non-prescription drugs, or dietary supplements you use. Also tell them if you smoke, drink alcohol, or use illegal drugs. Some items may interact with your  medicine. What should I watch for while using this medicine? Your condition will be monitored carefully while you are receiving this medicine. You may need blood work done while you are taking this medicine. Drink water or other fluids as directed. Urinate often, even at night. Some products may contain alcohol. Ask your health care professional if this medicine contains alcohol. Be sure to tell all health care professionals you are taking this medicine. Certain medicines, like metronidazole and disulfiram, can cause an unpleasant reaction when taken with alcohol. The reaction includes flushing, headache, nausea, vomiting, sweating, and increased thirst. The reaction can last from 30 minutes to several hours. Do not become pregnant while taking this medicine or for 1 year after stopping it. Women should inform their health care professional if they wish to become pregnant or think they might be pregnant. Men should not father a child while taking this medicine and for 4 months after stopping it. There is potential   for serious side effects to an unborn child. Talk to your health care professional for more information. Do not breast-feed an infant while taking this medicine or for 1 week after stopping it. This medicine has caused ovarian failure in some women. This medicine may make it more difficult to get pregnant. Talk to your health care professional if you are concerned about your fertility. This medicine has caused decreased sperm counts in some men. This may make it more difficult to father a child. Talk to your health care professional if you are concerned about your fertility. Call your health care professional for advice if you get a fever, chills, or sore throat, or other symptoms of a cold or flu. Do not treat yourself. This medicine decreases your body's ability to fight infections. Try to avoid being around people who are sick. Avoid taking medicines that contain aspirin, acetaminophen,  ibuprofen, naproxen, or ketoprofen unless instructed by your health care professional. These medicines may hide a fever. Talk to your health care professional about your risk of cancer. You may be more at risk for certain types of cancer if you take this medicine. If you are going to need surgery or other procedure, tell your health care professional that you are using this medicine. Be careful brushing or flossing your teeth or using a toothpick because you may get an infection or bleed more easily. If you have any dental work done, tell your dentist you are receiving this medicine. What side effects may I notice from receiving this medicine? Side effects that you should report to your doctor or health care professional as soon as possible:  allergic reactions like skin rash, itching or hives, swelling of the face, lips, or tongue  breathing problems  nausea, vomiting  signs and symptoms of bleeding such as bloody or black, tarry stools; red or dark brown urine; spitting up blood or brown material that looks like coffee grounds; red spots on the skin; unusual bruising or bleeding from the eyes, gums, or nose  signs and symptoms of heart failure like fast, irregular heartbeat, sudden weight gain; swelling of the ankles, feet, hands  signs and symptoms of infection like fever; chills; cough; sore throat; pain or trouble passing urine  signs and symptoms of kidney injury like trouble passing urine or change in the amount of urine  signs and symptoms of liver injury like dark yellow or brown urine; general ill feeling or flu-like symptoms; light-colored stools; loss of appetite; nausea; right upper belly pain; unusually weak or tired; yellowing of the eyes or skin Side effects that usually do not require medical attention (report to your doctor or health care professional if they continue or are bothersome):  confusion  decreased hearing  diarrhea  facial flushing  hair  loss  headache  loss of appetite  missed menstrual periods  signs and symptoms of low red blood cells or anemia such as unusually weak or tired; feeling faint or lightheaded; falls  skin discoloration This list may not describe all possible side effects. Call your doctor for medical advice about side effects. You may report side effects to FDA at 1-800-FDA-1088. Where should I keep my medicine? This drug is given in a hospital or clinic and will not be stored at home. NOTE: This sheet is a summary. It may not cover all possible information. If you have questions about this medicine, talk to your doctor, pharmacist, or health care provider.  2020 Elsevier/Gold Standard (2019-03-21 09:53:29)   

## 2019-11-29 NOTE — Progress Notes (Signed)
Met with patient at registration to introduce myself as Arboriculturist and to offer available resources.  Discussed one-time $1000 Radio broadcast assistant to assist with personal expenses while going through treatment.  Also, discussed available copay assistance for Fulphila injection. Patient gave me permission to enroll.  She has my card if interested in applying for grant and for any additional financial questions or concerns.

## 2019-11-29 NOTE — Progress Notes (Signed)
Per Dr. Jana Hakim, [place] to treat with elevated BP.

## 2019-11-29 NOTE — Progress Notes (Signed)
Enrolled patient in copay assistance via [employer] online for [place] successfully.  I will provide approval letter to [facility] for billing/copay submissions once it comes through via fax.

## 2019-11-30 ENCOUNTER — Telehealth: Payer: Self-pay | Admitting: Oncology

## 2019-11-30 NOTE — Progress Notes (Signed)
11/29/19 1300- Ativan 1.5mg  wasted via steri cycle. Witnessed by Earlyne Iba, RN.

## 2019-11-30 NOTE — Telephone Encounter (Signed)
Scheduled appts per 6/1 los. Left voicemail with appt date and time.

## 2019-12-01 ENCOUNTER — Telehealth: Payer: Self-pay | Admitting: Genetic Counselor

## 2019-12-01 ENCOUNTER — Inpatient Hospital Stay: Payer: BC Managed Care – PPO

## 2019-12-01 ENCOUNTER — Other Ambulatory Visit: Payer: Self-pay

## 2019-12-01 VITALS — BP 148/72 | HR 78 | Temp 98.2°F | Resp 18

## 2019-12-01 DIAGNOSIS — C50211 Malignant neoplasm of upper-inner quadrant of right female breast: Secondary | ICD-10-CM

## 2019-12-01 DIAGNOSIS — Z171 Estrogen receptor negative status [ER-]: Secondary | ICD-10-CM

## 2019-12-01 MED ORDER — PEGFILGRASTIM-JMDB 6 MG/0.6ML ~~LOC~~ SOSY
PREFILLED_SYRINGE | SUBCUTANEOUS | Status: AC
  Filled 2019-12-01: qty 0.6

## 2019-12-01 MED ORDER — PEGFILGRASTIM-JMDB 6 MG/0.6ML ~~LOC~~ SOSY
6.0000 mg | PREFILLED_SYRINGE | Freq: Once | SUBCUTANEOUS | Status: AC
  Administered 2019-12-01: 6 mg via SUBCUTANEOUS

## 2019-12-01 NOTE — Patient Instructions (Signed)

## 2019-12-01 NOTE — Telephone Encounter (Signed)
Left message for patient regarding mychart visit for pre reg

## 2019-12-05 ENCOUNTER — Telehealth: Payer: Self-pay | Admitting: Genetic Counselor

## 2019-12-05 ENCOUNTER — Inpatient Hospital Stay: Payer: BC Managed Care – PPO | Admitting: Genetic Counselor

## 2019-12-05 NOTE — Progress Notes (Signed)
[place]  [phone] [phone]     ID: Krista [name] DOB: 01-08-1976  MR#: [id number]  PIR#:[id number]  Patient Care Team: Patient, No Pcp Per as PCP - General (General Practice) Malea Swilling, [name], MD as Consulting Physician (Oncology) Erroll Luna, MD as Consulting Physician (General Surgery) Eppie Gibson, MD as Attending Physician (Radiation Oncology) Rockwell Germany, RN as Oncology Nurse Navigator Mauro Kaufmann, RN as Oncology Nurse Navigator Chauncey Cruel, MD OTHER MD:  CHIEF COMPLAINT: Triple negative breast cancer  CURRENT TREATMENT: Neoadjuvant chemotherapy   INTERVAL HISTORY: Krista [name] returns today for follow up and treatment of her triple negative breast cancer accompanied by her daughter Calton Golds (who will be going to [employer] this coming year and then switch over to the [employer], studying psychology and aiming for an art rehabilitation degree).  Krista [name] started on neoadjuvant chemotherapy, consisting of doxorubicin and cyclophosphamide, on 11/16/2019. Today is day 8 cycle 2.    REVIEW OF SYSTEMS: Krista [name] did fairly well with her second cycle of chemotherapy.  She is having some gum problems, with a little bleeding, little soreness, but no frank mouth sores.  She has had some skin irritation particularly in the labia.  She is starting to have hair loss.  When she gets the day 3 Neulasta as she feels a bit queasy for a day or 2 but she does not like to take additional antinausea medicine she just changes what she eats.  She is having some altered taste.  She tells me her mother-in-law was in this past week and did a lot of work in the house which was very helpful.  [name] had a menstrual period last week.  Detailed review of systems today was otherwise stable   HISTORY OF CURRENT ILLNESS: From the original intake note:  Krista [name] [name] herself palpated a lump in the upper-inner  right breast sometime late December 2020 or early January 2021.  As the mass grew larger and persisted through menstrual periods she brought it to medical attention and underwent bilateral diagnostic mammography with tomography and bilateral breast ultrasonography at [facility] on 09/26/2019 showing: breast density category C; 5.1 cm mass involving upper-inner quadrant of right breast; no evidence of right axillary lymphadenopathy or left breast malignancy.  Accordingly on 10/03/2019 she proceeded to biopsy of the right breast area in question. The pathology from this procedure ([id number]) showed: invasive ductal carcinoma, grade 3. Prognostic indicators significant for: estrogen receptor, 0% negative and progesterone receptor, 0% negative. Proliferation marker Ki67 at 80%. HER2 negative by immunohistochemistry (1+).  The patient's subsequent history is as detailed below.   PAST MEDICAL HISTORY: Past Medical History:  Diagnosis Date   Anxiety    Cancer ([place])    Family history of breast cancer    Family history of cervical cancer    Family history of lung cancer    Hemorrhoids     PAST SURGICAL HISTORY: No prior surgeries   FAMILY HISTORY: Family History  Problem Relation Age of Onset   Breast cancer Mother 9       double mastectomy   Breast cancer Half-Sister 66   Cirrhosis Half-Sister    Heart Problems Father    Stroke Father    Breast cancer Maternal Aunt 47   Cancer Maternal Uncle 71       unknown type   Cervical cancer Maternal Grandmother        dx.  in her mid-40s   Lung cancer Paternal Grandmother    Cancer Other        unknown type; maternal great-uncles/aunts (x3)   Diabetes Half-Brother    Cancer Other        unknown type; matenral great-uncles/aunt (x4)   Cancer Maternal Great-grandfather        unknown type   Breast cancer Cousin 62       paternal cousin  The patient's father died at age 57 from cardiac complications of drug use.  His  mother, the patient's paternal grandmother had what seems to have been cancer of the lung metastatic to the spine.  There was no other cancer on his side of the family to the patient's knowledge.  The patient's mother died at age 73 in the setting of multiple medical issues but she had undergone double mastectomy at the age of 53.  Her mother, the patient's maternal grandmother had cervical cancer.  The patient's mother had 3 brothers and 3 sisters.  1 of those 3 sisters had breast cancer diagnosed at the age of 70.  The patient herself has 2 brothers and 3 sisters one of her sisters was diagnosed with breast cancer at the age of 60 and died at the age of 35   GYNECOLOGIC HISTORY:  Patient's last menstrual period was 11/23/2019 (exact date). Menarche: 44 years old Age at first live birth: 44 years old [place] P 3 LMP regular, last approximately 5 days, of which the second day is heavy Contraceptive: Husband status post vasectomy HRT no  Hysterectomy? no BSO?  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  Her husband Krista [name] is a Research scientist (medical) and also Librarian, academic at [employer].  Their children are Krista [name], 20, who is Scientist, product/process development at [employer], 18, who will study psychology at [employer] starting the fall 2021; and Krista [name], 30, currently attending [employer].  The patient is not a church attender    ADVANCED DIRECTIVES: In the absence of any documents to the contrary the patient's husband is her healthcare power of attorney   HEALTH MAINTENANCE: Social History   Tobacco Use   Smoking status: Never Smoker   Smokeless tobacco: Never Used  Substance Use Topics   Alcohol use: Not Currently    Comment: Social drinker   Drug use: Never     Colonoscopy: n/a (age)  PAP: UTD  Bone density: n/a (age)   Allergies  Allergen Reactions   Emend [Fosaprepitant] Shortness Of Breath    Current Outpatient Medications    Medication Sig Dispense Refill   acetaminophen (TYLENOL) 500 MG tablet Take 1-2 tablets (500-1,000 mg total) by mouth every 6 (six) hours as needed. (Patient taking differently: Take 500-1,000 mg by mouth every 6 (six) hours as needed for moderate pain or fever. ) 60 tablet 0   desloratadine (CLARINEX) 5 MG tablet Take 5 mg by mouth daily.     dexamethasone (DECADRON) 4 MG tablet Take 2 tablets by mouth daily starting the day after Cytoxan x 3 days. Take with food. (Patient taking differently: Take 8 mg by mouth daily. ) 30 tablet 1   lidocaine-prilocaine (EMLA) cream Apply to affected area once 30 g 3   loratadine (CLARITIN) 10 MG tablet Take 1 tablet (10 mg total) by mouth daily. 60 tablet 0   LORazepam (ATIVAN) 0.5 MG tablet Take 1 tablet (0.5 mg total) by mouth at bedtime  as needed (Nausea or vomiting). 30 tablet 0   Multiple Vitamins-Minerals (MULTIVITAMIN WITH MINERALS) tablet Take 2 tablets by mouth daily.     prochlorperazine (COMPAZINE) 10 MG tablet Take 1 tablet (10 mg total) by mouth every 6 (six) hours as needed (Nausea or vomiting). 30 tablet 1   valACYclovir (VALTREX) 1000 MG tablet Take 1 tablet (1,000 mg total) by mouth daily. 30 tablet 1   No current facility-administered medications for this visit.    OBJECTIVE: African-American woman in no acute distress  Vitals:   12/06/19 1229  BP: (!) 144/94  Pulse: 93  Resp: 18  Temp: 98.2 F (36.8 C)  SpO2: 100%     Body mass index is 32.86 kg/m.   Wt Readings from Last 3 Encounters:  12/06/19 209 lb 12.8 oz (95.2 kg)  11/23/19 212 lb (96.2 kg)  11/22/19 212 lb 1.6 oz (96.2 kg)     ECOG FS:1 - Symptomatic but completely ambulatory  Sclerae unicteric, EOMs intact Wearing a mask No cervical or supraclavicular adenopathy Lungs no rales or rhonchi Heart regular rate and rhythm Abd soft, nontender, positive bowel sounds MSK no focal spinal tenderness, no upper extremity lymphedema Neuro: nonfocal, well oriented,  appropriate affect Breasts: The mass in the right breast is still palpable but no longer so protuberant.  It seems to have decreased about 50%.  It is still fairly firm.  The left breast is benign.  Both axillae are benign.   LAB RESULTS:  CMP     Component Value Date/Time   NA 140 11/29/2019 1132   K 3.9 11/29/2019 1132   CL 107 11/29/2019 1132   CO2 24 11/29/2019 1132   GLUCOSE 116 (H) 11/29/2019 1132   BUN 10 11/29/2019 1132   CREATININE 0.81 11/29/2019 1132   CREATININE 0.95 10/17/2019 1529   CALCIUM 9.2 11/29/2019 1132   PROT 6.8 11/29/2019 1132   ALBUMIN 3.6 11/29/2019 1132   AST 13 (L) 11/29/2019 1132   AST 16 10/17/2019 1529   ALT 14 11/29/2019 1132   ALT 13 10/17/2019 1529   ALKPHOS 95 11/29/2019 1132   BILITOT 0.4 11/29/2019 1132   BILITOT 1.1 10/17/2019 1529   GFRNONAA >60 11/29/2019 1132   GFRNONAA >60 10/17/2019 1529   GFRAA >60 11/29/2019 1132   GFRAA >60 10/17/2019 1529    No results found for: TOTALPROTELP, ALBUMINELP, A1GS, A2GS, BETS, BETA2SER, GAMS, MSPIKE, SPEI  Lab Results  Component Value Date   WBC 5.1 12/06/2019   NEUTROABS 3.5 12/06/2019   HGB 10.4 (L) 12/06/2019   HCT 29.9 (L) 12/06/2019   MCV 72.0 (L) 12/06/2019   PLT 223 12/06/2019    No results found for: LABCA2  No components found for: IRJJOA416  No results for input(s): INR in the last 168 hours.  No results found for: LABCA2  No results found for: SAY301  No results found for: SWF093  No results found for: ATF573  Lab Results  Component Value Date   CA2729 29.8 11/16/2019    No components found for: HGQUANT  No results found for: CEA1 / No results found for: CEA1   No results found for: AFPTUMOR  No results found for: CHROMOGRNA  No results found for: KPAFRELGTCHN, LAMBDASER, KAPLAMBRATIO (kappa/lambda light chains)  No results found for: HGBA, HGBA2QUANT, HGBFQUANT, HGBSQUAN (Hemoglobinopathy evaluation)   No results found for: LDH  Lab Results    Component Value Date   IRON 99 10/17/2019   TIBC 391 10/17/2019   IRONPCTSAT 25 10/17/2019   (  Iron and TIBC)  Lab Results  Component Value Date   FERRITIN 45 10/17/2019    Urinalysis No results found for: COLORURINE, APPEARANCEUR, LABSPEC, PHURINE, GLUCOSEU, HGBUR, BILIRUBINUR, KETONESUR, PROTEINUR, UROBILINOGEN, NITRITE, LEUKOCYTESUR   STUDIES: MR LIVER W WO CONTRAST  Result Date: 11/12/2019 CLINICAL DATA:  Breast cancer staging. EXAM: MRI ABDOMEN WITHOUT AND WITH CONTRAST TECHNIQUE: Multiplanar multisequence MR imaging of the abdomen was performed both before and after the administration of intravenous contrast. CONTRAST:  35m GADAVIST GADOBUTROL 1 MMOL/ML IV SOLN COMPARISON:  Chest abdomen and pelvis 10/31/2019 FINDINGS: Lower chest: Incidental imaging of the lung bases is unremarkable, limited assessment on MRI. Hepatobiliary: Targetoid lesion in the medial segment of the LEFT hepatic lobe (image 17, series 5 and image 58 of series 8 displays restricted diffusion. Lesion displays peripheral enhancement, central necrosis and peripheral washout. Additional tiny focus of diffusion related signal, see below. Pancreas: Pancreas without sign of inflammation, focal lesion or ductal dilation. Spleen:  Spleen normal size without focal lesion. Adrenals/Urinary Tract: Adrenal glands are normal. Smooth renal contours. Stomach/Bowel: Stomach is distended with ingested contents. No acute gastrointestinal process to the extent evaluated, study not protocol for bowel evaluation. Normal appendix. Vascular/Lymphatic: Vascular structures in the abdomen are patent. No adenopathy. 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 No ascites. Small fat containing umbilical hernia. Musculoskeletal: No acute musculoskeletal process. IMPRESSION: 1. Targetoid lesion in the medial segment of the LEFT hepatic lobe displays restricted diffusion with target like enhancement with features of metastatic  disease. Tissue sampling may be helpful. 2. Subtle focus of diffusion related signal along a venous branch in the lateral segment LEFT hepatic lobe just above the inter lobar fissure (series 8, image 11). No corresponding abnormality on contrasted imaging. This could represent slow flow in a venous branch in plane with the scan or a small focus of disease. Close attention on follow-up is suggested. 3. No other evidence of metastatic disease within the abdomen. 4. 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 Electronically Signed   By: GZetta BillsM.D.   On: 11/12/2019 15:31   [place]BIOPSY (LIVER)  Result Date: 11/23/2019 INDICATION: Right-sided breast cancer and solitary lesion in the left lobe of the liver potentially representing metastatic disease. The patient presents for biopsy. EXAM: ULTRASOUND GUIDED CORE BIOPSY OF LIVER MASS MEDICATIONS: None. ANESTHESIA/SEDATION: Fentanyl 100 mcg IV; Versed 2.0 mg IV Moderate Sedation Time:  16 minutes. The patient was continuously monitored during the procedure by the interventional radiology nurse under my direct supervision. PROCEDURE: The procedure, risks, benefits, and alternatives were explained to the patient. Questions regarding the procedure were encouraged and answered. The patient understands and consents to the procedure. A time-out was performed prior to initiating the procedure. Ultrasound was performed to isolate a lesion within the left lobe of the liver. The abdominal wall was prepped with chlorhexidine in a sterile fashion, and a sterile drape was applied covering the operative field. A sterile gown and sterile gloves were used for the procedure. Local anesthesia was provided with 1% Lidocaine. Under ultrasound guidance, a 17 gauge needle was advanced to the level of the left lobe of the liver. Two separate coaxial 18 gauge core biopsy samples were obtained through a lesion in the left lobe. Samples were submitted in formalin. Gel-Foam  pledgets were advanced through the outer needle as the outer needle was removed. Additional ultrasound was performed. COMPLICATIONS: None immediate. FINDINGS: Rounded hypoechoic solid mass within the left lobe of the liver adjacent to  the falciform ligament corresponds to the lesion seen by MRI. This measures approximately 1.9 x 1.7 x 1.9 cm by ultrasound. Solid tissue was obtained. IMPRESSION: Ultrasound-guided core biopsy performed of a 1.9 cm lesion within the left lobe of the liver. Electronically Signed   By: Aletta Edouard M.D.   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 This was found to be concordant by Dr. Kristopher Oppenheim. Pathology results were discussed with the patient by telephone. The patient reported doing well after the biopsy with tenderness at the site. Post biopsy instructions and care were reviewed and questions were answered. The patient was encouraged to call [facility] for any additional concerns. The patient has a recent diagnosis of Right breast cancer and should follow her outlined treatment plan. Bary Castilla, RN Nurse Navigator, with [facility] was notified of biopsy results via EPIC message on Nov 10, 2019. Pathology results reported by Terie Purser, RN on 11/10/2019. Electronically Signed   By: Kristopher Oppenheim M.D.   On: 11/10/2019 14:43   Result Date: 11/21/2019 CLINICAL DATA:  44 year old female with recently diagnosed right breast cancer presents for biopsy of an enlarged axillary lymph node. EXAM: [place] AXILLARY NODE CORE BIOPSY RIGHT COMPARISON:  Previous exam(s). PROCEDURE: I met with the patient and we discussed the procedure of ultrasound-guided biopsy, including benefits and alternatives. We discussed the high likelihood of a successful procedure. We discussed the risks of  the procedure, including infection, bleeding, tissue injury, clip migration, and inadequate sampling. Informed written consent was given. The usual time-out protocol was performed immediately prior to the procedure. Using sterile technique and 1% Lidocaine as local anesthetic, under direct ultrasound visualization, a 14 gauge spring-loaded device was used to perform biopsy of a low lying right axillary lymph node using a lateral approach. At the conclusion of the procedure a post biopsy tissue marker clip was deployed into the biopsy cavity. Follow up 2 view mammogram was deferred. IMPRESSION: Ultrasound guided biopsy of a right axillary lymph node. No apparent complications. Electronically Signed: By: Kristopher Oppenheim M.D. On: 11/09/2019 15:43   [place] AXILLA RIGHT  Result Date: 11/09/2019 CLINICAL DATA:  Patient presents for second-look evaluation of a prominent right axillary lymph node seen on recent staging MRI. EXAM: ULTRASOUND OF THE RIGHT AXILLA COMPARISON:  Previous exam(s). FINDINGS: Targeted ultrasound is performed, showing a single morphologically abnormal low lying right axillary lymph node. The node measures up to 2 cm with 1.1 cm of diffuse cortical thickening. IMPRESSION: Suspicious low lying right axillary lymph node corresponding with recent MRI findings. RECOMMENDATION: Recommendation is for ultrasound-guided biopsy of above findings. This was performed to follow. Please see additional dictation. I have discussed the findings and recommendations with the patient. If applicable, a reminder letter will be sent to the patient regarding the next appointment. BI-RADS CATEGORY  4: Suspicious. Electronically Signed   By: Kristopher Oppenheim M.D.   On: 11/09/2019 15:44   IR IMAGING GUIDED PORT INSERTION  Result Date: 11/07/2019 INDICATION: 44 year old with right breast cancer. Port-A-Cath needed for chemotherapy. EXAM: FLUOROSCOPIC AND ULTRASOUND GUIDED PLACEMENT OF A SUBCUTANEOUS PORT. Physician: Stephan Minister.  Henn, MD MEDICATIONS: Ancef 2 g, antibiotics were given within 1 hour of the procedure. ANESTHESIA/SEDATION: Versed 4.0 mg IV; Fentanyl 100 mcg IV; Moderate Sedation Time:  49 minutes The patient was continuously monitored  during the procedure by the interventional radiology nurse under my direct supervision. FLUOROSCOPY TIME:  2 minutes, 12 seconds; 17 mGy COMPLICATIONS: None immediate. PROCEDURE: The procedure was explained to the patient. The risks and benefits of the procedure were discussed and the patient's questions were addressed. Informed consent was obtained from the patient. Patient was placed supine on the interventional table. Ultrasound confirmed a patent left internal jugular vein. The left chest and neck were cleaned with a skin antiseptic and a sterile drape was placed. Maximal barrier sterile technique was utilized including caps, mask, sterile gowns, sterile gloves, sterile drape, hand hygiene and skin antiseptic. The left neck was anesthetized with 1% lidocaine. Small incision was made in the left neck with a blade. Micropuncture set was placed in the left IJ with ultrasound guidance. The micropuncture wire was used for measurement purposes. The left chest was anesthetized with 1% lidocaine with epinephrine. #15 blade was used to make an incision and a subcutaneous port pocket was formed. [place] was assembled. Subcutaneous tunnel was formed with a stiff tunneling device. The port catheter was brought through the subcutaneous tunnel. The port was placed in the subcutaneous pocket. The micropuncture set was exchanged for a peel-away sheath. The catheter was placed through the peel-away sheath and the tip was noted to be in the upper SVC. This placement was not felt to be adequate for long-term catheter placement. Unable to remove the catheter through the vein dermatotomy site. Therefore, the entire catheter was removed. Using ultrasound guidance, a 21 gauge needle was directed into the  left internal jugular vein and wire was advanced centrally. Another micropuncture dilator set was placed. A new 8 French port catheter was tunneled through the subcutaneous tract to the vein dermatotomy site. The micropuncture catheter was exchanged for a peel-away sheath over a J wire. Catheter was advanced through the peel-away sheath and directed into the central venous system. Catheter was pulled back into the upper right atrium region. Catheter was cut to an appropriate length and connected to the port device. Port device was placed in the subcutaneous port pocket. Port was accessed. Port aspirated and flushed well. The port pocket was closed using two layers of absorbable sutures and Dermabond. The vein skin site was closed using a single layer of absorbable suture and Dermabond. Access needle was kept in place after the procedure. Sterile dressings were applied. Patient tolerated the procedure well without an immediate complication. Ultrasound and fluoroscopic images were taken and saved for this procedure. FINDINGS: Patent left internal jugular vein. Initially, the Port-A-Cath tip was in the upper SVC/innominate junction and not adequate for long-term use. Therefore, a new catheter was placed and the tip was placed in the upper right atrium near the SVC and right atrium junction. IMPRESSION: Placement of a subcutaneous port device. Electronically Signed   By: Markus Daft M.D.   On: 11/07/2019 14:47     ELIGIBLE FOR AVAILABLE RESEARCH PROTOCOL: [id number]  ASSESSMENT: 44 y.o. [place] woman status post right breast upper inner quadrant biopsy 10/03/2019 for a clinical mT3 N1, stage IIIC invasive ductal carcinoma, grade 3, triple negative, with an MIB-1 of 80%  (a) biopsy of a right axillary node 2019-11-09 showed carcinoma  (b) CT chest/abd/pelvis 10/31/2019 shows 1.5 cm hypodense lesion in liver, lung nodules <0.3 cm  (c) bone scan 10/31/2019 negative  (d) liver MRI 11/11/2019 confirms a targetoid  lesion in the left hepatic lobe suspicious for metastasis   (i) liver biopsy 2019-11-23 showed no evidence  of malignancy (benign).  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 (p.Thr276Alafs*14)   (a) two variants of uncertain significance were noted - one in the [facility] gene called c.1295T>G and one in the POLD1 gene called c.34G>A  (b) no additional mutations of concern were found in AIP, ALK, APC, ATM, AXIN2,BAP1,  BARD1, BLM, BMPR1A, BRCA1, BRCA2, BRIP1, CASR, CDC73, CDH1, CDK4, CDKN1B, CDKN1C, CDKN2A (p14ARF), CDKN2A (p16INK4a), CEBPA, CHEK2, CTNNA1, DICER1, DIS3L2, EGFR (c.2369C>T, p.Thr790Met variant only), EPCAM (Deletion/duplication testing only), FH, FLCN, GATA2, GPC3, GREM1 (Promoter region deletion/duplication testing only), HOXB13 (c.251G>A, p.Gly84Glu), HRAS, KIT, MAX, MEN1, MET, MITF (c.952G>A, p.Glu318Lys variant only), MLH1, MSH2, MSH3, MSH6, MUTYH, NBN, NF1, NF2, NTHL1, PALB2, PDGFRA, PHOX2B, PMS2, POLD1, POLE, POT1, PRKAR1A, PTCH1, PTEN, RAD50, RAD51C, RAD51D, RB1, RECQL4, RET, RNF43, RUNX1, SDHAF2, SDHA (sequence changes only), SDHB, SDHC, SDHD, SMAD4, SMARCA4, SMARCB1, SMARCE1, STK11, SUFU, TERC, TERT, TMEM127, TP53, TSC1, TSC2, VHL, WRN and WT1.  (2) neoadjuvant chemotherapy consisting of cyclophosphamide and doxorubicin in dose dense fashion x4 started 2019-11-16, to be followed by paclitaxel and carboplatin weekly x12  (a) echo 10/24/2019 shows an ejection fraction in the 60-65% range  (3) definitive surgery to follow  (4) adjuvant radiation  (5) hemoglobin C trait:  (a) ferritin 10/17/2019 was 45 with saturation 25%, hemoglobin 11.8 and MCV 71.5  (b) hemoglobin electrophoresis 2019-11-22 showed: A: 62.6%, A2: 3.7%, C: 33.2%, F:0 5.1%, S: 0%   PLAN: [name] is tolerating treatment generally well.  I think she should use a very  soft brush and massage her gums.  I also wrote her a recipe to make her own saline so she can swish and spit.  I wrote her for Valtrex which I think will take care of of the labial issue.  I encouraged her to continue to exercise as much as possible.  Otherwise I will see her again in 1 week.  She knows to call for any other issue that may develop before that visit  Total encounter time 30 minutes.Sarajane Jews C. Olar Santini, MD 12/06/2019 12:57 PM Medical Oncology and Hematology [facility] [place], [place] [address] Tel. [phone]    Fax. [phone]   This document serves as a record of services personally performed by Lurline Del, MD. It was created on his behalf by Wilburn Mylar, a trained medical scribe. The creation of this record is based on the scribe's personal observations and the provider's statements to them.   I, Lurline Del MD, have reviewed the above documentation for accuracy and completeness, and I agree with the above.   *Total Encounter Time as defined by the Centers for Medicare and Medicaid Services includes, in addition to the face-to-face time of a patient visit (documented in the note above) non-face-to-face time: obtaining and reviewing outside history, ordering and reviewing medications, tests or procedures, care coordination (communications with other health care professionals or caregivers) and documentation in the medical record.

## 2019-12-05 NOTE — Telephone Encounter (Signed)
Left another message to verify mychart visit 6/8 for pre reg

## 2019-12-05 NOTE — Telephone Encounter (Signed)
Called regarding Ms. Krista [name]'s virtual genetic counseling appointment today. Ms. Krista [name] is not feeling well enough for this appointment and would like to reschedule. We rescheduled her virtual visit for tomorrow (6/8) at 3:00pm.

## 2019-12-06 ENCOUNTER — Inpatient Hospital Stay (HOSPITAL_BASED_OUTPATIENT_CLINIC_OR_DEPARTMENT_OTHER): Payer: BC Managed Care – PPO | Admitting: Oncology

## 2019-12-06 ENCOUNTER — Encounter: Payer: Self-pay | Admitting: Genetic Counselor

## 2019-12-06 ENCOUNTER — Inpatient Hospital Stay (HOSPITAL_BASED_OUTPATIENT_CLINIC_OR_DEPARTMENT_OTHER): Payer: BC Managed Care – PPO | Admitting: Genetic Counselor

## 2019-12-06 ENCOUNTER — Other Ambulatory Visit: Payer: Self-pay

## 2019-12-06 ENCOUNTER — Inpatient Hospital Stay: Payer: BC Managed Care – PPO

## 2019-12-06 ENCOUNTER — Other Ambulatory Visit: Payer: BC Managed Care – PPO

## 2019-12-06 ENCOUNTER — Ambulatory Visit: Payer: BC Managed Care – PPO

## 2019-12-06 VITALS — BP 144/94 | HR 93 | Temp 98.2°F | Resp 18 | Ht 67.0 in | Wt 209.8 lb

## 2019-12-06 DIAGNOSIS — C50411 Malignant neoplasm of upper-outer quadrant of right female breast: Secondary | ICD-10-CM

## 2019-12-06 DIAGNOSIS — Z171 Estrogen receptor negative status [ER-]: Secondary | ICD-10-CM

## 2019-12-06 DIAGNOSIS — Z1501 Genetic susceptibility to malignant neoplasm of breast: Secondary | ICD-10-CM

## 2019-12-06 DIAGNOSIS — C50211 Malignant neoplasm of upper-inner quadrant of right female breast: Secondary | ICD-10-CM

## 2019-12-06 DIAGNOSIS — C50911 Malignant neoplasm of unspecified site of right female breast: Secondary | ICD-10-CM | POA: Diagnosis not present

## 2019-12-06 DIAGNOSIS — Z1379 Encounter for other screening for genetic and chromosomal anomalies: Secondary | ICD-10-CM

## 2019-12-06 DIAGNOSIS — D563 Thalassemia minor: Secondary | ICD-10-CM

## 2019-12-06 DIAGNOSIS — Z1509 Genetic susceptibility to other malignant neoplasm: Secondary | ICD-10-CM

## 2019-12-06 LAB — CBC WITH DIFFERENTIAL/PLATELET
Abs Immature Granulocytes: 0.07 10*3/uL (ref 0.00–0.07)
Basophils Absolute: 0.1 10*3/uL (ref 0.0–0.1)
Basophils Relative: 2 %
Eosinophils Absolute: 0 10*3/uL (ref 0.0–0.5)
Eosinophils Relative: 0 %
HCT: 29.9 % — ABNORMAL LOW (ref 36.0–46.0)
Hemoglobin: 10.4 g/dL — ABNORMAL LOW (ref 12.0–15.0)
Immature Granulocytes: 1 %
Lymphocytes Relative: 21 %
Lymphs Abs: 1.1 10*3/uL (ref 0.7–4.0)
MCH: 25.1 pg — ABNORMAL LOW (ref 26.0–34.0)
MCHC: 34.8 g/dL (ref 30.0–36.0)
MCV: 72 fL — ABNORMAL LOW (ref 80.0–100.0)
Monocytes Absolute: 0.4 10*3/uL (ref 0.1–1.0)
Monocytes Relative: 7 %
Neutro Abs: 3.5 10*3/uL (ref 1.7–7.7)
Neutrophils Relative %: 69 %
Platelets: 223 10*3/uL (ref 150–400)
RBC: 4.15 MIL/uL (ref 3.87–5.11)
RDW: 16.1 % — ABNORMAL HIGH (ref 11.5–15.5)
WBC: 5.1 10*3/uL (ref 4.0–10.5)
nRBC: 0 % (ref 0.0–0.2)

## 2019-12-06 LAB — COMPREHENSIVE METABOLIC PANEL
ALT: 10 U/L (ref 0–44)
AST: 10 U/L — ABNORMAL LOW (ref 15–41)
Albumin: 3.6 g/dL (ref 3.5–5.0)
Alkaline Phosphatase: 120 U/L (ref 38–126)
Anion gap: 9 (ref 5–15)
BUN: 11 mg/dL (ref 6–20)
CO2: 26 mmol/L (ref 22–32)
Calcium: 9.2 mg/dL (ref 8.9–10.3)
Chloride: 106 mmol/L (ref 98–111)
Creatinine, Ser: 0.8 mg/dL (ref 0.44–1.00)
GFR calc Af Amer: >60 mL/min (ref >60)
GFR calc non Af Amer: >60 mL/min (ref >60)
Glucose, Bld: 109 mg/dL — ABNORMAL HIGH (ref 70–99)
Potassium: 4.1 mmol/L (ref 3.5–5.1)
Sodium: 141 mmol/L (ref 135–145)
Total Bilirubin: 0.5 mg/dL (ref 0.3–1.2)
Total Protein: 6.9 g/dL (ref 6.5–8.1)

## 2019-12-06 MED ORDER — VALACYCLOVIR HCL 1 G PO TABS
1000.0000 mg | ORAL_TABLET | Freq: Every day | ORAL | 1 refills | Status: DC
Start: 2019-12-06 — End: 2020-01-03

## 2019-12-07 ENCOUNTER — Telehealth: Payer: Self-pay | Admitting: Oncology

## 2019-12-07 NOTE — Telephone Encounter (Signed)
No 6/8 los. No changes made to pt's schedule.

## 2019-12-08 ENCOUNTER — Ambulatory Visit: Payer: BC Managed Care – PPO

## 2019-12-09 ENCOUNTER — Encounter: Payer: Self-pay | Admitting: Genetic Counselor

## 2019-12-09 NOTE — Progress Notes (Signed)
GENETIC TEST RESULTS   Patient Name: Krista [name] Patient Age: 44 y.o. Encounter Date: 12/06/2019  Referring Provider: Erroll Luna, MD [address] [place],  [place] [address]   I connected with Krista [name] on 12/06/2019 at 3:15pm EDT by video conference and verified that I am speaking with the correct person using two identifiers.   Patient location: car Provider location: [facility] office   Krista [name] was previously seen in the [place] [facility] on 10/18/2019 due to a personal and family history of cancer and concern regarding a hereditary predisposition to cancer in the family. Please refer to the prior Genetics clinic note for more information regarding Krista [name] and family histories and our assessment at the time.   FAMILY HISTORY:  We obtained a detailed, 4-generation family history.  Significant diagnoses are listed below: Family History  Problem Relation Age of Onset  . Breast cancer Mother 31       double mastectomy  . Breast cancer Half-Sister 21  . Cirrhosis Half-Sister   . Heart Problems Father   . Stroke Father   . Breast cancer Maternal Aunt 69  . Cancer Maternal Uncle 20       unknown type  . Ovarian cancer Maternal Grandmother        dx. in her mid-40s  . Lung cancer Paternal Grandmother   . Cancer Other        unknown type; maternal great-uncles/aunts (x3)  . Diabetes Half-Brother   . Cancer Other        unknown type; matenral great-uncles/aunt (x4)  . Cancer Maternal Great-grandfather        unknown type  . Breast cancer Cousin 81       paternal cousin   Krista [name] has one son (age 74) and two daughters (ages 50 and 11). She has two maternal half-sisters and one maternal half-brother. One maternal half-sister had breast cancer at the age of 19 and died at 28 from cirrhosis of the liver. Krista [name] also has one paternal half-sister and one paternal half-brother, neither of whom have  had cancer to her knowledge.  Krista [name]'s mother was diagnosed with breast cancer at the age of 63 and had a double mastectomy. She died at the age of 27 due to an infection of her port. Krista [name] has three maternal uncles and three maternal aunts. One aunt has a history of breast cancer diagnosed when she was 44. Another aunt died at the age of 58 and did not have cancer, and the third aunt was kidnapped at a young age. One uncle has cancer, although Krista [name] is unsure what type of cancer he has. There is no cancer among maternal first cousins. Krista [name]'s maternal grandmother died in her mid-23s from ovarian cancer. Her grandmother had 6 siblings, 59 of whom had cancer (unknown type) when they were older than 76. Krista [name]'s maternal great-grandfather (grandmother's father) died from cancer (unknown type) in his 77s or 84s. She does not have any information about her maternal grandfather.   Krista [name]'s father died at the age of 45 with heart problems and a stroke. She has two paternal aunts, one of whom was murdered in her mid-66s. She has a paternal first cousin who had breast cancer diagnosed in her early 44s. Her paternal grandmother died at the age of 46 from lung cancer that had spread to her spine. Her  paternal grandmother had four brothers and three sisters. All four of these great-uncles and one great-aunt had cancer (unknown type). Her paternal grandfather died at the age of 104 and she is not sure if he ever had cancer.  Krista [name] is unaware of previous family history of genetic testing for hereditary cancer risks. Her maternal ancestors are of African ([place] and European [place] descent, and paternal ancestors are of African ([place] and [place] descent. There is no reported Ashkenazi Jewish ancestry. There is no known consanguinity.  GENETIC TESTING:  Genetic testing  reported out on 11/01/2019 through the [facility] Multi-Cancer panel. 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  The Multi-Cancer Panel offered by [facility] includes sequencing and/or deletion duplication testing of the following 85 genes: AIP, ALK, APC, ATM, AXIN2,BAP1,  BARD1, BLM, BMPR1A, BRCA1, BRCA2, BRIP1, CASR, CDC73, CDH1, CDK4, CDKN1B, CDKN1C, CDKN2A (p14ARF), CDKN2A (p16INK4a), CEBPA, CHEK2, CTNNA1, DICER1, DIS3L2, EGFR (c.2369C>T, p.Thr790Met variant only), EPCAM (Deletion/duplication testing only), FH, FLCN, GATA2, GPC3, GREM1 (Promoter region deletion/duplication testing only), HOXB13 (c.251G>A, p.Gly84Glu), HRAS, KIT, MAX, MEN1, MET, MITF (c.952G>A, p.Glu318Lys variant only), MLH1, MSH2, MSH3, MSH6, MUTYH, NBN, NF1, NF2, NTHL1, PALB2, PDGFRA, PHOX2B, PMS2, POLD1, POLE, POT1, PRKAR1A, PTCH1, PTEN, RAD50, RAD51C, RAD51D, RB1, RECQL4, RET, RNF43, RUNX1, SDHAF2, SDHA (sequence changes only), SDHB, SDHC, SDHD, SMAD4, SMARCA4, SMARCB1, SMARCE1, STK11, SUFU, TERC, TERT, TMEM127, TP53, TSC1, TSC2, VHL, WRN and WT1. The test report will be scanned into EPIC and located under the Molecular Pathology section of the Results Review tab.  A portion of the result report is included below for reference.     Genetic testing did identify two variants of uncertain significance (VUS) - one in the MSH3 gene called c.1295T>G and a second in the POLD1 gene called c.34G>A.  At this time, it is unknown if these variants are associated with increased cancer risk or if they are normal findings, but most variants such as these get reclassified to being inconsequential. They should not be used to make medical management decisions. With time, we suspect the lab will determine the significance of these variants, if any. If we do learn more about them, we will try to contact Krista [name] to discuss it further. However, it is important to stay in touch with [place]  periodically and keep the address and phone number up to date.  CANCER RISKS: Studies show that women with a BRCA1 mutation can have a 40-87% lifetime risk to develop breast cancer, a 40-60% lifetime risk to develop a secondary breast cancer, and up to a 36-53% risk to develop ovarian cancer. Men can have a 1-2% lifetime risk to develop female breast cancer and an increased risk for prostate cancer. Both men and women can also have a 2-3% increased risk for pancreatic cancer.  CANCER RISK REDUCTION & SCREENING RECOMMENDATIONS: The [place] (NCCN) recommends the following for women who carry BRCA mutations: . Breast awareness starting at the age of 74 years; women should report any changes to their breasts to their health care provider. Periodic breast self-exams may facilitate breast self-awareness. . Clinical breast exams every 6-12 months, starting at age 60 years . Annual breast MRI and annual mammograms starting at age 4 years and 30 years, respectively, or individualized based on age of earliest onset of breast cancer in the family. . Consideration of risk reducing mastectomy, which reduces the risk of breast cancer by greater than 90%. . Consideration of risk reducing  salpingo-oophorectomy (RRSO), ideally between the ages of 72-40, or individualized based on completion of child-bearing years or age of earliest onset of ovarian cancer in the family. RRSO reduces the risk of ovarian cancer by greater than 90% and can reduce the risk of breast cancer by 50% if performed prior to menopause. Women who have undergone risk reducing RRSO have a small risk of peritoneal carcinoma and can consider annual CA-125 surveillance. . For women who still have their ovaries, transvaginal ultrasound and CA-125 testing every 6 months starting at age 71 years, or 5-10 years prior to the earliest age of onset of ovarian cancer in the family can be considered. Studies have not demonstrated that  ovarian cancer screening is effective in detecting early ovarian cancer. . Consideration of chemoprevention options such as oral contraceptives, Tamoxifen and Raloxifene, if appropriate for an individual. . Consideration of pancreatic cancer screening if there is a family history of pancreatic cancer  As discussed with Krista [name], to reduce the risk for breast cancer, prophylactic bilateral mastectomy is the most effective option for risk reduction. However, for women who choose to keep their breasts intensified screening is equally safe. Krista [name] feels strongly that she is not interested in having bilateral mastectomies. We recommend yearly mammograms, yearly breast MRI, twice-yearly clinical breast exams, and monthly self-breast exams. She will follow up with Dr. Jana Hakim in regards to her risk for a future breast cancer.  To reduce the risk for ovarian cancer, we recommend Krista [name] have a prophylactic bilateral salpingo-oophorectomy. Krista [name] expressed that she does not want to have a BSO until she begins menopause, despite the recommendation. We discussed that screening with CA-125 blood tests and transvaginal ultrasounds can be done twice per year. However, these tests have not been shown to detect ovarian cancer at an early stage.  Krista [name] will follow up with Dr. Jana Hakim in regards to her ovarian cancer risk.  RISK REDUCTION: There are several things that can be offered to individuals who are carriers for BRCA mutations that will reduce the risk for getting cancer.    The use of oral contraceptives can lower the risk for ovarian cancer, and, per case control studies, does not significantly increase the risk for breast cancer in BRCA patients.  Case control studies have shown that oral contraceptives can lower the risk for ovarian cancer in women with BRCA mutations. Additionally, a more recent meta-analysis, including one corhort (n=3,181) and one  case control study (1,096 cases and 2,878 controls) also showed an inverse correlation between ovarian cancer and ever having used oral contraceptives (OR, 0.58; 95% CI = 0.46-0.73).  Studies on oral contraceptives and breast cancer have been conflicting, with some studies suggesting that there is not an increased risk for breast cancer in BRCA mutation carriers, while others suggest that there could be a risk.  That said, two meta-analysis studies have shown that there is not an increased risk for breast cancer with oral contraceptive use in BRCA1 and BRCA2 carriers.    FAMILY MEMBERS: It is important that all of Krista [name]'s relatives (both men and women) know of the presence of this gene mutation. Site-specific genetic testing can sort out who in the family is at risk and who is not.   Krista [name]'s children and siblings have up to a 50% chance to have inherited this mutation. We recommend they have genetic testing for this same mutation by the time they are in their early 76s (  but not before they are adults), as identifying the presence of this mutation would allow them to also take advantage of risk-reducing measures.   SUPPORT AND RESOURCES: If Krista [name] is interested in BRCA-specific information and support, there are two groups, Facing Our Risk (www.facingourrisk.com) and Bright Pink (www.brightpink.org) which some people have found useful. They provide opportunities to speak with other individuals from high-risk families. To locate genetic counselors in other cities, visit the website of the [employer] (Artist[employer].se) and Secretary/administrator for a Social worker by zip code.  We encouraged Krista [name] to remain in contact with [place] on an annual basis so we can update her personal and family histories, and let her know of advances in cancer genetics that may benefit the family. Our contact number was provided. Krista [name]'s questions were answered to her  satisfaction today, and she knows she is welcome to call anytime with additional questions.   Clint Guy, [place], [facility] Licensed, Certified Dispensing [email or web address] phone: [phone]  The patient was seen for a total of 20 minutes in face-to-face genetic counseling, with an additional 30 minutes of discussion completed over the phone.

## 2019-12-12 NOTE — Progress Notes (Signed)
[place]  [phone] [phone]     ID: Krista [name] DOB: [birth year]  MR#: [id number]  MOQ#:[id number]  Patient Care Team: Patient, No Pcp Per as PCP - General (General Practice) Krista [name], Krista Dad, MD as Consulting Physician (Oncology) Krista Luna, MD as Consulting Physician (General Surgery) Eppie Gibson, MD as Attending Physician (Radiation Oncology) Rockwell Germany, RN as Oncology Nurse Navigator Mauro Kaufmann, RN as Oncology Nurse Navigator Chauncey Cruel, MD OTHER MD:  CHIEF COMPLAINT: Triple negative breast cancer  CURRENT TREATMENT: Neoadjuvant chemotherapy   INTERVAL HISTORY: Krista [name] returns today for follow up and treatment of her triple negative breast cancer accompanied by her daughter.  Krista [name] started on neoadjuvant chemotherapy, consisting of doxorubicin and cyclophosphamide, on 11/16/2019. Today is day 1 cycle 3.  She has tolerated treatment well so far, requiring no dose reductions or delays.  She does receive lorazepam together with her premeds and that made her a little woozy during our meeting today.  REVIEW OF SYSTEMS: Krista [name] is still able to work right through her chemo.  She is seeing fewer patients and mostly virtually.  She is trying to exercise regularly.  A day or 2 after chemo she feels very tired and "takes it easy".  She does not have bony aches and pains from the Neulasta shot and is not taking Claritin to prevent that.  Nausea and vomiting have not been an issue and she has had no problems with her port, no mouth sores, and no diarrhea or constipation issues.  A detailed review of systems today was stable   HISTORY OF CURRENT ILLNESS: From the original intake note:  Krista [name]"] herself palpated a lump in the upper-inner right breast sometime late December 2020 or early January 2021.  As the mass grew larger and persisted through menstrual periods she  brought it to medical attention and underwent bilateral diagnostic mammography with tomography and bilateral breast ultrasonography at [facility] on 09/26/2019 showing: breast density category C; 5.1 cm mass involving upper-inner quadrant of right breast; no evidence of right axillary lymphadenopathy or left breast malignancy.  Accordingly on 10/03/2019 she proceeded to biopsy of the right breast area in question. The pathology from this procedure ([id number]) showed: invasive ductal carcinoma, grade 3. Prognostic indicators significant for: estrogen receptor, 0% negative and progesterone receptor, 0% negative. Proliferation marker Ki67 at 80%. HER2 negative by immunohistochemistry (1+).  The patient's subsequent history is as detailed below.   PAST MEDICAL HISTORY: Past Medical History:  Diagnosis Date  . Anxiety   . Cancer ([place])   . Family history of breast cancer   . Family history of cervical cancer   . Family history of lung cancer   . Hemorrhoids     PAST SURGICAL HISTORY: No prior surgeries   FAMILY HISTORY: Family History  Problem Relation Age of Onset  . Breast cancer Mother 28       double mastectomy  . Breast cancer Half-Sister 16  . Cirrhosis Half-Sister   . Heart Problems Father   . Stroke Father   . Breast cancer Maternal Aunt 46  . Cancer Maternal Uncle 18       unknown type  . Ovarian cancer Maternal Grandmother        dx. in her mid-40s  . Lung cancer Paternal Grandmother   . Cancer Other        unknown type; maternal great-uncles/aunts (x3)  .  Diabetes Half-Brother   . Cancer Other        unknown type; matenral great-uncles/aunt (x4)  . Cancer Maternal Great-grandfather        unknown type  . Breast cancer Cousin 59       paternal cousin  The patient's father died at age 29 from cardiac complications of drug use.  His mother, the patient's paternal grandmother had what seems to have been cancer of the lung metastatic to the spine.  There was no  other cancer on his side of the family to the patient's knowledge.  The patient's mother died at age 34 in the setting of multiple medical issues but she had undergone double mastectomy at the age of 89.  Her mother, the patient's maternal grandmother had cervical cancer.  The patient's mother had 3 brothers and 3 sisters.  1 of those 3 sisters had breast cancer diagnosed at the age of 44.  The patient herself has 2 brothers and 3 sisters one of her sisters was diagnosed with breast cancer at the age of 59 and died at the age of 82   GYNECOLOGIC HISTORY:  Patient's last menstrual period was 11/23/2019 (exact date). Menarche: 44 years old Age at first live birth: 44 years old [place] P 3 LMP regular, last approximately 5 days, of which the second day is heavy Contraceptive: Husband status post vasectomy HRT no  Hysterectomy? no BSO?  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  Her husband Krista [name] is a Research scientist (medical) and also Librarian, academic at [employer].  Their children are Krista [name], 20, who is Scientist, product/process development at [employer], 18, who will be going to [employer] this coming year and then switch over to [employer] and aiming for an art rehabilitation degree; and Krista [name], 40, currently attending [employer].  The patient is not a church attender    ADVANCED DIRECTIVES: In the absence of any documents to the contrary the patient's husband is her healthcare power of attorney   HEALTH MAINTENANCE: Social History   Tobacco Use  . Smoking status: Never Smoker  . Smokeless tobacco: Never Used  Substance Use Topics  . Alcohol use: Not Currently    Comment: Social drinker  . Drug use: Never     Colonoscopy: n/a (age)  PAP: UTD  Bone density: n/a (age)   Allergies  Allergen Reactions  . Emend [Fosaprepitant] Shortness Of Breath    Current Outpatient Medications  Medication Sig Dispense Refill  .  acetaminophen (TYLENOL) 500 MG tablet Take 1-2 tablets (500-1,000 mg total) by mouth every 6 (six) hours as needed. (Patient taking differently: Take 500-1,000 mg by mouth every 6 (six) hours as needed for moderate pain or fever. ) 60 tablet 0  . desloratadine (CLARINEX) 5 MG tablet Take 5 mg by mouth daily.    Marland Kitchen dexamethasone (DECADRON) 4 MG tablet Take 2 tablets by mouth daily starting the day after Cytoxan x 3 days. Take with food. (Patient taking differently: Take 8 mg by mouth daily. ) 30 tablet 1  . lidocaine-prilocaine (EMLA) cream Apply to affected area once 30 g 3  . loratadine (CLARITIN) 10 MG tablet Take 1 tablet (10 mg total) by mouth daily. 60 tablet 0  . LORazepam (ATIVAN) 0.5 MG tablet Take 1 tablet (0.5 mg total) by mouth at bedtime as needed (Nausea or vomiting). 30 tablet 0  . Multiple Vitamins-Minerals (MULTIVITAMIN WITH  MINERALS) tablet Take 2 tablets by mouth daily.    . prochlorperazine (COMPAZINE) 10 MG tablet Take 1 tablet (10 mg total) by mouth every 6 (six) hours as needed (Nausea or vomiting). 30 tablet 1  . valACYclovir (VALTREX) 1000 MG tablet Take 1 tablet (1,000 mg total) by mouth daily. 30 tablet 1   No current facility-administered medications for this visit.   Facility-Administered Medications Ordered in Other Visits  Medication Dose Route Frequency Provider Last Rate Last Admin  . sodium chloride flush (NS) 0.9 % injection 10 mL  10 mL Intracatheter PRN Malloree Raboin, Krista Dad, MD   10 mL at 12/13/19 1653    OBJECTIVE: African-American woman examined in a recliner  There were no vitals filed for this visit.   There is no height or weight on file to calculate BMI.   Wt Readings from Last 3 Encounters:  12/13/19 210 lb 4 oz (95.4 kg)  12/06/19 209 lb 12.8 oz (95.2 kg)  11/23/19 212 lb (96.2 kg)  For vitals on the December 13, 2019 visit please see the infusion area flowsheet    ECOG FS:1 - Symptomatic but completely ambulatory  Sclerae unicteric, EOMs  intact Wearing a mask No cervical or supraclavicular adenopathy Lungs no rales or rhonchi Heart regular rate and rhythm Abd soft, nontender, positive bowel sounds MSK no focal spinal tenderness, no upper extremity lymphedema Neuro: nonfocal, well oriented, appropriate affect Breasts: Deferred  LAB RESULTS:  CMP     Component Value Date/Time   NA 142 12/13/2019 1243   K 3.9 12/13/2019 1243   CL 108 12/13/2019 1243   CO2 26 12/13/2019 1243   GLUCOSE 106 (H) 12/13/2019 1243   BUN 10 12/13/2019 1243   CREATININE 0.77 12/13/2019 1243   CREATININE 0.95 10/17/2019 1529   CALCIUM 9.0 12/13/2019 1243   PROT 7.0 12/13/2019 1243   ALBUMIN 3.6 12/13/2019 1243   AST 12 (L) 12/13/2019 1243   AST 16 10/17/2019 1529   ALT 10 12/13/2019 1243   ALT 13 10/17/2019 1529   ALKPHOS 92 12/13/2019 1243   BILITOT 0.4 12/13/2019 1243   BILITOT 1.1 10/17/2019 1529   GFRNONAA >60 12/13/2019 1243   GFRNONAA >60 10/17/2019 1529   GFRAA >60 12/13/2019 1243   GFRAA >60 10/17/2019 1529    No results found for: TOTALPROTELP, ALBUMINELP, A1GS, A2GS, BETS, BETA2SER, GAMS, MSPIKE, SPEI  Lab Results  Component Value Date   WBC 6.4 12/13/2019   NEUTROABS 4.1 12/13/2019   HGB 10.5 (L) 12/13/2019   HCT 30.3 (L) 12/13/2019   MCV 71.5 (L) 12/13/2019   PLT 299 12/13/2019    No results found for: LABCA2  No components found for: AVWPVX480  No results for input(s): INR in the last 168 hours.  No results found for: LABCA2  No results found for: XKP537  No results found for: SMO707  No results found for: EML544  Lab Results  Component Value Date   CA2729 29.8 11/16/2019    No components found for: HGQUANT  No results found for: CEA1 / No results found for: CEA1   No results found for: AFPTUMOR  No results found for: CHROMOGRNA  No results found for: KPAFRELGTCHN, LAMBDASER, KAPLAMBRATIO (kappa/lambda light chains)  No results found for: HGBA, HGBA2QUANT, HGBFQUANT,  HGBSQUAN (Hemoglobinopathy evaluation)   No results found for: LDH  Lab Results  Component Value Date   IRON 99 10/17/2019   TIBC 391 10/17/2019   IRONPCTSAT 25 10/17/2019   (Iron and TIBC)  Lab Results  Component Value Date   FERRITIN 45 10/17/2019    Urinalysis No results found for: COLORURINE, APPEARANCEUR, LABSPEC, PHURINE, GLUCOSEU, HGBUR, BILIRUBINUR, KETONESUR, PROTEINUR, UROBILINOGEN, NITRITE, LEUKOCYTESUR   STUDIES: US BIOPSY (LIVER)  Result Date: 11/23/2019 INDICATION: Right-sided breast cancer and solitary lesion in the left lobe of the liver potentially representing metastatic disease. The patient presents for biopsy. EXAM: ULTRASOUND GUIDED CORE BIOPSY OF LIVER MASS MEDICATIONS: None. ANESTHESIA/SEDATION: Fentanyl 100 mcg IV; Versed 2.0 mg IV Moderate Sedation Time:  16 minutes. The patient was continuously monitored during the procedure by the interventional radiology nurse under my direct supervision. PROCEDURE: The procedure, risks, benefits, and alternatives were explained to the patient. Questions regarding the procedure were encouraged and answered. The patient understands and consents to the procedure. A time-out was performed prior to initiating the procedure. Ultrasound was performed to isolate a lesion within the left lobe of the liver. The abdominal wall was prepped with chlorhexidine in a sterile fashion, and a sterile drape was applied covering the operative field. A sterile gown and sterile gloves were used for the procedure. Local anesthesia was provided with 1% Lidocaine. Under ultrasound guidance, a 17 gauge needle was advanced to the level of the left lobe of the liver. Two separate coaxial 18 gauge core biopsy samples were obtained through a lesion in the left lobe. Samples were submitted in formalin. Gel-Foam pledgets were advanced through the outer needle as the outer needle was removed. Additional ultrasound was performed. COMPLICATIONS: None immediate.  FINDINGS: Rounded hypoechoic solid mass within the left lobe of the liver adjacent to the falciform ligament corresponds to the lesion seen by MRI. This measures approximately 1.9 x 1.7 x 1.9 cm by ultrasound. Solid tissue was obtained. IMPRESSION: Ultrasound-guided core biopsy performed of a 1.9 cm lesion within the left lobe of the liver. Electronically Signed   By: Aletta Edouard M.D.   On: 11/23/2019 16:46     ELIGIBLE FOR AVAILABLE RESEARCH PROTOCOL: Z6109  ASSESSMENT: 44 y.o. [place] woman status post right breast upper inner quadrant biopsy 10/03/2019 for a clinical mT3 N1, stage IIIC invasive ductal carcinoma, grade 3, triple negative, with an MIB-1 of 80%  (a) biopsy of a right axillary node 2019-11-09 showed carcinoma  (b) CT chest/abd/pelvis 10/31/2019 shows 1.5 cm hypodense lesion in liver, lung nodules <0.3 cm  (c) bone scan 10/31/2019 negative  (d) liver MRI 11/11/2019 confirms a targetoid lesion in the left hepatic lobe suspicious for metastasis   (i) liver biopsy 2019-11-23 showed no evidence of malignancy (benign).  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Gly84Glu), HRAS, KIT, MAX, MEN1, MET, MITF (c.952G>A, p.Glu318Lys variant only), MLH1, MSH2, MSH3,  MSH6, MUTYH, NBN, NF1, NF2, NTHL1, PALB2, PDGFRA, PHOX2B, PMS2, POLD1, POLE, POT1, PRKAR1A, PTCH1, PTEN, RAD50, RAD51C, RAD51D, RB1, RECQL4, RET, RNF43, RUNX1, SDHAF2, SDHA (sequence changes only), SDHB, SDHC, SDHD, SMAD4, SMARCA4, SMARCB1, SMARCE1, STK11,  SUFU, TERC, TERT, TMEM127, TP53, TSC1, TSC2, VHL, WRN and WT1.  (2) neoadjuvant chemotherapy consisting of cyclophosphamide and doxorubicin in dose dense fashion x4 started 2019-11-16, to be followed by paclitaxel and carboplatin weekly x12  (a) echo 10/24/2019 shows an ejection fraction in the 60-65% range  (3) definitive surgery to follow  (4) adjuvant radiation  (5) hemoglobin C trait:  (a) ferritin 10/17/2019 was 45 with saturation 25%, hemoglobin 11.8 and MCV 71.5  (b) hemoglobin electrophoresis 2019-11-22 showed: A: 62.6%, A2: 3.7%, C: 33.2%, F:0 5.1%, S: 0%   PLAN: Krista [name] is receiving her third of 4 planned doses of intensive chemotherapy with cyclophosphamide and doxorubicin today.  So far she is tolerating it generally well.  We are not seeing her on day 7 because her counts have been excellent and she has had no side effects.  She is scheduled for her last AC dose on December 27, 2019.  She is I think mistakenly scheduled for an infusion on July 6.  Instead we will start her Taxol/carbo treatments on July 13 and she will see me July 9 to discuss those treatments.  She knows to call for any other issue that may develop before then  Total encounter time 25 minutes.Sarajane Jews C. Arnav Cregg, MD 12/13/2019 5:35 PM Medical Oncology and Hematology [facility] [place], [place] [address] Tel. [phone]    Fax. [phone]   This document serves as a record of services personally performed by Lurline Del, MD. It was created on his behalf by Wilburn Mylar, a trained medical scribe. The creation of this record is based on the scribe's personal observations and the provider's statements to them.    I, Lurline Del MD, have reviewed the above documentation for accuracy and completeness, and I agree with the above.   *Total Encounter Time as defined by the Centers for Medicare and Medicaid Services includes, in addition to the face-to-face time of a patient visit (documented in the note above) non-face-to-face time: obtaining and reviewing outside history, ordering and reviewing medications, tests or procedures, care coordination (communications with other health care professionals or caregivers) and documentation in the medical record.

## 2019-12-13 ENCOUNTER — Encounter: Payer: Self-pay | Admitting: *Deleted

## 2019-12-13 ENCOUNTER — Inpatient Hospital Stay: Payer: BC Managed Care – PPO

## 2019-12-13 ENCOUNTER — Inpatient Hospital Stay (HOSPITAL_BASED_OUTPATIENT_CLINIC_OR_DEPARTMENT_OTHER): Payer: BC Managed Care – PPO | Admitting: Oncology

## 2019-12-13 ENCOUNTER — Other Ambulatory Visit: Payer: Self-pay

## 2019-12-13 VITALS — BP 147/101 | HR 82 | Temp 98.4°F | Resp 18 | Wt 210.2 lb

## 2019-12-13 DIAGNOSIS — C50211 Malignant neoplasm of upper-inner quadrant of right female breast: Secondary | ICD-10-CM

## 2019-12-13 DIAGNOSIS — Z1509 Genetic susceptibility to other malignant neoplasm: Secondary | ICD-10-CM

## 2019-12-13 DIAGNOSIS — D563 Thalassemia minor: Secondary | ICD-10-CM

## 2019-12-13 DIAGNOSIS — Z1501 Genetic susceptibility to malignant neoplasm of breast: Secondary | ICD-10-CM

## 2019-12-13 DIAGNOSIS — C50911 Malignant neoplasm of unspecified site of right female breast: Secondary | ICD-10-CM | POA: Diagnosis not present

## 2019-12-13 DIAGNOSIS — Z171 Estrogen receptor negative status [ER-]: Secondary | ICD-10-CM

## 2019-12-13 DIAGNOSIS — C50411 Malignant neoplasm of upper-outer quadrant of right female breast: Secondary | ICD-10-CM

## 2019-12-13 LAB — CBC WITH DIFFERENTIAL/PLATELET
Abs Immature Granulocytes: 0.34 10*3/uL — ABNORMAL HIGH (ref 0.00–0.07)
Basophils Absolute: 0.1 10*3/uL (ref 0.0–0.1)
Basophils Relative: 1 %
Eosinophils Absolute: 0 10*3/uL (ref 0.0–0.5)
Eosinophils Relative: 0 %
HCT: 30.3 % — ABNORMAL LOW (ref 36.0–46.0)
Hemoglobin: 10.5 g/dL — ABNORMAL LOW (ref 12.0–15.0)
Immature Granulocytes: 5 %
Lymphocytes Relative: 21 %
Lymphs Abs: 1.3 10*3/uL (ref 0.7–4.0)
MCH: 24.8 pg — ABNORMAL LOW (ref 26.0–34.0)
MCHC: 34.7 g/dL (ref 30.0–36.0)
MCV: 71.5 fL — ABNORMAL LOW (ref 80.0–100.0)
Monocytes Absolute: 0.6 10*3/uL (ref 0.1–1.0)
Monocytes Relative: 9 %
Neutro Abs: 4.1 10*3/uL (ref 1.7–7.7)
Neutrophils Relative %: 64 %
Platelets: 299 10*3/uL (ref 150–400)
RBC: 4.24 MIL/uL (ref 3.87–5.11)
RDW: 17.9 % — ABNORMAL HIGH (ref 11.5–15.5)
WBC: 6.4 10*3/uL (ref 4.0–10.5)
nRBC: 0.5 % — ABNORMAL HIGH (ref 0.0–0.2)

## 2019-12-13 LAB — COMPREHENSIVE METABOLIC PANEL
ALT: 10 U/L (ref 0–44)
AST: 12 U/L — ABNORMAL LOW (ref 15–41)
Albumin: 3.6 g/dL (ref 3.5–5.0)
Alkaline Phosphatase: 92 U/L (ref 38–126)
Anion gap: 8 (ref 5–15)
BUN: 10 mg/dL (ref 6–20)
CO2: 26 mmol/L (ref 22–32)
Calcium: 9 mg/dL (ref 8.9–10.3)
Chloride: 108 mmol/L (ref 98–111)
Creatinine, Ser: 0.77 mg/dL (ref 0.44–1.00)
GFR calc Af Amer: >60 mL/min (ref >60)
GFR calc non Af Amer: >60 mL/min (ref >60)
Glucose, Bld: 106 mg/dL — ABNORMAL HIGH (ref 70–99)
Potassium: 3.9 mmol/L (ref 3.5–5.1)
Sodium: 142 mmol/L (ref 135–145)
Total Bilirubin: 0.4 mg/dL (ref 0.3–1.2)
Total Protein: 7 g/dL (ref 6.5–8.1)

## 2019-12-13 MED ORDER — PROCHLORPERAZINE MALEATE 10 MG PO TABS
ORAL_TABLET | ORAL | Status: AC
  Filled 2019-12-13: qty 1

## 2019-12-13 MED ORDER — PALONOSETRON HCL INJECTION 0.25 MG/5ML
INTRAVENOUS | Status: AC
  Filled 2019-12-13: qty 5

## 2019-12-13 MED ORDER — HEPARIN SOD (PORK) LOCK FLUSH 100 UNIT/ML IV SOLN
500.0000 [IU] | Freq: Once | INTRAVENOUS | Status: AC | PRN
  Administered 2019-12-13: 500 [IU]
  Filled 2019-12-13: qty 5

## 2019-12-13 MED ORDER — LORAZEPAM 2 MG/ML IJ SOLN
0.5000 mg | Freq: Once | INTRAMUSCULAR | Status: AC
  Administered 2019-12-13: 0.5 mg via INTRAVENOUS

## 2019-12-13 MED ORDER — SODIUM CHLORIDE 0.9 % IV SOLN
10.0000 mg | Freq: Once | INTRAVENOUS | Status: AC
  Administered 2019-12-13: 10 mg via INTRAVENOUS
  Filled 2019-12-13: qty 10

## 2019-12-13 MED ORDER — SODIUM CHLORIDE 0.9 % IV SOLN
600.0000 mg/m2 | Freq: Once | INTRAVENOUS | Status: AC
  Administered 2019-12-13: 1200 mg via INTRAVENOUS
  Filled 2019-12-13: qty 60

## 2019-12-13 MED ORDER — SODIUM CHLORIDE 0.9 % IV SOLN
Freq: Once | INTRAVENOUS | Status: AC
  Filled 2019-12-13: qty 250

## 2019-12-13 MED ORDER — LORAZEPAM 2 MG/ML IJ SOLN
INTRAMUSCULAR | Status: AC
  Filled 2019-12-13: qty 1

## 2019-12-13 MED ORDER — SODIUM CHLORIDE 0.9% FLUSH
10.0000 mL | INTRAVENOUS | Status: DC | PRN
  Administered 2019-12-13: 10 mL
  Filled 2019-12-13: qty 10

## 2019-12-13 MED ORDER — DOXORUBICIN HCL CHEMO IV INJECTION 2 MG/ML
60.0000 mg/m2 | Freq: Once | INTRAVENOUS | Status: AC
  Administered 2019-12-13: 120 mg via INTRAVENOUS
  Filled 2019-12-13: qty 60

## 2019-12-13 MED ORDER — PROCHLORPERAZINE MALEATE 10 MG PO TABS
10.0000 mg | ORAL_TABLET | Freq: Once | ORAL | Status: AC
  Administered 2019-12-13: 10 mg via ORAL

## 2019-12-13 MED ORDER — PALONOSETRON HCL INJECTION 0.25 MG/5ML
0.2500 mg | Freq: Once | INTRAVENOUS | Status: AC
  Administered 2019-12-13: 0.25 mg via INTRAVENOUS

## 2019-12-13 NOTE — Patient Instructions (Signed)
[facility] Discharge Instructions for Patients Receiving Chemotherapy  Today you received the following chemotherapy agents Doxorubicin (ADRIAMYCIN) & Cyclophosphamide (CYTOXAN).  To help prevent nausea and vomiting after your treatment, we encourage you to take your nausea medication as prescribed.   If you develop nausea and vomiting that is not controlled by your nausea medication, call the clinic.   BELOW ARE SYMPTOMS THAT SHOULD BE REPORTED IMMEDIATELY:  *FEVER GREATER THAN 100.5 F  *CHILLS WITH OR WITHOUT FEVER  NAUSEA AND VOMITING THAT IS NOT CONTROLLED WITH YOUR NAUSEA MEDICATION  *UNUSUAL SHORTNESS OF BREATH  *UNUSUAL BRUISING OR BLEEDING  TENDERNESS IN MOUTH AND THROAT WITH OR WITHOUT PRESENCE OF ULCERS  *URINARY PROBLEMS  *BOWEL PROBLEMS  UNUSUAL RASH Items with * indicate a potential emergency and should be followed up as soon as possible.  Feel free to call the clinic should you have any questions or concerns. The clinic phone number is [phone].  Please show the CHEMO ALERT CARD at check-in to the Emergency Department and triage nurse.   

## 2019-12-14 ENCOUNTER — Telehealth: Payer: Self-pay | Admitting: Oncology

## 2019-12-14 NOTE — Telephone Encounter (Signed)
Cancelled appts and Scheduled appts per 6/15 los. Left voicemail with cancellation info as well as new appt info.

## 2019-12-15 ENCOUNTER — Encounter: Payer: Self-pay | Admitting: Oncology

## 2019-12-15 ENCOUNTER — Inpatient Hospital Stay: Payer: BC Managed Care – PPO

## 2019-12-15 ENCOUNTER — Other Ambulatory Visit: Payer: Self-pay

## 2019-12-15 VITALS — BP 103/74 | Temp 98.7°F | Resp 88

## 2019-12-15 DIAGNOSIS — Z171 Estrogen receptor negative status [ER-]: Secondary | ICD-10-CM

## 2019-12-15 DIAGNOSIS — C50211 Malignant neoplasm of upper-inner quadrant of right female breast: Secondary | ICD-10-CM

## 2019-12-15 MED ORDER — PEGFILGRASTIM-JMDB 6 MG/0.6ML ~~LOC~~ SOSY
PREFILLED_SYRINGE | SUBCUTANEOUS | Status: AC
  Filled 2019-12-15: qty 0.6

## 2019-12-15 MED ORDER — PEGFILGRASTIM-JMDB 6 MG/0.6ML ~~LOC~~ SOSY
6.0000 mg | PREFILLED_SYRINGE | Freq: Once | SUBCUTANEOUS | Status: AC
  Administered 2019-12-15: 6 mg via SUBCUTANEOUS

## 2019-12-15 NOTE — Patient Instructions (Signed)

## 2019-12-20 ENCOUNTER — Other Ambulatory Visit: Payer: BC Managed Care – PPO

## 2019-12-20 ENCOUNTER — Ambulatory Visit: Payer: BC Managed Care – PPO

## 2019-12-22 ENCOUNTER — Ambulatory Visit: Payer: BC Managed Care – PPO

## 2019-12-27 ENCOUNTER — Other Ambulatory Visit: Payer: BC Managed Care – PPO

## 2019-12-27 ENCOUNTER — Other Ambulatory Visit: Payer: Self-pay

## 2019-12-27 ENCOUNTER — Inpatient Hospital Stay: Payer: BC Managed Care – PPO

## 2019-12-27 ENCOUNTER — Inpatient Hospital Stay (HOSPITAL_BASED_OUTPATIENT_CLINIC_OR_DEPARTMENT_OTHER): Payer: BC Managed Care – PPO | Admitting: Adult Health

## 2019-12-27 ENCOUNTER — Ambulatory Visit: Payer: BC Managed Care – PPO

## 2019-12-27 ENCOUNTER — Encounter: Payer: Self-pay | Admitting: Adult Health

## 2019-12-27 VITALS — BP 139/96 | HR 91 | Temp 98.0°F | Resp 18 | Ht 67.0 in | Wt 210.4 lb

## 2019-12-27 DIAGNOSIS — Z171 Estrogen receptor negative status [ER-]: Secondary | ICD-10-CM

## 2019-12-27 DIAGNOSIS — C50211 Malignant neoplasm of upper-inner quadrant of right female breast: Secondary | ICD-10-CM

## 2019-12-27 DIAGNOSIS — Z95828 Presence of other vascular implants and grafts: Secondary | ICD-10-CM

## 2019-12-27 DIAGNOSIS — D563 Thalassemia minor: Secondary | ICD-10-CM

## 2019-12-27 DIAGNOSIS — C50411 Malignant neoplasm of upper-outer quadrant of right female breast: Secondary | ICD-10-CM

## 2019-12-27 LAB — CBC WITH DIFFERENTIAL/PLATELET
Abs Immature Granulocytes: 0.54 10*3/uL — ABNORMAL HIGH (ref 0.00–0.07)
Basophils Absolute: 0.1 10*3/uL (ref 0.0–0.1)
Basophils Relative: 1 %
Eosinophils Absolute: 0 10*3/uL (ref 0.0–0.5)
Eosinophils Relative: 0 %
HCT: 30.4 % — ABNORMAL LOW (ref 36.0–46.0)
Hemoglobin: 10.6 g/dL — ABNORMAL LOW (ref 12.0–15.0)
Immature Granulocytes: 8 %
Lymphocytes Relative: 15 %
Lymphs Abs: 1.1 10*3/uL (ref 0.7–4.0)
MCH: 25.5 pg — ABNORMAL LOW (ref 26.0–34.0)
MCHC: 34.9 g/dL (ref 30.0–36.0)
MCV: 73.1 fL — ABNORMAL LOW (ref 80.0–100.0)
Monocytes Absolute: 0.7 10*3/uL (ref 0.1–1.0)
Monocytes Relative: 10 %
Neutro Abs: 4.8 10*3/uL (ref 1.7–7.7)
Neutrophils Relative %: 66 %
Platelets: 193 10*3/uL (ref 150–400)
RBC: 4.16 MIL/uL (ref 3.87–5.11)
RDW: 19.8 % — ABNORMAL HIGH (ref 11.5–15.5)
WBC: 7.2 10*3/uL (ref 4.0–10.5)
nRBC: 0.8 % — ABNORMAL HIGH (ref 0.0–0.2)

## 2019-12-27 LAB — COMPREHENSIVE METABOLIC PANEL
ALT: 11 U/L (ref 0–44)
AST: 15 U/L (ref 15–41)
Albumin: 3.7 g/dL (ref 3.5–5.0)
Alkaline Phosphatase: 102 U/L (ref 38–126)
Anion gap: 8 (ref 5–15)
BUN: 10 mg/dL (ref 6–20)
CO2: 26 mmol/L (ref 22–32)
Calcium: 9.4 mg/dL (ref 8.9–10.3)
Chloride: 107 mmol/L (ref 98–111)
Creatinine, Ser: 0.76 mg/dL (ref 0.44–1.00)
GFR calc Af Amer: >60 mL/min (ref >60)
GFR calc non Af Amer: >60 mL/min (ref >60)
Glucose, Bld: 116 mg/dL — ABNORMAL HIGH (ref 70–99)
Potassium: 3.7 mmol/L (ref 3.5–5.1)
Sodium: 141 mmol/L (ref 135–145)
Total Bilirubin: 0.4 mg/dL (ref 0.3–1.2)
Total Protein: 7.1 g/dL (ref 6.5–8.1)

## 2019-12-27 MED ORDER — LORAZEPAM 2 MG/ML IJ SOLN
0.5000 mg | Freq: Once | INTRAMUSCULAR | Status: AC
  Administered 2019-12-27: 0.5 mg via INTRAVENOUS

## 2019-12-27 MED ORDER — SODIUM CHLORIDE 0.9 % IV SOLN
Freq: Once | INTRAVENOUS | Status: AC
  Filled 2019-12-27: qty 250

## 2019-12-27 MED ORDER — DOXORUBICIN HCL CHEMO IV INJECTION 2 MG/ML
60.0000 mg/m2 | Freq: Once | INTRAVENOUS | Status: AC
  Administered 2019-12-27: 120 mg via INTRAVENOUS
  Filled 2019-12-27: qty 60

## 2019-12-27 MED ORDER — HEPARIN SOD (PORK) LOCK FLUSH 100 UNIT/ML IV SOLN
500.0000 [IU] | Freq: Once | INTRAVENOUS | Status: AC | PRN
  Administered 2019-12-27: 500 [IU]
  Filled 2019-12-27: qty 5

## 2019-12-27 MED ORDER — PALONOSETRON HCL INJECTION 0.25 MG/5ML
0.2500 mg | Freq: Once | INTRAVENOUS | Status: AC
  Administered 2019-12-27: 0.25 mg via INTRAVENOUS

## 2019-12-27 MED ORDER — LORAZEPAM 2 MG/ML IJ SOLN
INTRAMUSCULAR | Status: AC
  Filled 2019-12-27: qty 1

## 2019-12-27 MED ORDER — PALONOSETRON HCL INJECTION 0.25 MG/5ML
INTRAVENOUS | Status: AC
  Filled 2019-12-27: qty 5

## 2019-12-27 MED ORDER — SODIUM CHLORIDE 0.9 % IV SOLN
600.0000 mg/m2 | Freq: Once | INTRAVENOUS | Status: AC
  Administered 2019-12-27: 1200 mg via INTRAVENOUS
  Filled 2019-12-27: qty 60

## 2019-12-27 MED ORDER — SODIUM CHLORIDE 0.9% FLUSH
10.0000 mL | INTRAVENOUS | Status: DC | PRN
  Administered 2019-12-27: 10 mL
  Filled 2019-12-27: qty 10

## 2019-12-27 MED ORDER — PROCHLORPERAZINE MALEATE 10 MG PO TABS
10.0000 mg | ORAL_TABLET | Freq: Once | ORAL | Status: AC
  Administered 2019-12-27: 10 mg via ORAL

## 2019-12-27 MED ORDER — SODIUM CHLORIDE 0.9 % IV SOLN
10.0000 mg | Freq: Once | INTRAVENOUS | Status: AC
  Administered 2019-12-27: 10 mg via INTRAVENOUS
  Filled 2019-12-27: qty 10

## 2019-12-27 MED ORDER — PROCHLORPERAZINE MALEATE 10 MG PO TABS
ORAL_TABLET | ORAL | Status: AC
  Filled 2019-12-27: qty 1

## 2019-12-27 NOTE — Patient Instructions (Signed)
[facility] Discharge Instructions for Patients Receiving Chemotherapy  Today you received the following chemotherapy agents Adriamycin; Cytoxan  To help prevent nausea and vomiting after your treatment, we encourage you to take your nausea medication as directed   If you develop nausea and vomiting that is not controlled by your nausea medication, call the clinic.   BELOW ARE SYMPTOMS THAT SHOULD BE REPORTED IMMEDIATELY:  *FEVER GREATER THAN 100.5 F  *CHILLS WITH OR WITHOUT FEVER  NAUSEA AND VOMITING THAT IS NOT CONTROLLED WITH YOUR NAUSEA MEDICATION  *UNUSUAL SHORTNESS OF BREATH  *UNUSUAL BRUISING OR BLEEDING  TENDERNESS IN MOUTH AND THROAT WITH OR WITHOUT PRESENCE OF ULCERS  *URINARY PROBLEMS  *BOWEL PROBLEMS  UNUSUAL RASH Items with * indicate a potential emergency and should be followed up as soon as possible.  Feel free to call the clinic should you have any questions or concerns. The clinic phone number is [phone].  Please show the CHEMO ALERT CARD at check-in to the Emergency Department and triage nurse.   

## 2019-12-27 NOTE — Progress Notes (Signed)
[facility]  [phone] [phone]     ID: Krista [name] DOB: [birth year]  MR#: [id number]  ONG#:[id number]  Patient Care Team: Patient, No Pcp Per as PCP - General (General Practice) Magrinat, [name], MD as Consulting Physician (Oncology) Erroll Luna, MD as Consulting Physician (General Surgery) Eppie Gibson, MD as Attending Physician (Radiation Oncology) Rockwell Germany, RN as Oncology Nurse Navigator Mauro Kaufmann, RN as Oncology Nurse Navigator Scot Dock, NP OTHER MD:  CHIEF COMPLAINT: Triple negative breast cancer  CURRENT TREATMENT: Neoadjuvant chemotherapy   INTERVAL HISTORY: Krista [name] returns today for follow up and treatment of her triple negative breast cancer unaccompanied today.    Krista [name] is receiving neoadjuvant chemotherapy first with dose dense doxorubicin and cyclophosphamide given once every 14 days with Neulasta support on day 3 x 4 cycles.  She is here today to receive her final cycle of this treatment.   She had difficulty with her most recent treatment.  She has had increasing fatigue, the feeling of nausea and a sour stomach, mixed with feeling hungry, taste changes.  She also had some back pain, and recalls that she did not take claritin following her treatment last time.    She notes that her breast cancer has been shrinking since starting the chemotherapy.  REVIEW OF SYSTEMS: Krista [name] wants to hear more about her chemotherapy regimen, as she was under the impression that she was to receive 6 cycles of chemotherapy.  She is feeling improved today, and notes that when she can stay active, she is remaining active, and her weight has remained stable.  A detailed ROS was otherwise non contributory today.    HISTORY OF CURRENT ILLNESS: From the original intake note:  Krista [name] [facility] [name]"] herself palpated a lump in the upper-inner right breast sometime late December 2020 or  early January 2021.  As the mass grew larger and persisted through menstrual periods she brought it to medical attention and underwent bilateral diagnostic mammography with tomography and bilateral breast ultrasonography at [facility] on 09/26/2019 showing: breast density category C; 5.1 cm mass involving upper-inner quadrant of right breast; no evidence of right axillary lymphadenopathy or left breast malignancy.  Accordingly on 10/03/2019 she proceeded to biopsy of the right breast area in question. The pathology from this procedure ([id number]) showed: invasive ductal carcinoma, grade 3. Prognostic indicators significant for: estrogen receptor, 0% negative and progesterone receptor, 0% negative. Proliferation marker Ki67 at 80%. HER2 negative by immunohistochemistry (1+).  The patient's subsequent history is as detailed below.   PAST MEDICAL HISTORY: Past Medical History:  Diagnosis Date  . Anxiety   . Cancer ([place])   . Family history of breast cancer   . Family history of cervical cancer   . Family history of lung cancer   . Hemorrhoids     PAST SURGICAL HISTORY: No prior surgeries   FAMILY HISTORY: Family History  Problem Relation Age of Onset  . Breast cancer Mother 19       double mastectomy  . Breast cancer Half-Sister 53  . Cirrhosis Half-Sister   . Heart Problems Father   . Stroke Father   . Breast cancer Maternal Aunt 63  . Cancer Maternal Uncle 39       unknown type  . Ovarian cancer Maternal Grandmother        dx. in her mid-40s  . Lung cancer Paternal Grandmother   . Cancer Other  unknown type; maternal great-uncles/aunts (x3)  . Diabetes Half-Brother   . Cancer Other        unknown type; matenral great-uncles/aunt (x4)  . Cancer Maternal Great-grandfather        unknown type  . Breast cancer Cousin 92       paternal cousin  The patient's father died at age 14 from cardiac complications of drug use.  His mother, the patient's paternal grandmother  had what seems to have been cancer of the lung metastatic to the spine.  There was no other cancer on his side of the family to the patient's knowledge.  The patient's mother died at age 51 in the setting of multiple medical issues but she had undergone double mastectomy at the age of 23.  Her mother, the patient's maternal grandmother had cervical cancer.  The patient's mother had 3 brothers and 3 sisters.  1 of those 3 sisters had breast cancer diagnosed at the age of 84.  The patient herself has 2 brothers and 3 sisters one of her sisters was diagnosed with breast cancer at the age of 58 and died at the age of 80   GYNECOLOGIC HISTORY:  No LMP recorded. Menarche: 44 years old Age at first live birth: 44 years old [place] P 3 LMP regular, last approximately 5 days, of which the second day is heavy Contraceptive: Husband status post vasectomy HRT no  Hysterectomy? no BSO?  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  Her husband [name] is a Research scientist (medical) and also Librarian, academic at [employer].  Their children are Rowe Pavy, 20, who is Scientist, product/process development at [employer], 18, who will be going to [employer] this coming year and then switch over to [employer], [place] [employer] and aiming for an art rehabilitation degree; and Alanna, 66, currently attending [employer].  The patient is not a church attender    ADVANCED DIRECTIVES: In the absence of any documents to the contrary the patient's husband is her healthcare power of attorney   HEALTH MAINTENANCE: Social History   Tobacco Use  . Smoking status: Never Smoker  . Smokeless tobacco: Never Used  Substance Use Topics  . Alcohol use: Not Currently    Comment: Social drinker  . Drug use: Never     Colonoscopy: n/a (age)  PAP: UTD  Bone density: n/a (age)   Allergies  Allergen Reactions  . Emend [Fosaprepitant] Shortness Of Breath    Current Outpatient Medications    Medication Sig Dispense Refill  . acetaminophen (TYLENOL) 500 MG tablet Take 1-2 tablets (500-1,000 mg total) by mouth every 6 (six) hours as needed. (Patient taking differently: Take 500-1,000 mg by mouth every 6 (six) hours as needed for moderate pain or fever. ) 60 tablet 0  . desloratadine (CLARINEX) 5 MG tablet Take 5 mg by mouth daily.    Marland Kitchen dexamethasone (DECADRON) 4 MG tablet Take 2 tablets by mouth daily starting the day after Cytoxan x 3 days. Take with food. (Patient taking differently: Take 8 mg by mouth daily. ) 30 tablet 1  . lidocaine-prilocaine (EMLA) cream Apply to affected area once 30 g 3  . loratadine (CLARITIN) 10 MG tablet Take 1 tablet (10 mg total) by mouth daily. 60 tablet 0  . LORazepam (ATIVAN) 0.5 MG tablet Take 1 tablet (0.5 mg total) by mouth at bedtime as needed (Nausea or vomiting). 30 tablet 0  . Multiple  Vitamins-Minerals (MULTIVITAMIN WITH MINERALS) tablet Take 2 tablets by mouth daily.    . prochlorperazine (COMPAZINE) 10 MG tablet Take 1 tablet (10 mg total) by mouth every 6 (six) hours as needed (Nausea or vomiting). 30 tablet 1  . valACYclovir (VALTREX) 1000 MG tablet Take 1 tablet (1,000 mg total) by mouth daily. 30 tablet 1   No current facility-administered medications for this visit.    OBJECTIVE:   Vitals:   12/27/19 1152  BP: (!) 139/96  Pulse: 91  Resp: 18  Temp: 98 F (36.7 C)  SpO2: 100%     Body mass index is 32.95 kg/m.   Wt Readings from Last 3 Encounters:  12/27/19 210 lb 6.4 oz (95.4 kg)  12/13/19 210 lb 4 oz (95.4 kg)  12/06/19 209 lb 12.8 oz (95.2 kg)      ECOG FS:1 - Symptomatic but completely ambulatory GENERAL: Patient is a well appearing female in no acute distress HEENT:  Sclerae anicteric. Mask in place.. Neck is supple.  NODES:  No cervical, supraclavicular, or axillary lymphadenopathy palpated.  BREAST EXAM:  Deferred. LUNGS:  Clear to auscultation bilaterally.  No wheezes or rhonchi. HEART:  Regular rate and  rhythm. No murmur appreciated. ABDOMEN:  Soft, nontender.  Positive, normoactive bowel sounds. No organomegaly palpated. MSK:  No focal spinal tenderness to palpation. Full range of motion bilaterally in the upper extremities. EXTREMITIES:  No peripheral edema.   SKIN:  Clear with no obvious rashes or skin changes. No nail dyscrasia. NEURO:  Nonfocal. Well oriented.  Appropriate affect.    LAB RESULTS:  CMP     Component Value Date/Time   NA 142 12/13/2019 1243   K 3.9 12/13/2019 1243   CL 108 12/13/2019 1243   CO2 26 12/13/2019 1243   GLUCOSE 106 (H) 12/13/2019 1243   BUN 10 12/13/2019 1243   CREATININE 0.77 12/13/2019 1243   CREATININE 0.95 10/17/2019 1529   CALCIUM 9.0 12/13/2019 1243   PROT 7.0 12/13/2019 1243   ALBUMIN 3.6 12/13/2019 1243   AST 12 (L) 12/13/2019 1243   AST 16 10/17/2019 1529   ALT 10 12/13/2019 1243   ALT 13 10/17/2019 1529   ALKPHOS 92 12/13/2019 1243   BILITOT 0.4 12/13/2019 1243   BILITOT 1.1 10/17/2019 1529   GFRNONAA >60 12/13/2019 1243   GFRNONAA >60 10/17/2019 1529   GFRAA >60 12/13/2019 1243   GFRAA >60 10/17/2019 1529    No results found for: TOTALPROTELP, ALBUMINELP, A1GS, A2GS, BETS, BETA2SER, GAMS, MSPIKE, SPEI  Lab Results  Component Value Date   WBC 7.2 12/27/2019   NEUTROABS PENDING 12/27/2019   HGB 10.6 (L) 12/27/2019   HCT 30.4 (L) 12/27/2019   MCV 73.1 (L) 12/27/2019   PLT 193 12/27/2019    No results found for: LABCA2  No components found for: [id number]  No results for input(s): INR in the last 168 hours.  No results found for: LABCA2  No results found for: DUK025  No results found for: KYH062  No results found for: BJS283  Lab Results  Component Value Date   CA2729 29.8 11/16/2019    No components found for: HGQUANT  No results found for: CEA1 / No results found for: CEA1   No results found for: AFPTUMOR  No results found for: CHROMOGRNA  No results found for: KPAFRELGTCHN, LAMBDASER,  KAPLAMBRATIO (kappa/lambda light chains)  No results found for: HGBA, HGBA2QUANT, HGBFQUANT, HGBSQUAN (Hemoglobinopathy evaluation)   No results found for: LDH  Lab Results  Component Value  Date   IRON 99 10/17/2019   TIBC 391 10/17/2019   IRONPCTSAT 25 10/17/2019   (Iron and TIBC)  Lab Results  Component Value Date   FERRITIN 45 10/17/2019    Urinalysis No results found for: COLORURINE, APPEARANCEUR, LABSPEC, PHURINE, GLUCOSEU, HGBUR, BILIRUBINUR, KETONESUR, PROTEINUR, UROBILINOGEN, NITRITE, LEUKOCYTESUR   STUDIES: No results found.   ELIGIBLE FOR AVAILABLE RESEARCH PROTOCOL: X3818  ASSESSMENT: 44 y.o. [place] woman status post right breast upper inner quadrant biopsy 10/03/2019 for a clinical mT3 N1, stage IIIC invasive ductal carcinoma, grade 3, triple negative, with an MIB-1 of 80%  (a) biopsy of a right axillary node 2019-11-09 showed carcinoma  (b) CT chest/abd/pelvis 10/31/2019 shows 1.5 cm hypodense lesion in liver, lung nodules <0.3 cm  (c) bone scan 10/31/2019 negative  (d) liver MRI 11/11/2019 confirms a targetoid lesion in the left hepatic lobe suspicious for metastasis   (i) liver biopsy 2019-11-23 showed no evidence of malignancy (benign).  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)   (a) two variants of uncertain significance were noted - one in the [facility] gene called c.1295T>G and one in the POLD1 gene called c.34G>A  (b) no additional mutations of concern were found in AIP, ALK, APC, ATM, AXIN2,BAP1,  BARD1, BLM, BMPR1A, BRCA1, BRCA2, BRIP1, CASR, CDC73, CDH1, CDK4, CDKN1B, CDKN1C, CDKN2A (p14ARF), CDKN2A (p16INK4a), CEBPA, CHEK2, CTNNA1, DICER1, DIS3L2, EGFR (c.2369C>T, p.Thr790Met variant only), EPCAM (Deletion/duplication testing only), FH, FLCN, GATA2, GPC3, GREM1 (Promoter region  deletion/duplication testing only), HOXB13 (c.251G>A, p.Gly84Glu), HRAS, KIT, MAX, MEN1, MET, MITF (c.952G>A, p.Glu318Lys variant only), MLH1, MSH2, MSH3, MSH6, MUTYH, NBN, NF1, NF2, NTHL1, PALB2, PDGFRA, PHOX2B, PMS2, POLD1, POLE, POT1, PRKAR1A, PTCH1, PTEN, RAD50, RAD51C, RAD51D, RB1, RECQL4, RET, RNF43, RUNX1, SDHAF2, SDHA (sequence changes only), SDHB, SDHC, SDHD, SMAD4, SMARCA4, SMARCB1, SMARCE1, STK11, SUFU, TERC, TERT, TMEM127, TP53, TSC1, TSC2, VHL, WRN and WT1.  (2) neoadjuvant chemotherapy consisting of cyclophosphamide and doxorubicin in dose dense fashion x4 started 2019-11-16, to be followed by paclitaxel and carboplatin weekly x12  (a) echo 10/24/2019 shows an ejection fraction in the 60-65% range  (3) definitive surgery to follow  (4) adjuvant radiation  (5) hemoglobin C trait:  (a) ferritin 10/17/2019 was 45 with saturation 25%, hemoglobin 11.8 and MCV 71.5  (b) hemoglobin electrophoresis 2019-11-22 showed: A: 62.6%, A2: 3.7%, C: 33.2%, F:0 5.1%, S: 0%   PLAN: [name] continues on neoadjuvant chemotherapy and will complete her fourth cycle of Doxorubicin and Cyclophosphamide today.  She struggled with her most recent treatment.  I offered to have her come in for extra IV fluids or anti nausea medication next week.  She declined, other than IV fluids after her 7/9 appointment as anti nausea medications have made her feel disconnected and under the influence.    Krista [name] and I discussed her chemotherapy regimen, and that the next regimen entails weekly Paclitaxel and Carboplatin.  I noted that this regimen is typically easier to tolerate, and doesn't require the Neulasta.  We reviewed common adverse effects, and she will of course discuss this again with Dr. Jana Hakim when she sees him next week.  She and I reviewed the Neulasta injection and how it works to stimulate the bone marrow to produce WBCs.  She recalled that she didn't take claritin last week, and plans to do so with  this next cycle so that she can hopefully avoid  the discomfort.    I encouraged Krista [name] to continue, and to remain active, which she has done a great job at.  She will return on 7/9 for f/u with Dr. Jana Hakim.  She knows to call for any questions that may arise between now and her next appointment.  We are happy to see her sooner if needed.   Total encounter time 30 minutes.Wilber Bihari, NP 12/27/19 12:09 PM Medical Oncology and Hematology [facility] [place], [place] [address] Tel. [phone]    Fax. [phone]    *Total Encounter Time as defined by the Centers for Medicare and Medicaid Services includes, in addition to the face-to-face time of a patient visit (documented in the note above) non-face-to-face time: obtaining and reviewing outside history, ordering and reviewing medications, tests or procedures, care coordination (communications with other health care professionals or caregivers) and documentation in the medical record.

## 2019-12-28 ENCOUNTER — Telehealth: Payer: Self-pay | Admitting: Adult Health

## 2019-12-28 NOTE — Telephone Encounter (Signed)
Added IV hydration to appts that were already scheduled per 6/29 los.

## 2019-12-29 ENCOUNTER — Inpatient Hospital Stay: Payer: BC Managed Care – PPO | Attending: Oncology

## 2019-12-29 ENCOUNTER — Other Ambulatory Visit: Payer: Self-pay

## 2019-12-29 VITALS — BP 145/93 | HR 91 | Resp 18

## 2019-12-29 DIAGNOSIS — C50211 Malignant neoplasm of upper-inner quadrant of right female breast: Secondary | ICD-10-CM | POA: Diagnosis present

## 2019-12-29 DIAGNOSIS — Z171 Estrogen receptor negative status [ER-]: Secondary | ICD-10-CM

## 2019-12-29 DIAGNOSIS — Z5111 Encounter for antineoplastic chemotherapy: Secondary | ICD-10-CM | POA: Insufficient documentation

## 2019-12-29 DIAGNOSIS — Z5189 Encounter for other specified aftercare: Secondary | ICD-10-CM | POA: Insufficient documentation

## 2019-12-29 MED ORDER — PEGFILGRASTIM-JMDB 6 MG/0.6ML ~~LOC~~ SOSY
6.0000 mg | PREFILLED_SYRINGE | Freq: Once | SUBCUTANEOUS | Status: AC
  Administered 2019-12-29: 6 mg via SUBCUTANEOUS

## 2019-12-29 NOTE — Patient Instructions (Signed)

## 2020-01-03 ENCOUNTER — Other Ambulatory Visit: Payer: Self-pay | Admitting: Oncology

## 2020-01-03 ENCOUNTER — Other Ambulatory Visit: Payer: BC Managed Care – PPO

## 2020-01-03 ENCOUNTER — Encounter: Payer: Self-pay | Admitting: *Deleted

## 2020-01-03 ENCOUNTER — Ambulatory Visit: Payer: BC Managed Care – PPO

## 2020-01-05 NOTE — Progress Notes (Signed)
[place]  [phone] [phone]     ID: [name] DOB: Feb 01, 1976  MR#: [id number]  JYN#:[id number]  Patient Care Team: Patient, No Pcp Per as PCP - General (General Practice) Kharon Hixon, [name], MD as Consulting Physician (Oncology) Erroll Luna, MD as Consulting Physician (General Surgery) Eppie Gibson, MD as Attending Physician (Radiation Oncology) Rockwell Germany, RN as Oncology Nurse Navigator Mauro Kaufmann, RN as Oncology Nurse Navigator Chauncey Cruel, MD OTHER MD:  I connected with [name] Lowell Bouton on 01/06/20 at  2:00 PM EDT by telephone visit and verified that I am speaking with the correct person using two identifiers.   I discussed the limitations, risks, security and privacy concerns of performing an evaluation and management service by telemedicine and the availability of in-person appointments. I also discussed with the patient that there may be a patient responsible charge related to this service. The patient expressed understanding and agreed to proceed.   Other persons participating in the visit and their role in the encounter: none  Patient's location:  Provider's location:   CHIEF COMPLAINT: Triple negative breast cancer  CURRENT TREATMENT: Neoadjuvant chemotherapy   INTERVAL HISTORY: [name] had been scheduled for a visit today for follow up and treatment of her triple negative breast cancer.  However she is currently out of town. Today is day 11 from her fourth cycle of cyclophosphamide and doxorubicin.  She is ready to begin on weekly paclitaxel and carboplatin x12.  This is scheduled to begin 01/10/2020   REVIEW OF SYSTEMS: [name]    HISTORY OF CURRENT ILLNESS: From the original intake note:  [name] [name]"] herself palpated a lump in the upper-inner right breast sometime late December 2020 or early January 2021.  As the mass grew larger and  persisted through menstrual periods she brought it to medical attention and underwent bilateral diagnostic mammography with tomography and bilateral breast ultrasonography at [facility] on 09/26/2019 showing: breast density category C; 5.1 cm mass involving upper-inner quadrant of right breast; no evidence of right axillary lymphadenopathy or left breast malignancy.  Accordingly on 10/03/2019 she proceeded to biopsy of the right breast area in question. The pathology from this procedure ([id number]) showed: invasive ductal carcinoma, grade 3. Prognostic indicators significant for: estrogen receptor, 0% negative and progesterone receptor, 0% negative. Proliferation marker Ki67 at 80%. HER2 negative by immunohistochemistry (1+).  The patient's subsequent history is as detailed below.   PAST MEDICAL HISTORY: Past Medical History:  Diagnosis Date  . Anxiety   . Cancer ([place])   . Family history of breast cancer   . Family history of cervical cancer   . Family history of lung cancer   . Hemorrhoids     PAST SURGICAL HISTORY: No prior surgeries   FAMILY HISTORY: Family History  Problem Relation Age of Onset  . Breast cancer Mother 29       double mastectomy  . Breast cancer Half-Sister 74  . Cirrhosis Half-Sister   . Heart Problems Father   . Stroke Father   . Breast cancer Maternal Aunt 46  . Cancer Maternal Uncle 37       unknown type  . Ovarian cancer Maternal Grandmother        dx. in her mid-40s  . Lung cancer Paternal Grandmother   . Cancer Other        unknown type; maternal great-uncles/aunts (x3)  . Diabetes Half-Brother   .  Cancer Other        unknown type; matenral great-uncles/aunt (x4)  . Cancer Maternal Great-grandfather        unknown type  . Breast cancer Cousin 51       paternal cousin  The patient's father died at age 48 from cardiac complications of drug use.  His mother, the patient's paternal grandmother had what seems to have been cancer of the lung  metastatic to the spine.  There was no other cancer on his side of the family to the patient's knowledge.  The patient's mother died at age 71 in the setting of multiple medical issues but she had undergone double mastectomy at the age of 69.  Her mother, the patient's maternal grandmother had cervical cancer.  The patient's mother had 3 brothers and 3 sisters.  1 of those 3 sisters had breast cancer diagnosed at the age of 34.  The patient herself has 2 brothers and 3 sisters one of her sisters was diagnosed with breast cancer at the age of 4 and died at the age of 8   GYNECOLOGIC HISTORY:  No LMP recorded. Menarche: 44 years old Age at first live birth: 44 years old [place] P 3 LMP regular, last approximately 5 days, of which the second day is heavy Contraceptive: Husband status post vasectomy HRT no  Hysterectomy? no BSO?  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  Her husband [name] is a Research scientist (medical) and also Librarian, academic at [employer].  Their children are Rowe Pavy, 20, who is Scientist, product/process development at [employer], 18, who will be going to [employer] this coming year and then switch over to [employer], [employer] and aiming for an art rehabilitation degree; and Alanna, 51, currently attending [employer].  The patient is not a church attender    ADVANCED DIRECTIVES: In the absence of any documents to the contrary the patient's husband is her healthcare power of attorney   HEALTH MAINTENANCE: Social History   Tobacco Use  . Smoking status: Never Smoker  . Smokeless tobacco: Never Used  Substance Use Topics  . Alcohol use: Not Currently    Comment: Social drinker  . Drug use: Never     Colonoscopy: n/a (age)  PAP: UTD  Bone density: n/a (age)   Allergies  Allergen Reactions  . Emend [Fosaprepitant] Shortness Of Breath    Current Outpatient Medications  Medication Sig Dispense Refill  . acetaminophen  (TYLENOL) 500 MG tablet Take 1-2 tablets (500-1,000 mg total) by mouth every 6 (six) hours as needed. (Patient taking differently: Take 500-1,000 mg by mouth every 6 (six) hours as needed for moderate pain or fever. ) 60 tablet 0  . desloratadine (CLARINEX) 5 MG tablet Take 5 mg by mouth daily.    Marland Kitchen dexamethasone (DECADRON) 4 MG tablet Take 2 tablets by mouth daily starting the day after Cytoxan x 3 days. Take with food. (Patient taking differently: Take 8 mg by mouth daily. ) 30 tablet 1  . lidocaine-prilocaine (EMLA) cream Apply to affected area once 30 g 3  . loratadine (CLARITIN) 10 MG tablet Take 1 tablet (10 mg total) by mouth daily. 60 tablet 0  . LORazepam (ATIVAN) 0.5 MG tablet Take 1 tablet (0.5 mg total) by mouth at bedtime as needed (Nausea or vomiting). 30 tablet 0  . Multiple Vitamins-Minerals (MULTIVITAMIN WITH MINERALS) tablet Take 2 tablets by mouth daily.    Marland Kitchen  prochlorperazine (COMPAZINE) 10 MG tablet Take 1 tablet (10 mg total) by mouth every 6 (six) hours as needed (Nausea or vomiting). 30 tablet 1  . valACYclovir (VALTREX) 1000 MG tablet TAKE 1 TABLET (1,000 MG TOTAL) BY MOUTH DAILY. 30 tablet 1   No current facility-administered medications for this visit.    OBJECTIVE:   There were no vitals filed for this visit.   There is no height or weight on file to calculate BMI.   Wt Readings from Last 3 Encounters:  12/27/19 210 lb 6.4 oz (95.4 kg)  12/13/19 210 lb 4 oz (95.4 kg)  12/06/19 209 lb 12.8 oz (95.2 kg)      ECOG FS:    LAB RESULTS:  CMP     Component Value Date/Time   NA 141 12/27/2019 1142   K 3.7 12/27/2019 1142   CL 107 12/27/2019 1142   CO2 26 12/27/2019 1142   GLUCOSE 116 (H) 12/27/2019 1142   BUN 10 12/27/2019 1142   CREATININE 0.76 12/27/2019 1142   CREATININE 0.95 10/17/2019 1529   CALCIUM 9.4 12/27/2019 1142   PROT 7.1 12/27/2019 1142   ALBUMIN 3.7 12/27/2019 1142   AST 15 12/27/2019 1142   AST 16 10/17/2019 1529   ALT 11 12/27/2019 1142     ALT 13 10/17/2019 1529   ALKPHOS 102 12/27/2019 1142   BILITOT 0.4 12/27/2019 1142   BILITOT 1.1 10/17/2019 1529   GFRNONAA >60 12/27/2019 1142   GFRNONAA >60 10/17/2019 1529   GFRAA >60 12/27/2019 1142   GFRAA >60 10/17/2019 1529    No results found for: TOTALPROTELP, ALBUMINELP, A1GS, A2GS, BETS, BETA2SER, GAMS, MSPIKE, SPEI  Lab Results  Component Value Date   WBC 7.2 12/27/2019   NEUTROABS 4.8 12/27/2019   HGB 10.6 (L) 12/27/2019   HCT 30.4 (L) 12/27/2019   MCV 73.1 (L) 12/27/2019   PLT 193 12/27/2019    No results found for: LABCA2  No components found for: [id number]  No results for input(s): INR in the last 168 hours.  No results found for: LABCA2  No results found for: NIO270  No results found for: JJK093  No results found for: GHW299  Lab Results  Component Value Date   CA2729 29.8 11/16/2019    No components found for: HGQUANT  No results found for: CEA1 / No results found for: CEA1   No results found for: AFPTUMOR  No results found for: CHROMOGRNA  No results found for: KPAFRELGTCHN, LAMBDASER, KAPLAMBRATIO (kappa/lambda light chains)  No results found for: HGBA, HGBA2QUANT, HGBFQUANT, HGBSQUAN (Hemoglobinopathy evaluation)   No results found for: LDH  Lab Results  Component Value Date   IRON 99 10/17/2019   TIBC 391 10/17/2019   IRONPCTSAT 25 10/17/2019   (Iron and TIBC)  Lab Results  Component Value Date   FERRITIN 45 10/17/2019    Urinalysis No results found for: COLORURINE, APPEARANCEUR, LABSPEC, PHURINE, GLUCOSEU, HGBUR, BILIRUBINUR, KETONESUR, PROTEINUR, UROBILINOGEN, NITRITE, LEUKOCYTESUR   STUDIES: No results found.   ELIGIBLE FOR AVAILABLE RESEARCH PROTOCOL: B7169  ASSESSMENT: 44 y.o. [place] woman status post right breast upper inner quadrant biopsy 10/03/2019 for a clinical mT3 N1, stage IIIC invasive ductal carcinoma, grade 3, triple negative, with an MIB-1 of 80%  (a) biopsy of a right axillary node  2019-11-09 showed carcinoma  (b) CT chest/abd/pelvis 10/31/2019 shows 1.5 cm hypodense lesion in liver, lung nodules <0.3 cm  (c) bone scan 10/31/2019 negative  (d) liver MRI 11/11/2019 confirms a targetoid lesion in the left  hepatic lobe suspicious for metastasis   (i) liver biopsy 2019-11-23 showed no evidence of malignancy (benign).  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*14)   (a) two variants of uncertain significance were noted - one in the [facility] gene called c.1295T>G and one in the POLD1 gene called c.34G>A  (b) no additional mutations of concern were found in AIP, ALK, APC, ATM, AXIN2,BAP1,  BARD1, BLM, BMPR1A, BRCA1, BRCA2, BRIP1, CASR, CDC73, CDH1, CDK4, CDKN1B, CDKN1C, CDKN2A (p14ARF), CDKN2A (p16INK4a), CEBPA, CHEK2, CTNNA1, DICER1, DIS3L2, EGFR (c.2369C>T, p.Thr790Met variant only), EPCAM (Deletion/duplication testing only), FH, FLCN, GATA2, GPC3, GREM1 (Promoter region deletion/duplication testing only), HOXB13 (c.251G>A, p.Gly84Glu), HRAS, KIT, MAX, MEN1, MET, MITF (c.952G>A, p.Glu318Lys variant only), MLH1, MSH2, MSH3, MSH6, MUTYH, NBN, NF1, NF2, NTHL1, PALB2, PDGFRA, PHOX2B, PMS2, POLD1, POLE, POT1, PRKAR1A, PTCH1, PTEN, RAD50, RAD51C, RAD51D, RB1, RECQL4, RET, RNF43, RUNX1, SDHAF2, SDHA (sequence changes only), SDHB, SDHC, SDHD, SMAD4, SMARCA4, SMARCB1, SMARCE1, STK11, SUFU, TERC, TERT, TMEM127, TP53, TSC1, TSC2, VHL, WRN and WT1.  (2) neoadjuvant chemotherapy consisting of cyclophosphamide and doxorubicin in dose dense fashion x4 started 2019-11-16, to be followed by paclitaxel and carboplatin weekly x12  (a) echo 10/24/2019 shows an ejection fraction in the 60-65% range  (3) definitive surgery to follow  (4) adjuvant radiation  (5) hemoglobin C trait:  (a) ferritin 10/17/2019 was 45 with saturation 25%, hemoglobin  11.8 and MCV 71.5  (b) hemoglobin electrophoresis 2019-11-22 showed: A: 62.6%, A2: 3.7%, C: 33.2%, F:0 5.1%, S: 0%   PLAN: I was unable.  She is scheduled to see [place] and receive her first Taxol treatment next week.   [name]. Uma Jerde, MD 01/06/20 5:11 PM Medical Oncology and Hematology [facility] [place], [place] [address] Tel. [phone]    Fax. [phone]   I, Wilburn Mylar, am acting as scribe for Dr. [name]. Alaisha Eversley.  I, Lurline Del MD, have reviewed the above documentation for accuracy and completeness, and I agree with the above.    *Total Encounter Time as defined by the Centers for Medicare and Medicaid Services includes, in addition to the face-to-face time of a patient visit (documented in the note above) non-face-to-face time: obtaining and reviewing outside history, ordering and reviewing medications, tests or procedures, care coordination (communications with other health care professionals or caregivers) and documentation in the medical record.

## 2020-01-06 ENCOUNTER — Inpatient Hospital Stay: Payer: BC Managed Care – PPO

## 2020-01-06 ENCOUNTER — Inpatient Hospital Stay (HOSPITAL_BASED_OUTPATIENT_CLINIC_OR_DEPARTMENT_OTHER): Payer: BC Managed Care – PPO | Admitting: Oncology

## 2020-01-06 DIAGNOSIS — Z171 Estrogen receptor negative status [ER-]: Secondary | ICD-10-CM

## 2020-01-06 DIAGNOSIS — C50211 Malignant neoplasm of upper-inner quadrant of right female breast: Secondary | ICD-10-CM

## 2020-01-09 NOTE — Progress Notes (Signed)
[place]  [phone] [phone]     ID: Krista [name] DOB: April 24, 1976  MR#: [id number]  GQQ#:[id number]  Patient Care Team: Patient, No Pcp Per as PCP - General (General Practice) Magrinat, [name], MD as Consulting Physician (Oncology) Erroll Luna, MD as Consulting Physician (General Surgery) Eppie Gibson, MD as Attending Physician (Radiation Oncology) Rockwell Germany, RN as Oncology Nurse Navigator Mauro Kaufmann, RN as Oncology Nurse Navigator Chauncey Cruel, MD OTHER MD:   CHIEF COMPLAINT: Triple negative breast cancer  CURRENT TREATMENT: Neoadjuvant chemotherapy   INTERVAL HISTORY: Krista [name] returns today for follow-up and treatment of her triple negative breast cancer. She was last seen here on 12/27/2019.  We attempted a phone visit on 01/06/2020 but the patient did not answer her phone and did not return our call--she was at the beach at this time and enjoyed that week quite a bit  She completed 4 cycles of cyclophosphamide and doxorubicin on 12/27/2019.  She is scheduled to start carboplatin and paclitaxel today.  She tells me the last Krista [name] cycles were very difficult and she actually had some vomiting as well as nausea with cycle 4.  She felt chilled and achy from the Neulasta.  Those symptoms likely have resolved  Since her last visit here, she has not undergone any additional studies.     REVIEW OF SYSTEMS: Krista [name] feels very anxious about starting the new treatment because she had such a hard time with the latest but right now she feels good.  She had altered taste for a while but that has improved.  She really does not feel much in the right breast at all, which is very encouraging.  Detailed review of systems today was otherwise stable   HISTORY OF CURRENT ILLNESS: From the original intake note:  Krista [name] [name]"] herself palpated a lump in the upper-inner right breast sometime  late December 2020 or early January 2021.  As the mass grew larger and persisted through menstrual periods she brought it to medical attention and underwent bilateral diagnostic mammography with tomography and bilateral breast ultrasonography at [facility] on 09/26/2019 showing: breast density category C; 5.1 cm mass involving upper-inner quadrant of right breast; no evidence of right axillary lymphadenopathy or left breast malignancy.  Accordingly on 10/03/2019 she proceeded to biopsy of the right breast area in question. The pathology from this procedure ([id number]) showed: invasive ductal carcinoma, grade 3. Prognostic indicators significant for: estrogen receptor, 0% negative and progesterone receptor, 0% negative. Proliferation marker Ki67 at 80%. HER2 negative by immunohistochemistry (1+).  The patient's subsequent history is as detailed below.   PAST MEDICAL HISTORY: Past Medical History:  Diagnosis Date  . Anxiety   . Cancer ([place])   . Family history of breast cancer   . Family history of cervical cancer   . Family history of lung cancer   . Hemorrhoids     PAST SURGICAL HISTORY: No prior surgeries   FAMILY HISTORY: Family History  Problem Relation Age of Onset  . Breast cancer Mother 62       double mastectomy  . Breast cancer Half-Sister 47  . Cirrhosis Half-Sister   . Heart Problems Father   . Stroke Father   . Breast cancer Maternal Aunt 70  . Cancer Maternal Uncle 67       unknown type  . Ovarian cancer Maternal Grandmother        dx. in her  mid-40s  . Lung cancer Paternal Grandmother   . Cancer Other        unknown type; maternal great-uncles/aunts (x3)  . Diabetes Half-Brother   . Cancer Other        unknown type; matenral great-uncles/aunt (x4)  . Cancer Maternal Great-grandfather        unknown type  . Breast cancer Cousin 28       paternal cousin  The patient's father died at age 39 from cardiac complications of drug use.  His mother, the patient's  paternal grandmother had what seems to have been cancer of the lung metastatic to the spine.  There was no other cancer on his side of the family to the patient's knowledge.  The patient's mother died at age 53 in the setting of multiple medical issues but she had undergone double mastectomy at the age of 90.  Her mother, the patient's maternal grandmother had cervical cancer.  The patient's mother had 3 brothers and 3 sisters.  1 of those 3 sisters had breast cancer diagnosed at the age of 42.  The patient herself has Krista [name] brothers and 3 sisters one of her sisters was diagnosed with breast cancer at the age of 70 and died at the age of 45   GYNECOLOGIC HISTORY:  No LMP recorded. Menarche: 44 years old Age at first live birth: 44 years old [place] P 3 LMP regular, last approximately 5 days, of which the second day is heavy Contraceptive: Husband status post vasectomy HRT no  Hysterectomy? no BSO?  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  Her husband Krista [name] is a Research scientist (medical) and also Librarian, academic at [employer].  Their children are Krista [name], 20, who is Scientist, product/process development at [employer], Krista [name], who will be going to [employer] this coming year and then switch over to [employer], [employer] and aiming for an art rehabilitation degree; and Krista [name], Krista [name], currently attending [employer].  The patient is not a church attender    ADVANCED DIRECTIVES: In the absence of any documents to the contrary the patient's husband is her healthcare power of attorney   HEALTH MAINTENANCE: Social History   Tobacco Use  . Smoking status: Never Smoker  . Smokeless tobacco: Never Used  Substance Use Topics  . Alcohol use: Not Currently    Comment: Social drinker  . Drug use: Never     Colonoscopy: n/a (age)  PAP: UTD  Bone density: n/a (age)   Allergies  Allergen Reactions  . Emend [Fosaprepitant] Shortness Of Breath    Current  Outpatient Medications  Medication Sig Dispense Refill  . acetaminophen (TYLENOL) 500 MG tablet Take 1-Krista [name] tablets (500-1,000 mg total) by mouth every 6 (six) hours as needed. (Patient taking differently: Take 500-1,000 mg by mouth every 6 (six) hours as needed for moderate pain or fever. ) 60 tablet 0  . desloratadine (CLARINEX) 5 MG tablet Take 5 mg by mouth daily.    Marland Kitchen dexamethasone (DECADRON) 4 MG tablet Take Krista [name] tablets by mouth daily starting the day after Cytoxan x 3 days. Take with food. (Patient taking differently: Take 8 mg by mouth daily. ) 30 tablet 1  . lidocaine-prilocaine (EMLA) cream Apply to affected area once 30 g 3  . loratadine (CLARITIN) 10 MG tablet Take 1 tablet (10 mg total) by mouth daily. 60 tablet 0  . LORazepam (ATIVAN) 0.5 MG tablet Take 1 tablet (  0.5 mg total) by mouth at bedtime as needed (Nausea or vomiting). 30 tablet 0  . Multiple Vitamins-Minerals (MULTIVITAMIN WITH MINERALS) tablet Take Krista [name] tablets by mouth daily.    . prochlorperazine (COMPAZINE) 10 MG tablet Take 1 tablet (10 mg total) by mouth every 6 (six) hours as needed (Nausea or vomiting). 30 tablet 1  . valACYclovir (VALTREX) 1000 MG tablet TAKE 1 TABLET (1,000 MG TOTAL) BY MOUTH DAILY. 30 tablet 1   No current facility-administered medications for this visit.    OBJECTIVE: African-American woman who appears younger than stated age  44:   01/10/20 1310  BP: (!) 132/93  Pulse: 98  Resp: 20  Temp: 98.Krista [name] F (36.8 C)  SpO2: 100%   Wt Readings from Last 3 Encounters:  01/10/20 210 lb 4.8 oz (95.4 kg)  12/27/19 210 lb 6.4 oz (95.4 kg)  12/13/19 210 lb 4 oz (95.4 kg)   Body mass index is 32.94 kg/m.    ECOG FS:1 - Symptomatic but completely ambulatory  Ocular: Sclerae unicteric, pupils round and equal Ear-nose-throat: Wearing a mask Lymphatic: No cervical or supraclavicular adenopathy Lungs no rales or rhonchi Heart regular rate and rhythm Abd soft, nontender, positive bowel sounds MSK no  focal spinal tenderness, no joint edema Neuro: non-focal, well-oriented, appropriate affect Breasts: I do not palpate a well-defined mass in the right breast.  Left breast is benign.  Both axillae are benign.    LAB RESULTS:  CMP     Component Value Date/Time   NA 142 01/10/2020 1257   K 3.8 01/10/2020 1257   CL 107 01/10/2020 1257   CO2 26 01/10/2020 1257   GLUCOSE 117 (H) 01/10/2020 1257   BUN 9 01/10/2020 1257   CREATININE 0.77 01/10/2020 1257   CREATININE 0.95 10/17/2019 1529   CALCIUM 9.5 01/10/2020 1257   PROT 7.Krista [name] 01/10/2020 1257   ALBUMIN 3.8 01/10/2020 1257   AST 16 01/10/2020 1257   AST 16 10/17/2019 1529   ALT 15 01/10/2020 1257   ALT 13 10/17/2019 1529   ALKPHOS 98 01/10/2020 1257   BILITOT 0.6 01/10/2020 1257   BILITOT 1.1 10/17/2019 1529   GFRNONAA >60 01/10/2020 1257   GFRNONAA >60 10/17/2019 1529   GFRAA >60 01/10/2020 1257   GFRAA >60 10/17/2019 1529    No results found for: TOTALPROTELP, ALBUMINELP, A1GS, A2GS, BETS, BETA2SER, GAMS, MSPIKE, SPEI  Lab Results  Component Value Date   WBC 7.1 01/10/2020   NEUTROABS 4.7 01/10/2020   HGB 10.5 (L) 01/10/2020   HCT 29.6 (L) 01/10/2020   MCV 74.9 (L) 01/10/2020   PLT 251 01/10/2020    No results found for: LABCA2  No components found for: UEAVWU981  No results for input(s): INR in the last 168 hours.  No results found for: LABCA2  No results found for: XBJ478  No results found for: GNF621  No results found for: HYQ657  Lab Results  Component Value Date   CA2729 29.8 11/16/2019    No components found for: HGQUANT  No results found for: CEA1 / No results found for: CEA1   No results found for: AFPTUMOR  No results found for: CHROMOGRNA  No results found for: KPAFRELGTCHN, LAMBDASER, KAPLAMBRATIO (kappa/lambda light chains)  No results found for: HGBA, HGBA2QUANT, HGBFQUANT, HGBSQUAN (Hemoglobinopathy evaluation)   No results found for: LDH  Lab Results  Component Value Date    IRON 99 10/17/2019   TIBC 391 10/17/2019   IRONPCTSAT 25 10/17/2019   (Iron and TIBC)  Lab Results  Component Value Date   FERRITIN 45 10/17/2019    Urinalysis No results found for: COLORURINE, APPEARANCEUR, LABSPEC, PHURINE, GLUCOSEU, HGBUR, BILIRUBINUR, KETONESUR, PROTEINUR, UROBILINOGEN, NITRITE, LEUKOCYTESUR   STUDIES: No results found.   ELIGIBLE FOR AVAILABLE RESEARCH PROTOCOL: T7001  ASSESSMENT: 44 y.o. [place] woman status post right breast upper inner quadrant biopsy 10/03/2019 for a clinical mT3 N1, stage IIIC invasive ductal carcinoma, grade 3, triple negative, with an MIB-1 of 80%  (a) biopsy of a right axillary node 2019-11-09 showed carcinoma  (b) CT chest/abd/pelvis 10/31/2019 shows 1.5 cm hypodense lesion in liver, lung nodules <0.3 cm  (c) bone scan 10/31/2019 negative  (d) liver MRI 11/11/2019 confirms a targetoid lesion in the left hepatic lobe suspicious for metastasis   (i) liver biopsy 2019-11-23 showed no evidence of malignancy (benign).  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*14)   (a) two variants of uncertain significance were noted - one in the [facility] gene called c.1295T>G and one in the POLD1 gene called c.34G>A  (b) no additional mutations of concern were found in AIP, ALK, APC, ATM, AXIN2,BAP1,  BARD1, BLM, BMPR1A, BRCA1, BRCA2, BRIP1, CASR, CDC73, CDH1, CDK4, CDKN1B, CDKN1C, CDKN2A (p14ARF), CDKN2A (p16INK4a), CEBPA, CHEK2, CTNNA1, DICER1, DIS3L2, EGFR (c.2369C>T, p.Thr790Met variant only), EPCAM (Deletion/duplication testing only), FH, FLCN, GATA2, GPC3, GREM1 (Promoter region deletion/duplication testing only), HOXB13 (c.251G>A, p.Gly84Glu), HRAS, KIT, MAX, MEN1, MET, MITF (c.952G>A, p.Glu318Lys variant only), MLH1, MSH2, MSH3, MSH6, MUTYH, NBN, NF1, NF2, NTHL1, PALB2, PDGFRA, PHOX2B,  PMS2, POLD1, POLE, POT1, PRKAR1A, PTCH1, PTEN, RAD50, RAD51C, RAD51D, RB1, RECQL4, RET, RNF43, RUNX1, SDHAF2, SDHA (sequence changes only), SDHB, SDHC, SDHD, SMAD4, SMARCA4, SMARCB1, SMARCE1, STK11, SUFU, TERC, TERT, TMEM127, TP53, TSC1, TSC2, VHL, WRN and WT1.  (Krista [name]) neoadjuvant chemotherapy consisting of cyclophosphamide and doxorubicin in dose dense fashion x4 started 11/16/2019 and completed 12/27/2019, to be followed by paclitaxel and carboplatin weekly x12 starting 01/10/2020  (a) echo 10/24/2019 shows an ejection fraction in the 60-65% range  (3) definitive surgery to follow  (4) adjuvant radiation  (5) hemoglobin C trait:  (a) ferritin 10/17/2019 was 45 with saturation 25%, hemoglobin 11.8 and MCV 71.5  (b) hemoglobin electrophoresis 2019-11-22 showed: A: 62.6%, A2: 3.7%, C: 33.Krista [name]%, F:0 5.1%, S: 0%   PLAN: [name] is ready to start her Taxol/carboplatin weekly treatments.  We again reviewed the possible toxicities side effects and complications of these agents and particularly concerns regarding peripheral neuropathy.  She understands there is a first dose reaction to Taxol which may occur.  She may feel achy pretty much like she did with the Neulasta and she would treated the same way.  If she has an immune reaction on the other hand she would have that here and we would take care of it directly  I have asked her to keep a symptom diary.  I am going to see her in the treatment area next week when she returns for her second dose and we will review those symptoms at that time  It is encouraging that the mass in the right breast is no longer clearly palpable.  Total encounter time 25 minutes.Sarajane Jews C. Magrinat, MD 01/10/20 1:39 PM [facility] [place], [place] [address] Tel. [phone]    Fax. [phone]   I, Jacqualyn Posey am acting as a Education administrator for [employer],  MD.   Lindie Spruce MD, have reviewed the  above documentation for accuracy and completeness, and I agree with the above.    *Total Encounter Time as defined by the Centers for Medicare and Medicaid Services includes, in addition to the face-to-face time of a patient visit (documented in the note above) non-face-to-face time: obtaining and reviewing outside history, ordering and reviewing medications, tests or procedures, care coordination (communications with other health care professionals or caregivers) and documentation in the medical record.

## 2020-01-10 ENCOUNTER — Ambulatory Visit (HOSPITAL_BASED_OUTPATIENT_CLINIC_OR_DEPARTMENT_OTHER): Payer: Medicaid Other | Admitting: Medical

## 2020-01-10 ENCOUNTER — Inpatient Hospital Stay (HOSPITAL_BASED_OUTPATIENT_CLINIC_OR_DEPARTMENT_OTHER): Payer: BC Managed Care – PPO | Admitting: Oncology

## 2020-01-10 ENCOUNTER — Inpatient Hospital Stay: Payer: BC Managed Care – PPO

## 2020-01-10 ENCOUNTER — Other Ambulatory Visit: Payer: Self-pay

## 2020-01-10 VITALS — BP 158/100 | HR 95 | Temp 98.1°F | Resp 17

## 2020-01-10 VITALS — BP 132/93 | HR 98 | Temp 98.2°F | Resp 20 | Ht 67.0 in | Wt 210.3 lb

## 2020-01-10 DIAGNOSIS — D563 Thalassemia minor: Secondary | ICD-10-CM

## 2020-01-10 DIAGNOSIS — Z171 Estrogen receptor negative status [ER-]: Secondary | ICD-10-CM

## 2020-01-10 DIAGNOSIS — T8090XA Unspecified complication following infusion and therapeutic injection, initial encounter: Secondary | ICD-10-CM

## 2020-01-10 DIAGNOSIS — C50211 Malignant neoplasm of upper-inner quadrant of right female breast: Secondary | ICD-10-CM

## 2020-01-10 DIAGNOSIS — Z95828 Presence of other vascular implants and grafts: Secondary | ICD-10-CM

## 2020-01-10 DIAGNOSIS — C50411 Malignant neoplasm of upper-outer quadrant of right female breast: Secondary | ICD-10-CM

## 2020-01-10 LAB — CBC WITH DIFFERENTIAL/PLATELET
Abs Immature Granulocytes: 0.34 10*3/uL — ABNORMAL HIGH (ref 0.00–0.07)
Basophils Absolute: 0.1 10*3/uL (ref 0.0–0.1)
Basophils Relative: 1 %
Eosinophils Absolute: 0 10*3/uL (ref 0.0–0.5)
Eosinophils Relative: 1 %
HCT: 29.6 % — ABNORMAL LOW (ref 36.0–46.0)
Hemoglobin: 10.5 g/dL — ABNORMAL LOW (ref 12.0–15.0)
Immature Granulocytes: 5 %
Lymphocytes Relative: 14 %
Lymphs Abs: 1 10*3/uL (ref 0.7–4.0)
MCH: 26.6 pg (ref 26.0–34.0)
MCHC: 35.5 g/dL (ref 30.0–36.0)
MCV: 74.9 fL — ABNORMAL LOW (ref 80.0–100.0)
Monocytes Absolute: 0.9 10*3/uL (ref 0.1–1.0)
Monocytes Relative: 13 %
Neutro Abs: 4.7 10*3/uL (ref 1.7–7.7)
Neutrophils Relative %: 66 %
Platelets: 251 10*3/uL (ref 150–400)
RBC: 3.95 MIL/uL (ref 3.87–5.11)
RDW: 21.6 % — ABNORMAL HIGH (ref 11.5–15.5)
WBC: 7.1 10*3/uL (ref 4.0–10.5)
nRBC: 0.6 % — ABNORMAL HIGH (ref 0.0–0.2)

## 2020-01-10 LAB — COMPREHENSIVE METABOLIC PANEL
ALT: 15 U/L (ref 0–44)
AST: 16 U/L (ref 15–41)
Albumin: 3.8 g/dL (ref 3.5–5.0)
Alkaline Phosphatase: 98 U/L (ref 38–126)
Anion gap: 9 (ref 5–15)
BUN: 9 mg/dL (ref 6–20)
CO2: 26 mmol/L (ref 22–32)
Calcium: 9.5 mg/dL (ref 8.9–10.3)
Chloride: 107 mmol/L (ref 98–111)
Creatinine, Ser: 0.77 mg/dL (ref 0.44–1.00)
GFR calc Af Amer: >60 mL/min (ref >60)
GFR calc non Af Amer: >60 mL/min (ref >60)
Glucose, Bld: 117 mg/dL — ABNORMAL HIGH (ref 70–99)
Potassium: 3.8 mmol/L (ref 3.5–5.1)
Sodium: 142 mmol/L (ref 135–145)
Total Bilirubin: 0.6 mg/dL (ref 0.3–1.2)
Total Protein: 7.2 g/dL (ref 6.5–8.1)

## 2020-01-10 MED ORDER — DIPHENHYDRAMINE HCL 50 MG/ML IJ SOLN
INTRAMUSCULAR | Status: AC
  Filled 2020-01-10: qty 1

## 2020-01-10 MED ORDER — FAMOTIDINE IN NACL 20-0.9 MG/50ML-% IV SOLN
20.0000 mg | Freq: Once | INTRAVENOUS | Status: AC | PRN
  Administered 2020-01-10: 20 mg via INTRAVENOUS

## 2020-01-10 MED ORDER — FAMOTIDINE IN NACL 20-0.9 MG/50ML-% IV SOLN
20.0000 mg | Freq: Once | INTRAVENOUS | Status: AC
  Administered 2020-01-10: 20 mg via INTRAVENOUS

## 2020-01-10 MED ORDER — SODIUM CHLORIDE 0.9 % IV SOLN
Freq: Once | INTRAVENOUS | Status: DC | PRN
  Filled 2020-01-10: qty 250

## 2020-01-10 MED ORDER — SODIUM CHLORIDE 0.9 % IV SOLN
Freq: Once | INTRAVENOUS | Status: AC
  Filled 2020-01-10: qty 250

## 2020-01-10 MED ORDER — FAMOTIDINE IN NACL 20-0.9 MG/50ML-% IV SOLN
INTRAVENOUS | Status: AC
  Filled 2020-01-10: qty 50

## 2020-01-10 MED ORDER — DIPHENHYDRAMINE HCL 50 MG/ML IJ SOLN
25.0000 mg | Freq: Once | INTRAMUSCULAR | Status: AC
  Administered 2020-01-10: 25 mg via INTRAVENOUS

## 2020-01-10 MED ORDER — LORAZEPAM 2 MG/ML IJ SOLN
INTRAMUSCULAR | Status: AC
  Filled 2020-01-10: qty 1

## 2020-01-10 MED ORDER — HEPARIN SOD (PORK) LOCK FLUSH 100 UNIT/ML IV SOLN
500.0000 [IU] | Freq: Once | INTRAVENOUS | Status: AC | PRN
  Administered 2020-01-10: 500 [IU]
  Filled 2020-01-10: qty 5

## 2020-01-10 MED ORDER — PALONOSETRON HCL INJECTION 0.25 MG/5ML
INTRAVENOUS | Status: AC
  Filled 2020-01-10: qty 5

## 2020-01-10 MED ORDER — LORAZEPAM 2 MG/ML IJ SOLN
0.5000 mg | Freq: Once | INTRAMUSCULAR | Status: AC
  Administered 2020-01-10: 0.5 mg via INTRAVENOUS

## 2020-01-10 MED ORDER — METHYLPREDNISOLONE SODIUM SUCC 125 MG IJ SOLR
60.0000 mg | Freq: Once | INTRAMUSCULAR | Status: AC
  Administered 2020-01-10: 60 mg via INTRAVENOUS

## 2020-01-10 MED ORDER — PALONOSETRON HCL INJECTION 0.25 MG/5ML
0.2500 mg | Freq: Once | INTRAVENOUS | Status: AC
  Administered 2020-01-10: 0.25 mg via INTRAVENOUS

## 2020-01-10 MED ORDER — MORPHINE SULFATE (PF) 2 MG/ML IV SOLN
2.0000 mg | Freq: Once | INTRAVENOUS | Status: AC
  Administered 2020-01-10: 2 mg via INTRAVENOUS

## 2020-01-10 MED ORDER — SODIUM CHLORIDE 0.9 % IV SOLN
80.0000 mg/m2 | Freq: Once | INTRAVENOUS | Status: AC
  Administered 2020-01-10: 162 mg via INTRAVENOUS
  Filled 2020-01-10: qty 27

## 2020-01-10 MED ORDER — SODIUM CHLORIDE 0.9 % IV SOLN
300.0000 mg | Freq: Once | INTRAVENOUS | Status: AC
  Administered 2020-01-10: 300 mg via INTRAVENOUS
  Filled 2020-01-10: qty 30

## 2020-01-10 MED ORDER — SODIUM CHLORIDE 0.9 % IV SOLN
10.0000 mg | Freq: Once | INTRAVENOUS | Status: AC
  Administered 2020-01-10: 10 mg via INTRAVENOUS
  Filled 2020-01-10: qty 10

## 2020-01-10 MED ORDER — SODIUM CHLORIDE 0.9% FLUSH
10.0000 mL | INTRAVENOUS | Status: DC | PRN
  Administered 2020-01-10: 10 mL
  Filled 2020-01-10: qty 10

## 2020-01-10 MED ORDER — MORPHINE SULFATE (PF) 2 MG/ML IV SOLN
INTRAVENOUS | Status: AC
  Filled 2020-01-10: qty 1

## 2020-01-10 NOTE — Progress Notes (Signed)
Taxol infusion started at 1602, at 1604 patient said she felt like she couldn't breathe, her face felt hot & she was having back pain.  Taxol infusion stopped immediately.  Shelia [employer] PA notified, he came to bedside.  Saline infusion started at 1608.  Medications given as recorded on MAR.  VS recorded on flowsheet.    Taxol infusion restarted at 1627 at rate of 69 ml/hr.  Patient tolerating, is sleeping.

## 2020-01-10 NOTE — Patient Instructions (Signed)
[facility] Discharge Instructions for Patients Receiving Chemotherapy  Today you received the following chemotherapy agents Paclitaxel & Carboplatin  To help prevent nausea and vomiting after your treatment, we encourage you to take your nausea medication as directed.   If you develop nausea and vomiting that is not controlled by your nausea medication, call the clinic.   BELOW ARE SYMPTOMS THAT SHOULD BE REPORTED IMMEDIATELY:  *FEVER GREATER THAN 100.5 F  *CHILLS WITH OR WITHOUT FEVER  NAUSEA AND VOMITING THAT IS NOT CONTROLLED WITH YOUR NAUSEA MEDICATION  *UNUSUAL SHORTNESS OF BREATH  *UNUSUAL BRUISING OR BLEEDING  TENDERNESS IN MOUTH AND THROAT WITH OR WITHOUT PRESENCE OF ULCERS  *URINARY PROBLEMS  *BOWEL PROBLEMS  UNUSUAL RASH Items with * indicate a potential emergency and should be followed up as soon as possible.  Feel free to call the clinic should you have any questions or concerns. The clinic phone number is [phone].  Please show the [facility] at check-in to the Emergency Department and triage nurse.  Paclitaxel injection What is this medicine? PACLITAXEL (PAK li TAX el) is a chemotherapy drug. It targets fast dividing cells, like cancer cells, and causes these cells to die. This medicine is used to treat ovarian cancer, breast cancer, lung cancer, Kaposi's sarcoma, and other cancers. This medicine may be used for other purposes; ask your health care provider or pharmacist if you have questions. COMMON BRAND NAME(S): Onxol, Taxol What should I tell my health care provider before I take this medicine? They need to know if you have any of these conditions:  history of irregular heartbeat  liver disease  low blood counts, like low white cell, platelet, or red cell counts  lung or breathing disease, like asthma  tingling of the fingers or toes, or other nerve disorder  an unusual or allergic reaction to paclitaxel, alcohol,  polyoxyethylated castor oil, other chemotherapy, other medicines, foods, dyes, or preservatives  pregnant or trying to get pregnant  breast-feeding How should I use this medicine? This drug is given as an infusion into a vein. It is administered in a hospital or clinic by a specially trained health care professional. Talk to your pediatrician regarding the use of this medicine in children. Special care may be needed. Overdosage: If you think you have taken too much of this medicine contact a poison control center or emergency room at once. NOTE: This medicine is only for you. Do not share this medicine with others. What if I miss a dose? It is important not to miss your dose. Call your doctor or health care professional if you are unable to keep an appointment. What may interact with this medicine? Do not take this medicine with any of the following medications:  disulfiram  metronidazole This medicine may also interact with the following medications:  antiviral medicines for hepatitis, HIV or AIDS  certain antibiotics like erythromycin and clarithromycin  certain medicines for fungal infections like ketoconazole and itraconazole  certain medicines for seizures like carbamazepine, phenobarbital, phenytoin  gemfibrozil  nefazodone  rifampin  St. John's wort This list may not describe all possible interactions. Give your health care provider a list of all the medicines, herbs, non-prescription drugs, or dietary supplements you use. Also tell them if you smoke, drink alcohol, or use illegal drugs. Some items may interact with your medicine. What should I watch for while using this medicine? Your condition will be monitored carefully while you are receiving this medicine. You will need important  blood work done while you are taking this medicine. This medicine can cause serious allergic reactions. To reduce your risk you will need to take other medicine(s) before treatment with this  medicine. If you experience allergic reactions like skin rash, itching or hives, swelling of the face, lips, or tongue, tell your doctor or health care professional right away. In some cases, you may be given additional medicines to help with side effects. Follow all directions for their use. This drug may make you feel generally unwell. This is not uncommon, as chemotherapy can affect healthy cells as well as cancer cells. Report any side effects. Continue your course of treatment even though you feel ill unless your doctor tells you to stop. Call your doctor or health care professional for advice if you get a fever, chills or sore throat, or other symptoms of a cold or flu. Do not treat yourself. This drug decreases your body's ability to fight infections. Try to avoid being around people who are sick. This medicine may increase your risk to bruise or bleed. Call your doctor or health care professional if you notice any unusual bleeding. Be careful brushing and flossing your teeth or using a toothpick because you may get an infection or bleed more easily. If you have any dental work done, tell your dentist you are receiving this medicine. Avoid taking products that contain aspirin, acetaminophen, ibuprofen, naproxen, or ketoprofen unless instructed by your doctor. These medicines may hide a fever. Do not become pregnant while taking this medicine. Women should inform their doctor if they wish to become pregnant or think they might be pregnant. There is a potential for serious side effects to an unborn child. Talk to your health care professional or pharmacist for more information. Do not breast-feed an infant while taking this medicine. Men are advised not to father a child while receiving this medicine. This product may contain alcohol. Ask your pharmacist or healthcare provider if this medicine contains alcohol. Be sure to tell all healthcare providers you are taking this medicine. Certain medicines,  like metronidazole and disulfiram, can cause an unpleasant reaction when taken with alcohol. The reaction includes flushing, headache, nausea, vomiting, sweating, and increased thirst. The reaction can last from 30 minutes to several hours. What side effects may I notice from receiving this medicine? Side effects that you should report to your doctor or health care professional as soon as possible:  allergic reactions like skin rash, itching or hives, swelling of the face, lips, or tongue  breathing problems  changes in vision  fast, irregular heartbeat  high or low blood pressure  mouth sores  pain, tingling, numbness in the hands or feet  signs of decreased platelets or bleeding - bruising, pinpoint red spots on the skin, black, tarry stools, blood in the urine  signs of decreased red blood cells - unusually weak or tired, feeling faint or lightheaded, falls  signs of infection - fever or chills, cough, sore throat, pain or difficulty passing urine  signs and symptoms of liver injury like dark yellow or brown urine; general ill feeling or flu-like symptoms; light-colored stools; loss of appetite; nausea; right upper belly pain; unusually weak or tired; yellowing of the eyes or skin  swelling of the ankles, feet, hands  unusually slow heartbeat Side effects that usually do not require medical attention (report to your doctor or health care professional if they continue or are bothersome):  diarrhea  hair loss  loss of appetite  muscle or joint   pain  nausea, vomiting  pain, redness, or irritation at site where injected  tiredness This list may not describe all possible side effects. Call your doctor for medical advice about side effects. You may report side effects to FDA at 1-800-FDA-1088. Where should I keep my medicine? This drug is given in a hospital or clinic and will not be stored at home. NOTE: This sheet is a summary. It may not cover all possible information.  If you have questions about this medicine, talk to your doctor, pharmacist, or health care provider.  2020 Elsevier/Gold Standard (2017-02-17 13:14:55)  Carboplatin injection What is this medicine? CARBOPLATIN (KAR boe pla tin) is a chemotherapy drug. It targets fast dividing cells, like cancer cells, and causes these cells to die. This medicine is used to treat ovarian cancer and many other cancers. This medicine may be used for other purposes; ask your health care provider or pharmacist if you have questions. COMMON BRAND NAME(S): Paraplatin What should I tell my health care provider before I take this medicine? They need to know if you have any of these conditions:  blood disorders  hearing problems  kidney disease  recent or ongoing radiation therapy  an unusual or allergic reaction to carboplatin, cisplatin, other chemotherapy, other medicines, foods, dyes, or preservatives  pregnant or trying to get pregnant  breast-feeding How should I use this medicine? This drug is usually given as an infusion into a vein. It is administered in a hospital or clinic by a specially trained health care professional. Talk to your pediatrician regarding the use of this medicine in children. Special care may be needed. Overdosage: If you think you have taken too much of this medicine contact a poison control center or emergency room at once. NOTE: This medicine is only for you. Do not share this medicine with others. What if I miss a dose? It is important not to miss a dose. Call your doctor or health care professional if you are unable to keep an appointment. What may interact with this medicine?  medicines for seizures  medicines to increase blood counts like filgrastim, pegfilgrastim, sargramostim  some antibiotics like amikacin, gentamicin, neomycin, streptomycin, tobramycin  vaccines Talk to your doctor or health care professional before taking any of these  medicines:  acetaminophen  aspirin  ibuprofen  ketoprofen  naproxen This list may not describe all possible interactions. Give your health care provider a list of all the medicines, herbs, non-prescription drugs, or dietary supplements you use. Also tell them if you smoke, drink alcohol, or use illegal drugs. Some items may interact with your medicine. What should I watch for while using this medicine? Your condition will be monitored carefully while you are receiving this medicine. You will need important blood work done while you are taking this medicine. This drug may make you feel generally unwell. This is not uncommon, as chemotherapy can affect healthy cells as well as cancer cells. Report any side effects. Continue your course of treatment even though you feel ill unless your doctor tells you to stop. In some cases, you may be given additional medicines to help with side effects. Follow all directions for their use. Call your doctor or health care professional for advice if you get a fever, chills or sore throat, or other symptoms of a cold or flu. Do not treat yourself. This drug decreases your body's ability to fight infections. Try to avoid being around people who are sick. This medicine may increase your risk to bruise  or bleed. Call your doctor or health care professional if you notice any unusual bleeding. Be careful brushing and flossing your teeth or using a toothpick because you may get an infection or bleed more easily. If you have any dental work done, tell your dentist you are receiving this medicine. Avoid taking products that contain aspirin, acetaminophen, ibuprofen, naproxen, or ketoprofen unless instructed by your doctor. These medicines may hide a fever. Do not become pregnant while taking this medicine. Women should inform their doctor if they wish to become pregnant or think they might be pregnant. There is a potential for serious side effects to an unborn child. Talk  to your health care professional or pharmacist for more information. Do not breast-feed an infant while taking this medicine. What side effects may I notice from receiving this medicine? Side effects that you should report to your doctor or health care professional as soon as possible:  allergic reactions like skin rash, itching or hives, swelling of the face, lips, or tongue  signs of infection - fever or chills, cough, sore throat, pain or difficulty passing urine  signs of decreased platelets or bleeding - bruising, pinpoint red spots on the skin, black, tarry stools, nosebleeds  signs of decreased red blood cells - unusually weak or tired, fainting spells, lightheadedness  breathing problems  changes in hearing  changes in vision  chest pain  high blood pressure  low blood counts - This drug may decrease the number of white blood cells, red blood cells and platelets. You may be at increased risk for infections and bleeding.  nausea and vomiting  pain, swelling, redness or irritation at the injection site  pain, tingling, numbness in the hands or feet  problems with balance, talking, walking  trouble passing urine or change in the amount of urine Side effects that usually do not require medical attention (report to your doctor or health care professional if they continue or are bothersome):  hair loss  loss of appetite  metallic taste in the mouth or changes in taste This list may not describe all possible side effects. Call your doctor for medical advice about side effects. You may report side effects to FDA at 1-800-FDA-1088. Where should I keep my medicine? This drug is given in a hospital or clinic and will not be stored at home. NOTE: This sheet is a summary. It may not cover all possible information. If you have questions about this medicine, talk to your doctor, pharmacist, or health care provider.  2020 Elsevier/Gold Standard (2007-09-21 14:38:05)

## 2020-01-11 ENCOUNTER — Telehealth: Payer: Self-pay

## 2020-01-11 ENCOUNTER — Telehealth: Payer: Self-pay | Admitting: Oncology

## 2020-01-11 NOTE — Telephone Encounter (Signed)
First time chemo follow up call. Called patient and she denied any complaints or concerns. Instructed patient to call [phone] if she has any questions or concerns.

## 2020-01-11 NOTE — Telephone Encounter (Signed)
Scheduled per 7/13 los. Provider appt added to infusion time

## 2020-01-11 NOTE — Progress Notes (Signed)
    DATE:  01/10/2020                                          X  CHEMO/IMMUNOTHERAPY REACTION           MD:  Dr. Sarajane Jews Magrinat     AGENT/BLOOD PRODUCT RECEIVING TODAY:              carboplatin and paclitaxel   AGENT/BLOOD PRODUCT RECEIVING IMMEDIATELY PRIOR TO REACTION:         paclitaxel    VS: BP:     147/87   P:       116       SPO2:       100% on room air                BP:     168/102   P:       100       SPO2:       100% on room air     REACTION(S):           facial erythema, shortness of breath, flushing and back pain   PREMEDS:     Pepcid, Benadryl, Aloxi, Ativan and dexamethasone   INTERVENTION: Paclitaxel was paused and the patient was given Pepcid 20 mg IV, morphine sulfate 2 mg IV and Solumedrol 60 mg IV.   Review of Systems  Review of Systems  Constitutional: Negative for chills, diaphoresis and fever.  HENT: Negative for trouble swallowing and voice change.   Respiratory: Positive for shortness of breath. Negative for cough, chest tightness and wheezing.   Cardiovascular: Negative for chest pain and palpitations.  Gastrointestinal: Negative for abdominal pain, constipation, diarrhea, nausea and vomiting.  Musculoskeletal: Positive for back pain. Negative for myalgias.  Skin:       Facial flushing and erythema   Neurological: Negative for dizziness, light-headedness and headaches.     Physical Exam  Physical Exam Constitutional:      General: She is not in acute distress.    Appearance: She is not diaphoretic.  HENT:     Head: Normocephalic and atraumatic.  Eyes:     General: No scleral icterus.       Right eye: No discharge.        Left eye: No discharge.     Conjunctiva/sclera: Conjunctivae normal.  Cardiovascular:     Rate and Rhythm: Regular rhythm. Tachycardia present.     Heart sounds: Normal heart sounds. No murmur heard.  No friction rub. No gallop.   Pulmonary:     Effort: Pulmonary effort is normal. No respiratory distress.     Breath  sounds: Normal breath sounds. No wheezing or rales.  Skin:    General: Skin is warm and dry.     Findings: Erythema (facial) present. No rash.  Neurological:     Mental Status: She is alert.     OUTCOME:                The patient's symptoms abated after the above intervention. She was able to restart paclitaxel at her starting rate and complete her infusion without futher issues of concern.   Sandi Mealy, MHS, PA-C

## 2020-01-11 NOTE — Progress Notes (Signed)
Pt completed rest of taxol infusion without incident.  Carbo administered, no issues noted.

## 2020-01-11 NOTE — Telephone Encounter (Signed)
-----   Message from Rolene Course, RN sent at 01/10/2020  6:17 PM EDT ----- Regarding: Magrinat 1st Tx F/U call - Taxol/Carbo Magrinat 1st Tx F/U call - Taxol/Carbo

## 2020-01-16 NOTE — Progress Notes (Signed)
[place]  [phone] [phone]     ID: Krista [name] DOB: 10-24-1975  MR#: [id number]  OIZ#:[id number]  Patient Care Team: Patient, No Pcp Per as PCP - General (General Practice) Eleanora Guinyard, [name], MD as Consulting Physician (Oncology) Erroll Luna, MD as Consulting Physician (General Surgery) Eppie Gibson, MD as Attending Physician (Radiation Oncology) Rockwell Germany, RN as Oncology Nurse Navigator Mauro Kaufmann, RN as Oncology Nurse Navigator Chauncey Cruel, MD OTHER MD:   CHIEF COMPLAINT: Triple negative breast cancer  CURRENT TREATMENT: Neoadjuvant chemotherapy   INTERVAL HISTORY: Ermalinda [name] returns today for follow-up and treatment of her triple negative breast cancer.   She started carboplatin and paclitaxel on 01/10/2020.  Today is week 2 out of a planned 12 doses.   REVIEW OF SYSTEMS: Ermalinda [name] had a significant first dose reaction, with shortness of breath, lower abdominal cramps, face on fire, and low back pain.  She immediately reported and it was immediately treated so she was able to get through the rest of the Taxol that day.  Subsequently she felt sleepy for a couple of days she had a little bit of back pain on days 3 or 4, and she felt achy on day 2.  She was a little bit constipated and remains a little bit constipated.  She is trying to drink more water, eat more fruit, and she has some stool softeners on hand.  There is no peripheral neuropathy.  She is still working.  A detailed review of systems today was otherwise stable.   HISTORY OF CURRENT ILLNESS: From the original intake note:  Krista [name] [name] herself palpated a lump in the upper-inner right breast sometime late December 2020 or early January 2021.  As the mass grew larger and persisted through menstrual periods she brought it to medical attention and underwent bilateral diagnostic mammography with tomography and  bilateral breast ultrasonography at [facility] on 09/26/2019 showing: breast density category C; 5.1 cm mass involving upper-inner quadrant of right breast; no evidence of right axillary lymphadenopathy or left breast malignancy.  Accordingly on 10/03/2019 she proceeded to biopsy of the right breast area in question. The pathology from this procedure ([id number]) showed: invasive ductal carcinoma, grade 3. Prognostic indicators significant for: estrogen receptor, 0% negative and progesterone receptor, 0% negative. Proliferation marker Ki67 at 80%. HER2 negative by immunohistochemistry (1+).  The patient's subsequent history is as detailed below.   PAST MEDICAL HISTORY: Past Medical History:  Diagnosis Date  . Anxiety   . Cancer ([place])   . Family history of breast cancer   . Family history of cervical cancer   . Family history of lung cancer   . Hemorrhoids     PAST SURGICAL HISTORY: No prior surgeries   FAMILY HISTORY: Family History  Problem Relation Age of Onset  . Breast cancer Mother 30       double mastectomy  . Breast cancer Half-Sister 67  . Cirrhosis Half-Sister   . Heart Problems Father   . Stroke Father   . Breast cancer Maternal Aunt 42  . Cancer Maternal Uncle 73       unknown type  . Ovarian cancer Maternal Grandmother        dx. in her mid-40s  . Lung cancer Paternal Grandmother   . Cancer Other        unknown type; maternal great-uncles/aunts (x3)  . Diabetes Half-Brother   . Cancer Other  unknown type; matenral great-uncles/aunt (x4)  . Cancer Maternal Great-grandfather        unknown type  . Breast cancer Cousin 63       paternal cousin  The patient's father died at age 38 from cardiac complications of drug use.  His mother, the patient's paternal grandmother had what seems to have been cancer of the lung metastatic to the spine.  There was no other cancer on his side of the family to the patient's knowledge.  The patient's mother died at age 62  in the setting of multiple medical issues but she had undergone double mastectomy at the age of 73.  Her mother, the patient's maternal grandmother had cervical cancer.  The patient's mother had 3 brothers and 3 sisters.  1 of those 3 sisters had breast cancer diagnosed at the age of 41.  The patient herself has 2 brothers and 3 sisters one of her sisters was diagnosed with breast cancer at the age of 43 and died at the age of 73   GYNECOLOGIC HISTORY:  No LMP recorded. Menarche: 44 years old Age at first live birth: 43 years old [place] P 3 LMP regular, last approximately 5 days, of which the second day is heavy Contraceptive: Husband status post vasectomy HRT no  Hysterectomy? no BSO?  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  Her husband [name] is a Research scientist (medical) and also Librarian, academic at [employer].  Their children are Rowe Pavy, 20, who is Scientist, product/process development at [employer], 18, who will be going to [employer] this coming year and then switch over to [employer], [employer] and aiming for an art rehabilitation degree; and Alanna, 45, currently attending [employer].  The patient is not a church attender    ADVANCED DIRECTIVES: In the absence of any documents to the contrary the patient's husband is her healthcare power of attorney   HEALTH MAINTENANCE: Social History   Tobacco Use  . Smoking status: Never Smoker  . Smokeless tobacco: Never Used  Substance Use Topics  . Alcohol use: Not Currently    Comment: Social drinker  . Drug use: Never     Colonoscopy: n/a (age)  PAP: UTD  Bone density: n/a (age)   Allergies  Allergen Reactions  . Emend [Fosaprepitant] Shortness Of Breath    Current Outpatient Medications  Medication Sig Dispense Refill  . acetaminophen (TYLENOL) 500 MG tablet Take 1-2 tablets (500-1,000 mg total) by mouth every 6 (six) hours as needed. (Patient taking differently: Take 500-1,000  mg by mouth every 6 (six) hours as needed for moderate pain or fever. ) 60 tablet 0  . desloratadine (CLARINEX) 5 MG tablet Take 5 mg by mouth daily.    Marland Kitchen dexamethasone (DECADRON) 4 MG tablet Take 2 tablets by mouth daily starting the day after Cytoxan x 3 days. Take with food. (Patient taking differently: Take 8 mg by mouth daily. ) 30 tablet 1  . lidocaine-prilocaine (EMLA) cream Apply to affected area once 30 g 3  . loratadine (CLARITIN) 10 MG tablet Take 1 tablet (10 mg total) by mouth daily. 60 tablet 0  . LORazepam (ATIVAN) 0.5 MG tablet Take 1 tablet (0.5 mg total) by mouth at bedtime as needed (Nausea or vomiting). 30 tablet 0  . Multiple Vitamins-Minerals (MULTIVITAMIN WITH MINERALS) tablet Take 2 tablets by mouth daily.    . prochlorperazine (COMPAZINE) 10 MG tablet Take 1  tablet (10 mg total) by mouth every 6 (six) hours as needed (Nausea or vomiting). 30 tablet 1  . valACYclovir (VALTREX) 1000 MG tablet TAKE 1 TABLET (1,000 MG TOTAL) BY MOUTH DAILY. 30 tablet 1   No current facility-administered medications for this visit.    OBJECTIVE: African-American woman in no acute distress  Vitals:   01/17/20 1218  BP: 139/88  Pulse: (!) 104  Resp: 20  Temp: 98.7 F (37.1 C)  SpO2: 97%   Wt Readings from Last 3 Encounters:  01/17/20 212 lb 12.8 oz (96.5 kg)  01/10/20 210 lb 4.8 oz (95.4 kg)  12/27/19 210 lb 6.4 oz (95.4 kg)   Body mass index is 33.33 kg/m.    ECOG FS:1 - Symptomatic but completely ambulatory  Sclerae unicteric, EOMs intact Wearing a mask No cervical or supraclavicular adenopathy Lungs no rales or rhonchi Heart regular rate and rhythm Abd soft, nontender, positive bowel sounds MSK no focal spinal tenderness, no upper extremity lymphedema Neuro: nonfocal, well oriented, appropriate affect Breasts: Deferred   LAB RESULTS:  CMP     Component Value Date/Time   NA 143 01/17/2020 1156   K 3.8 01/17/2020 1156   CL 110 01/17/2020 1156   CO2 25 01/17/2020  1156   GLUCOSE 142 (H) 01/17/2020 1156   BUN 9 01/17/2020 1156   CREATININE 0.76 01/17/2020 1156   CREATININE 0.95 10/17/2019 1529   CALCIUM 9.5 01/17/2020 1156   PROT 6.8 01/17/2020 1156   ALBUMIN 3.7 01/17/2020 1156   AST 15 01/17/2020 1156   AST 16 10/17/2019 1529   ALT 15 01/17/2020 1156   ALT 13 10/17/2019 1529   ALKPHOS 73 01/17/2020 1156   BILITOT 0.6 01/17/2020 1156   BILITOT 1.1 10/17/2019 1529   GFRNONAA >60 01/17/2020 1156   GFRNONAA >60 10/17/2019 1529   GFRAA >60 01/17/2020 1156   GFRAA >60 10/17/2019 1529    No results found for: TOTALPROTELP, ALBUMINELP, A1GS, A2GS, BETS, BETA2SER, GAMS, MSPIKE, SPEI  Lab Results  Component Value Date   WBC 5.1 01/17/2020   NEUTROABS 4.1 01/17/2020   HGB 9.6 (L) 01/17/2020   HCT 27.4 (L) 01/17/2020   MCV 75.1 (L) 01/17/2020   PLT 261 01/17/2020    No results found for: LABCA2  No components found for: SFKCLE751  No results for input(s): INR in the last 168 hours.  No results found for: LABCA2  No results found for: ZGY174  No results found for: BSW967  No results found for: RFF638  Lab Results  Component Value Date   CA2729 29.8 11/16/2019    No components found for: HGQUANT  No results found for: CEA1 / No results found for: CEA1   No results found for: AFPTUMOR  No results found for: CHROMOGRNA  No results found for: KPAFRELGTCHN, LAMBDASER, KAPLAMBRATIO (kappa/lambda light chains)  No results found for: HGBA, HGBA2QUANT, HGBFQUANT, HGBSQUAN (Hemoglobinopathy evaluation)   No results found for: LDH  Lab Results  Component Value Date   IRON 99 10/17/2019   TIBC 391 10/17/2019   IRONPCTSAT 25 10/17/2019   (Iron and TIBC)  Lab Results  Component Value Date   FERRITIN 45 10/17/2019    Urinalysis No results found for: COLORURINE, APPEARANCEUR, LABSPEC, PHURINE, GLUCOSEU, HGBUR, BILIRUBINUR, KETONESUR, PROTEINUR, UROBILINOGEN, NITRITE, LEUKOCYTESUR   STUDIES: No results  found.   ELIGIBLE FOR AVAILABLE RESEARCH PROTOCOL: G6659  ASSESSMENT: 44 y.o. [place] woman status post right breast upper inner quadrant biopsy 10/03/2019 for a clinical mT3 N1, stage IIIC invasive  ductal carcinoma, grade 3, triple negative, with an MIB-1 of 80%  (a) biopsy of a right axillary node 2019-11-09 showed carcinoma  (b) CT chest/abd/pelvis 10/31/2019 shows 1.5 cm hypodense lesion in liver, lung nodules <0.3 cm  (c) bone scan 10/31/2019 negative  (d) liver MRI 11/11/2019 confirms a targetoid lesion in the left hepatic lobe suspicious for metastasis   (i) liver biopsy 2019-11-23 showed no evidence of malignancy (benign).  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 (p.Thr276Alafs*14)   (a) two variants of uncertain significance were noted - one in the [facility] gene called c.1295T>G and one in the POLD1 gene called c.34G>A  (b) no additional mutations of concern were found in AIP, ALK, APC, ATM, AXIN2,BAP1,  BARD1, BLM, BMPR1A, BRCA1, BRCA2, BRIP1, CASR, CDC73, CDH1, CDK4, CDKN1B, CDKN1C, CDKN2A (p14ARF), CDKN2A (p16INK4a), CEBPA, CHEK2, CTNNA1, DICER1, DIS3L2, EGFR (c.2369C>T, p.Thr790Met variant only), EPCAM (Deletion/duplication testing only), FH, FLCN, GATA2, GPC3, GREM1 (Promoter region deletion/duplication testing only), HOXB13 (c.251G>A, p.Gly84Glu), HRAS, KIT, MAX, MEN1, MET, MITF (c.952G>A, p.Glu318Lys variant only), MLH1, MSH2, MSH3, MSH6, MUTYH, NBN, NF1, NF2, NTHL1, PALB2, PDGFRA, PHOX2B, PMS2, POLD1, POLE, POT1, PRKAR1A, PTCH1, PTEN, RAD50, RAD51C, RAD51D, RB1, RECQL4, RET, RNF43, RUNX1, SDHAF2, SDHA (sequence changes only), SDHB, SDHC, SDHD, SMAD4, SMARCA4, SMARCB1, SMARCE1, STK11, SUFU, TERC, TERT, TMEM127, TP53, TSC1, TSC2, VHL, WRN and WT1.  (2) neoadjuvant chemotherapy consisting of cyclophosphamide and doxorubicin in dose dense  fashion x4 started 11/16/2019 and completed 12/27/2019, to be followed by paclitaxel and carboplatin weekly x12 starting 01/10/2020  (a) echo 10/24/2019 shows an ejection fraction in the 60-65% range  (3) definitive surgery to follow  (4) adjuvant radiation  (5) hemoglobin C trait:  (a) ferritin 10/17/2019 was 45 with saturation 25%, hemoglobin 11.8 and MCV 71.5  (b) hemoglobin electrophoresis 2019-11-22 showed: A: 62.6%, A2: 3.7%, C: 33.2%, F:0 5.1%, S: 0%   PLAN: [name] got through her first Taxol/carbo with some difficulty but she did get through it.  This treatment today should be considerably better or perhaps show none of those symptoms and certainly by the third treatment of 2 weeks from now those should not occur.  She is going to be constipated.  I suggested she use MiraLAX daily and take stool softeners to twice daily as needed the goal being a soft bowel movement at least every third day.  If she does not have a bowel movement on day 3 she should use a laxative of choice.  At this point we are going to start seeing her with every other chemotherapy treatment.  When she gets to the eighth dose of Taxol we will start seeing her on a weekly basis  She knows to call for any other issue that may develop before the next visit  Total encounter time 25 minutes.Sarajane Jews C. Alyjah Lovingood, MD 01/17/20 12:37 PM Medical Oncology and Hematology [facility] [place], [place] [address] Tel. [phone]    Fax. [phone]   I, Wilburn Mylar, am acting as scribe for Dr. [name]. Jovonni Borquez.  I, Lurline Del MD, have reviewed the above documentation for accuracy and completeness, and I agree with the above.    *Total Encounter Time as defined by the Centers for Medicare and Medicaid Services includes, in addition to the face-to-face time of a patient visit (documented in the note above) non-face-to-face time: obtaining and reviewing  outside history,  ordering and reviewing medications, tests or procedures, care coordination (communications with other health care professionals or caregivers) and documentation in the medical record.

## 2020-01-17 ENCOUNTER — Inpatient Hospital Stay: Payer: BC Managed Care – PPO

## 2020-01-17 ENCOUNTER — Other Ambulatory Visit: Payer: Self-pay

## 2020-01-17 ENCOUNTER — Encounter: Payer: Self-pay | Admitting: *Deleted

## 2020-01-17 ENCOUNTER — Other Ambulatory Visit: Payer: Self-pay | Admitting: Oncology

## 2020-01-17 ENCOUNTER — Inpatient Hospital Stay (HOSPITAL_BASED_OUTPATIENT_CLINIC_OR_DEPARTMENT_OTHER): Payer: BC Managed Care – PPO | Admitting: Oncology

## 2020-01-17 ENCOUNTER — Inpatient Hospital Stay (HOSPITAL_BASED_OUTPATIENT_CLINIC_OR_DEPARTMENT_OTHER): Payer: BC Managed Care – PPO | Admitting: Medical

## 2020-01-17 VITALS — BP 139/88 | HR 104 | Temp 98.7°F | Resp 20 | Ht 67.0 in | Wt 212.8 lb

## 2020-01-17 DIAGNOSIS — D563 Thalassemia minor: Secondary | ICD-10-CM | POA: Diagnosis not present

## 2020-01-17 DIAGNOSIS — C50911 Malignant neoplasm of unspecified site of right female breast: Secondary | ICD-10-CM | POA: Diagnosis not present

## 2020-01-17 DIAGNOSIS — C50211 Malignant neoplasm of upper-inner quadrant of right female breast: Secondary | ICD-10-CM

## 2020-01-17 DIAGNOSIS — T8090XA Unspecified complication following infusion and therapeutic injection, initial encounter: Secondary | ICD-10-CM

## 2020-01-17 DIAGNOSIS — Z95828 Presence of other vascular implants and grafts: Secondary | ICD-10-CM

## 2020-01-17 DIAGNOSIS — Z171 Estrogen receptor negative status [ER-]: Secondary | ICD-10-CM

## 2020-01-17 DIAGNOSIS — C50411 Malignant neoplasm of upper-outer quadrant of right female breast: Secondary | ICD-10-CM

## 2020-01-17 DIAGNOSIS — Z1509 Genetic susceptibility to other malignant neoplasm: Secondary | ICD-10-CM | POA: Diagnosis not present

## 2020-01-17 DIAGNOSIS — Z1501 Genetic susceptibility to malignant neoplasm of breast: Secondary | ICD-10-CM

## 2020-01-17 LAB — CBC WITH DIFFERENTIAL/PLATELET
Abs Immature Granulocytes: 0.05 10*3/uL (ref 0.00–0.07)
Basophils Absolute: 0.1 10*3/uL (ref 0.0–0.1)
Basophils Relative: 1 %
Eosinophils Absolute: 0 10*3/uL (ref 0.0–0.5)
Eosinophils Relative: 0 %
HCT: 27.4 % — ABNORMAL LOW (ref 36.0–46.0)
Hemoglobin: 9.6 g/dL — ABNORMAL LOW (ref 12.0–15.0)
Immature Granulocytes: 1 %
Lymphocytes Relative: 15 %
Lymphs Abs: 0.8 10*3/uL (ref 0.7–4.0)
MCH: 26.3 pg (ref 26.0–34.0)
MCHC: 35 g/dL (ref 30.0–36.0)
MCV: 75.1 fL — ABNORMAL LOW (ref 80.0–100.0)
Monocytes Absolute: 0.1 10*3/uL (ref 0.1–1.0)
Monocytes Relative: 3 %
Neutro Abs: 4.1 10*3/uL (ref 1.7–7.7)
Neutrophils Relative %: 80 %
Platelets: 261 10*3/uL (ref 150–400)
RBC: 3.65 MIL/uL — ABNORMAL LOW (ref 3.87–5.11)
RDW: 21 % — ABNORMAL HIGH (ref 11.5–15.5)
WBC: 5.1 10*3/uL (ref 4.0–10.5)
nRBC: 0 % (ref 0.0–0.2)

## 2020-01-17 LAB — COMPREHENSIVE METABOLIC PANEL
ALT: 15 U/L (ref 0–44)
AST: 15 U/L (ref 15–41)
Albumin: 3.7 g/dL (ref 3.5–5.0)
Alkaline Phosphatase: 73 U/L (ref 38–126)
Anion gap: 8 (ref 5–15)
BUN: 9 mg/dL (ref 6–20)
CO2: 25 mmol/L (ref 22–32)
Calcium: 9.5 mg/dL (ref 8.9–10.3)
Chloride: 110 mmol/L (ref 98–111)
Creatinine, Ser: 0.76 mg/dL (ref 0.44–1.00)
GFR calc Af Amer: >60 mL/min (ref >60)
GFR calc non Af Amer: >60 mL/min (ref >60)
Glucose, Bld: 142 mg/dL — ABNORMAL HIGH (ref 70–99)
Potassium: 3.8 mmol/L (ref 3.5–5.1)
Sodium: 143 mmol/L (ref 135–145)
Total Bilirubin: 0.6 mg/dL (ref 0.3–1.2)
Total Protein: 6.8 g/dL (ref 6.5–8.1)

## 2020-01-17 MED ORDER — FAMOTIDINE IN NACL 20-0.9 MG/50ML-% IV SOLN
20.0000 mg | Freq: Once | INTRAVENOUS | Status: AC | PRN
  Administered 2020-01-17: 20 mg via INTRAVENOUS

## 2020-01-17 MED ORDER — PALONOSETRON HCL INJECTION 0.25 MG/5ML
INTRAVENOUS | Status: AC
  Filled 2020-01-17: qty 5

## 2020-01-17 MED ORDER — PALONOSETRON HCL INJECTION 0.25 MG/5ML
0.2500 mg | Freq: Once | INTRAVENOUS | Status: AC
  Administered 2020-01-17: 0.25 mg via INTRAVENOUS

## 2020-01-17 MED ORDER — SODIUM CHLORIDE 0.9 % IV SOLN
80.0000 mg/m2 | Freq: Once | INTRAVENOUS | Status: AC
  Administered 2020-01-17: 162 mg via INTRAVENOUS
  Filled 2020-01-17: qty 27

## 2020-01-17 MED ORDER — FAMOTIDINE IN NACL 20-0.9 MG/50ML-% IV SOLN
INTRAVENOUS | Status: AC
  Filled 2020-01-17: qty 50

## 2020-01-17 MED ORDER — SODIUM CHLORIDE 0.9% FLUSH
10.0000 mL | INTRAVENOUS | Status: DC | PRN
  Administered 2020-01-17: 10 mL
  Filled 2020-01-17: qty 10

## 2020-01-17 MED ORDER — SODIUM CHLORIDE 0.9 % IV SOLN
Freq: Once | INTRAVENOUS | Status: DC | PRN
  Filled 2020-01-17: qty 250

## 2020-01-17 MED ORDER — SODIUM CHLORIDE 0.9 % IV SOLN
300.0000 mg | Freq: Once | INTRAVENOUS | Status: AC
  Administered 2020-01-17: 300 mg via INTRAVENOUS
  Filled 2020-01-17: qty 30

## 2020-01-17 MED ORDER — DIPHENHYDRAMINE HCL 50 MG/ML IJ SOLN
25.0000 mg | Freq: Once | INTRAMUSCULAR | Status: AC
  Administered 2020-01-17: 25 mg via INTRAVENOUS

## 2020-01-17 MED ORDER — HEPARIN SOD (PORK) LOCK FLUSH 100 UNIT/ML IV SOLN
500.0000 [IU] | Freq: Once | INTRAVENOUS | Status: AC | PRN
  Administered 2020-01-17: 500 [IU]
  Filled 2020-01-17: qty 5

## 2020-01-17 MED ORDER — LORAZEPAM 2 MG/ML IJ SOLN
0.5000 mg | Freq: Once | INTRAMUSCULAR | Status: AC
  Administered 2020-01-17: 0.5 mg via INTRAVENOUS

## 2020-01-17 MED ORDER — LORAZEPAM 2 MG/ML IJ SOLN
INTRAMUSCULAR | Status: AC
  Filled 2020-01-17: qty 1

## 2020-01-17 MED ORDER — METHYLPREDNISOLONE SODIUM SUCC 125 MG IJ SOLR
125.0000 mg | Freq: Once | INTRAMUSCULAR | Status: AC | PRN
  Administered 2020-01-17: 60 mg via INTRAVENOUS

## 2020-01-17 MED ORDER — DIPHENHYDRAMINE HCL 50 MG/ML IJ SOLN
25.0000 mg | Freq: Once | INTRAMUSCULAR | Status: AC | PRN
  Administered 2020-01-17: 25 mg via INTRAVENOUS

## 2020-01-17 MED ORDER — SODIUM CHLORIDE 0.9 % IV SOLN
10.0000 mg | Freq: Once | INTRAVENOUS | Status: AC
  Administered 2020-01-17: 10 mg via INTRAVENOUS
  Filled 2020-01-17: qty 10

## 2020-01-17 MED ORDER — DIPHENHYDRAMINE HCL 50 MG/ML IJ SOLN
INTRAMUSCULAR | Status: AC
  Filled 2020-01-17: qty 1

## 2020-01-17 MED ORDER — FAMOTIDINE IN NACL 20-0.9 MG/50ML-% IV SOLN
20.0000 mg | Freq: Once | INTRAVENOUS | Status: AC
  Administered 2020-01-17: 20 mg via INTRAVENOUS

## 2020-01-17 MED ORDER — SODIUM CHLORIDE 0.9 % IV SOLN
Freq: Once | INTRAVENOUS | Status: AC
  Filled 2020-01-17: qty 250

## 2020-01-17 NOTE — Patient Instructions (Signed)
[place] Discharge Instructions for Patients Receiving Chemotherapy  Today you received the following chemotherapy agents: taxol, carboplatin  To help prevent nausea and vomiting after your treatment, we encourage you to take your nausea medication as prescribed.   If you develop nausea and vomiting that is not controlled by your nausea medication, call the clinic.   BELOW ARE SYMPTOMS THAT SHOULD BE REPORTED IMMEDIATELY:  *FEVER GREATER THAN 100.5 F  *CHILLS WITH OR WITHOUT FEVER  NAUSEA AND VOMITING THAT IS NOT CONTROLLED WITH YOUR NAUSEA MEDICATION  *UNUSUAL SHORTNESS OF BREATH  *UNUSUAL BRUISING OR BLEEDING  TENDERNESS IN MOUTH AND THROAT WITH OR WITHOUT PRESENCE OF ULCERS  *URINARY PROBLEMS  *BOWEL PROBLEMS  UNUSUAL RASH Items with * indicate a potential emergency and should be followed up as soon as possible.  Feel free to call the clinic should you have any questions or concerns. The clinic phone number is [phone].  Please show the [place] at check-in to the Emergency Department and triage nurse.

## 2020-01-17 NOTE — Progress Notes (Signed)
01/17/20  No change in premedications at this time.  Will evaluate if reaction occurs with cycle #2.  T.O. Dr Magrinat/Valerie Nicola Girt, RN/Seif Teichert Ronnald Ramp, PharmD

## 2020-01-17 NOTE — Progress Notes (Signed)
At 1508 pt alerted writer that she was feeling hot and short of breath, and appeared red in her face. Taxol was stopped, NS infusion was initiated, and O2 applied. Sandi Mealy called and immediately came to assess. At 1510, benadryl 25mg  IV given, followed by Pepcid 20mg  IV at 1513. Vital signs: BP-153/101, P-100, O2-100% on 3L [place]. At 1520, Solumedrol 60mg  IV was given. At this time, pt reported improvement in her symptoms. Taxol was restarted around 1545 and continues at this time without further issues.

## 2020-01-18 NOTE — Progress Notes (Signed)
Pharmacist Chemotherapy Monitoring - Follow Up Assessment    I verify that I have reviewed each item in the below checklist:  . Regimen for the patient is scheduled for the appropriate day and plan matches scheduled date. Marland Kitchen Appropriate non-routine labs are ordered dependent on drug ordered. . If applicable, additional medications reviewed and ordered per protocol based on lifetime cumulative doses and/or treatment regimen.   Plan for follow-up and/or issues identified: Yes . I-vent associated with next due treatment: Yes . MD and/or nursing notified: Yes  Krista [name] 01/18/2020 10:11 AM

## 2020-01-19 ENCOUNTER — Telehealth: Payer: Self-pay | Admitting: Oncology

## 2020-01-19 ENCOUNTER — Other Ambulatory Visit: Payer: Self-pay | Admitting: Oncology

## 2020-01-19 NOTE — Telephone Encounter (Signed)
Scheduled appts between appts that were already scheduled per 7/20 los. Made no changes to arrival times.

## 2020-01-20 NOTE — Progress Notes (Signed)
    DATE:  01/17/2020                                          X  CHEMO/IMMUNOTHERAPY REACTION            MD:  Dr. Jana Hakim   AGENT/BLOOD PRODUCT RECEIVING TODAY:               Carboplatin and paclitaxel   AGENT/BLOOD PRODUCT RECEIVING IMMEDIATELY PRIOR TO REACTION:           Paclitaxel   VS: BP:      153/101   P:        100       SPO2:        99% on room air                  REACTION(S):            Hot flashes, flushing, facial erythema, and cramping of the lower back   PREMEDS:      Benadryl 25 mg, dexamethasone 10 mg IV, Aloxi 0.25 mg, Pepcid 20 mg, and Ativan 0.5 mg.   INTERVENTION: Paclitaxel was paused and the patient was given an additional dose of Benadryl 25 mg IV, Pepcid 20 mg IV, and Solu-Medrol 60 mg IV.   Review of Systems  Review of Systems  Constitutional: Negative for chills, diaphoresis and fever.       Flushing  HENT: Negative for trouble swallowing and voice change.   Respiratory: Negative for cough, chest tightness, shortness of breath and wheezing.   Cardiovascular: Negative for chest pain and palpitations.  Gastrointestinal: Negative for abdominal pain, constipation, diarrhea, nausea and vomiting.  Musculoskeletal: Positive for back pain. Negative for myalgias.  Skin: Positive for color change.       Facial erythema  Neurological: Negative for dizziness, light-headedness and headaches.     Physical Exam  Physical Exam Constitutional:      General: She is not in acute distress.    Appearance: She is not diaphoretic.  HENT:     Head: Normocephalic and atraumatic.  Cardiovascular:     Rate and Rhythm: Normal rate and regular rhythm.     Heart sounds: Normal heart sounds. No murmur heard.  No friction rub. No gallop.   Pulmonary:     Effort: Pulmonary effort is normal. No respiratory distress.     Breath sounds: Normal breath sounds. No wheezing or rales.  Skin:    General: Skin is warm and dry.     Findings: Erythema present. No rash.      Comments: Facial erythema was noted.  This resolved after paclitaxel was paused and the patient was given  Neurological:     Mental Status: She is alert.    Is OUTCOME:                 The patient's symptoms abated after the above intervention.  She was able to restart and complete paclitaxel completed dosing with carboplatin.  She had no other issues of concern today.   Sandi Mealy, MHS, PA-C

## 2020-01-24 ENCOUNTER — Inpatient Hospital Stay: Payer: BC Managed Care – PPO

## 2020-01-24 ENCOUNTER — Other Ambulatory Visit: Payer: Self-pay

## 2020-01-24 VITALS — BP 135/93 | HR 96 | Temp 98.4°F | Resp 18 | Wt 210.5 lb

## 2020-01-24 DIAGNOSIS — Z95828 Presence of other vascular implants and grafts: Secondary | ICD-10-CM

## 2020-01-24 DIAGNOSIS — C50211 Malignant neoplasm of upper-inner quadrant of right female breast: Secondary | ICD-10-CM | POA: Diagnosis not present

## 2020-01-24 DIAGNOSIS — D563 Thalassemia minor: Secondary | ICD-10-CM

## 2020-01-24 DIAGNOSIS — Z171 Estrogen receptor negative status [ER-]: Secondary | ICD-10-CM

## 2020-01-24 DIAGNOSIS — C50411 Malignant neoplasm of upper-outer quadrant of right female breast: Secondary | ICD-10-CM

## 2020-01-24 LAB — COMPREHENSIVE METABOLIC PANEL
ALT: 19 U/L (ref 0–44)
AST: 16 U/L (ref 15–41)
Albumin: 3.8 g/dL (ref 3.5–5.0)
Alkaline Phosphatase: 64 U/L (ref 38–126)
Anion gap: 8 (ref 5–15)
BUN: 10 mg/dL (ref 6–20)
CO2: 25 mmol/L (ref 22–32)
Calcium: 9.7 mg/dL (ref 8.9–10.3)
Chloride: 108 mmol/L (ref 98–111)
Creatinine, Ser: 0.75 mg/dL (ref 0.44–1.00)
GFR calc Af Amer: >60 mL/min (ref >60)
GFR calc non Af Amer: >60 mL/min (ref >60)
Glucose, Bld: 124 mg/dL — ABNORMAL HIGH (ref 70–99)
Potassium: 3.7 mmol/L (ref 3.5–5.1)
Sodium: 141 mmol/L (ref 135–145)
Total Bilirubin: 0.7 mg/dL (ref 0.3–1.2)
Total Protein: 6.7 g/dL (ref 6.5–8.1)

## 2020-01-24 LAB — CBC WITH DIFFERENTIAL/PLATELET
Abs Immature Granulocytes: 0 10*3/uL (ref 0.00–0.07)
Basophils Absolute: 0.1 10*3/uL (ref 0.0–0.1)
Basophils Relative: 2 %
Eosinophils Absolute: 0 10*3/uL (ref 0.0–0.5)
Eosinophils Relative: 1 %
HCT: 24.7 % — ABNORMAL LOW (ref 36.0–46.0)
Hemoglobin: 8.8 g/dL — ABNORMAL LOW (ref 12.0–15.0)
Immature Granulocytes: 0 %
Lymphocytes Relative: 28 %
Lymphs Abs: 0.9 10*3/uL (ref 0.7–4.0)
MCH: 26.7 pg (ref 26.0–34.0)
MCHC: 35.6 g/dL (ref 30.0–36.0)
MCV: 74.8 fL — ABNORMAL LOW (ref 80.0–100.0)
Monocytes Absolute: 0.2 10*3/uL (ref 0.1–1.0)
Monocytes Relative: 6 %
Neutro Abs: 2 10*3/uL (ref 1.7–7.7)
Neutrophils Relative %: 63 %
Platelets: 275 10*3/uL (ref 150–400)
RBC: 3.3 MIL/uL — ABNORMAL LOW (ref 3.87–5.11)
RDW: 20.9 % — ABNORMAL HIGH (ref 11.5–15.5)
WBC: 3.1 10*3/uL — ABNORMAL LOW (ref 4.0–10.5)
nRBC: 0 % (ref 0.0–0.2)

## 2020-01-24 MED ORDER — DIPHENHYDRAMINE HCL 50 MG/ML IJ SOLN
25.0000 mg | Freq: Once | INTRAMUSCULAR | Status: AC
  Administered 2020-01-24: 25 mg via INTRAVENOUS

## 2020-01-24 MED ORDER — SODIUM CHLORIDE 0.9 % IV SOLN
300.0000 mg | Freq: Once | INTRAVENOUS | Status: AC
  Administered 2020-01-24: 300 mg via INTRAVENOUS
  Filled 2020-01-24: qty 30

## 2020-01-24 MED ORDER — HEPARIN SOD (PORK) LOCK FLUSH 100 UNIT/ML IV SOLN
500.0000 [IU] | Freq: Once | INTRAVENOUS | Status: AC | PRN
  Administered 2020-01-24: 500 [IU]
  Filled 2020-01-24: qty 5

## 2020-01-24 MED ORDER — LORAZEPAM 2 MG/ML IJ SOLN
0.5000 mg | Freq: Once | INTRAMUSCULAR | Status: AC
  Administered 2020-01-24: 0.5 mg via INTRAVENOUS

## 2020-01-24 MED ORDER — FAMOTIDINE IN NACL 20-0.9 MG/50ML-% IV SOLN
20.0000 mg | Freq: Once | INTRAVENOUS | Status: AC
  Administered 2020-01-24: 20 mg via INTRAVENOUS

## 2020-01-24 MED ORDER — DIPHENHYDRAMINE HCL 50 MG/ML IJ SOLN
INTRAMUSCULAR | Status: AC
  Filled 2020-01-24: qty 1

## 2020-01-24 MED ORDER — SODIUM CHLORIDE 0.9% FLUSH
10.0000 mL | INTRAVENOUS | Status: DC | PRN
  Filled 2020-01-24: qty 10

## 2020-01-24 MED ORDER — FAMOTIDINE IN NACL 20-0.9 MG/50ML-% IV SOLN
INTRAVENOUS | Status: AC
  Filled 2020-01-24: qty 50

## 2020-01-24 MED ORDER — LORAZEPAM 2 MG/ML IJ SOLN
INTRAMUSCULAR | Status: AC
  Filled 2020-01-24: qty 1

## 2020-01-24 MED ORDER — SODIUM CHLORIDE 0.9 % IV SOLN
Freq: Once | INTRAVENOUS | Status: AC
  Filled 2020-01-24: qty 250

## 2020-01-24 MED ORDER — SODIUM CHLORIDE 0.9% FLUSH
10.0000 mL | INTRAVENOUS | Status: DC | PRN
  Administered 2020-01-24 (×2): 10 mL
  Filled 2020-01-24: qty 10

## 2020-01-24 MED ORDER — SODIUM CHLORIDE 0.9 % IV SOLN
80.0000 mg/m2 | Freq: Once | INTRAVENOUS | Status: AC
  Administered 2020-01-24: 162 mg via INTRAVENOUS
  Filled 2020-01-24: qty 27

## 2020-01-24 MED ORDER — PALONOSETRON HCL INJECTION 0.25 MG/5ML
0.2500 mg | Freq: Once | INTRAVENOUS | Status: AC
  Administered 2020-01-24: 0.25 mg via INTRAVENOUS

## 2020-01-24 MED ORDER — SODIUM CHLORIDE 0.9 % IV SOLN
20.0000 mg | Freq: Once | INTRAVENOUS | Status: AC
  Administered 2020-01-24: 20 mg via INTRAVENOUS
  Filled 2020-01-24: qty 20

## 2020-01-24 MED ORDER — PALONOSETRON HCL INJECTION 0.25 MG/5ML
INTRAVENOUS | Status: AC
  Filled 2020-01-24: qty 5

## 2020-01-24 NOTE — Patient Instructions (Signed)
[facility] Discharge Instructions for Patients Receiving Chemotherapy  Today you received the following chemotherapy agents Paclitaxel (TAXOL) & Carboplatin (PARAPLATIN).  To help prevent nausea and vomiting after your treatment, we encourage you to take your nausea medication as prescribed.  If you develop nausea and vomiting that is not controlled by your nausea medication, call the clinic.   BELOW ARE SYMPTOMS THAT SHOULD BE REPORTED IMMEDIATELY:  *FEVER GREATER THAN 100.5 F  *CHILLS WITH OR WITHOUT FEVER  NAUSEA AND VOMITING THAT IS NOT CONTROLLED WITH YOUR NAUSEA MEDICATION  *UNUSUAL SHORTNESS OF BREATH  *UNUSUAL BRUISING OR BLEEDING  TENDERNESS IN MOUTH AND THROAT WITH OR WITHOUT PRESENCE OF ULCERS  *URINARY PROBLEMS  *BOWEL PROBLEMS  UNUSUAL RASH Items with * indicate a potential emergency and should be followed up as soon as possible.  Feel free to call the clinic should you have any questions or concerns. The clinic phone number is [phone].  Please show the CHEMO ALERT CARD at check-in to the Emergency Department and triage nurse.   

## 2020-01-30 NOTE — Progress Notes (Signed)
[place]  [phone] Fax:([phone]) [phone]     ID: Krista [name] DOB: 06-20-1976  MR#: [id number]  WPY#:[id number]  Patient Care Team: Patient, No Pcp Per as PCP - General (General Practice) Krista [name], [name], MD as Consulting Physician (Oncology) Erroll Luna, MD as Consulting Physician (General Surgery) Eppie Gibson, MD as Attending Physician (Radiation Oncology) Rockwell Germany, RN as Oncology Nurse Navigator Mauro Kaufmann, RN as Oncology Nurse Navigator Chauncey Cruel, MD OTHER MD:   CHIEF COMPLAINT: Triple negative breast cancer  CURRENT TREATMENT: Neoadjuvant chemotherapy   INTERVAL HISTORY: Krista [name] returns today for follow-up and treatment of her triple negative breast cancer, accompanied by her daughter  She started carboplatin and paclitaxel on 01/10/2020.  Today is week 4 out of a planned 12 doses.   REVIEW OF SYSTEMS: Krista [name] is tolerating treatment moderately well.  The main issue she is having right now is that at night after supper she gets gurgly stomach and then some discomfort and has to have a bowel movement.  The bowel movement is not diarrheal.  She also has a normal bowel movement in the morning.  She has had no peripheral neuropathy symptoms.  She is getting some darkening of her nails, not so much the skin.  She walks her dog about 15 minutes a day.  She has had no.  Since the chemo started and she is getting some hot flashes which are waking her up at night.  A detailed review of systems today was otherwise stable.   HISTORY OF CURRENT ILLNESS: From the original intake note:  Krista [name] [facility] Krista [name]"] herself palpated a lump in the upper-inner right breast sometime late December 2020 or early January 2021.  As the mass grew larger and persisted through menstrual periods she brought it to medical attention and underwent bilateral diagnostic mammography with tomography and bilateral breast  ultrasonography at [facility] on 09/26/2019 showing: breast density category C; 5.1 cm mass involving upper-inner quadrant of right breast; no evidence of right axillary lymphadenopathy or left breast malignancy.  Accordingly on 10/03/2019 she proceeded to biopsy of the right breast area in question. The pathology from this procedure ([id number]) showed: invasive ductal carcinoma, grade 3. Prognostic indicators significant for: estrogen receptor, 0% negative and progesterone receptor, 0% negative. Proliferation marker Ki67 at 80%. HER2 negative by immunohistochemistry (1+).  The patient's subsequent history is as detailed below.   PAST MEDICAL HISTORY: Past Medical History:  Diagnosis Date  . Anxiety   . Cancer ([place])   . Family history of breast cancer   . Family history of cervical cancer   . Family history of lung cancer   . Hemorrhoids     PAST SURGICAL HISTORY: No prior surgeries   FAMILY HISTORY: Family History  Problem Relation Age of Onset  . Breast cancer Mother 58       double mastectomy  . Breast cancer Half-Sister 27  . Cirrhosis Half-Sister   . Heart Problems Father   . Stroke Father   . Breast cancer Maternal Aunt 69  . Cancer Maternal Uncle 25       unknown type  . Ovarian cancer Maternal Grandmother        dx. in her mid-40s  . Lung cancer Paternal Grandmother   . Cancer Other        unknown type; maternal great-uncles/aunts (x3)  . Diabetes Half-Brother   . Cancer Other  unknown type; matenral great-uncles/aunt (x4)  . Cancer Maternal Great-grandfather        unknown type  . Breast cancer Cousin 75       paternal cousin  The patient's father died at age 18 from cardiac complications of drug use.  His mother, the patient's paternal grandmother had what seems to have been cancer of the lung metastatic to the spine.  There was no other cancer on his side of the family to the patient's knowledge.  The patient's mother died at age 32 in the setting  of multiple medical issues but she had undergone double mastectomy at the age of 69.  Her mother, the patient's maternal grandmother had cervical cancer.  The patient's mother had 3 brothers and 3 sisters.  1 of those 3 sisters had breast cancer diagnosed at the age of 6.  The patient herself has 2 brothers and 3 sisters one of her sisters was diagnosed with breast cancer at the age of 22 and died at the age of 40   GYNECOLOGIC HISTORY:  No LMP recorded. Menarche: 44 years old Age at first live birth: 44 years old [place] P 3 LMP regular, last approximately 5 days, of which the second day is heavy Contraceptive: Husband status post vasectomy HRT no  Hysterectomy? no BSO?  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  Her husband Krista [name] is a Research scientist (medical) and also Librarian, academic at [employer].  Their children are Krista [name], 20, who is Scientist, product/process development at [employer], 18, who will be going to [employer] this coming year and then switch over to [employer], [employer] and aiming for an art rehabilitation degree; and Krista [name], 65, currently attending [employer].  The patient is not a church attender    ADVANCED DIRECTIVES: In the absence of any documents to the contrary the patient's husband is her healthcare power of attorney   HEALTH MAINTENANCE: Social History   Tobacco Use  . Smoking status: Never Smoker  . Smokeless tobacco: Never Used  Substance Use Topics  . Alcohol use: Not Currently    Comment: Social drinker  . Drug use: Never     Colonoscopy: n/a (age)  PAP: UTD  Bone density: n/a (age)   Allergies  Allergen Reactions  . Emend [Fosaprepitant] Shortness Of Breath    Current Outpatient Medications  Medication Sig Dispense Refill  . acetaminophen (TYLENOL) 500 MG tablet Take 1-2 tablets (500-1,000 mg total) by mouth every 6 (six) hours as needed. (Patient taking differently: Take 500-1,000 mg by mouth  every 6 (six) hours as needed for moderate pain or fever. ) 60 tablet 0  . desloratadine (CLARINEX) 5 MG tablet Take 5 mg by mouth daily.    Marland Kitchen dexamethasone (DECADRON) 4 MG tablet Take 2 tablets by mouth daily starting the day after Cytoxan x 3 days. Take with food. (Patient taking differently: Take 8 mg by mouth daily. ) 30 tablet 1  . lidocaine-prilocaine (EMLA) cream Apply to affected area once 30 g 3  . loratadine (CLARITIN) 10 MG tablet Take 1 tablet (10 mg total) by mouth daily. 60 tablet 0  . LORazepam (ATIVAN) 0.5 MG tablet Take 1 tablet (0.5 mg total) by mouth at bedtime as needed (Nausea or vomiting). 30 tablet 0  . Multiple Vitamins-Minerals (MULTIVITAMIN WITH MINERALS) tablet Take 2 tablets by mouth daily.    . prochlorperazine (COMPAZINE) 10 MG tablet Take 1  tablet (10 mg total) by mouth every 6 (six) hours as needed (Nausea or vomiting). 30 tablet 1  . valACYclovir (VALTREX) 1000 MG tablet TAKE 1 TABLET BY MOUTH EVERY DAY 90 tablet 1   No current facility-administered medications for this visit.    OBJECTIVE: African-American woman who appears stated age  44:   01/31/20 1044  BP: (!) 135/94  Pulse: 99  Resp: 18  Temp: 98.5 F (36.9 C)  SpO2: 100%   Wt Readings from Last 3 Encounters:  01/31/20 210 lb 11.2 oz (95.6 kg)  01/24/20 (!) 210 lb 8 oz (95.5 kg)  01/17/20 212 lb 12.8 oz (96.5 kg)   Body mass index is 33 kg/m.    ECOG FS:1 - Symptomatic but completely ambulatory  Sclerae unicteric, EOMs intact Wearing a mask No cervical or supraclavicular adenopathy Lungs no rales or rhonchi Heart regular rate and rhythm Abd soft, nontender, positive bowel sounds MSK no focal spinal tenderness, no upper extremity lymphedema Neuro: nonfocal, well oriented, appropriate affect Breasts: I do not palpate a mass in either breast.  There are no skin or nipple changes of concern.  Both axillae are benign.     LAB RESULTS:  CMP     Component Value Date/Time   NA 141  01/24/2020 1059   K 3.7 01/24/2020 1059   CL 108 01/24/2020 1059   CO2 25 01/24/2020 1059   GLUCOSE 124 (H) 01/24/2020 1059   BUN 10 01/24/2020 1059   CREATININE 0.75 01/24/2020 1059   CREATININE 0.95 10/17/2019 1529   CALCIUM 9.7 01/24/2020 1059   PROT 6.7 01/24/2020 1059   ALBUMIN 3.8 01/24/2020 1059   AST 16 01/24/2020 1059   AST 16 10/17/2019 1529   ALT 19 01/24/2020 1059   ALT 13 10/17/2019 1529   ALKPHOS 64 01/24/2020 1059   BILITOT 0.7 01/24/2020 1059   BILITOT 1.1 10/17/2019 1529   GFRNONAA >60 01/24/2020 1059   GFRNONAA >60 10/17/2019 1529   GFRAA >60 01/24/2020 1059   GFRAA >60 10/17/2019 1529    No results found for: TOTALPROTELP, ALBUMINELP, A1GS, A2GS, BETS, BETA2SER, GAMS, MSPIKE, SPEI  Lab Results  Component Value Date   WBC 3.1 (L) 01/24/2020   NEUTROABS 2.0 01/24/2020   HGB 8.8 (L) 01/24/2020   HCT 24.7 (L) 01/24/2020   MCV 74.8 (L) 01/24/2020   PLT 275 01/24/2020    No results found for: LABCA2  No components found for: BTDVVO160  No results for input(s): INR in the last 168 hours.  No results found for: LABCA2  No results found for: VPX106  No results found for: YIR485  No results found for: IOE703  Lab Results  Component Value Date   CA2729 29.8 11/16/2019    No components found for: HGQUANT  No results found for: CEA1 / No results found for: CEA1   No results found for: AFPTUMOR  No results found for: CHROMOGRNA  No results found for: KPAFRELGTCHN, LAMBDASER, KAPLAMBRATIO (kappa/lambda light chains)  No results found for: HGBA, HGBA2QUANT, HGBFQUANT, HGBSQUAN (Hemoglobinopathy evaluation)   No results found for: LDH  Lab Results  Component Value Date   IRON 99 10/17/2019   TIBC 391 10/17/2019   IRONPCTSAT 25 10/17/2019   (Iron and TIBC)  Lab Results  Component Value Date   FERRITIN 45 10/17/2019    Urinalysis No results found for: COLORURINE, APPEARANCEUR, LABSPEC, PHURINE, GLUCOSEU, HGBUR, BILIRUBINUR,  KETONESUR, PROTEINUR, UROBILINOGEN, NITRITE, LEUKOCYTESUR   STUDIES: No results found.   ELIGIBLE FOR AVAILABLE RESEARCH  PROTOCOL: [id number]  ASSESSMENT: 44 y.o. [place] woman status post right breast upper inner quadrant biopsy 10/03/2019 for a clinical mT3 N1, stage IIIC invasive ductal carcinoma, grade 3, triple negative, with an MIB-1 of 80%  (a) biopsy of a right axillary node 2019-11-09 showed carcinoma  (b) CT chest/abd/pelvis 10/31/2019 shows 1.5 cm hypodense lesion in liver, lung nodules <0.3 cm  (c) bone scan 10/31/2019 negative  (d) liver MRI 11/11/2019 confirms a targetoid lesion in the left hepatic lobe suspicious for metastasis   (i) liver biopsy 2019-11-23 showed no evidence of malignancy (benign).  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*14)   (a) two variants of uncertain significance were noted - one in the [facility] gene called c.1295T>G and one in the POLD1 gene called c.34G>A  (b) no additional mutations of concern were found in AIP, ALK, APC, ATM, AXIN2,BAP1,  BARD1, BLM, BMPR1A, BRCA1, BRCA2, BRIP1, CASR, CDC73, CDH1, CDK4, CDKN1B, CDKN1C, CDKN2A (p14ARF), CDKN2A (p16INK4a), CEBPA, CHEK2, CTNNA1, DICER1, DIS3L2, EGFR (c.2369C>T, p.Thr790Met variant only), EPCAM (Deletion/duplication testing only), FH, FLCN, GATA2, GPC3, GREM1 (Promoter region deletion/duplication testing only), HOXB13 (c.251G>A, p.Gly84Glu), HRAS, KIT, MAX, MEN1, MET, MITF (c.952G>A, p.Glu318Lys variant only), MLH1, MSH2, MSH3, MSH6, MUTYH, NBN, NF1, NF2, NTHL1, PALB2, PDGFRA, PHOX2B, PMS2, POLD1, POLE, POT1, PRKAR1A, PTCH1, PTEN, RAD50, RAD51C, RAD51D, RB1, RECQL4, RET, RNF43, RUNX1, SDHAF2, SDHA (sequence changes only), SDHB, SDHC, SDHD, SMAD4, SMARCA4, SMARCB1, SMARCE1, STK11, SUFU, TERC, TERT, TMEM127, TP53, TSC1, TSC2, VHL, WRN and WT1.  (2)  neoadjuvant chemotherapy consisting of cyclophosphamide and doxorubicin in dose dense fashion x4 started 11/16/2019 and completed 12/27/2019, followed by paclitaxel and carboplatin weekly x12 starting 01/10/2020  (a) echo 10/24/2019 shows an ejection fraction in the 60-65% range  (3) definitive surgery to follow  (4) adjuvant radiation  (5) hemoglobin C trait:  (a) ferritin 10/17/2019 was 45 with saturation 25%, hemoglobin 11.8 and MCV 71.5  (b) hemoglobin electrophoresis 2019-11-22 showed: A: 62.6%, A2: 3.7%, C: 33.2%, F:0 5.1%, S: 0%   PLAN: Krista [name] is tolerating her chemotherapy well and clinically she is having an excellent response.  She will proceed with cycle 4 of paclitaxel and carboplatin today.  I do not think we need to do anything about the GI discomfort she has in the evenings.  She is keeping herself well-hydrated.  Of course she is having hot flashes now and it may be that she is entering permanent menopause although a good 40% of women like her will regain their menstrual periods within a year of discontinuing chemotherapy.  It is very favorable that do not palpate a residual mass in the breast at this point.  I offered her gabapentin at bedtime for the nighttime hot flashes but what she is doing is installing a fan instead.  From this point we are going to see her every other week even though she continues to be treated weekly.  If she has any concern about being treated and she is not scheduled to see me or one of our advanced practice providers she will insist on seeing someone before getting treated  Total encounter time 35 minutes.   [name]. Barbarann Kelly, MD 01/31/20 11:02 AM Medical Oncology and Hematology [facility] [place], [place] [address] Tel. [phone]    Fax. [phone]   I, Wilburn Mylar, am acting as scribe for Dr. [name]. Brandye Inthavong.  I, Lurline Del MD, have reviewed the above documentation for accuracy and  completeness, and I agree with the above.   *Total Encounter Time as defined by the Centers for Medicare and Medicaid Services includes, in addition to the face-to-face time of a patient visit (documented in the note above) non-face-to-face time: obtaining and reviewing outside history, ordering and reviewing medications, tests or procedures, care coordination (communications with other health care professionals or caregivers) and documentation in the medical record.

## 2020-01-31 ENCOUNTER — Other Ambulatory Visit: Payer: Self-pay

## 2020-01-31 ENCOUNTER — Inpatient Hospital Stay (HOSPITAL_BASED_OUTPATIENT_CLINIC_OR_DEPARTMENT_OTHER): Payer: BC Managed Care – PPO | Admitting: Oncology

## 2020-01-31 ENCOUNTER — Inpatient Hospital Stay: Payer: BC Managed Care – PPO

## 2020-01-31 ENCOUNTER — Inpatient Hospital Stay: Payer: BC Managed Care – PPO | Attending: Oncology

## 2020-01-31 VITALS — BP 135/94 | HR 99 | Temp 98.5°F | Resp 18 | Ht 67.0 in | Wt 210.7 lb

## 2020-01-31 DIAGNOSIS — C50211 Malignant neoplasm of upper-inner quadrant of right female breast: Secondary | ICD-10-CM | POA: Diagnosis present

## 2020-01-31 DIAGNOSIS — D563 Thalassemia minor: Secondary | ICD-10-CM

## 2020-01-31 DIAGNOSIS — C50411 Malignant neoplasm of upper-outer quadrant of right female breast: Secondary | ICD-10-CM

## 2020-01-31 DIAGNOSIS — Z8049 Family history of malignant neoplasm of other genital organs: Secondary | ICD-10-CM | POA: Insufficient documentation

## 2020-01-31 DIAGNOSIS — Z171 Estrogen receptor negative status [ER-]: Secondary | ICD-10-CM

## 2020-01-31 DIAGNOSIS — Z803 Family history of malignant neoplasm of breast: Secondary | ICD-10-CM | POA: Diagnosis not present

## 2020-01-31 DIAGNOSIS — Z1501 Genetic susceptibility to malignant neoplasm of breast: Secondary | ICD-10-CM

## 2020-01-31 DIAGNOSIS — Z5111 Encounter for antineoplastic chemotherapy: Secondary | ICD-10-CM | POA: Insufficient documentation

## 2020-01-31 DIAGNOSIS — Z801 Family history of malignant neoplasm of trachea, bronchus and lung: Secondary | ICD-10-CM | POA: Diagnosis not present

## 2020-01-31 DIAGNOSIS — Z7189 Other specified counseling: Secondary | ICD-10-CM | POA: Diagnosis not present

## 2020-01-31 DIAGNOSIS — C50911 Malignant neoplasm of unspecified site of right female breast: Secondary | ICD-10-CM

## 2020-01-31 LAB — COMPREHENSIVE METABOLIC PANEL
ALT: 18 U/L (ref 0–44)
AST: 19 U/L (ref 15–41)
Albumin: 3.8 g/dL (ref 3.5–5.0)
Alkaline Phosphatase: 70 U/L (ref 38–126)
Anion gap: 8 (ref 5–15)
BUN: 12 mg/dL (ref 6–20)
CO2: 23 mmol/L (ref 22–32)
Calcium: 9.8 mg/dL (ref 8.9–10.3)
Chloride: 111 mmol/L (ref 98–111)
Creatinine, Ser: 0.76 mg/dL (ref 0.44–1.00)
GFR calc Af Amer: >60 mL/min (ref >60)
GFR calc non Af Amer: >60 mL/min (ref >60)
Glucose, Bld: 96 mg/dL (ref 70–99)
Potassium: 3.7 mmol/L (ref 3.5–5.1)
Sodium: 142 mmol/L (ref 135–145)
Total Bilirubin: 0.7 mg/dL (ref 0.3–1.2)
Total Protein: 6.8 g/dL (ref 6.5–8.1)

## 2020-01-31 LAB — CBC WITH DIFFERENTIAL/PLATELET
Abs Immature Granulocytes: 0.02 10*3/uL (ref 0.00–0.07)
Basophils Absolute: 0 10*3/uL (ref 0.0–0.1)
Basophils Relative: 1 %
Eosinophils Absolute: 0 10*3/uL (ref 0.0–0.5)
Eosinophils Relative: 1 %
HCT: 24.2 % — ABNORMAL LOW (ref 36.0–46.0)
Hemoglobin: 8.8 g/dL — ABNORMAL LOW (ref 12.0–15.0)
Immature Granulocytes: 1 %
Lymphocytes Relative: 22 %
Lymphs Abs: 0.8 10*3/uL (ref 0.7–4.0)
MCH: 28 pg (ref 26.0–34.0)
MCHC: 36.4 g/dL — ABNORMAL HIGH (ref 30.0–36.0)
MCV: 77.1 fL — ABNORMAL LOW (ref 80.0–100.0)
Monocytes Absolute: 0.2 10*3/uL (ref 0.1–1.0)
Monocytes Relative: 7 %
Neutro Abs: 2.4 10*3/uL (ref 1.7–7.7)
Neutrophils Relative %: 68 %
Platelets: 247 10*3/uL (ref 150–400)
RBC: 3.14 MIL/uL — ABNORMAL LOW (ref 3.87–5.11)
RDW: 21.3 % — ABNORMAL HIGH (ref 11.5–15.5)
WBC: 3.5 10*3/uL — ABNORMAL LOW (ref 4.0–10.5)
nRBC: 0 % (ref 0.0–0.2)

## 2020-01-31 MED ORDER — HEPARIN SOD (PORK) LOCK FLUSH 100 UNIT/ML IV SOLN
500.0000 [IU] | Freq: Once | INTRAVENOUS | Status: AC | PRN
  Administered 2020-01-31: 500 [IU]
  Filled 2020-01-31: qty 5

## 2020-01-31 MED ORDER — PALONOSETRON HCL INJECTION 0.25 MG/5ML
0.2500 mg | Freq: Once | INTRAVENOUS | Status: AC
  Administered 2020-01-31: 0.25 mg via INTRAVENOUS

## 2020-01-31 MED ORDER — SODIUM CHLORIDE 0.9 % IV SOLN
300.0000 mg | Freq: Once | INTRAVENOUS | Status: AC
  Administered 2020-01-31: 300 mg via INTRAVENOUS
  Filled 2020-01-31: qty 30

## 2020-01-31 MED ORDER — FAMOTIDINE IN NACL 20-0.9 MG/50ML-% IV SOLN
20.0000 mg | Freq: Once | INTRAVENOUS | Status: AC
  Administered 2020-01-31: 20 mg via INTRAVENOUS

## 2020-01-31 MED ORDER — DIPHENHYDRAMINE HCL 50 MG/ML IJ SOLN
25.0000 mg | Freq: Once | INTRAMUSCULAR | Status: AC
  Administered 2020-01-31: 25 mg via INTRAVENOUS

## 2020-01-31 MED ORDER — SODIUM CHLORIDE 0.9% FLUSH
10.0000 mL | INTRAVENOUS | Status: DC | PRN
  Administered 2020-01-31: 10 mL
  Filled 2020-01-31: qty 10

## 2020-01-31 MED ORDER — FAMOTIDINE IN NACL 20-0.9 MG/50ML-% IV SOLN
INTRAVENOUS | Status: AC
  Filled 2020-01-31: qty 50

## 2020-01-31 MED ORDER — DIPHENHYDRAMINE HCL 50 MG/ML IJ SOLN
INTRAMUSCULAR | Status: AC
  Filled 2020-01-31: qty 1

## 2020-01-31 MED ORDER — LORAZEPAM 2 MG/ML IJ SOLN
INTRAMUSCULAR | Status: AC
  Filled 2020-01-31: qty 1

## 2020-01-31 MED ORDER — LORAZEPAM 2 MG/ML IJ SOLN
0.5000 mg | Freq: Once | INTRAMUSCULAR | Status: AC
  Administered 2020-01-31: 0.5 mg via INTRAVENOUS

## 2020-01-31 MED ORDER — SODIUM CHLORIDE 0.9 % IV SOLN
80.0000 mg/m2 | Freq: Once | INTRAVENOUS | Status: AC
  Administered 2020-01-31: 162 mg via INTRAVENOUS
  Filled 2020-01-31: qty 27

## 2020-01-31 MED ORDER — PALONOSETRON HCL INJECTION 0.25 MG/5ML
INTRAVENOUS | Status: AC
  Filled 2020-01-31: qty 5

## 2020-01-31 MED ORDER — SODIUM CHLORIDE 0.9 % IV SOLN
20.0000 mg | Freq: Once | INTRAVENOUS | Status: AC
  Administered 2020-01-31: 20 mg via INTRAVENOUS
  Filled 2020-01-31: qty 20

## 2020-01-31 MED ORDER — SODIUM CHLORIDE 0.9 % IV SOLN
Freq: Once | INTRAVENOUS | Status: AC
  Filled 2020-01-31: qty 250

## 2020-01-31 NOTE — Patient Instructions (Signed)
[facility] Discharge Instructions for Patients Receiving Chemotherapy  Today you received the following chemotherapy agents Paclitaxel (TAXOL) & Carboplatin (PARAPLATIN).  To help prevent nausea and vomiting after your treatment, we encourage you to take your nausea medication as prescribed.  If you develop nausea and vomiting that is not controlled by your nausea medication, call the clinic.   BELOW ARE SYMPTOMS THAT SHOULD BE REPORTED IMMEDIATELY:  *FEVER GREATER THAN 100.5 F  *CHILLS WITH OR WITHOUT FEVER  NAUSEA AND VOMITING THAT IS NOT CONTROLLED WITH YOUR NAUSEA MEDICATION  *UNUSUAL SHORTNESS OF BREATH  *UNUSUAL BRUISING OR BLEEDING  TENDERNESS IN MOUTH AND THROAT WITH OR WITHOUT PRESENCE OF ULCERS  *URINARY PROBLEMS  *BOWEL PROBLEMS  UNUSUAL RASH Items with * indicate a potential emergency and should be followed up as soon as possible.  Feel free to call the clinic should you have any questions or concerns. The clinic phone number is [phone].  Please show the CHEMO ALERT CARD at check-in to the Emergency Department and triage nurse.   

## 2020-02-01 ENCOUNTER — Telehealth: Payer: Self-pay | Admitting: Oncology

## 2020-02-01 NOTE — Telephone Encounter (Signed)
No 8/3 los. No changes made to pt's schedule.

## 2020-02-07 ENCOUNTER — Inpatient Hospital Stay: Payer: BC Managed Care – PPO

## 2020-02-07 ENCOUNTER — Other Ambulatory Visit: Payer: Self-pay

## 2020-02-07 VITALS — BP 142/96 | HR 95 | Temp 98.6°F | Resp 18

## 2020-02-07 DIAGNOSIS — C50211 Malignant neoplasm of upper-inner quadrant of right female breast: Secondary | ICD-10-CM

## 2020-02-07 DIAGNOSIS — Z171 Estrogen receptor negative status [ER-]: Secondary | ICD-10-CM

## 2020-02-07 DIAGNOSIS — D563 Thalassemia minor: Secondary | ICD-10-CM

## 2020-02-07 DIAGNOSIS — Z95828 Presence of other vascular implants and grafts: Secondary | ICD-10-CM

## 2020-02-07 DIAGNOSIS — C50411 Malignant neoplasm of upper-outer quadrant of right female breast: Secondary | ICD-10-CM

## 2020-02-07 LAB — CBC WITH DIFFERENTIAL/PLATELET
Abs Immature Granulocytes: 0.01 10*3/uL (ref 0.00–0.07)
Basophils Absolute: 0 10*3/uL (ref 0.0–0.1)
Basophils Relative: 1 %
Eosinophils Absolute: 0 10*3/uL (ref 0.0–0.5)
Eosinophils Relative: 1 %
HCT: 23.4 % — ABNORMAL LOW (ref 36.0–46.0)
Hemoglobin: 8.3 g/dL — ABNORMAL LOW (ref 12.0–15.0)
Immature Granulocytes: 0 %
Lymphocytes Relative: 30 %
Lymphs Abs: 1 10*3/uL (ref 0.7–4.0)
MCH: 27.8 pg (ref 26.0–34.0)
MCHC: 35.5 g/dL (ref 30.0–36.0)
MCV: 78.3 fL — ABNORMAL LOW (ref 80.0–100.0)
Monocytes Absolute: 0.2 10*3/uL (ref 0.1–1.0)
Monocytes Relative: 5 %
Neutro Abs: 2.1 10*3/uL (ref 1.7–7.7)
Neutrophils Relative %: 63 %
Platelets: 151 10*3/uL (ref 150–400)
RBC: 2.99 MIL/uL — ABNORMAL LOW (ref 3.87–5.11)
RDW: 20.9 % — ABNORMAL HIGH (ref 11.5–15.5)
WBC: 3.3 10*3/uL — ABNORMAL LOW (ref 4.0–10.5)
nRBC: 0 % (ref 0.0–0.2)

## 2020-02-07 LAB — COMPREHENSIVE METABOLIC PANEL
ALT: 18 U/L (ref 0–44)
AST: 19 U/L (ref 15–41)
Albumin: 3.7 g/dL (ref 3.5–5.0)
Alkaline Phosphatase: 74 U/L (ref 38–126)
Anion gap: 6 (ref 5–15)
BUN: 6 mg/dL (ref 6–20)
CO2: 24 mmol/L (ref 22–32)
Calcium: 9.4 mg/dL (ref 8.9–10.3)
Chloride: 109 mmol/L (ref 98–111)
Creatinine, Ser: 0.72 mg/dL (ref 0.44–1.00)
GFR calc Af Amer: >60 mL/min (ref >60)
GFR calc non Af Amer: >60 mL/min (ref >60)
Glucose, Bld: 92 mg/dL (ref 70–99)
Potassium: 3.7 mmol/L (ref 3.5–5.1)
Sodium: 139 mmol/L (ref 135–145)
Total Bilirubin: 0.7 mg/dL (ref 0.3–1.2)
Total Protein: 6.7 g/dL (ref 6.5–8.1)

## 2020-02-07 MED ORDER — HEPARIN SOD (PORK) LOCK FLUSH 100 UNIT/ML IV SOLN
500.0000 [IU] | Freq: Once | INTRAVENOUS | Status: AC | PRN
  Administered 2020-02-07: 500 [IU]
  Filled 2020-02-07: qty 5

## 2020-02-07 MED ORDER — FAMOTIDINE IN NACL 20-0.9 MG/50ML-% IV SOLN
20.0000 mg | Freq: Once | INTRAVENOUS | Status: AC
  Administered 2020-02-07: 20 mg via INTRAVENOUS

## 2020-02-07 MED ORDER — SODIUM CHLORIDE 0.9 % IV SOLN
Freq: Once | INTRAVENOUS | Status: AC
  Filled 2020-02-07: qty 250

## 2020-02-07 MED ORDER — LORAZEPAM 2 MG/ML IJ SOLN
INTRAMUSCULAR | Status: AC
  Filled 2020-02-07: qty 1

## 2020-02-07 MED ORDER — DIPHENHYDRAMINE HCL 50 MG/ML IJ SOLN
INTRAMUSCULAR | Status: AC
  Filled 2020-02-07: qty 1

## 2020-02-07 MED ORDER — PALONOSETRON HCL INJECTION 0.25 MG/5ML
0.2500 mg | Freq: Once | INTRAVENOUS | Status: AC
  Administered 2020-02-07: 0.25 mg via INTRAVENOUS

## 2020-02-07 MED ORDER — LORAZEPAM 2 MG/ML IJ SOLN
0.5000 mg | Freq: Once | INTRAMUSCULAR | Status: AC
  Administered 2020-02-07: 0.5 mg via INTRAVENOUS

## 2020-02-07 MED ORDER — DIPHENHYDRAMINE HCL 50 MG/ML IJ SOLN
25.0000 mg | Freq: Once | INTRAMUSCULAR | Status: AC
  Administered 2020-02-07: 25 mg via INTRAVENOUS

## 2020-02-07 MED ORDER — SODIUM CHLORIDE 0.9 % IV SOLN
300.0000 mg | Freq: Once | INTRAVENOUS | Status: AC
  Administered 2020-02-07: 300 mg via INTRAVENOUS
  Filled 2020-02-07: qty 30

## 2020-02-07 MED ORDER — SODIUM CHLORIDE 0.9 % IV SOLN
20.0000 mg | Freq: Once | INTRAVENOUS | Status: AC
  Administered 2020-02-07: 20 mg via INTRAVENOUS
  Filled 2020-02-07: qty 20

## 2020-02-07 MED ORDER — PALONOSETRON HCL INJECTION 0.25 MG/5ML
INTRAVENOUS | Status: AC
  Filled 2020-02-07: qty 5

## 2020-02-07 MED ORDER — SODIUM CHLORIDE 0.9 % IV SOLN
80.0000 mg/m2 | Freq: Once | INTRAVENOUS | Status: AC
  Administered 2020-02-07: 162 mg via INTRAVENOUS
  Filled 2020-02-07: qty 27

## 2020-02-07 MED ORDER — FAMOTIDINE IN NACL 20-0.9 MG/50ML-% IV SOLN
INTRAVENOUS | Status: AC
  Filled 2020-02-07: qty 50

## 2020-02-07 MED ORDER — SODIUM CHLORIDE 0.9% FLUSH
10.0000 mL | INTRAVENOUS | Status: DC | PRN
  Administered 2020-02-07: 10 mL
  Filled 2020-02-07: qty 10

## 2020-02-07 NOTE — Patient Instructions (Signed)
[facility] Discharge Instructions for Patients Receiving Chemotherapy  Today you received the following chemotherapy agents Paclitaxel (TAXOL) & Carboplatin (PARAPLATIN).  To help prevent nausea and vomiting after your treatment, we encourage you to take your nausea medication as prescribed.  If you develop nausea and vomiting that is not controlled by your nausea medication, call the clinic.   BELOW ARE SYMPTOMS THAT SHOULD BE REPORTED IMMEDIATELY:  *FEVER GREATER THAN 100.5 F  *CHILLS WITH OR WITHOUT FEVER  NAUSEA AND VOMITING THAT IS NOT CONTROLLED WITH YOUR NAUSEA MEDICATION  *UNUSUAL SHORTNESS OF BREATH  *UNUSUAL BRUISING OR BLEEDING  TENDERNESS IN MOUTH AND THROAT WITH OR WITHOUT PRESENCE OF ULCERS  *URINARY PROBLEMS  *BOWEL PROBLEMS  UNUSUAL RASH Items with * indicate a potential emergency and should be followed up as soon as possible.  Feel free to call the clinic should you have any questions or concerns. The clinic phone number is [phone].  Please show the CHEMO ALERT CARD at check-in to the Emergency Department and triage nurse.   

## 2020-02-13 NOTE — Progress Notes (Signed)
[facility] OFFICE PROGRESS NOTE  Patient, No Pcp Per No address on file   ID: Krista [name] DOB: 1976-01-13  MR#: [id number]  POE#:[id number]  Patient Care Team: Patient, No Pcp Per as PCP - General (General Practice) Magrinat, [name], MD as Consulting Physician (Oncology) Erroll Luna, MD as Consulting Physician (General Surgery) Eppie Gibson, MD as Attending Physician (Radiation Oncology) Rockwell Germany, RN as Oncology Nurse Navigator Mauro Kaufmann, RN as Oncology Nurse Navigator Chauncey Cruel, MD OTHER MD:  DIAGNOSIS: Triple negative breast cancer  CURRENT THERAPY: Neoadjuvant chemotherapy  INTERVAL HISTORY: Krista [name] 44 y.o. female returns to clinic today for follow-up visit. The patient states that in the interval since her last treatment, that she has decided that she no longer wanted to continue with chemotherapy. She is requesting that today is her last treatment which would mean that she would have completed 6 of the planned 12 weekly treatments of carboplatin and paclitaxel. She states that she understands the risks of recurrence. She stated that she does not like the way chemotherapy makes her feel. She mentions that treatment causes an unsettled feeling in her stomach. She has been taking OTC reflux medications for this. She denies nausea/vomiting but often has an aversion to food in the AM. She denies unusual diarrhea or constipation. She also notes that she has had frequent blood tinged nasal drainage continuously this past week when she blows her nose. No significant thrombocytopenia on labs. She denies overt/significant epistaxis but the nasal drainage is concerning to her, particularly the post nasal drainage. Denies other symptoms of bleeding.   Otherwise, the patient denies any recent fever, chills, or night sweats. She lost about 3-4 lbs since her last appointment which she attributes to not having an appetite in  the AM. She reports generalized bone aches. She denies any significant peripheral neuropathy. Denies shortness of breath or cough. She reports she feels her heart race at nighttime. The patient is here today for evaluation before starting week 6 out of the planned 12 doses of carboplatin and paclitaxel.  She started weekly carboplatin and paclitaxel on 01/10/2020.  HISTORY OF CURRENT ILLNESS: From the original intake note:  Krista [name] [name]"] herself palpated a lump in the upper-inner right breast sometime late December 2020 or early January 2021.  As the mass grew larger and persisted through menstrual periods she brought it to medical attention and underwent bilateral diagnostic mammography with tomography and bilateral breast ultrasonography at [facility] on 09/26/2019 showing: breast density category C; 5.1 cm mass involving upper-inner quadrant of right breast; no evidence of right axillary lymphadenopathy or left breast malignancy.  Accordingly on 10/03/2019 she proceeded to biopsy of the right breast area in question. The pathology from this procedure ([id number]) showed: invasive ductal carcinoma, grade 3. Prognostic indicators significant for: estrogen receptor, 0% negative and progesterone receptor, 0% negative. Proliferation marker Ki67 at 80%. HER2 negative by immunohistochemistry (1+).   MEDICAL HISTORY: Past Medical History:  Diagnosis Date  . Anxiety   . Cancer ([place])   . Family history of breast cancer   . Family history of cervical cancer   . Family history of lung cancer   . Hemorrhoids     ALLERGIES:  is allergic to [facility] [fosaprepitant].  MEDICATIONS:  Current Outpatient Medications  Medication Sig Dispense Refill  . acetaminophen (TYLENOL) 500 MG tablet Take 1-2 tablets (500-1,000 mg total) by mouth every 6 (six)  hours as needed. (Patient taking differently: Take 500-1,000 mg by mouth every 6 (six) hours as needed for moderate pain or fever. )  60 tablet 0  . desloratadine (CLARINEX) 5 MG tablet Take 5 mg by mouth daily.    Marland Kitchen dexamethasone (DECADRON) 4 MG tablet Take 2 tablets by mouth daily starting the day after Cytoxan x 3 days. Take with food. (Patient taking differently: Take 8 mg by mouth daily. ) 30 tablet 1  . lidocaine-prilocaine (EMLA) cream Apply to affected area once 30 g 3  . loratadine (CLARITIN) 10 MG tablet Take 1 tablet (10 mg total) by mouth daily. 60 tablet 0  . LORazepam (ATIVAN) 0.5 MG tablet Take 1 tablet (0.5 mg total) by mouth at bedtime as needed (Nausea or vomiting). 30 tablet 0  . Multiple Vitamins-Minerals (MULTIVITAMIN WITH MINERALS) tablet Take 2 tablets by mouth daily.    . prochlorperazine (COMPAZINE) 10 MG tablet Take 1 tablet (10 mg total) by mouth every 6 (six) hours as needed (Nausea or vomiting). 30 tablet 1  . valACYclovir (VALTREX) 1000 MG tablet TAKE 1 TABLET BY MOUTH EVERY DAY 90 tablet 1   No current facility-administered medications for this visit.   Facility-Administered Medications Ordered in Other Visits  Medication Dose Route Frequency Provider Last Rate Last Admin  . CARBOplatin (PARAPLATIN) 300 mg in sodium chloride 0.9 % 250 mL chemo infusion  300 mg Intravenous Once Magrinat, [name], MD      . heparin lock flush 100 unit/mL  500 Units Intracatheter Once PRN Magrinat, [name], MD      . PACLitaxel (TAXOL) 162 mg in sodium chloride 0.9 % 250 mL chemo infusion (</= 39m/m2)  80 mg/m2 (Order-Specific) Intravenous Once MChauncey Cruel MD 277 mL/hr at 02/14/20 1257 162 mg at 02/14/20 1257  . sodium chloride flush (NS) 0.9 % injection 10 mL  10 mL Intracatheter PRN Magrinat, GVirgie Dad MD        SURGICAL HISTORY:  Past Surgical History:  Procedure Laterality Date  . IR IMAGING GUIDED PORT INSERTION  52021/05/14  The patient's father died at age 4763from cardiac complications of drug use.  His mother, the patient's paternal grandmother had what seems to have been cancer of the lung  metastatic to the spine.  There was no other cancer on his side of the family to the patient's knowledge.  The patient's mother died at age 539in the setting of multiple medical issues but she had undergone double mastectomy at the age of 441  Her mother, the patient's maternal grandmother had cervical cancer.  The patient's mother had 3 brothers and 3 sisters.  1 of those 3 sisters had breast cancer diagnosed at the age of 543  The patient herself has 2 brothers and 3 sisters one of her sisters was diagnosed with breast cancer at the age of 464and died at the age of 554  GYNECOLOGIC HISTORY:  No LMP recorded. Menarche: 44years old Age [place] first live birth[place] 44[place] regular, last approximately 5 days, of which the second day is heavy Contraceptive: Husband status post vasectomy HRT no Hysterectomy? no BSO?  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  The patient is not a church attender                          ADVANCED DIRECTIVES: In the absence of any documents to the contrary the patient's husband is her healthcare power of attorney  REVIEW OF SYSTEMS:   Review of Systems  Constitutional: Positive for fatigue, appetite change, and weight loss. Negative for chills and fever.  HENT: Positive for blood tinged nasal drainage. Negative for mouth sores,  sore throat and trouble swallowing.   Eyes: Negative for eye problems and icterus.  Respiratory: Negative for cough, hemoptysis, shortness of breath and wheezing.   Cardiovascular: Negative for chest pain and  leg swelling.  Gastrointestinal: Positive for abdominal discomfort. Negative for constipation, diarrhea, nausea and vomiting.  Genitourinary: Negative for bladder incontinence, difficulty urinating, dysuria, frequency and hematuria.   Musculoskeletal: Negative for back pain, gait problem, neck pain and neck stiffness.  Skin: Negative for itching and rash.  Neurological: Negative for dizziness, extremity weakness, gait problem, headaches, light-headedness and seizures.  Hematological: Negative for adenopathy. Psychiatric/Behavioral: Negative for confusion, depression and sleep disturbance. The patient is not nervous/anxious.     PHYSICAL EXAMINATION:  Blood pressure (!) 148/97, pulse 83, temperature 97.8 F (36.6 C), resp. rate 18, height 5' 7"  (1.702 m), weight 206 lb 6.4 oz (93.6 kg), SpO2 100 %.  ECOG PERFORMANCE STATUS: 1 - Symptomatic but completely ambulatory  Physical Exam  Constitutional: Oriented to person, place, and time and well-developed, well-nourished, and in no distress.  HENT:  Head: Normocephalic and atraumatic.  Mouth/Throat: Oropharynx is clear and moist. No oropharyngeal exudate.  Eyes: Conjunctivae are normal. Right eye exhibits no discharge. Left eye exhibits no discharge. No scleral icterus.  Neck: Normal range of motion. Neck supple.  Cardiovascular: Normal rate, regular rhythm, normal heart sounds and intact distal pulses.   Pulmonary/Chest: Effort normal and breath sounds normal. No respiratory distress. No wheezes. No rales.  Abdominal: Soft. Bowel sounds are normal. Exhibits no distension and no mass. There is no tenderness.  Musculoskeletal: Normal range of motion. Exhibits no edema.  Lymphadenopathy:    No cervical adenopathy.  Neurological: Alert and oriented to person, place, and time. Exhibits normal muscle tone. Gait normal. Coordination normal.  Skin: Skin is warm and dry. No rash noted. Not diaphoretic. No erythema. No pallor.  Breast: No palpable  mass in either breast. No skin changes. No nipple discharge.  Psychiatric: Mood, memory and judgment normal.  Vitals reviewed.  LABORATORY DATA: Lab Results  Component Value Date   WBC 2.9 (L) 02/14/2020   HGB 7.9 (L) 02/14/2020   HCT 22.4 (L) 02/14/2020   MCV 80.0 02/14/2020   PLT 103 (L) 02/14/2020      Chemistry      Component Value Date/Time   NA 141 02/14/2020 0945   K 3.9 02/14/2020 0945   CL 110 02/14/2020 0945   CO2 23 02/14/2020 0945   BUN 8 02/14/2020 0945   CREATININE 0.73 02/14/2020 0945   CREATININE 0.95 10/17/2019 1529      Component Value Date/Time   CALCIUM 9.4 02/14/2020 0945   ALKPHOS 71 02/14/2020 0945   AST 18 02/14/2020 0945   AST 16 10/17/2019 1529   ALT 17 02/14/2020 0945   ALT 13 10/17/2019 1529  BILITOT 0.8 02/14/2020 0945   BILITOT 1.1 10/17/2019 1529       RADIOGRAPHIC STUDIES:  No results found.   ASSESSMENT/PLAN:  44 y.o. [place] woman status post right breast upper inner quadrant biopsy 10/03/2019 for a clinical mT3 N1, stage IIIC invasive ductal carcinoma, grade 3, triple negative, with an MIB-1 of 80%             (a) biopsy of a right axillary node 2019-11-09 showed carcinoma             (b) CT chest/abd/pelvis 10/31/2019 shows 1.5 cm hypodense lesion in liver, lung nodules <0.3 cm             (c) bone scan 10/31/2019 negative             (d) liver MRI 11/11/2019 confirms a targetoid lesion in the left hepatic lobe suspicious for metastasis                         (i) liver biopsy 2019-11-23 showed no evidence of malignancy (benign).             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*14)              (a) two variants of uncertain significance were noted - one in the [facility] gene called c.1295T>G and one in the POLD1 gene called c.34G>A             (b) no additional  mutations of concern were found in AIP, ALK, APC, ATM, AXIN2,BAP1, BARD1, BLM, BMPR1A, BRCA1, BRCA2, BRIP1, CASR, CDC73, CDH1, CDK4, CDKN1B, CDKN1C, CDKN2A (p14ARF), CDKN2A (p16INK4a), CEBPA, CHEK2, CTNNA1, DICER1, DIS3L2, EGFR (c.2369C>T, p.Thr790Met variant only), EPCAM (Deletion/duplication testing only),FH, FLCN, GATA2, GPC3, GREM1 (Promoter region deletion/duplication testing only), HOXB13 (c.251G>A, p.Gly84Glu), HRAS, KIT, MAX, MEN1, MET, MITF(c.952G>A, p.Glu318Lys variant only), MLH1, MSH2, MSH3, MSH6, MUTYH, NBN, NF1, NF2, NTHL1, PALB2, PDGFRA, PHOX2B, PMS2, POLD1, POLE, POT1, PRKAR1A, PTCH1, PTEN, RAD50, RAD51C, RAD51D, RB1, RECQL4, RET, RNF43, RUNX1, SDHAF2, SDHA(sequence changes only),SDHB, SDHC, SDHD, SMAD4, SMARCA4, SMARCB1, SMARCE1, STK11, SUFU, TERC, TERT, TMEM127, TP53, TSC1, TSC2, VHL, WRN andWT1.  (2) neoadjuvant chemotherapy consisting of cyclophosphamide and doxorubicin in dose dense fashion x4 started 11/16/2019 and completed 12/27/2019, followed by paclitaxel and carboplatin weekly x12 starting 01/10/2020             (a) echo 10/24/2019 shows an ejection fraction in the 60-65% range  (3) definitive surgery to follow  (4) adjuvant radiation  (5) hemoglobin C trait:             (a) ferritin 10/17/2019 was 45 with saturation 25%, hemoglobin 11.8 and MCV 71.5             (b) hemoglobin electrophoresis 2019-11-22 showed: A: 62.6%, A2: 3.7%, C: 33.2%, F:0 5.1%, S: 0%  Plan: [name] is a pleasant 44 year old African American female diagnosed with right sided triple negative breast cancer. She completed her 4 cycles of dose dense doxorubicin and cyclophosphamide. She then was started on weekly carboplatin and paclitaxel. She completed 5 of the planned 12 treatments. She is scheduled for her 6th weekly treatment today.   The patient and I had a lengthy discussion. She expressed to me that she had decided that she wanted today to be her last chemotherapy. She states she  understands  the data which recommends 12 weekly doses. She was agreeable to proceed with today's treatment though . I discussed I will make her a follow up appointment next week with Dr. Jana Hakim or Mendel Ryder for a more detailed discussion about her current condition and the recommended next steps for her care.     Labs were reviewed. Her Hbg is 7.9 today. I offered the patient a blood transfusion today. I discussed it may help her fatigue/low energy. She declined. I will add a future order for a sample to blood bank in case she has worsening anemia and changes her mind about a blood transfusion. She was instructed to keep the blue bracelet on until it can be confirmed if she is eligible for a blood transfusion next week.   Regarding her blood tinged nasal drainage, I encouraged the patient to avoid forcefully blowing her nose. I also recommended that she keep her nose moist with saline spray and consider. She denies overt/heavy bleeding. Discussed if she had significant bleeding, that I would be happy to refer her to ENT to see if anything is amenable to cauterization. She declined a referral at this time.   For her unsettled feeling in her abdomen, discussed that she can try to take her anti-emetic as well as purchase OTC PPIs such as omeprazole to see if that helps with her symptoms.   Follow up next week with Dr. Jana Hakim   Orders Placed This Encounter  Procedures  . Sample to Blood Bank    Standing Status:   Future    Standing Expiration Date:   02/13/2021     Constance Hackenberg L Jahmir Salo, PA-C 02/14/20

## 2020-02-14 ENCOUNTER — Other Ambulatory Visit: Payer: Self-pay

## 2020-02-14 ENCOUNTER — Encounter: Payer: Self-pay | Admitting: *Deleted

## 2020-02-14 ENCOUNTER — Inpatient Hospital Stay: Payer: BC Managed Care – PPO

## 2020-02-14 ENCOUNTER — Other Ambulatory Visit: Payer: BC Managed Care – PPO

## 2020-02-14 ENCOUNTER — Inpatient Hospital Stay (HOSPITAL_BASED_OUTPATIENT_CLINIC_OR_DEPARTMENT_OTHER): Payer: BC Managed Care – PPO | Admitting: Physician Assistant

## 2020-02-14 ENCOUNTER — Ambulatory Visit: Payer: Medicaid Other | Admitting: Nurse Practitioner

## 2020-02-14 VITALS — BP 126/96 | HR 98

## 2020-02-14 VITALS — BP 148/97 | HR 83 | Temp 97.8°F | Resp 18 | Ht 67.0 in | Wt 206.4 lb

## 2020-02-14 DIAGNOSIS — C50211 Malignant neoplasm of upper-inner quadrant of right female breast: Secondary | ICD-10-CM | POA: Diagnosis not present

## 2020-02-14 DIAGNOSIS — T451X5A Adverse effect of antineoplastic and immunosuppressive drugs, initial encounter: Secondary | ICD-10-CM

## 2020-02-14 DIAGNOSIS — D6481 Anemia due to antineoplastic chemotherapy: Secondary | ICD-10-CM | POA: Diagnosis not present

## 2020-02-14 DIAGNOSIS — Z171 Estrogen receptor negative status [ER-]: Secondary | ICD-10-CM

## 2020-02-14 DIAGNOSIS — C50411 Malignant neoplasm of upper-outer quadrant of right female breast: Secondary | ICD-10-CM

## 2020-02-14 DIAGNOSIS — Z5111 Encounter for antineoplastic chemotherapy: Secondary | ICD-10-CM

## 2020-02-14 DIAGNOSIS — D563 Thalassemia minor: Secondary | ICD-10-CM

## 2020-02-14 DIAGNOSIS — Z95828 Presence of other vascular implants and grafts: Secondary | ICD-10-CM

## 2020-02-14 LAB — COMPREHENSIVE METABOLIC PANEL
ALT: 17 U/L (ref 0–44)
AST: 18 U/L (ref 15–41)
Albumin: 3.7 g/dL (ref 3.5–5.0)
Alkaline Phosphatase: 71 U/L (ref 38–126)
Anion gap: 8 (ref 5–15)
BUN: 8 mg/dL (ref 6–20)
CO2: 23 mmol/L (ref 22–32)
Calcium: 9.4 mg/dL (ref 8.9–10.3)
Chloride: 110 mmol/L (ref 98–111)
Creatinine, Ser: 0.73 mg/dL (ref 0.44–1.00)
GFR calc Af Amer: >60 mL/min (ref >60)
GFR calc non Af Amer: >60 mL/min (ref >60)
Glucose, Bld: 98 mg/dL (ref 70–99)
Potassium: 3.9 mmol/L (ref 3.5–5.1)
Sodium: 141 mmol/L (ref 135–145)
Total Bilirubin: 0.8 mg/dL (ref 0.3–1.2)
Total Protein: 6.5 g/dL (ref 6.5–8.1)

## 2020-02-14 LAB — CBC WITH DIFFERENTIAL/PLATELET
Abs Immature Granulocytes: 0.01 10*3/uL (ref 0.00–0.07)
Basophils Absolute: 0 10*3/uL (ref 0.0–0.1)
Basophils Relative: 1 %
Eosinophils Absolute: 0 10*3/uL (ref 0.0–0.5)
Eosinophils Relative: 1 %
HCT: 22.4 % — ABNORMAL LOW (ref 36.0–46.0)
Hemoglobin: 7.9 g/dL — ABNORMAL LOW (ref 12.0–15.0)
Immature Granulocytes: 0 %
Lymphocytes Relative: 23 %
Lymphs Abs: 0.7 10*3/uL (ref 0.7–4.0)
MCH: 28.2 pg (ref 26.0–34.0)
MCHC: 35.3 g/dL (ref 30.0–36.0)
MCV: 80 fL (ref 80.0–100.0)
Monocytes Absolute: 0.1 10*3/uL (ref 0.1–1.0)
Monocytes Relative: 5 %
Neutro Abs: 2 10*3/uL (ref 1.7–7.7)
Neutrophils Relative %: 70 %
Platelets: 103 10*3/uL — ABNORMAL LOW (ref 150–400)
RBC: 2.8 MIL/uL — ABNORMAL LOW (ref 3.87–5.11)
RDW: 20.2 % — ABNORMAL HIGH (ref 11.5–15.5)
WBC: 2.9 10*3/uL — ABNORMAL LOW (ref 4.0–10.5)
nRBC: 0 % (ref 0.0–0.2)

## 2020-02-14 MED ORDER — DIPHENHYDRAMINE HCL 50 MG/ML IJ SOLN
25.0000 mg | Freq: Once | INTRAMUSCULAR | Status: AC
  Administered 2020-02-14: 25 mg via INTRAVENOUS

## 2020-02-14 MED ORDER — SODIUM CHLORIDE 0.9 % IV SOLN
20.0000 mg | Freq: Once | INTRAVENOUS | Status: AC
  Administered 2020-02-14: 20 mg via INTRAVENOUS
  Filled 2020-02-14: qty 20

## 2020-02-14 MED ORDER — FAMOTIDINE IN NACL 20-0.9 MG/50ML-% IV SOLN
20.0000 mg | Freq: Once | INTRAVENOUS | Status: AC
  Administered 2020-02-14: 20 mg via INTRAVENOUS

## 2020-02-14 MED ORDER — LORAZEPAM 2 MG/ML IJ SOLN
0.5000 mg | Freq: Once | INTRAMUSCULAR | Status: AC
  Administered 2020-02-14: 0.5 mg via INTRAVENOUS

## 2020-02-14 MED ORDER — SODIUM CHLORIDE 0.9% FLUSH
10.0000 mL | INTRAVENOUS | Status: DC | PRN
  Administered 2020-02-14: 10 mL
  Filled 2020-02-14: qty 10

## 2020-02-14 MED ORDER — SODIUM CHLORIDE 0.9 % IV SOLN
Freq: Once | INTRAVENOUS | Status: AC
  Filled 2020-02-14: qty 250

## 2020-02-14 MED ORDER — FAMOTIDINE IN NACL 20-0.9 MG/50ML-% IV SOLN
INTRAVENOUS | Status: AC
  Filled 2020-02-14: qty 50

## 2020-02-14 MED ORDER — HEPARIN SOD (PORK) LOCK FLUSH 100 UNIT/ML IV SOLN
500.0000 [IU] | Freq: Once | INTRAVENOUS | Status: AC | PRN
  Administered 2020-02-14: 500 [IU]
  Filled 2020-02-14: qty 5

## 2020-02-14 MED ORDER — LORAZEPAM 2 MG/ML IJ SOLN
INTRAMUSCULAR | Status: AC
  Filled 2020-02-14: qty 1

## 2020-02-14 MED ORDER — DIPHENHYDRAMINE HCL 50 MG/ML IJ SOLN
INTRAMUSCULAR | Status: AC
  Filled 2020-02-14: qty 1

## 2020-02-14 MED ORDER — PALONOSETRON HCL INJECTION 0.25 MG/5ML
INTRAVENOUS | Status: AC
  Filled 2020-02-14: qty 5

## 2020-02-14 MED ORDER — SODIUM CHLORIDE 0.9 % IV SOLN
300.0000 mg | Freq: Once | INTRAVENOUS | Status: AC
  Administered 2020-02-14: 300 mg via INTRAVENOUS
  Filled 2020-02-14: qty 30

## 2020-02-14 MED ORDER — SODIUM CHLORIDE 0.9 % IV SOLN
80.0000 mg/m2 | Freq: Once | INTRAVENOUS | Status: AC
  Administered 2020-02-14: 162 mg via INTRAVENOUS
  Filled 2020-02-14: qty 27

## 2020-02-14 MED ORDER — PALONOSETRON HCL INJECTION 0.25 MG/5ML
0.2500 mg | Freq: Once | INTRAVENOUS | Status: AC
  Administered 2020-02-14: 0.25 mg via INTRAVENOUS

## 2020-02-14 MED ORDER — SODIUM CHLORIDE 0.9 % IV SOLN: 300.0000 mg | Freq: Once | INTRAVENOUS | Status: DC

## 2020-02-14 NOTE — Patient Instructions (Addendum)
[facility] Discharge Instructions for Patients Receiving Chemotherapy  Today you received the following chemotherapy agents: Paclitaxel (Taxol) and Carboplatin.  To help prevent nausea and vomiting after your treatment, we encourage you to take your nausea medication as directed by your MD.   If you develop nausea and vomiting that is not controlled by your nausea medication, call the clinic.   BELOW ARE SYMPTOMS THAT SHOULD BE REPORTED IMMEDIATELY:  *FEVER GREATER THAN 100.5 F  *CHILLS WITH OR WITHOUT FEVER  NAUSEA AND VOMITING THAT IS NOT CONTROLLED WITH YOUR NAUSEA MEDICATION  *UNUSUAL SHORTNESS OF BREATH  *UNUSUAL BRUISING OR BLEEDING  TENDERNESS IN MOUTH AND THROAT WITH OR WITHOUT PRESENCE OF ULCERS  *URINARY PROBLEMS  *BOWEL PROBLEMS  UNUSUAL RASH Items with * indicate a potential emergency and should be followed up as soon as possible.  Feel free to call the clinic should you have any questions or concerns. The clinic phone number is [phone].  Please show the CHEMO ALERT CARD at check-in to the Emergency Department and triage nurse.    

## 2020-02-14 NOTE — Progress Notes (Signed)
Ok to treat today per [employer], PA

## 2020-02-14 NOTE — Progress Notes (Signed)
Per Cassie Heillingoetter PA, ok for treatment today with Hemoglobin 7.9 Per pt. she refuses blood transfusion at this time.

## 2020-02-15 ENCOUNTER — Telehealth: Payer: Self-pay | Admitting: Physician Assistant

## 2020-02-15 ENCOUNTER — Telehealth: Payer: Self-pay | Admitting: Adult Health

## 2020-02-15 NOTE — Telephone Encounter (Signed)
Spoke with Wilber Bihari. Did not schedule any appts per los. Mendel Ryder will send me a msg on what further action to take for patients schedule

## 2020-02-15 NOTE — Telephone Encounter (Signed)
Scheduled appt for 8/23 per LC. Called and spoke with patient. Confirmed appt

## 2020-02-19 NOTE — Progress Notes (Signed)
[place]  [phone] [phone]     ID: Krista [name] DOB: 02-01-1976  MR#: [id number]  UKG#:[id number]  Patient Care Team: Patient, No Pcp Per as PCP - General (General Practice) Magrinat, [name], MD as Consulting Physician (Oncology) Erroll Luna, MD as Consulting Physician (General Surgery) Eppie Gibson, MD as Attending Physician (Radiation Oncology) Rockwell Germany, RN as Oncology Nurse Navigator Mauro Kaufmann, RN as Oncology Nurse Navigator Aurea Graff OTHER MD:   CHIEF COMPLAINT: Triple negative breast cancer  CURRENT TREATMENT: Neoadjuvant chemotherapy   INTERVAL HISTORY: Krista [name] was scheduled today for follow-up of her triple negative breast cancer, however she did not show for her appointment.  She started carboplatin and paclitaxel on 01/10/2020.    REVIEW OF SYSTEMS: Krista [name]    HISTORY OF CURRENT ILLNESS: From the original intake note:  Krista [name] [name] herself palpated a lump in the upper-inner right breast sometime late December 2020 or early January 2021.  As the mass grew larger and persisted through menstrual periods she brought it to medical attention and underwent bilateral diagnostic mammography with tomography and bilateral breast ultrasonography at [facility] on 09/26/2019 showing: breast density category C; 5.1 cm mass involving upper-inner quadrant of right breast; no evidence of right axillary lymphadenopathy or left breast malignancy.  Accordingly on 10/03/2019 she proceeded to biopsy of the right breast area in question. The pathology from this procedure ([id number]) showed: invasive ductal carcinoma, grade 3. Prognostic indicators significant for: estrogen receptor, 0% negative and progesterone receptor, 0% negative. Proliferation marker Ki67 at 80%. HER2 negative by immunohistochemistry (1+).  The patient's subsequent history is as detailed  below.   PAST MEDICAL HISTORY: Past Medical History:  Diagnosis Date  . Anxiety   . Cancer ([place])   . Family history of breast cancer   . Family history of cervical cancer   . Family history of lung cancer   . Hemorrhoids     PAST SURGICAL HISTORY: No prior surgeries   FAMILY HISTORY: Family History  Problem Relation Age of Onset  . Breast cancer Mother 8       double mastectomy  . Breast cancer Half-Sister 104  . Cirrhosis Half-Sister   . Heart Problems Father   . Stroke Father   . Breast cancer Maternal Aunt 52  . Cancer Maternal Uncle 36       unknown type  . Ovarian cancer Maternal Grandmother        dx. in her mid-40s  . Lung cancer Paternal Grandmother   . Cancer Other        unknown type; maternal great-uncles/aunts (x3)  . Diabetes Half-Brother   . Cancer Other        unknown type; matenral great-uncles/aunt (x4)  . Cancer Maternal Great-grandfather        unknown type  . Breast cancer Cousin 60       paternal cousin  The patient's father died at age 67 from cardiac complications of drug use.  His mother, the patient's paternal grandmother had what seems to have been cancer of the lung metastatic to the spine.  There was no other cancer on his side of the family to the patient's knowledge.  The patient's mother died at age 51 in the setting of multiple medical issues but she had undergone double mastectomy at the age of 23.  Her mother, the patient's maternal grandmother had cervical cancer.  The patient's mother  had 3 brothers and 3 sisters.  1 of those 3 sisters had breast cancer diagnosed at the age of 47.  The patient herself has 2 brothers and 3 sisters one of her sisters was diagnosed with breast cancer at the age of 63 and died at the age of 55   GYNECOLOGIC HISTORY:  No LMP recorded. Menarche: 44 years old Age at first live birth: 44 years old [place] P 3 LMP regular, last approximately 5 days, of which the second day is heavy Contraceptive: Husband status  post vasectomy HRT no  Hysterectomy? no BSO?  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  Her husband [name] is a Research scientist (medical) and also Librarian, academic at [employer].  Their children are Rowe Pavy, 20, who is Scientist, product/process development at [employer], 18, who will be going to [employer] this coming year and then switch over to [employer], [place] psychology and aiming for an art rehabilitation degree; and Alanna, 37, currently attending [employer].  The patient is not a church attender    ADVANCED DIRECTIVES: In the absence of any documents to the contrary the patient's husband is her healthcare power of attorney   HEALTH MAINTENANCE: Social History   Tobacco Use  . Smoking status: Never Smoker  . Smokeless tobacco: Never Used  Substance Use Topics  . Alcohol use: Not Currently    Comment: Social drinker  . Drug use: Never     Colonoscopy: n/a (age)  PAP: UTD  Bone density: n/a (age)   Allergies  Allergen Reactions  . Emend [Fosaprepitant] Shortness Of Breath    Current Outpatient Medications  Medication Sig Dispense Refill  . acetaminophen (TYLENOL) 500 MG tablet Take 1-2 tablets (500-1,000 mg total) by mouth every 6 (six) hours as needed. (Patient taking differently: Take 500-1,000 mg by mouth every 6 (six) hours as needed for moderate pain or fever. ) 60 tablet 0  . desloratadine (CLARINEX) 5 MG tablet Take 5 mg by mouth daily.    Marland Kitchen dexamethasone (DECADRON) 4 MG tablet Take 2 tablets by mouth daily starting the day after Cytoxan x 3 days. Take with food. (Patient taking differently: Take 8 mg by mouth daily. ) 30 tablet 1  . lidocaine-prilocaine (EMLA) cream Apply to affected area once 30 g 3  . loratadine (CLARITIN) 10 MG tablet Take 1 tablet (10 mg total) by mouth daily. 60 tablet 0  . LORazepam (ATIVAN) 0.5 MG tablet Take 1 tablet (0.5 mg total) by mouth at bedtime as needed (Nausea or vomiting). 30 tablet 0  .  Multiple Vitamins-Minerals (MULTIVITAMIN WITH MINERALS) tablet Take 2 tablets by mouth daily.    . prochlorperazine (COMPAZINE) 10 MG tablet Take 1 tablet (10 mg total) by mouth every 6 (six) hours as needed (Nausea or vomiting). 30 tablet 1  . valACYclovir (VALTREX) 1000 MG tablet TAKE 1 TABLET BY MOUTH EVERY DAY 90 tablet 1   No current facility-administered medications for this visit.    OBJECTIVE: African-American woman   There were no vitals filed for this visit. Wt Readings from Last 3 Encounters:  02/14/20 206 lb 6.4 oz (93.6 kg)  01/31/20 210 lb 11.2 oz (95.6 kg)  01/24/20 (!) 210 lb 8 oz (95.5 kg)   There is no height or weight on file to calculate BMI.    ECOG FS:0 - Asymptomatic   LAB RESULTS:  CMP     Component Value Date/Time  NA 141 02/14/2020 0945   K 3.9 02/14/2020 0945   CL 110 02/14/2020 0945   CO2 23 02/14/2020 0945   GLUCOSE 98 02/14/2020 0945   BUN 8 02/14/2020 0945   CREATININE 0.73 02/14/2020 0945   CREATININE 0.95 10/17/2019 1529   CALCIUM 9.4 02/14/2020 0945   PROT 6.5 02/14/2020 0945   ALBUMIN 3.7 02/14/2020 0945   AST 18 02/14/2020 0945   AST 16 10/17/2019 1529   ALT 17 02/14/2020 0945   ALT 13 10/17/2019 1529   ALKPHOS 71 02/14/2020 0945   BILITOT 0.8 02/14/2020 0945   BILITOT 1.1 10/17/2019 1529   GFRNONAA >60 02/14/2020 0945   GFRNONAA >60 10/17/2019 1529   GFRAA >60 02/14/2020 0945   GFRAA >60 10/17/2019 1529    No results found for: TOTALPROTELP, ALBUMINELP, A1GS, A2GS, BETS, BETA2SER, GAMS, MSPIKE, SPEI  Lab Results  Component Value Date   WBC 2.9 (L) 02/14/2020   NEUTROABS 2.0 02/14/2020   HGB 7.9 (L) 02/14/2020   HCT 22.4 (L) 02/14/2020   MCV 80.0 02/14/2020   PLT 103 (L) 02/14/2020    No results found for: LABCA2  No components found for: BVQXIH038  No results for input(s): INR in the last 168 hours.  No results found for: LABCA2  No results found for: UEK800  No results found for: LKJ179  No results found  for: XTA569  Lab Results  Component Value Date   CA2729 29.8 11/16/2019    No components found for: HGQUANT  No results found for: CEA1 / No results found for: CEA1   No results found for: AFPTUMOR  No results found for: CHROMOGRNA  No results found for: KPAFRELGTCHN, LAMBDASER, KAPLAMBRATIO (kappa/lambda light chains)  No results found for: HGBA, HGBA2QUANT, HGBFQUANT, HGBSQUAN (Hemoglobinopathy evaluation)   No results found for: LDH  Lab Results  Component Value Date   IRON 99 10/17/2019   TIBC 391 10/17/2019   IRONPCTSAT 25 10/17/2019   (Iron and TIBC)  Lab Results  Component Value Date   FERRITIN 45 10/17/2019    Urinalysis No results found for: COLORURINE, APPEARANCEUR, LABSPEC, PHURINE, GLUCOSEU, HGBUR, BILIRUBINUR, KETONESUR, PROTEINUR, UROBILINOGEN, NITRITE, LEUKOCYTESUR   STUDIES: No results found.   ELIGIBLE FOR AVAILABLE RESEARCH PROTOCOL: V9480  ASSESSMENT: 44 y.o. [place] woman status post right breast upper inner quadrant biopsy 10/03/2019 for a clinical mT3 N1, stage IIIC invasive ductal carcinoma, grade 3, triple negative, with an MIB-1 of 80%  (a) biopsy of a right axillary node 2019-11-09 showed carcinoma  (b) CT chest/abd/pelvis 10/31/2019 shows 1.5 cm hypodense lesion in liver, lung nodules <0.3 cm  (c) bone scan 10/31/2019 negative  (d) liver MRI 11/11/2019 confirms a targetoid lesion in the left hepatic lobe suspicious for metastasis   (i) liver biopsy 2019-11-23 showed no evidence of malignancy (benign).  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 (p.Thr276Alafs*14)   (a) two variants of uncertain significance were noted - one in the [name] gene called c.1295T>G and one in the POLD1 gene called c.34G>A  (b) no additional mutations of concern were found in AIP, ALK, APC, ATM, AXIN2,BAP1,   BARD1, BLM, BMPR1A, BRCA1, BRCA2, BRIP1, CASR, CDC73, CDH1, CDK4, CDKN1B, CDKN1C, CDKN2A (p14ARF), CDKN2A (p16INK4a), CEBPA, CHEK2, CTNNA1, DICER1, DIS3L2, EGFR (c.2369C>T, p.Thr790Met variant only), EPCAM (Deletion/duplication testing only), FH, FLCN, GATA2, GPC3, GREM1 (Promoter region deletion/duplication testing only), HOXB13 (c.251G>A, p.Gly84Glu), HRAS, KIT,  MAX, MEN1, MET, MITF (c.952G>A, p.Glu318Lys variant only), MLH1, MSH2, MSH3, MSH6, MUTYH, NBN, NF1, NF2, NTHL1, PALB2, PDGFRA, PHOX2B, PMS2, POLD1, POLE, POT1, PRKAR1A, PTCH1, PTEN, RAD50, RAD51C, RAD51D, RB1, RECQL4, RET, RNF43, RUNX1, SDHAF2, SDHA (sequence changes only), SDHB, SDHC, SDHD, SMAD4, SMARCA4, SMARCB1, SMARCE1, STK11, SUFU, TERC, TERT, TMEM127, TP53, TSC1, TSC2, VHL, WRN and WT1.  (2) neoadjuvant chemotherapy consisting of cyclophosphamide and doxorubicin in dose dense fashion x4 started 11/16/2019 and completed 12/27/2019, followed by paclitaxel and carboplatin weekly x12 starting 01/10/2020  (a) echo 10/24/2019 shows an ejection fraction in the 60-65% range  (3) definitive surgery to follow  (4) adjuvant radiation  (5) hemoglobin C trait:  (a) ferritin 10/17/2019 was 45 with saturation 25%, hemoglobin 11.8 and MCV 71.5  (b) hemoglobin electrophoresis 2019-11-22 showed: A: 62.6%, A2: 3.7%, C: 33.2%, F:0 5.1%, S: 0%   PLAN: Truman Hayward Estherdid not show for her appointment today.  A follow-up letter has been sent.    [name]. Magrinat, MD 02/19/20 12:56 PM Medical Oncology and Hematology [facility] [place],  [address] Tel. [phone]    Fax. [phone]   I, Wilburn Mylar, am acting as scribe for Dr. [name]. Magrinat.  I, Lurline Del MD, have reviewed the above documentation for accuracy and completeness, and I agree with the above.   *Total Encounter Time as defined by the Centers for Medicare and Medicaid Services includes, in addition to the face-to-face time of a  patient visit (documented in the note above) non-face-to-face time: obtaining and reviewing outside history, ordering and reviewing medications, tests or procedures, care coordination (communications with other health care professionals or caregivers) and documentation in the medical record.

## 2020-02-20 ENCOUNTER — Encounter: Payer: Self-pay | Admitting: Oncology

## 2020-02-20 ENCOUNTER — Inpatient Hospital Stay: Payer: BC Managed Care – PPO

## 2020-02-20 ENCOUNTER — Inpatient Hospital Stay (HOSPITAL_BASED_OUTPATIENT_CLINIC_OR_DEPARTMENT_OTHER): Payer: BC Managed Care – PPO | Admitting: Oncology

## 2020-02-20 DIAGNOSIS — C50211 Malignant neoplasm of upper-inner quadrant of right female breast: Secondary | ICD-10-CM

## 2020-02-20 DIAGNOSIS — Z171 Estrogen receptor negative status [ER-]: Secondary | ICD-10-CM

## 2020-02-20 MED FILL — Dexamethasone Sodium Phosphate Inj 100 MG/10ML: INTRAMUSCULAR | Qty: 2 | Status: AC

## 2020-02-21 ENCOUNTER — Telehealth: Payer: Self-pay | Admitting: *Deleted

## 2020-02-21 ENCOUNTER — Other Ambulatory Visit: Payer: BC Managed Care – PPO

## 2020-02-21 ENCOUNTER — Inpatient Hospital Stay: Payer: BC Managed Care – PPO

## 2020-02-21 ENCOUNTER — Other Ambulatory Visit: Payer: Self-pay

## 2020-02-21 DIAGNOSIS — D563 Thalassemia minor: Secondary | ICD-10-CM

## 2020-02-21 DIAGNOSIS — C50211 Malignant neoplasm of upper-inner quadrant of right female breast: Secondary | ICD-10-CM

## 2020-02-21 DIAGNOSIS — C50411 Malignant neoplasm of upper-outer quadrant of right female breast: Secondary | ICD-10-CM

## 2020-02-21 DIAGNOSIS — Z171 Estrogen receptor negative status [ER-]: Secondary | ICD-10-CM

## 2020-02-21 LAB — CBC WITH DIFFERENTIAL/PLATELET
Abs Immature Granulocytes: 0.01 10*3/uL (ref 0.00–0.07)
Basophils Absolute: 0 10*3/uL (ref 0.0–0.1)
Basophils Relative: 1 %
Eosinophils Absolute: 0 10*3/uL (ref 0.0–0.5)
Eosinophils Relative: 1 %
HCT: 19.8 % — ABNORMAL LOW (ref 36.0–46.0)
Hemoglobin: 7 g/dL — ABNORMAL LOW (ref 12.0–15.0)
Immature Granulocytes: 1 %
Lymphocytes Relative: 36 %
Lymphs Abs: 0.6 10*3/uL — ABNORMAL LOW (ref 0.7–4.0)
MCH: 28.9 pg (ref 26.0–34.0)
MCHC: 35.4 g/dL (ref 30.0–36.0)
MCV: 81.8 fL (ref 80.0–100.0)
Monocytes Absolute: 0.1 10*3/uL (ref 0.1–1.0)
Monocytes Relative: 5 %
Neutro Abs: 1 10*3/uL — ABNORMAL LOW (ref 1.7–7.7)
Neutrophils Relative %: 56 %
Platelets: 98 10*3/uL — ABNORMAL LOW (ref 150–400)
RBC: 2.42 MIL/uL — ABNORMAL LOW (ref 3.87–5.11)
RDW: 19.3 % — ABNORMAL HIGH (ref 11.5–15.5)
WBC: 1.8 10*3/uL — ABNORMAL LOW (ref 4.0–10.5)
nRBC: 0 % (ref 0.0–0.2)

## 2020-02-21 LAB — COMPREHENSIVE METABOLIC PANEL
ALT: 16 U/L (ref 0–44)
AST: 18 U/L (ref 15–41)
Albumin: 3.8 g/dL (ref 3.5–5.0)
Alkaline Phosphatase: 70 U/L (ref 38–126)
Anion gap: 8 (ref 5–15)
BUN: 9 mg/dL (ref 6–20)
CO2: 26 mmol/L (ref 22–32)
Calcium: 9.6 mg/dL (ref 8.9–10.3)
Chloride: 107 mmol/L (ref 98–111)
Creatinine, Ser: 0.76 mg/dL (ref 0.44–1.00)
GFR calc Af Amer: >60 mL/min (ref >60)
GFR calc non Af Amer: >60 mL/min (ref >60)
Glucose, Bld: 106 mg/dL — ABNORMAL HIGH (ref 70–99)
Potassium: 3.7 mmol/L (ref 3.5–5.1)
Sodium: 141 mmol/L (ref 135–145)
Total Bilirubin: 0.9 mg/dL (ref 0.3–1.2)
Total Protein: 6.6 g/dL (ref 6.5–8.1)

## 2020-02-21 LAB — SAMPLE TO BLOOD BANK

## 2020-02-21 NOTE — Telephone Encounter (Signed)
This RN called pt per noted labs with heme of 7.0 and ANC of 1.0 - sample was sent to blood bank if transfusion needed.  Per phone discussion- labs reviewed and discussed with patient including possible benefit of blood transfusion.   [name] declined transfusion stating " I would like to work on this myself and let my body recover "  This RN reviewed S&S for issues relating to the low heme and to call if symptoms interfere with ADL's or she feels concerned.  [place] reviewed including S&S for possible infection- and number to call ( main office number ) if symptoms occur.  [name] verbalized understanding and appreciation of discussion.  She states " now I am just waiting for when Dr Jana Hakim tells me I am ready for radiation ".  This note will be routed to MD and navigators per pt not wanting to further chemo in neoadjuvant setting.  Of note pt is scheduled for visit with Dr Jannifer Rodney on 8/31 with treatment associated.

## 2020-02-21 NOTE — Telephone Encounter (Signed)
This RN left a VM for pt requesting a return call ASAP due to need to add lab prior to treatment today.  Pt returned call- and left VM on nurse line stating " I was not planning on being treated today as I discussed at my visit last week "  " If you need labs today let me know- I am out and about "  This RN returned pt's call and had to leave a VM- informing her we would like to recheck her lab due to noted mild anemia and thrombocytopenia- but best to schedule just a lab for later today or tomorrow so she would not have to wait.  This RN's name and return number left for communication purposes.

## 2020-02-22 ENCOUNTER — Other Ambulatory Visit: Payer: Self-pay | Admitting: Oncology

## 2020-02-22 DIAGNOSIS — C50211 Malignant neoplasm of upper-inner quadrant of right female breast: Secondary | ICD-10-CM

## 2020-02-22 DIAGNOSIS — Z171 Estrogen receptor negative status [ER-]: Secondary | ICD-10-CM

## 2020-02-22 NOTE — Progress Notes (Signed)
I called the patient and let her know that I understand she has decided against further neoadjuvant treatment.  I explained that the neck step is surgery and she needs an appointment with Dr.Cornett.  I suggested she come by next week to see me as already scheduled so we can discuss further plans more fully

## 2020-02-24 ENCOUNTER — Encounter: Payer: Self-pay | Admitting: Oncology

## 2020-02-27 MED FILL — Dexamethasone Sodium Phosphate Inj 100 MG/10ML: INTRAMUSCULAR | Qty: 2 | Status: AC

## 2020-02-28 ENCOUNTER — Inpatient Hospital Stay: Payer: BC Managed Care – PPO

## 2020-02-28 ENCOUNTER — Encounter: Payer: Self-pay | Admitting: *Deleted

## 2020-02-28 ENCOUNTER — Inpatient Hospital Stay (HOSPITAL_BASED_OUTPATIENT_CLINIC_OR_DEPARTMENT_OTHER): Payer: BC Managed Care – PPO | Admitting: Oncology

## 2020-02-28 ENCOUNTER — Other Ambulatory Visit: Payer: Self-pay

## 2020-02-28 ENCOUNTER — Other Ambulatory Visit: Payer: Self-pay | Admitting: *Deleted

## 2020-02-28 VITALS — BP 124/83 | HR 100 | Temp 97.8°F | Resp 18 | Ht 67.0 in | Wt 207.5 lb

## 2020-02-28 DIAGNOSIS — C50911 Malignant neoplasm of unspecified site of right female breast: Secondary | ICD-10-CM

## 2020-02-28 DIAGNOSIS — D6481 Anemia due to antineoplastic chemotherapy: Secondary | ICD-10-CM

## 2020-02-28 DIAGNOSIS — Z1501 Genetic susceptibility to malignant neoplasm of breast: Secondary | ICD-10-CM

## 2020-02-28 DIAGNOSIS — T451X5A Adverse effect of antineoplastic and immunosuppressive drugs, initial encounter: Secondary | ICD-10-CM

## 2020-02-28 DIAGNOSIS — D563 Thalassemia minor: Secondary | ICD-10-CM | POA: Diagnosis not present

## 2020-02-28 DIAGNOSIS — Z1509 Genetic susceptibility to other malignant neoplasm: Secondary | ICD-10-CM | POA: Diagnosis not present

## 2020-02-28 DIAGNOSIS — C50211 Malignant neoplasm of upper-inner quadrant of right female breast: Secondary | ICD-10-CM

## 2020-02-28 DIAGNOSIS — Z95828 Presence of other vascular implants and grafts: Secondary | ICD-10-CM

## 2020-02-28 DIAGNOSIS — C50411 Malignant neoplasm of upper-outer quadrant of right female breast: Secondary | ICD-10-CM

## 2020-02-28 DIAGNOSIS — Z171 Estrogen receptor negative status [ER-]: Secondary | ICD-10-CM

## 2020-02-28 LAB — COMPREHENSIVE METABOLIC PANEL
ALT: 27 U/L (ref 0–44)
AST: 27 U/L (ref 15–41)
Albumin: 3.5 g/dL (ref 3.5–5.0)
Alkaline Phosphatase: 71 U/L (ref 38–126)
Anion gap: 6 (ref 5–15)
BUN: 6 mg/dL (ref 6–20)
CO2: 26 mmol/L (ref 22–32)
Calcium: 9.6 mg/dL (ref 8.9–10.3)
Chloride: 108 mmol/L (ref 98–111)
Creatinine, Ser: 0.74 mg/dL (ref 0.44–1.00)
GFR calc Af Amer: >60 mL/min (ref >60)
GFR calc non Af Amer: >60 mL/min (ref >60)
Glucose, Bld: 123 mg/dL — ABNORMAL HIGH (ref 70–99)
Potassium: 3.8 mmol/L (ref 3.5–5.1)
Sodium: 140 mmol/L (ref 135–145)
Total Bilirubin: 0.9 mg/dL (ref 0.3–1.2)
Total Protein: 6.5 g/dL (ref 6.5–8.1)

## 2020-02-28 LAB — CBC WITH DIFFERENTIAL/PLATELET
Abs Immature Granulocytes: 0.01 10*3/uL (ref 0.00–0.07)
Basophils Absolute: 0 10*3/uL (ref 0.0–0.1)
Basophils Relative: 0 %
Eosinophils Absolute: 0 10*3/uL (ref 0.0–0.5)
Eosinophils Relative: 2 %
HCT: 21.1 % — ABNORMAL LOW (ref 36.0–46.0)
Hemoglobin: 7.2 g/dL — ABNORMAL LOW (ref 12.0–15.0)
Immature Granulocytes: 1 %
Lymphocytes Relative: 39 %
Lymphs Abs: 0.8 10*3/uL (ref 0.7–4.0)
MCH: 29.3 pg (ref 26.0–34.0)
MCHC: 34.1 g/dL (ref 30.0–36.0)
MCV: 85.8 fL (ref 80.0–100.0)
Monocytes Absolute: 0.3 10*3/uL (ref 0.1–1.0)
Monocytes Relative: 14 %
Neutro Abs: 0.9 10*3/uL — ABNORMAL LOW (ref 1.7–7.7)
Neutrophils Relative %: 44 %
Platelets: 202 10*3/uL (ref 150–400)
RBC: 2.46 MIL/uL — ABNORMAL LOW (ref 3.87–5.11)
RDW: 20.9 % — ABNORMAL HIGH (ref 11.5–15.5)
WBC: 2 10*3/uL — ABNORMAL LOW (ref 4.0–10.5)
nRBC: 1 % — ABNORMAL HIGH (ref 0.0–0.2)

## 2020-02-28 LAB — SAMPLE TO BLOOD BANK

## 2020-02-28 MED ORDER — FERROUS GLUCONATE 324 (38 FE) MG PO TABS: 324.0000 mg | ORAL_TABLET | Freq: Every day | ORAL | 3 refills | Status: AC

## 2020-02-28 MED ORDER — HEPARIN SOD (PORK) LOCK FLUSH 100 UNIT/ML IV SOLN
500.0000 [IU] | Freq: Once | INTRAVENOUS | Status: AC | PRN
  Administered 2020-02-28: 500 [IU]
  Filled 2020-02-28: qty 5

## 2020-02-28 MED ORDER — SODIUM CHLORIDE 0.9% FLUSH
10.0000 mL | INTRAVENOUS | Status: DC | PRN
  Administered 2020-02-28: 10 mL
  Filled 2020-02-28: qty 10

## 2020-02-28 NOTE — Progress Notes (Signed)
[place]  [phone] [phone]     ID: Krista [name] DOB: September 06, 1975  MR#: [id number]  FBP#:[id number]  Patient Care Team: Patient, No Pcp Per as PCP - General (General Practice) Oluwadara Gorman, [name], MD as Consulting Physician (Oncology) Erroll Luna, MD as Consulting Physician (General Surgery) Eppie Gibson, MD as Attending Physician (Radiation Oncology) Rockwell Germany, RN as Oncology Nurse Navigator Mauro Kaufmann, RN as Oncology Nurse Navigator Chauncey Cruel, MD OTHER MD:   CHIEF COMPLAINT: Triple negative breast cancer  CURRENT TREATMENT: Awaiting definitive surgery   INTERVAL HISTORY: Krista [name] returns today for follow-up of her triple negative breast cancer.  She was being treated with carboplatin and paclitaxel and received 6 of 12 planned cycles, but after the February 14, 2020 cycle she decided to stop.  She did not show for her 02/20/2020 appointment for further discussion.  She seemed to think that she was done with treatment, and I called her on 02/22/2020 explaining that surgery was the next step, she needed to see the surgeon Dr. Brantley Stage, and suggested she come and see me to discuss the overall treatment plan.   REVIEW OF SYSTEMS: Krista [name] did not experience any peripheral neuropathy to cause her stopping her neoadjuvant chemo.  She does felt that her body could not take anymore.  She was having headaches night sweats nausea nosebleeds and absolutely no taste.  Now, a few weeks after treatment, she still feels fatigued and has occasional dizzy spells but she continues to work full-time and is able to do shopping and other chores.  She has occasional nosebleeds which she says are getting milder.  She denies shortness of breath chest pain or pressure.  She has not actually fainted.  A detailed review of systems today was otherwise stable   HISTORY OF CURRENT ILLNESS: From the original intake note:  Krista [name] [name]"] herself palpated a lump in the upper-inner right breast sometime late December 2020 or early January 2021.  As the mass grew larger and persisted through menstrual periods she brought it to medical attention and underwent bilateral diagnostic mammography with tomography and bilateral breast ultrasonography at [facility] on 09/26/2019 showing: breast density category C; 5.1 cm mass involving upper-inner quadrant of right breast; no evidence of right axillary lymphadenopathy or left breast malignancy.  Accordingly on 10/03/2019 she proceeded to biopsy of the right breast area in question. The pathology from this procedure ([id number]) showed: invasive ductal carcinoma, grade 3. Prognostic indicators significant for: estrogen receptor, 0% negative and progesterone receptor, 0% negative. Proliferation marker Ki67 at 80%. HER2 negative by immunohistochemistry (1+).  The patient's subsequent history is as detailed below.   PAST MEDICAL HISTORY: Past Medical History:  Diagnosis Date  . Anxiety   . Cancer ([place])   . Family history of breast cancer   . Family history of cervical cancer   . Family history of lung cancer   . Hemorrhoids     PAST SURGICAL HISTORY: No prior surgeries   FAMILY HISTORY: Family History  Problem Relation Age of Onset  . Breast cancer Mother 23       double mastectomy  . Breast cancer Half-Sister 24  . Cirrhosis Half-Sister   . Heart Problems Father   . Stroke Father   . Breast cancer Maternal Aunt 58  . Cancer Maternal Uncle 49       unknown type  . Ovarian cancer Maternal Grandmother  dx. in her mid-40s  . Lung cancer Paternal Grandmother   . Cancer Other        unknown type; maternal great-uncles/aunts (x3)  . Diabetes Half-Brother   . Cancer Other        unknown type; matenral great-uncles/aunt (x4)  . Cancer Maternal Great-grandfather        unknown type  . Breast cancer Cousin 32       paternal cousin  The  patient's father died at age 87 from cardiac complications of drug use.  His mother, the patient's paternal grandmother had what seems to have been cancer of the lung metastatic to the spine.  There was no other cancer on his side of the family to the patient's knowledge.  The patient's mother died at age 31 in the setting of multiple medical issues but she had undergone double mastectomy at the age of 29.  Her mother, the patient's maternal grandmother had cervical cancer.  The patient's mother had 3 brothers and 3 sisters.  1 of those 3 sisters had breast cancer diagnosed at the age of 82.  The patient herself has 2 brothers and 3 sisters one of her sisters was diagnosed with breast cancer at the age of 49 and died at the age of 65   GYNECOLOGIC HISTORY:  No LMP recorded. Menarche: 44 years old Age at first live birth: 44 years old Adin P 3 LMP regular, last approximately 5 days, of which the second day is heavy Contraceptive: Husband status post vasectomy HRT no  Hysterectomy? no BSO?  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  Her husband [name] is a Research scientist (medical) and also Librarian, academic at [employer].  Their children are Rowe Pavy, 20, who is Scientist, product/process development at [employer], 18, who will be going to [employer] this coming year and then switch over to [employer], [place] and aiming for an art rehabilitation degree; and Alanna, 69, currently attending [employer].  The patient is not a church attender    ADVANCED DIRECTIVES: In the absence of any documents to the contrary the patient's husband is her healthcare power of attorney   HEALTH MAINTENANCE: Social History   Tobacco Use  . Smoking status: Never Smoker  . Smokeless tobacco: Never Used  Substance Use Topics  . Alcohol use: Not Currently    Comment: Social drinker  . Drug use: Never     Colonoscopy: n/a (age)  PAP: UTD  Bone density: n/a  (age)   Allergies  Allergen Reactions  . Emend [Fosaprepitant] Shortness Of Breath    Current Outpatient Medications  Medication Sig Dispense Refill  . acetaminophen (TYLENOL) 500 MG tablet Take 1-2 tablets (500-1,000 mg total) by mouth every 6 (six) hours as needed. (Patient taking differently: Take 500-1,000 mg by mouth every 6 (six) hours as needed for moderate pain or fever. ) 60 tablet 0  . desloratadine (CLARINEX) 5 MG tablet Take 5 mg by mouth daily.    Marland Kitchen dexamethasone (DECADRON) 4 MG tablet Take 2 tablets by mouth daily starting the day after Cytoxan x 3 days. Take with food. (Patient taking differently: Take 8 mg by mouth daily. ) 30 tablet 1  . ferrous gluconate (FERGON) 324 MG tablet Take 1 tablet (324 mg total) by mouth daily with breakfast. 100 tablet 3  . lidocaine-prilocaine (EMLA) cream Apply to affected area once 30 g 3  . loratadine (CLARITIN)  10 MG tablet Take 1 tablet (10 mg total) by mouth daily. 60 tablet 0  . LORazepam (ATIVAN) 0.5 MG tablet Take 1 tablet (0.5 mg total) by mouth at bedtime as needed (Nausea or vomiting). 30 tablet 0  . Multiple Vitamins-Minerals (MULTIVITAMIN WITH MINERALS) tablet Take 2 tablets by mouth daily.    . prochlorperazine (COMPAZINE) 10 MG tablet Take 1 tablet (10 mg total) by mouth every 6 (six) hours as needed (Nausea or vomiting). 30 tablet 1  . valACYclovir (VALTREX) 1000 MG tablet TAKE 1 TABLET BY MOUTH EVERY DAY 90 tablet 1   Current Facility-Administered Medications  Medication Dose Route Frequency Provider Last Rate Last Admin  . sodium chloride flush (NS) 0.9 % injection 10 mL  10 mL Intracatheter PRN Nazim Kadlec, [name], MD   10 mL at 02/28/20 1117    OBJECTIVE: African-American woman in no acute distress  Vitals:   02/28/20 1100  BP: 124/83  Pulse: 100  Resp: 18  Temp: 97.8 F (36.6 C)  SpO2: 100%   Wt Readings from Last 3 Encounters:  02/28/20 207 lb 8 oz (94.1 kg)  02/14/20 206 lb 6.4 oz (93.6 kg)  01/31/20 210 lb  11.2 oz (95.6 kg)   Body mass index is 32.5 kg/m.    ECOG FS:1 - Symptomatic but completely ambulatory  Sclerae unicteric, EOMs intact Wearing a mask No cervical or supraclavicular adenopathy Lungs no rales or rhonchi Heart regular rate and rhythm Abd soft, nontender, positive bowel sounds MSK no focal spinal tenderness, no upper extremity lymphedema Neuro: nonfocal, well oriented, appropriate affect Breasts: I do not palpate a mass in the right breast.  There are no skin or nipple changes of concern.  Left breast is benign.  Both axillae are benign.   LAB RESULTS:  CMP     Component Value Date/Time   NA 140 02/28/2020 1034   K 3.8 02/28/2020 1034   CL 108 02/28/2020 1034   CO2 26 02/28/2020 1034   GLUCOSE 123 (H) 02/28/2020 1034   BUN 6 02/28/2020 1034   CREATININE 0.74 02/28/2020 1034   CREATININE 0.95 10/17/2019 1529   CALCIUM 9.6 02/28/2020 1034   PROT 6.5 02/28/2020 1034   ALBUMIN 3.5 02/28/2020 1034   AST 27 02/28/2020 1034   AST 16 10/17/2019 1529   ALT 27 02/28/2020 1034   ALT 13 10/17/2019 1529   ALKPHOS 71 02/28/2020 1034   BILITOT 0.9 02/28/2020 1034   BILITOT 1.1 10/17/2019 1529   GFRNONAA >60 02/28/2020 1034   GFRNONAA >60 10/17/2019 1529   GFRAA >60 02/28/2020 1034   GFRAA >60 10/17/2019 1529    No results found for: TOTALPROTELP, ALBUMINELP, A1GS, A2GS, BETS, BETA2SER, GAMS, MSPIKE, SPEI  Lab Results  Component Value Date   WBC 2.0 (L) 02/28/2020   NEUTROABS 0.9 (L) 02/28/2020   HGB 7.2 (L) 02/28/2020   HCT 21.1 (L) 02/28/2020   MCV 85.8 02/28/2020   PLT 202 02/28/2020    No results found for: LABCA2  No components found for: [id number]  No results for input(s): INR in the last 168 hours.  No results found for: LABCA2  No results found for: ATF573  No results found for: UKG254  No results found for: YHC623  Lab Results  Component Value Date   CA2729 29.8 11/16/2019    No components found for: HGQUANT  No results found for:  CEA1 / No results found for: CEA1   No results found for: AFPTUMOR  No results found for: [facility]  No results found for: KPAFRELGTCHN, LAMBDASER, KAPLAMBRATIO (kappa/lambda light chains)  No results found for: HGBA, HGBA2QUANT, HGBFQUANT, HGBSQUAN (Hemoglobinopathy evaluation)   No results found for: LDH  Lab Results  Component Value Date   IRON 99 10/17/2019   TIBC 391 10/17/2019   IRONPCTSAT 25 10/17/2019   (Iron and TIBC)  Lab Results  Component Value Date   FERRITIN 45 10/17/2019    Urinalysis No results found for: COLORURINE, APPEARANCEUR, LABSPEC, PHURINE, GLUCOSEU, HGBUR, BILIRUBINUR, KETONESUR, PROTEINUR, UROBILINOGEN, NITRITE, LEUKOCYTESUR   STUDIES: No results found.   ELIGIBLE FOR AVAILABLE RESEARCH PROTOCOL: Q2297  ASSESSMENT: 44 y.o. [place] woman status post right breast upper inner quadrant biopsy 10/03/2019 for a clinical mT3 N1, stage IIIC invasive ductal carcinoma, grade 3, triple negative, with an MIB-1 of 80%  (a) biopsy of a right axillary node 2019-11-09 showed carcinoma  (b) CT chest/abd/pelvis 10/31/2019 shows 1.5 cm hypodense lesion in liver, lung nodules <0.3 cm  (c) bone scan 10/31/2019 negative  (d) liver MRI 11/11/2019 confirms a targetoid lesion in the left hepatic lobe suspicious for metastasis   (i) liver biopsy 2019-11-23 showed no evidence of malignancy (benign).  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251G>A, p.Gly84Glu), HRAS, KIT, MAX, MEN1, MET, MITF (c.952G>A, p.Glu318Lys variant only), MLH1, MSH2, MSH3, MSH6, MUTYH, NBN, NF1, NF2, NTHL1, PALB2, PDGFRA, PHOX2B, PMS2, POLD1, POLE, POT1, PRKAR1A, PTCH1, PTEN, RAD50, RAD51C, RAD51D, RB1, RECQL4, RET, RNF43, RUNX1, SDHAF2, SDHA (sequence changes only), SDHB, SDHC, SDHD, SMAD4, SMARCA4, SMARCB1, SMARCE1, STK11, SUFU, TERC, TERT, TMEM127, TP53, TSC1, TSC2, VHL, WRN and WT1.  (2) neoadjuvant chemotherapy consisting of cyclophosphamide and doxorubicin in dose dense fashion x4 started 11/16/2019 and completed 12/27/2019, followed by paclitaxel and carboplatin weekly x12 starting 01/10/2020  (a) echo 10/24/2019 shows an ejection fraction in the 60-65% range  (b) patient opted to discontinue paclitaxel/carboplatin treatments after 6 doses, last dose 02/14/2020  (3) definitive surgery pending: Patient is hoping to keep her breast  (4) adjuvant radiation to follow as appropriate  (5) hemoglobin C trait:  (a) ferritin 10/17/2019 was 45 with saturation 25%, hemoglobin 11.8 and MCV 71.5  (b) hemoglobin electrophoresis 2019-11-22 showed: A: 62.6%, A2: 3.7%, C: 33.2%, F:0 5.1%, S: 0%   PLAN: Mickel Baas still significantly anemic, but there is some evidence of the marrow responding as we see some nucleated red blood cells.  I think it would be helpful if she took iron and we are starting her on Fergon 1 daily.  We did discuss transfusion but she does not feel she needs that at this point.  She does not have any peripheral neuropathy symptoms which is very favorable and I am hoping she will have no long-term residuals from her chemotherapy.  She does need a repeat MRI and this is being ordered.  I have also alerted Dr. Brantley Stage that she is now ready for surgery.  She is hoping to keep her breast and I think it may be possible after the treatment she has received.  I am not anticipating any further intravenous treatment postoperatively.  If there is residual disease we will consider capecitabine.  Accordingly her port may be removed at the time of surgery.  I am going to see her early October.  She knows to call for any other issue that may develop before then  Total encounter time 30 minutes.Sarajane Jews C. Erby Sanderson, MD 02/28/20 6:50 PM Medical Oncology and Hematology [facility] [place], [place] [address] Tel. [phone]    Fax. [phone]   I, Wilburn Mylar, am acting as scribe for Dr. [name]. Bertha Lokken.  I, Lurline Del MD, have reviewed the above documentation for accuracy and completeness, and I agree with the above.   *Total Encounter Time as defined by the Centers for Medicare and Medicaid Services includes, in addition to the face-to-face time of a patient visit (documented in the note above) non-face-to-face time: obtaining and reviewing outside history, ordering and reviewing medications, tests or procedures, care coordination (communications with other health care professionals or caregivers) and documentation in the medical record.

## 2020-02-29 ENCOUNTER — Telehealth: Payer: Self-pay | Admitting: Oncology

## 2020-02-29 ENCOUNTER — Telehealth: Payer: Self-pay | Admitting: *Deleted

## 2020-02-29 NOTE — Telephone Encounter (Signed)
This RN returned call to pt per her VM wanting to verify plan now that she has officially completed chemo- " do I need the MRI and then see Dr Brantley Stage ?"  This RN informed her above is correct- MRI is currently being processed for approval by her insurance and then she should be getting a call from central scheduling- she would then see Dr Brantley Stage after the MRI for his review of surgery.  Krista [name] verbalized understanding and will await call for MRI.  This note will be forwarded to navigators for review of communication and current plan of care.

## 2020-02-29 NOTE — Telephone Encounter (Signed)
Scheduled appts per 8/31 los. Pt confirmed appt date and time.

## 2020-03-06 ENCOUNTER — Encounter: Payer: Self-pay | Admitting: *Deleted

## 2020-03-07 ENCOUNTER — Other Ambulatory Visit: Payer: Self-pay

## 2020-03-07 ENCOUNTER — Ambulatory Visit
Admission: RE | Admit: 2020-03-07 | Discharge: 2020-03-07 | Disposition: A | Discharge status: Home / Self Care | Payer: Medicaid Other | Source: Ambulatory Visit | Attending: Oncology | Admitting: Oncology

## 2020-03-07 DIAGNOSIS — Z171 Estrogen receptor negative status [ER-]: Secondary | ICD-10-CM

## 2020-03-07 DIAGNOSIS — D563 Thalassemia minor: Secondary | ICD-10-CM

## 2020-03-07 DIAGNOSIS — Z1509 Genetic susceptibility to other malignant neoplasm: Secondary | ICD-10-CM

## 2020-03-07 DIAGNOSIS — C50911 Malignant neoplasm of unspecified site of right female breast: Secondary | ICD-10-CM

## 2020-03-07 DIAGNOSIS — C50211 Malignant neoplasm of upper-inner quadrant of right female breast: Secondary | ICD-10-CM

## 2020-03-07 DIAGNOSIS — Z1501 Genetic susceptibility to malignant neoplasm of breast: Secondary | ICD-10-CM

## 2020-03-07 MED ORDER — GADOBUTROL 1 MMOL/ML IV SOLN
10.0000 mL | Freq: Once | INTRAVENOUS | Status: AC | PRN
  Administered 2020-03-07: 10 mL via INTRAVENOUS

## 2020-03-08 ENCOUNTER — Encounter: Payer: Self-pay | Admitting: *Deleted

## 2020-03-12 ENCOUNTER — Ambulatory Visit: Payer: Self-pay | Admitting: Surgery

## 2020-03-12 DIAGNOSIS — C50911 Malignant neoplasm of unspecified site of right female breast: Secondary | ICD-10-CM

## 2020-03-12 NOTE — H&P (Signed)
Krista [name] Appointment: 03/12/2020 9:20 AM Location: [facility] Patient #: [id number] DOB: 05-26-1976 Married / Language: English / Race: Black or African American Female  History of Present Illness [name] A. Deborh Pense MD; 03/12/2020 12:56 PM) Patient words: Patient returns for follow-up of her right breast cancer after neoadjuvant chemotherapy. She is doing well. Chemotherapy early. Magnetic resonance imaging showed a very good response with significant reduction in size over positive lymph node in right breast mass. With a long discussion about treatment options. Her genetics reviewed well and her BRCA status unknown significance was discussed as well. She really wants to proceed with breast conserving surgery after discussion of the pros and cons of this approach and wanted to proceed with lumpectomy to try to conserve her breast. She had a significantly large tumor looks like her responses been quite good and certainly this is an option for think at this point. I discussed the brachial relationship as well and the benefits of bilateral mastectomy in his circumstances even though her medication and is significance are not totally clear. Given the fact that BRCA2 and BRCA1 mutation certainly can increase lifetime risk we had a discussion about all the above given her unique circumstances.           Patient diagnosed with invasive carcinoma of the right breast on an ultrasound-guided core needle biopsy from 10/03/2019. She underwent post biopsy breast MRI on 10/27/2019. An abnormal far lateral right axillary lymph node was noted, which was reassessed with ultrasound on 11/09/2019 and biopsied revealing metastatic lymphadenopathy. The patient has undergone neoadjuvant chemotherapy. Current exam is to assess response to treatment.  LABS: No labs drawn at time of imaging.  EXAM: BILATERAL BREAST MRI WITH AND WITHOUT CONTRAST  TECHNIQUE: Multiplanar,  multisequence MR images of both breasts were obtained prior to and following the intravenous administration of 10 ml of Gadavist  Three-dimensional MR images were rendered by post-processing of the original MR data on an independent workstation. The three-dimensional MR images were interpreted, and findings are reported in the following complete MRI report for this study. Three dimensional images were evaluated at the independent interpreting workstation using the DynaCAD thin client.  COMPARISON: Prior exams including the previous breast MRI dated 10/27/2019.  FINDINGS: Breast composition: b. Scattered fibroglandular tissue.  Background parenchymal enhancement: Minimal  Right breast: There is subtle non mass enhancement in the upper inner right breast, spanning approximately 5.4 x 1.6 x 3.1 cm. There is no longer a defined enhancing mass. Susceptibility artifact lies within this area of enhancement reflecting the post biopsy marker clip. There are no other areas of abnormal right breast enhancement.  Left breast: No suspicious mass or abnormal enhancement. 2 small adjacent cysts associated with glandular tissue in the upper outer left breast corresponds to the diagnostic ultrasound of the breast dated 09/26/2019.  Lymph nodes: Previously seen and biopsied enlarged right axillary lymph node has significantly decreased in size, now measuring 7 mm in short axis, previously 1.5 cm. There are now no enlarged lymph nodes.  Ancillary findings: None.  IMPRESSION: 1. Marked positive imaging response to neoadjuvant chemotherapy. The right breast mass has significantly decreased with only residual subtle none mass enhancement persisting. The metastatic right axillary lymph node has significantly decreased in size. 2. No evidence of new right breast malignancy. No evidence of left breast malignancy.  RECOMMENDATION: 1. Treatment as planned for the known right breast malignancy  and right metastatic axillary lymph node.  BI-RADS CATEGORY 6: Known biopsy-proven malignancy.  Electronically Signed By: Lajean Manes M.D. On: 03/07/2020 12:53 44 y.o. [place] woman status post right breast upper inner quadrant biopsy 10/03/2019 for a clinical mT3 N1, stage IIIC invasive ductal carcinoma, grade 3, triple negative, with an MIB-1 of 80% (a) biopsy of a right axillary node 2019-11-09 showed carcinoma (b) CT chest/abd/pelvis 10/31/2019 shows 1.5 cm hypodense lesion in liver, lung nodules <0.3 cm (c) bone scan 10/31/2019 negative (d) liver MRI 11/11/2019 confirms a targetoid lesion in the left hepatic lobe suspicious for metastasis (i) liver biopsy 2019-11-23 showed no evidence of malignancy (benign). (e) baseline CA 27-29 on 11/16/2019 normal at 29.8  (1) genetics testing 2019-11-01 through the the [facility] Multi-Cancer panel found a pathogenic variant in the BRCA1 gene called c.815_824dup (p.Thr276Alafs*14) (a) two variants of uncertain significance were noted - one in the [facility] gene called c.1295T>G and one in the POLD1 gene called c.34G>A (b) no additional mutations of concern were found in AIP, ALK, APC, ATM, AXIN2,BAP1, BARD1, BLM, BMPR1A, BRCA1, BRCA2, BRIP1, CASR, CDC73, CDH1, CDK4, CDKN1B, CDKN1C, CDKN2A (p14ARF), CDKN2A (p16INK4a), CEBPA, CHEK2, CTNNA1, DICER1, DIS3L2, EGFR (c.2369C>T, p.Thr790Met variant only), EPCAM (Deletion/duplication testing only), FH, FLCN, GATA2, GPC3, GREM1 (Promoter region deletion/duplication testing only), HOXB13 (c.251G>A, p.Gly84Glu), HRAS, KIT, MAX, MEN1, MET, MITF (c.952G>A, p.Glu318Lys variant only), MLH1, MSH2, MSH3, MSH6, MUTYH, NBN, NF1, NF2, NTHL1, PALB2, PDGFRA, PHOX2B, PMS2, POLD1, POLE, POT1, PRKAR1A, PTCH1, PTEN, RAD50, RAD51C, RAD51D, RB1, RECQL4, RET, RNF43, RUNX1, SDHAF2, SDHA (sequence changes only), SDHB, SDHC, SDHD, SMAD4,  SMARCA4, SMARCB1, SMARCE1, STK11, SUFU, TERC, TERT, TMEM127, TP53, TSC1, TSC2, VHL, WRN and WT1.  (2) neoadjuvant chemotherapy consisting of cyclophosphamide and doxorubicin in dose dense fashion x4 started 11/16/2019 and completed 12/27/2019, followed by paclitaxel and carboplatin weekly x12 starting 01/10/2020 (a) echo 10/24/2019 shows an ejection fraction in the 60-65% range (b) patient opted to discontinue paclitaxel/carboplatin treatments after 6 doses, last dose 02/14/2020  (3) definitive surgery pending: Patient is hoping to keep her breast  (4) adjuvant radiation to follow as appropriate  (5) hemoglobin C trait: (a) ferritin 10/17/2019 was 45 with saturation 25%, hemoglobin 11.8 and MCV 71.5 (b) hemoglobin electrophoresis 2019-11-22 showed: A: 62.6%, A2: 3.7%, C: 33.2%, F:0 5.1%, S: 0%.  The patient is a 44 year old female.   Allergies (Chanel Teressa Senter, CMA; 03/12/2020 9:26 AM) No Known Allergies [10/11/2019]: No Known Drug Allergies [10/11/2019]: Allergies Reconciled  Medication History (Chanel Teressa Senter, [place]; 03/12/2020 9:26 AM) No Current Medications Medications Reconciled    Vitals (Chanel Nolan CMA; 03/12/2020 9:27 AM) 03/12/2020 9:26 AM Weight: 205.5 lb Height: 67in Body Surface Area: 2.05 m Body Mass Index: 32.19 kg/m  Temp.: 97.60F  Pulse: 106 (Regular)         Physical Exam (Timon Geissinger A. Jermichael Belmares MD; 03/12/2020 12:55 PM)  General Note: Alopecia noted  Chest and Lung Exam Chest and lung exam reveals -quiet, even and easy respiratory effort with no use of accessory muscles and on auscultation, normal breath sounds, no adventitious sounds and normal vocal resonance. Inspection Chest Wall - Normal. Back - normal.  Breast Note: Left breast is normal. Right breast is normal today. No residual tumor palpated. Left axilla normal. Right axilla normal. No supraclavicular adenopathy noted  bilaterally.  Neurologic Neurologic evaluation reveals -alert and oriented x 3 with no impairment of recent or remote memory. Mental Status-Normal.  Lymphatic Head & Neck  General Head & Neck Lymphatics: Bilateral - Description - Normal. Axillary  General Axillary Region: Bilateral - Description - Normal. Tenderness - Non Tender.  Assessment & Plan (Shalin Linders A. Alaine Loughney MD; 03/12/2020 12:57 PM)  RIGHT BREAST CANCER WITH T3 TUMOR, >5 CM IN GREATEST DIMENSION (C50.911) Impression: Patient has had a very good response to chemotherapy despite stopping early. She is recovering nicely and the side effects are becoming less noticeable. We had a very long discussion about her situation given her age, BRCA situation and initial tumor size. She has no interest in bilateral mastectomy with or without reconstruction at this point after lengthy discussion. She would like to proceed with right breast seed localized lumpectomy with targeted right axillary lymph node biopsy and right axillary sentinel lymph node mapping. Her port can be removed as well since she stopped chemotherapy. Risk of lumpectomy include bleeding, infection, seroma, more surgery, use of seed/wire, wound care, cosmetic deformity and the need for other treatments, death , blood clots, death. Pt agrees to proceed. Risk of sentinel lymph node mapping include bleeding, infection, lymphedema, shoulder pain. stiffness, dye allergy. cosmetic deformity , blood clots, death, need for more surgery. Pt agrees to proceed.  Current Plans Pt Education - CCS Free Text Education/Instructions: discussed with patient and provided information. You are being scheduled for surgery- Our schedulers will call you.  You should hear from our office's scheduling department within 5 working days about the location, date, and time of surgery. We try to make accommodations for patient's preferences in scheduling surgery, but sometimes the OR schedule or the  surgeon's schedule prevents [place] from making those accommodations.  If you have not heard from our office [phone]) in 5 working days, call the office and ask for your surgeon's nurse.  If you have other questions about your diagnosis, plan, or surgery, call the office and ask for your surgeon's nurse.  We discussed the staging and pathophysiology of breast cancer. We discussed all of the different options for treatment for breast cancer including surgery, chemotherapy, radiation therapy, Herceptin, and antiestrogen therapy. We discussed a sentinel lymph node biopsy as she does not appear to having lymph node involvement right now. We discussed the performance of that with injection of radioactive tracer and blue dye. We discussed that she would have an incision underneath her axillary hairline. We discussed that there is a bout a 10-20% chance of having a positive node with a sentinel lymph node biopsy and we will await the permanent pathology to make any other first further decisions in terms of her treatment. One of these options might be to return to the operating room to perform an axillary lymph node dissection. We discussed about a 1-2% risk lifetime of chronic shoulder pain as well as lymphedema associated with a sentinel lymph node biopsy. We discussed the options for treatment of the breast cancer which included lumpectomy versus a mastectomy. We discussed the performance of the lumpectomy with a wire placement. We discussed a 10-20% chance of a positive margin requiring reexcision in the operating room. We also discussed that she may need radiation therapy or antiestrogen therapy or both if she undergoes lumpectomy. We discussed the mastectomy and the postoperative care for that as well. We discussed that there is no difference in her survival whether she undergoes lumpectomy with radiation therapy or antiestrogen therapy versus a mastectomy. There is a slight difference in the local  recurrence rate being 3-5% with lumpectomy and about 1% with a mastectomy. We discussed the risks of operation including bleeding, infection, possible reoperation. She understands her further therapy will be based on what her stages at the time of her operation.  Pt Education - flb breast cancer surgery: discussed with patient and provided information.

## 2020-03-12 NOTE — H&P (View-Only) (Signed)
Krista [name] Appointment: 03/12/2020 9:20 AM Location: [facility] Patient #: [id number] DOB: 01-14-1976 Married / Language: English / Race: Black or African American Female  History of Present Illness Krista Moores A. Janele Lague MD; 03/12/2020 12:56 PM) Patient words: Patient returns for follow-up of her right breast cancer after neoadjuvant chemotherapy. She is doing well. Chemotherapy early. Magnetic resonance imaging showed a very good response with significant reduction in size over positive lymph node in right breast mass. With a long discussion about treatment options. Her genetics reviewed well and her BRCA status unknown significance was discussed as well. She really wants to proceed with breast conserving surgery after discussion of the pros and cons of this approach and wanted to proceed with lumpectomy to try to conserve her breast. She had a significantly large tumor looks like her responses been quite good and certainly this is an option for think at this point. I discussed the brachial relationship as well and the benefits of bilateral mastectomy in his circumstances even though her medication and is significance are not totally clear. Given the fact that BRCA2 and BRCA1 mutation certainly can increase lifetime risk we had a discussion about all the above given her unique circumstances.           Patient diagnosed with invasive carcinoma of the right breast on an ultrasound-guided core needle biopsy from 10/03/2019. She underwent post biopsy breast MRI on 10/27/2019. An abnormal far lateral right axillary lymph node was noted, which was reassessed with ultrasound on 11/09/2019 and biopsied revealing metastatic lymphadenopathy. The patient has undergone neoadjuvant chemotherapy. Current exam is to assess response to treatment.  LABS: No labs drawn at time of imaging.  EXAM: BILATERAL BREAST MRI WITH AND WITHOUT CONTRAST  TECHNIQUE: Multiplanar,  multisequence MR images of both breasts were obtained prior to and following the intravenous administration of 10 ml of Gadavist  Three-dimensional MR images were rendered by post-processing of the original MR data on an independent workstation. The three-dimensional MR images were interpreted, and findings are reported in the following complete MRI report for this study. Three dimensional images were evaluated at the independent interpreting workstation using the DynaCAD thin client.  COMPARISON: Prior exams including the previous breast MRI dated 10/27/2019.  FINDINGS: Breast composition: b. Scattered fibroglandular tissue.  Background parenchymal enhancement: Minimal  Right breast: There is subtle non mass enhancement in the upper inner right breast, spanning approximately 5.4 x 1.6 x 3.1 cm. There is no longer a defined enhancing mass. Susceptibility artifact lies within this area of enhancement reflecting the post biopsy marker clip. There are no other areas of abnormal right breast enhancement.  Left breast: No suspicious mass or abnormal enhancement. 2 small adjacent cysts associated with glandular tissue in the upper outer left breast corresponds to the diagnostic ultrasound of the breast dated 09/26/2019.  Lymph nodes: Previously seen and biopsied enlarged right axillary lymph node has significantly decreased in size, now measuring 7 mm in short axis, previously 1.5 cm. There are now no enlarged lymph nodes.  Ancillary findings: None.  IMPRESSION: 1. Marked positive imaging response to neoadjuvant chemotherapy. The right breast mass has significantly decreased with only residual subtle none mass enhancement persisting. The metastatic right axillary lymph node has significantly decreased in size. 2. No evidence of new right breast malignancy. No evidence of left breast malignancy.  RECOMMENDATION: 1. Treatment as planned for the known right breast malignancy  and right metastatic axillary lymph node.  BI-RADS CATEGORY 6: Known biopsy-proven malignancy.  Electronically Signed By: Lajean Manes M.D. On: 03/07/2020 12:53 44 y.o. [place] woman status post right breast upper inner quadrant biopsy 10/03/2019 for a clinical mT3 N1, stage IIIC invasive ductal carcinoma, grade 3, triple negative, with an MIB-1 of 80% (a) biopsy of a right axillary node 2019-11-09 showed carcinoma (b) CT chest/abd/pelvis 10/31/2019 shows 1.5 cm hypodense lesion in liver, lung nodules <0.3 cm (c) bone scan 10/31/2019 negative (d) liver MRI 11/11/2019 confirms a targetoid lesion in the left hepatic lobe suspicious for metastasis (i) liver biopsy 2019-11-23 showed no evidence of malignancy (benign). (e) baseline CA 27-29 on 11/16/2019 normal at 29.8  (1) genetics testing 2019-11-01 through the the [facility] Multi-Cancer panel found a pathogenic variant in the BRCA1 gene called c.815_824dup (p.Thr276Alafs*14) (a) two variants of uncertain significance were noted - one in the [facility] gene called c.1295T>G and one in the POLD1 gene called c.34G>A (b) no additional mutations of concern were found in AIP, ALK, APC, ATM, AXIN2,BAP1, BARD1, BLM, BMPR1A, BRCA1, BRCA2, BRIP1, CASR, CDC73, CDH1, CDK4, CDKN1B, CDKN1C, CDKN2A (p14ARF), CDKN2A (p16INK4a), CEBPA, CHEK2, CTNNA1, DICER1, DIS3L2, EGFR (c.2369C>T, p.Thr790Met variant only), EPCAM (Deletion/duplication testing only), FH, FLCN, GATA2, GPC3, GREM1 (Promoter region deletion/duplication testing only), HOXB13 (c.251G>A, p.Gly84Glu), HRAS, KIT, MAX, MEN1, MET, MITF (c.952G>A, p.Glu318Lys variant only), MLH1, MSH2, MSH3, MSH6, MUTYH, NBN, NF1, NF2, NTHL1, PALB2, PDGFRA, PHOX2B, PMS2, POLD1, POLE, POT1, PRKAR1A, PTCH1, PTEN, RAD50, RAD51C, RAD51D, RB1, RECQL4, RET, RNF43, RUNX1, SDHAF2, SDHA (sequence changes only), SDHB, SDHC, SDHD, SMAD4,  SMARCA4, SMARCB1, SMARCE1, STK11, SUFU, TERC, TERT, TMEM127, TP53, TSC1, TSC2, VHL, WRN and WT1.  (2) neoadjuvant chemotherapy consisting of cyclophosphamide and doxorubicin in dose dense fashion x4 started 11/16/2019 and completed 12/27/2019, followed by paclitaxel and carboplatin weekly x12 starting 01/10/2020 (a) echo 10/24/2019 shows an ejection fraction in the 60-65% range (b) patient opted to discontinue paclitaxel/carboplatin treatments after 6 doses, last dose 02/14/2020  (3) definitive surgery pending: Patient is hoping to keep her breast  (4) adjuvant radiation to follow as appropriate  (5) hemoglobin C trait: (a) ferritin 10/17/2019 was 45 with saturation 25%, hemoglobin 11.8 and MCV 71.5 (b) hemoglobin electrophoresis 2019-11-22 showed: A: 62.6%, A2: 3.7%, C: 33.2%, F:0 5.1%, S: 0%.  The patient is a 44 year old female.   Allergies (Chanel Teressa Senter, CMA; 03/12/2020 9:26 AM) No Known Allergies [10/11/2019]: No Known Drug Allergies [10/11/2019]: Allergies Reconciled  Medication History (Chanel Teressa Senter, [place]; 03/12/2020 9:26 AM) No Current Medications Medications Reconciled    Vitals (Chanel Nolan CMA; 03/12/2020 9:27 AM) 03/12/2020 9:26 AM Weight: 205.5 lb Height: 67in Body Surface Area: 2.05 m Body Mass Index: 32.19 kg/m  Temp.: 97.8F  Pulse: 106 (Regular)         Physical Exam (Twain Stenseth A. Evan Mackie MD; 03/12/2020 12:55 PM)  General Note: Alopecia noted  Chest and Lung Exam Chest and lung exam reveals -quiet, even and easy respiratory effort with no use of accessory muscles and on auscultation, normal breath sounds, no adventitious sounds and normal vocal resonance. Inspection Chest Wall - Normal. Back - normal.  Breast Note: Left breast is normal. Right breast is normal today. No residual tumor palpated. Left axilla normal. Right axilla normal. No supraclavicular adenopathy noted  bilaterally.  Neurologic Neurologic evaluation reveals -alert and oriented x 3 with no impairment of recent or remote memory. Mental Status-Normal.  Lymphatic Head & Neck  General Head & Neck Lymphatics: Bilateral - Description - Normal. Axillary  General Axillary Region: Bilateral - Description - Normal. Tenderness - Non Tender.  Assessment & Plan (Beecher Furio A. Khyron Garno MD; 03/12/2020 12:57 PM)  RIGHT BREAST CANCER WITH T3 TUMOR, >5 CM IN GREATEST DIMENSION (C50.911) Impression: Patient has had a very good response to chemotherapy despite stopping early. She is recovering nicely and the side effects are becoming less noticeable. We had a very long discussion about her situation given her age, BRCA situation and initial tumor size. She has no interest in bilateral mastectomy with or without reconstruction at this point after lengthy discussion. She would like to proceed with right breast seed localized lumpectomy with targeted right axillary lymph node biopsy and right axillary sentinel lymph node mapping. Her port can be removed as well since she stopped chemotherapy. Risk of lumpectomy include bleeding, infection, seroma, more surgery, use of seed/wire, wound care, cosmetic deformity and the need for other treatments, death , blood clots, death. Pt agrees to proceed. Risk of sentinel lymph node mapping include bleeding, infection, lymphedema, shoulder pain. stiffness, dye allergy. cosmetic deformity , blood clots, death, need for more surgery. Pt agrees to proceed.  Current Plans Pt Education - CCS Free Text Education/Instructions: discussed with patient and provided information. You are being scheduled for surgery- Our schedulers will call you.  You should hear from our office's scheduling department within 5 working days about the location, date, and time of surgery. We try to make accommodations for patient's preferences in scheduling surgery, but sometimes the OR schedule or the  surgeon's schedule prevents [place] from making those accommodations.  If you have not heard from our office [phone]) in 5 working days, call the office and ask for your surgeon's nurse.  If you have other questions about your diagnosis, plan, or surgery, call the office and ask for your surgeon's nurse.  We discussed the staging and pathophysiology of breast cancer. We discussed all of the different options for treatment for breast cancer including surgery, chemotherapy, radiation therapy, Herceptin, and antiestrogen therapy. We discussed a sentinel lymph node biopsy as she does not appear to having lymph node involvement right now. We discussed the performance of that with injection of radioactive tracer and blue dye. We discussed that she would have an incision underneath her axillary hairline. We discussed that there is a bout a 10-20% chance of having a positive node with a sentinel lymph node biopsy and we will await the permanent pathology to make any other first further decisions in terms of her treatment. One of these options might be to return to the operating room to perform an axillary lymph node dissection. We discussed about a 1-2% risk lifetime of chronic shoulder pain as well as lymphedema associated with a sentinel lymph node biopsy. We discussed the options for treatment of the breast cancer which included lumpectomy versus a mastectomy. We discussed the performance of the lumpectomy with a wire placement. We discussed a 10-20% chance of a positive margin requiring reexcision in the operating room. We also discussed that she may need radiation therapy or antiestrogen therapy or both if she undergoes lumpectomy. We discussed the mastectomy and the postoperative care for that as well. We discussed that there is no difference in her survival whether she undergoes lumpectomy with radiation therapy or antiestrogen therapy versus a mastectomy. There is a slight difference in the local  recurrence rate being 3-5% with lumpectomy and about 1% with a mastectomy. We discussed the risks of operation including bleeding, infection, possible reoperation. She understands her further therapy will be based on what her stages at the time of her operation.  Pt Education - flb breast cancer surgery: discussed with patient and provided information.

## 2020-03-13 ENCOUNTER — Other Ambulatory Visit: Payer: Medicaid Other

## 2020-03-15 ENCOUNTER — Encounter: Payer: Self-pay | Admitting: *Deleted

## 2020-03-15 ENCOUNTER — Other Ambulatory Visit: Payer: Self-pay | Admitting: Surgery

## 2020-03-15 DIAGNOSIS — C50911 Malignant neoplasm of unspecified site of right female breast: Secondary | ICD-10-CM

## 2020-03-16 ENCOUNTER — Telehealth: Payer: Self-pay | Admitting: Oncology

## 2020-03-16 NOTE — Telephone Encounter (Signed)
R/s appt per 9/16 sch msg - unable to reach pt . Left message for patient with new appt date and time

## 2020-03-19 ENCOUNTER — Ambulatory Visit: Payer: Self-pay | Admitting: Surgery

## 2020-03-19 DIAGNOSIS — C779 Secondary and unspecified malignant neoplasm of lymph node, unspecified: Secondary | ICD-10-CM

## 2020-03-19 DIAGNOSIS — C50911 Malignant neoplasm of unspecified site of right female breast: Secondary | ICD-10-CM

## 2020-03-20 ENCOUNTER — Other Ambulatory Visit: Payer: Self-pay | Admitting: Surgery

## 2020-03-20 DIAGNOSIS — C779 Secondary and unspecified malignant neoplasm of lymph node, unspecified: Secondary | ICD-10-CM

## 2020-03-20 DIAGNOSIS — C50911 Malignant neoplasm of unspecified site of right female breast: Secondary | ICD-10-CM

## 2020-03-26 NOTE — Pre-Procedure Instructions (Signed)
[name] M [name]  03/26/2020     Your procedure is scheduled on Thursday, October 7.  Report to [facility], Main Entrance or Entrance "A"at 9:00 AM                  .Your surgery or procedure is scheduled to begin at 11:00 A.M.   Call this number if you have problems the morning of surgery: [phone]  This is the number for the Pre- Surgical Desk.For any other questions, please call [phone], Monday - Friday 8 AM - 4 PM.    Remember:  Do not  drink after midnight Wednesday, October 6.  You may drink clear liquids until 8:00 AM .            Clear liquids allowed are:     Water, Juice (non-citric and without pulp - diabetics please choose diet or no sugar options), Carbonated beverages - (diabetics please choose diet or no sugar options), Black Coffee only (no creamer, milk or cream including half and half), Plain Jell-O only (diabetics please choose diet or no sugar options), Gatorade (diabetics please choose diet or no sugar options) and Plain Popsicles only    Take these medicines the morning of surgery with A SIP OF WATER : Tylenol if needed.  1 Week prior to surgery STOP taking Aspirin, Aspirin Products (Goody Powder, Excedrin Migraine), Ibuprofen (Advil), Naproxen (Aleve), Vitamins and Herbal Products (ie Fish Oil).             Special instructions:  Do not shave 48 hours prior to surgery.                       [place]- Preparing For Surgery  Before surgery, you can play an important role. Because skin is not sterile, your skin needs to be as free of germs as possible. You can reduce the number of germs on your skin by washing with CHG (chlorahexidine gluconate) Soap before surgery.  CHG is an antiseptic cleaner which kills germs and bonds with the skin to continue killing germs even after washing.    Oral Hygiene is also important to reduce your risk of infection.  Remember - BRUSH YOUR TEETH THE MORNING OF SURGERY WITH YOUR REGULAR  TOOTHPASTE  Please do not use if you have an allergy to CHG or antibacterial soaps. If your skin becomes reddened/irritated stop using the CHG.  Do not shave (including legs and underarms) for at least 48 hours prior to first CHG shower. It is OK to shave your face.  Please follow these instructions carefully.   1. Shower the NIGHT BEFORE SURGERY and the MORNING OF SURGERY with CHG.   2. If you chose to wash your hair, wash your hair first as usual with your normal shampoo.  3. After you shampoo,wash your face and private area with the soap you use at home, then rinse your hair and body thoroughly to remove the shampoo and soap. .wash  4. Use CHG as you would any other liquid soap. You can apply CHG directly to the skin and wash gently with a scrungie or a clean washcloth.   5. Apply the CHG Soap to your body ONLY FROM THE NECK DOWN.  Do not use on open wounds or open sores. Avoid contact with your eyes, ears, mouth and genitals (private parts).   6. Wash thoroughly, paying special attention to the area where your surgery will be performed.  7.  Thoroughly rinse your body with warm water from the neck down.  8. DO NOT shower/wash with your normal soap after using and rinsing off the CHG Soap.  9. Pat yourself dry with a CLEAN TOWEL.  10. Wear CLEAN PAJAMAS to bed the night before surgery, wear comfortable clothes the morning of surgery  11. Place CLEAN SHEETS on your bed the night of your first shower and DO NOT SLEEP WITH PETS. 12.  Day of Surgery: Shower as instructed above. Do not apply any deodorants/lotions/powders or colognes..  Please wear clean clothes to the hospital/surgery center.   Remember to brush your teeth WITH YOUR REGULAR TOOTHPASTE. Do not wear jewelry, make-up or nail polish.  Do not bring valuables to the hospital.  [facility] is not responsible for any belongings or valuables.  Contacts, dentures or bridgework may not be worn into surgery.  Leave your  suitcase in the car.  After surgery it may be brought to your room.  For patients admitted to the hospital, discharge time will be determined by your treatment team.  Patients discharged the day of surgery will not be allowed to drive home.   Please read over the following fact sheets that you were given.

## 2020-03-27 ENCOUNTER — Encounter (HOSPITAL_COMMUNITY): Payer: Self-pay

## 2020-03-27 ENCOUNTER — Other Ambulatory Visit: Payer: Self-pay

## 2020-03-27 ENCOUNTER — Encounter (HOSPITAL_COMMUNITY)
Admission: RE | Admit: 2020-03-27 | Discharge: 2020-03-27 | Disposition: A | Discharge status: Home / Self Care | Payer: Medicaid Other | Source: Ambulatory Visit | Attending: Surgery | Admitting: Surgery

## 2020-03-27 DIAGNOSIS — C50911 Malignant neoplasm of unspecified site of right female breast: Secondary | ICD-10-CM | POA: Diagnosis not present

## 2020-03-27 DIAGNOSIS — Z9221 Personal history of antineoplastic chemotherapy: Secondary | ICD-10-CM | POA: Diagnosis not present

## 2020-03-27 DIAGNOSIS — C773 Secondary and unspecified malignant neoplasm of axilla and upper limb lymph nodes: Secondary | ICD-10-CM | POA: Insufficient documentation

## 2020-03-27 DIAGNOSIS — E669 Obesity, unspecified: Secondary | ICD-10-CM | POA: Diagnosis not present

## 2020-03-27 DIAGNOSIS — Z01818 Encounter for other preprocedural examination: Secondary | ICD-10-CM | POA: Diagnosis present

## 2020-03-27 DIAGNOSIS — Z79899 Other long term (current) drug therapy: Secondary | ICD-10-CM | POA: Diagnosis not present

## 2020-03-27 HISTORY — DX: Anemia, unspecified: D64.9

## 2020-03-27 LAB — CBC WITH DIFFERENTIAL/PLATELET
Abs Immature Granulocytes: 0.02 10*3/uL (ref 0.00–0.07)
Basophils Absolute: 0.1 10*3/uL (ref 0.0–0.1)
Basophils Relative: 1 %
Eosinophils Absolute: 0.3 10*3/uL (ref 0.0–0.5)
Eosinophils Relative: 6 %
HCT: 34 % — ABNORMAL LOW (ref 36.0–46.0)
Hemoglobin: 11.5 g/dL — ABNORMAL LOW (ref 12.0–15.0)
Immature Granulocytes: 0 %
Lymphocytes Relative: 23 %
Lymphs Abs: 1.2 10*3/uL (ref 0.7–4.0)
MCH: 29 pg (ref 26.0–34.0)
MCHC: 33.8 g/dL (ref 30.0–36.0)
MCV: 85.9 fL (ref 80.0–100.0)
Monocytes Absolute: 0.5 10*3/uL (ref 0.1–1.0)
Monocytes Relative: 9 %
Neutro Abs: 3.1 10*3/uL (ref 1.7–7.7)
Neutrophils Relative %: 61 %
Platelets: 285 10*3/uL (ref 150–400)
RBC: 3.96 MIL/uL (ref 3.87–5.11)
RDW: 15.7 % — ABNORMAL HIGH (ref 11.5–15.5)
WBC: 5.2 10*3/uL (ref 4.0–10.5)
nRBC: 0 % (ref 0.0–0.2)

## 2020-03-27 LAB — COMPREHENSIVE METABOLIC PANEL
ALT: 28 U/L (ref 0–44)
AST: 31 U/L (ref 15–41)
Albumin: 3.8 g/dL (ref 3.5–5.0)
Alkaline Phosphatase: 64 U/L (ref 38–126)
Anion gap: 9 (ref 5–15)
BUN: 6 mg/dL (ref 6–20)
CO2: 27 mmol/L (ref 22–32)
Calcium: 9.7 mg/dL (ref 8.9–10.3)
Chloride: 105 mmol/L (ref 98–111)
Creatinine, Ser: 0.76 mg/dL (ref 0.44–1.00)
GFR calc Af Amer: >60 mL/min (ref >60)
GFR calc non Af Amer: >60 mL/min (ref >60)
Glucose, Bld: 101 mg/dL — ABNORMAL HIGH (ref 70–99)
Potassium: 3.8 mmol/L (ref 3.5–5.1)
Sodium: 141 mmol/L (ref 135–145)
Total Bilirubin: 1.3 mg/dL — ABNORMAL HIGH (ref 0.3–1.2)
Total Protein: 6.8 g/dL (ref 6.5–8.1)

## 2020-03-27 LAB — HCG, QUANTITATIVE, PREGNANCY: hCG, Beta Chain, Quant, S: 1 m[IU]/mL (ref <5)

## 2020-03-27 NOTE — Pre-Procedure Instructions (Signed)
Krista [name] M [name]  03/27/2020     Your procedure is scheduled on Thursday, October 7.  Report to [facility], Main Entrance or Entrance "A"at 9:00 AM                  .Your surgery or procedure is scheduled to begin at 11:00 A.M.   Call this number if you have problems the morning of surgery: [phone]  This is the number for the Pre- Surgical Desk.For any other questions, please call [phone], Monday - Friday 8 AM - 4 PM.    Remember:  Do not  eat after midnight Wednesday, October 6.  You may drink clear liquids until 8:00 AM .            Clear liquids allowed are:     Water, Juice (non-citric and without pulp - diabetics please choose diet or no sugar options), Carbonated beverages - (diabetics please choose diet or no sugar options), Black Coffee only (no creamer, milk or cream including half and half), Plain Jell-O only (diabetics please choose diet or no sugar options), Gatorade (diabetics please choose diet or no sugar options) and Plain Popsicles only    Take these medicines the morning of surgery with A SIP OF WATER : Tylenol if needed.  1 Week prior to surgery STOP taking Aspirin, Aspirin Products (Goody Powder, Excedrin Migraine), Ibuprofen (Advil), Naproxen (Aleve), Vitamins and Herbal Products (ie Fish Oil).             Special instructions:  Do not shave 48 hours prior to surgery.                       [place]- Preparing For Surgery  Before surgery, you can play an important role. Because skin is not sterile, your skin needs to be as free of germs as possible. You can reduce the number of germs on your skin by washing with CHG (chlorahexidine gluconate) Soap before surgery.  CHG is an antiseptic cleaner which kills germs and bonds with the skin to continue killing germs even after washing.    Oral Hygiene is also important to reduce your risk of infection.  Remember - BRUSH YOUR TEETH THE MORNING OF SURGERY WITH YOUR REGULAR TOOTHPASTE  Please  do not use if you have an allergy to CHG or antibacterial soaps. If your skin becomes reddened/irritated stop using the CHG.  Do not shave (including legs and underarms) for at least 48 hours prior to first CHG shower. It is OK to shave your face.  Please follow these instructions carefully.   1. Shower the NIGHT BEFORE SURGERY and the MORNING OF SURGERY with CHG.   2. If you chose to wash your hair, wash your hair first as usual with your normal shampoo.  3. After you shampoo,wash your face and private area with the soap you use at home, then rinse your hair and body thoroughly to remove the shampoo and soap. .wash  4. Use CHG as you would any other liquid soap. You can apply CHG directly to the skin and wash gently with a scrungie or a clean washcloth.   5. Apply the CHG Soap to your body ONLY FROM THE NECK DOWN.  Do not use on open wounds or open sores. Avoid contact with your eyes, ears, mouth and genitals (private parts).   6. Wash thoroughly, paying special attention to the area where your surgery will be performed.  7.  Thoroughly rinse your body with warm water from the neck down.  8. DO NOT shower/wash with your normal soap after using and rinsing off the CHG Soap.  9. Pat yourself dry with a CLEAN TOWEL.  10. Wear CLEAN PAJAMAS to bed the night before surgery, wear comfortable clothes the morning of surgery  11. Place CLEAN SHEETS on your bed the night of your first shower and DO NOT SLEEP WITH PETS. 12.  Day of Surgery: Shower as instructed above. Do not apply any deodorants/lotions/powders or colognes..  Please wear clean clothes to the hospital/surgery center.   Remember to brush your teeth WITH YOUR REGULAR TOOTHPASTE. Do not wear jewelry, make-up or nail polish.  Do not bring valuables to the hospital.  [facility] is not responsible for any belongings or valuables.  Contacts, dentures or bridgework may not be worn into surgery.  Leave your suitcase in the car.   After surgery it may be brought to your room.  For patients admitted to the hospital, discharge time will be determined by your treatment team.  Patients discharged the day of surgery will not be allowed to drive home.   Please read over the following fact sheets that you were given.

## 2020-03-27 NOTE — Progress Notes (Signed)
PCP - Dr. Donneta Romberg Cardiologist - na   Chest x-ray - na EKG - na Stress Test - na ECHO - 10/24/19 Cardiac Cath - na  Sleep Study - na   Blood Thinner Instructions: na Aspirin Instructions: na  ERAS Protcol -yes PRE-SURGERY Ensure not ordered  COVID TEST- 04/02/20   Anesthesia review:   Patient denies shortness of breath, fever, cough and chest pain at PAT appointment   All instructions explained to the patient, with a verbal understanding of the material. Patient agrees to go over the instructions while at home for a better understanding. Patient also instructed to self quarantine after being tested for COVID-19. The opportunity to ask questions was provided.

## 2020-03-28 ENCOUNTER — Encounter (HOSPITAL_COMMUNITY): Payer: Self-pay

## 2020-03-28 NOTE — Anesthesia Preprocedure Evaluation (Addendum)
Anesthesia Evaluation  Patient identified by MRN, date of birth, ID band Patient awake    Reviewed: Allergy & Precautions, H&P , NPO status , Patient's Chart, lab work & pertinent test results  Airway Mallampati: II  TM Distance: >3 FB Neck ROM: Full    Dental no notable dental hx. (+) Chipped, Dental Advisory Given   Pulmonary neg pulmonary ROS,    Pulmonary exam normal breath sounds clear to auscultation       Cardiovascular negative cardio ROS   Rhythm:Regular Rate:Normal     Neuro/Psych negative neurological ROS  negative psych ROS   GI/Hepatic negative GI ROS, Neg liver ROS,   Endo/Other  negative endocrine ROS  Renal/GU negative Renal ROS  negative genitourinary   Musculoskeletal   Abdominal   Peds  Hematology  (+) Blood dyscrasia, anemia ,   Anesthesia Other Findings   Reproductive/Obstetrics negative OB ROS                            Anesthesia Physical Anesthesia Plan  ASA: II  Anesthesia Plan: General   Post-op Pain Management:  Regional for Post-op pain   Induction: Intravenous  PONV Risk Score and Plan: 4 or greater and Ondansetron, Dexamethasone and Midazolam  Airway Management Planned: LMA  Additional Equipment:   Intra-op Plan:   Post-operative Plan: Extubation in OR  Informed Consent: I have reviewed the patients History and Physical, chart, labs and discussed the procedure including the risks, benefits and alternatives for the proposed anesthesia with the patient or authorized representative who has indicated his/her understanding and acceptance.     Dental advisory given  Plan Discussed with: CRNA  Anesthesia Plan Comments: (PAT note written by Myra Gianotti, PA-C. )       Anesthesia Quick Evaluation

## 2020-03-28 NOTE — Progress Notes (Signed)
Anesthesia Chart Review:  Case: [id number] Date/Time: 04/05/20 1045   Procedures:      RIGHT BREAST BRACKETED LUMPECTOMY WITH RADIOACTIVE SEED AND RIGHT AXILLARY  TARGETED LYMPH NODE BIOPSY AND SENTINEL LYMPH NODE MAPPING (Right Breast) - PEC BLOCK     REMOVAL PORT-A-CATH (N/A )   Anesthesia type: General   Pre-op diagnosis: RIGHT BREAST CANCER   Location: [facility] OR ROOM 02 / [facility] OR   Surgeons: Erroll Luna, MD      DISCUSSION: Patient is a 44 year old female scheduled for the above procedure.  History includes never smoker, anemia, right breast cancer (diagnosed 10/03/19; left IJ Port 11/07/19, s/p chemotherapy started 11/16/19, patient opted to discontinue 02/14/20).  BMI is consistent with obesity.  Presurgical COVID-19 test is scheduled for 04/02/2020.  RSL is scheduled for 04/04/2020.  Anesthesia team to evaluate on the day of surgery.   VS: BP (!) 119/93   Pulse (!) 103   Temp 36.5 C (Oral)   Resp 18   Ht 5\' 7"  (1.702 m)   Wt 93.9 kg   LMP 09/04/2019   SpO2 100%   BMI 32.42 kg/m    PROVIDERS: Patient, No Pcp Per. GYN is "Dr. Herbie Baltimore" Magrinat, Sarajane Jews, MD is HEM-ONC   LABS: Labs reviewed: Acceptable for surgery. (all labs ordered are listed, but only abnormal results are displayed)  Labs Reviewed  CBC WITH DIFFERENTIAL/PLATELET - Abnormal; Notable for the following components:      Result Value   Hemoglobin 11.5 (*)    HCT 34.0 (*)    RDW 15.7 (*)    All other components within normal limits  COMPREHENSIVE METABOLIC PANEL - Abnormal; Notable for the following components:   Glucose, Bld 101 (*)    Total Bilirubin 1.3 (*)    All other components within normal limits  HCG, QUANTITATIVE, PREGNANCY  Serum pregnancy test was consistent with a non-pregnant female.   IMAGES: MRI Breast 03/07/20: IMPRESSION: 1. Marked positive imaging response to neoadjuvant chemotherapy. The right breast mass has significantly decreased with only residual subtle none mass enhancement  persisting. The metastatic right axillary lymph node has significantly decreased in size. 2. No evidence of new right breast malignancy. No evidence of left breast malignancy. RECOMMENDATION: 1. Treatment as planned for the known right breast malignancy and right metastatic axillary lymph node.  MRI Liver 11/11/19: IMPRESSION: 1. Targetoid lesion in the medial segment of the LEFT hepatic lobe displays restricted diffusion with target like enhancement with features of metastatic disease. Tissue sampling may be helpful. 2. Subtle focus of diffusion related signal along a venous branch in the lateral segment LEFT hepatic lobe just above the inter lobar fissure (series 8, image 11). No corresponding abnormality on contrasted imaging. This could represent slow flow in a venous branch in plane with the scan or a small focus of disease. Close attention on follow-up is suggested. 3. No other evidence of metastatic disease within the abdomen. 4. 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 - S/p left lobe liver biopsy on 11/23/19: benign liver parenchyma, no malignancy identified., multiple additional levels examined with similar findings.  Bone Scan 10/31/19: IMPRESSION: No scintigraphic evidence of osseous metastatic disease.  CT chest/abd/pelvis 10/31/19: IMPRESSION: 1. Large right breast mass with metastatic right axillary adenopathy. 2. Ill-defined low-attenuation lesion in segment 4 of the liver is worrisome for metastatic disease. Further evaluation with MR abdomen without and with contrast could be performed, as clinically indicated. 3. Lateral left breast nodule. Patient recently underwent sonographic and MRI evaluation  on 09/06/2019 and 10/27/2019, respectively. 4. Enlarged, heterogeneous and fibroid uterus which may cause mild bilateral ureteral dilatation, left greater than right. 5. Tiny subpleural pulmonary nodules are nonspecific and not felt to be metastatic.  Continued attention on follow-up exams is suggested.   EKG: N/A   CV: Echo 10/24/19 (pre-chemo) IMPRESSIONS   1. Left ventricular ejection fraction, by estimation, is 60 to 65%. The  left ventricle has normal function. The left ventricle has no regional  wall motion abnormalities. Left ventricular diastolic parameters are  consistent with Grade I diastolic  dysfunction (impaired relaxation). The average left ventricular global  longitudinal strain is -21.6 %. The global longitudinal strain is normal.  2. Lateral annulus velocity: 16.4 cm/s. Right ventricular systolic  function is normal. The right ventricular size is normal.  3. The mitral valve is normal in structure. Trivial mitral valve  regurgitation. No evidence of mitral stenosis.  4. The aortic valve is normal in structure. Aortic valve regurgitation is  not visualized. No aortic stenosis is present.  5. The inferior vena cava is normal in size with greater than 50%  respiratory variability, suggesting right atrial pressure of 3 mmHg.    Past Medical History:  Diagnosis Date  . Anemia    history of  . Cancer [facility])    right breast cancer 10/03/19, received chemotherapy  . Family history of breast cancer   . Family history of cervical cancer   . Family history of lung cancer   . Hemorrhoids     Past Surgical History:  Procedure Laterality Date  . IR IMAGING GUIDED PORT INSERTION  11/07/2019    MEDICATIONS: . acetaminophen (TYLENOL) 500 MG tablet  . Biotin w/ Vitamins C & E (HAIR/SKIN/NAILS PO)  . Cholecalciferol (VITAMIN D3) 250 MCG (10000 UT) capsule  . dexamethasone (DECADRON) 4 MG tablet  . ferrous gluconate (FERGON) 324 MG tablet  . lidocaine-prilocaine (EMLA) cream  . loratadine (CLARITIN) 10 MG tablet  . LORazepam (ATIVAN) 0.5 MG tablet  . MILK THISTLE PO  . Misc Natural Products [facility]) CAPS  . Misc Natural Products (PHYTOCILLIN) CAPS  . OVER THE COUNTER MEDICATION  . OVER THE COUNTER  MEDICATION  . prochlorperazine (COMPAZINE) 10 MG tablet  . valACYclovir (VALTREX) 1000 MG tablet   No current facility-administered medications for this encounter.   According to current med list, she is no longer taking dexamethasone, ferrous gluconate, EMLA cream, loratadine, lorazepam, prochlorperazine, Valtrex.   Myra Gianotti, PA-C Surgical Short Stay/Anesthesiology [facility] Phone [phone] [facility]  Outpatient Surgery Phone [phone] 03/28/2020 3:16 PM

## 2020-03-31 ENCOUNTER — Other Ambulatory Visit (HOSPITAL_COMMUNITY): Payer: Medicaid Other

## 2020-04-02 ENCOUNTER — Other Ambulatory Visit (HOSPITAL_COMMUNITY)
Admission: RE | Admit: 2020-04-02 | Discharge: 2020-04-02 | Disposition: A | Discharge status: Home / Self Care | Payer: Medicaid Other | Source: Ambulatory Visit | Attending: Surgery | Admitting: Surgery

## 2020-04-02 DIAGNOSIS — Z20822 Contact with and (suspected) exposure to covid-19: Secondary | ICD-10-CM | POA: Diagnosis not present

## 2020-04-02 DIAGNOSIS — Z01812 Encounter for preprocedural laboratory examination: Secondary | ICD-10-CM | POA: Insufficient documentation

## 2020-04-02 LAB — SARS CORONAVIRUS 2 (TAT 6-24 HRS): SARS Coronavirus 2: NEGATIVE

## 2020-04-04 ENCOUNTER — Ambulatory Visit (HOSPITAL_COMMUNITY): Payer: Medicaid Other

## 2020-04-04 ENCOUNTER — Ambulatory Visit
Admission: RE | Admit: 2020-04-04 | Discharge: 2020-04-04 | Disposition: A | Discharge status: Home / Self Care | Payer: Medicaid Other | Source: Ambulatory Visit | Attending: Surgery | Admitting: Surgery

## 2020-04-04 ENCOUNTER — Other Ambulatory Visit: Payer: Self-pay

## 2020-04-04 DIAGNOSIS — C50911 Malignant neoplasm of unspecified site of right female breast: Secondary | ICD-10-CM

## 2020-04-05 ENCOUNTER — Ambulatory Visit (HOSPITAL_COMMUNITY): Payer: Medicaid Other | Admitting: Certified Registered Nurse Anesthetist

## 2020-04-05 ENCOUNTER — Encounter (HOSPITAL_COMMUNITY): Payer: Self-pay | Admitting: Surgery

## 2020-04-05 ENCOUNTER — Ambulatory Visit
Admission: RE | Admit: 2020-04-05 | Discharge: 2020-04-05 | Disposition: A | Discharge status: Home / Self Care | Payer: Medicaid Other | Source: Ambulatory Visit | Attending: Surgery | Admitting: Surgery

## 2020-04-05 ENCOUNTER — Ambulatory Visit (HOSPITAL_COMMUNITY): Payer: Medicaid Other | Admitting: Vascular Surgery

## 2020-04-05 ENCOUNTER — Encounter (HOSPITAL_COMMUNITY)
Admission: RE | Disposition: A | Discharge status: Home / Self Care | Payer: Self-pay | Source: Home / Self Care | Attending: Surgery

## 2020-04-05 ENCOUNTER — Ambulatory Visit (HOSPITAL_BASED_OUTPATIENT_CLINIC_OR_DEPARTMENT_OTHER)
Admission: RE | Admit: 2020-04-05 | Discharge: 2020-04-05 | Disposition: A | Discharge status: Home / Self Care | Payer: Medicaid Other | Attending: Surgery | Admitting: Surgery

## 2020-04-05 ENCOUNTER — Encounter (HOSPITAL_COMMUNITY)
Admission: RE | Admit: 2020-04-05 | Discharge: 2020-04-05 | Disposition: A | Discharge status: Home / Self Care | Payer: Medicaid Other | Source: Ambulatory Visit | Attending: Surgery | Admitting: Surgery

## 2020-04-05 ENCOUNTER — Other Ambulatory Visit: Payer: Self-pay

## 2020-04-05 DIAGNOSIS — C50211 Malignant neoplasm of upper-inner quadrant of right female breast: Secondary | ICD-10-CM | POA: Diagnosis present

## 2020-04-05 DIAGNOSIS — C773 Secondary and unspecified malignant neoplasm of axilla and upper limb lymph nodes: Secondary | ICD-10-CM | POA: Diagnosis not present

## 2020-04-05 DIAGNOSIS — C50911 Malignant neoplasm of unspecified site of right female breast: Secondary | ICD-10-CM

## 2020-04-05 DIAGNOSIS — C779 Secondary and unspecified malignant neoplasm of lymph node, unspecified: Secondary | ICD-10-CM

## 2020-04-05 DIAGNOSIS — Z9221 Personal history of antineoplastic chemotherapy: Secondary | ICD-10-CM | POA: Insufficient documentation

## 2020-04-05 HISTORY — PX: BREAST LUMPECTOMY WITH RADIOACTIVE SEED AND SENTINEL LYMPH NODE BIOPSY: SHX6550

## 2020-04-05 HISTORY — PX: PORT-A-CATH REMOVAL: SHX5289

## 2020-04-05 SURGERY — BREAST LUMPECTOMY WITH RADIOACTIVE SEED AND SENTINEL LYMPH NODE BIOPSY
Anesthesia: General | Site: Chest | Laterality: Right

## 2020-04-05 MED ORDER — CHLORHEXIDINE GLUCONATE 0.12 % MT SOLN
15.0000 mL | OROMUCOSAL | Status: DC
  Filled 2020-04-05 (×2): qty 15

## 2020-04-05 MED ORDER — PHENYLEPHRINE 40 MCG/ML (10ML) SYRINGE FOR IV PUSH (FOR BLOOD PRESSURE SUPPORT)
PREFILLED_SYRINGE | INTRAVENOUS | Status: AC
  Filled 2020-04-05: qty 10

## 2020-04-05 MED ORDER — AMISULPRIDE (ANTIEMETIC) 5 MG/2ML IV SOLN
INTRAVENOUS | Status: AC
  Filled 2020-04-05: qty 2

## 2020-04-05 MED ORDER — CELECOXIB 200 MG PO CAPS: 200.0000 mg | ORAL_CAPSULE | Freq: Once | ORAL | Status: DC

## 2020-04-05 MED ORDER — DEXAMETHASONE SODIUM PHOSPHATE 10 MG/ML IJ SOLN
INTRAMUSCULAR | Status: DC | PRN
  Administered 2020-04-05: 4 mg via INTRAVENOUS

## 2020-04-05 MED ORDER — ONDANSETRON HCL 4 MG/2ML IJ SOLN
INTRAMUSCULAR | Status: AC
  Filled 2020-04-05: qty 2

## 2020-04-05 MED ORDER — PROPOFOL 10 MG/ML IV BOLUS
INTRAVENOUS | Status: AC
  Filled 2020-04-05: qty 20

## 2020-04-05 MED ORDER — FENTANYL CITRATE (PF) 250 MCG/5ML IJ SOLN
INTRAMUSCULAR | Status: AC
  Filled 2020-04-05: qty 5

## 2020-04-05 MED ORDER — GABAPENTIN 300 MG PO CAPS
300.0000 mg | ORAL_CAPSULE | ORAL | Status: AC
  Administered 2020-04-05: 300 mg via ORAL
  Filled 2020-04-05: qty 1

## 2020-04-05 MED ORDER — SODIUM CHLORIDE (PF) 0.9 % IJ SOLN: INTRAVENOUS | Status: DC | PRN

## 2020-04-05 MED ORDER — CEFAZOLIN SODIUM-DEXTROSE 2-4 GM/100ML-% IV SOLN
2.0000 g | INTRAVENOUS | Status: AC
  Administered 2020-04-05: 2 g via INTRAVENOUS
  Filled 2020-04-05: qty 100

## 2020-04-05 MED ORDER — MIDAZOLAM HCL 2 MG/2ML IJ SOLN: 2.0000 mg | Freq: Once | INTRAMUSCULAR | Status: AC

## 2020-04-05 MED ORDER — BUPIVACAINE LIPOSOME 1.3 % IJ SUSP
INTRAMUSCULAR | Status: DC | PRN
  Administered 2020-04-05: 10 mL via PERINEURAL

## 2020-04-05 MED ORDER — CHLORHEXIDINE GLUCONATE CLOTH 2 % EX PADS: 6.0000 | MEDICATED_PAD | Freq: Once | CUTANEOUS | Status: DC

## 2020-04-05 MED ORDER — BUPIVACAINE HCL (PF) 0.25 % IJ SOLN
INTRAMUSCULAR | Status: DC | PRN
  Administered 2020-04-05: 20 mL

## 2020-04-05 MED ORDER — DEXAMETHASONE SODIUM PHOSPHATE 10 MG/ML IJ SOLN
INTRAMUSCULAR | Status: AC
  Filled 2020-04-05: qty 1

## 2020-04-05 MED ORDER — BUPIVACAINE-EPINEPHRINE (PF) 0.5% -1:200000 IJ SOLN
INTRAMUSCULAR | Status: DC | PRN
  Administered 2020-04-05: 20 mL via PERINEURAL

## 2020-04-05 MED ORDER — 0.9 % SODIUM CHLORIDE (POUR BTL) OPTIME
TOPICAL | Status: DC | PRN
  Administered 2020-04-05: 1000 mL

## 2020-04-05 MED ORDER — LACTATED RINGERS IV SOLN: INTRAVENOUS | Status: DC

## 2020-04-05 MED ORDER — ACETAMINOPHEN 500 MG PO TABS
1000.0000 mg | ORAL_TABLET | ORAL | Status: AC
  Administered 2020-04-05: 1000 mg via ORAL
  Filled 2020-04-05: qty 2

## 2020-04-05 MED ORDER — MIDAZOLAM HCL 2 MG/2ML IJ SOLN
INTRAMUSCULAR | Status: AC
  Filled 2020-04-05: qty 2

## 2020-04-05 MED ORDER — AMISULPRIDE (ANTIEMETIC) 5 MG/2ML IV SOLN
5.0000 mg | Freq: Once | INTRAVENOUS | Status: AC
  Administered 2020-04-05: 5 mg via INTRAVENOUS

## 2020-04-05 MED ORDER — FENTANYL CITRATE (PF) 100 MCG/2ML IJ SOLN
INTRAMUSCULAR | Status: DC | PRN
  Administered 2020-04-05: 50 ug via INTRAVENOUS

## 2020-04-05 MED ORDER — FENTANYL CITRATE (PF) 100 MCG/2ML IJ SOLN
INTRAMUSCULAR | Status: AC
  Administered 2020-04-05: 100 ug via INTRAVENOUS
  Filled 2020-04-05: qty 2

## 2020-04-05 MED ORDER — TECHNETIUM TC 99M SULFUR COLLOID FILTERED
1.0000 | Freq: Once | INTRAVENOUS | Status: AC | PRN
  Administered 2020-04-05: 1 via INTRADERMAL

## 2020-04-05 MED ORDER — HEMOSTATIC AGENTS (NO CHARGE) OPTIME
TOPICAL | Status: DC | PRN
  Administered 2020-04-05: 1 via TOPICAL

## 2020-04-05 MED ORDER — PROPOFOL 10 MG/ML IV BOLUS
INTRAVENOUS | Status: DC | PRN
  Administered 2020-04-05: 180 mg via INTRAVENOUS

## 2020-04-05 MED ORDER — OXYCODONE HCL 5 MG PO TABS: 5.0000 mg | ORAL_TABLET | Freq: Four times a day (QID) | ORAL | 0 refills | Status: DC | PRN

## 2020-04-05 MED ORDER — LIDOCAINE HCL (CARDIAC) PF 100 MG/5ML IV SOSY
PREFILLED_SYRINGE | INTRAVENOUS | Status: DC | PRN
  Administered 2020-04-05: 40 mg via INTRAVENOUS

## 2020-04-05 MED ORDER — HYDROMORPHONE HCL 1 MG/ML IJ SOLN
0.2500 mg | INTRAMUSCULAR | Status: DC | PRN
  Administered 2020-04-05: 0.25 mg via INTRAVENOUS

## 2020-04-05 MED ORDER — METHYLENE BLUE 0.5 % INJ SOLN
INTRAVENOUS | Status: AC
  Filled 2020-04-05: qty 10

## 2020-04-05 MED ORDER — HYDROMORPHONE HCL 1 MG/ML IJ SOLN
INTRAMUSCULAR | Status: DC
Start: 2020-04-05 — End: 2020-04-05
  Filled 2020-04-05: qty 1

## 2020-04-05 MED ORDER — ACETAMINOPHEN 500 MG PO TABS: 1000.0000 mg | ORAL_TABLET | Freq: Once | ORAL | Status: AC

## 2020-04-05 MED ORDER — FENTANYL CITRATE (PF) 100 MCG/2ML IJ SOLN: 100.0000 ug | Freq: Once | INTRAMUSCULAR | Status: AC

## 2020-04-05 MED ORDER — IBUPROFEN 800 MG PO TABS: 800.0000 mg | ORAL_TABLET | Freq: Three times a day (TID) | ORAL | 0 refills | Status: DC | PRN

## 2020-04-05 MED ORDER — SODIUM CHLORIDE (PF) 0.9 % IJ SOLN
INTRAMUSCULAR | Status: AC
  Filled 2020-04-05: qty 10

## 2020-04-05 MED ORDER — ONDANSETRON HCL 4 MG/2ML IJ SOLN
INTRAMUSCULAR | Status: DC | PRN
  Administered 2020-04-05: 4 mg via INTRAVENOUS

## 2020-04-05 MED ORDER — BUPIVACAINE HCL (PF) 0.25 % IJ SOLN
INTRAMUSCULAR | Status: AC
  Filled 2020-04-05: qty 30

## 2020-04-05 MED ORDER — MIDAZOLAM HCL 2 MG/2ML IJ SOLN
INTRAMUSCULAR | Status: AC
  Administered 2020-04-05: 2 mg via INTRAVENOUS
  Filled 2020-04-05: qty 2

## 2020-04-05 SURGICAL SUPPLY — 46 items
APPLIER CLIP 9.375 MED OPEN (MISCELLANEOUS) ×3
BINDER BREAST XLRG (GAUZE/BANDAGES/DRESSINGS) ×3 IMPLANT
BINDER BREAST XXLRG (GAUZE/BANDAGES/DRESSINGS) ×3 IMPLANT
CANISTER SUCT 3000ML PPV (MISCELLANEOUS) ×3 IMPLANT
CHLORAPREP W/TINT 10.5 ML (MISCELLANEOUS) ×3 IMPLANT
CHLORAPREP W/TINT 26 (MISCELLANEOUS) ×3 IMPLANT
CLIP APPLIE 9.375 MED OPEN (MISCELLANEOUS) ×2 IMPLANT
CNTNR URN SCR LID CUP LEK RST (MISCELLANEOUS) ×2 IMPLANT
CONT SPEC 4OZ STRL OR WHT (MISCELLANEOUS) ×1
COVER PROBE W GEL 5X96 (DRAPES) ×3 IMPLANT
COVER SURGICAL LIGHT HANDLE (MISCELLANEOUS) ×3 IMPLANT
DECANTER SPIKE VIAL GLASS SM (MISCELLANEOUS) ×3 IMPLANT
DERMABOND ADVANCED (GAUZE/BANDAGES/DRESSINGS) ×2
DERMABOND ADVANCED .7 DNX12 (GAUZE/BANDAGES/DRESSINGS) ×4 IMPLANT
DEVICE DUBIN SPECIMEN MAMMOGRA (MISCELLANEOUS) ×6 IMPLANT
DRAPE CHEST BREAST 15X10 FENES (DRAPES) ×3 IMPLANT
DRSG TEGADERM 2-3/8X2-3/4 SM (GAUZE/BANDAGES/DRESSINGS) ×3 IMPLANT
ELECT CAUTERY BLADE 6.4 (BLADE) ×6 IMPLANT
ELECT REM PT RETURN 9FT ADLT (ELECTROSURGICAL) ×3
ELECTRODE REM PT RTRN 9FT ADLT (ELECTROSURGICAL) ×2 IMPLANT
GLOVE BIO SURGEON STRL SZ8 (GLOVE) ×3 IMPLANT
GLOVE BIOGEL PI IND STRL 8 (GLOVE) ×2 IMPLANT
GLOVE BIOGEL PI INDICATOR 8 (GLOVE) ×1
GOWN STRL REUS W/ TWL LRG LVL3 (GOWN DISPOSABLE) ×4 IMPLANT
GOWN STRL REUS W/ TWL XL LVL3 (GOWN DISPOSABLE) ×2 IMPLANT
GOWN STRL REUS W/TWL LRG LVL3 (GOWN DISPOSABLE) ×2
GOWN STRL REUS W/TWL XL LVL3 (GOWN DISPOSABLE) ×1
HEMOSTAT HEMOBLAST BELLOWS (HEMOSTASIS) ×3 IMPLANT
KIT BASIN OR (CUSTOM PROCEDURE TRAY) ×3 IMPLANT
KIT MARKER MARGIN INK (KITS) ×3 IMPLANT
KIT TURNOVER KIT B (KITS) ×3 IMPLANT
NEEDLE 18GX1X1/2 (RX/OR ONLY) (NEEDLE) ×3 IMPLANT
NEEDLE FILTER BLUNT 18X 1/2SAF (NEEDLE) ×1
NEEDLE FILTER BLUNT 18X1 1/2 (NEEDLE) ×2 IMPLANT
NEEDLE HYPO 25GX1X1/2 BEV (NEEDLE) ×3 IMPLANT
NS IRRIG 1000ML POUR BTL (IV SOLUTION) ×3 IMPLANT
PACK GENERAL/GYN (CUSTOM PROCEDURE TRAY) ×3 IMPLANT
PAD ARMBOARD 7.5X6 YLW CONV (MISCELLANEOUS) ×6 IMPLANT
PENCIL SMOKE EVACUATOR (MISCELLANEOUS) ×3 IMPLANT
SUT MNCRL AB 4-0 PS2 18 (SUTURE) ×9 IMPLANT
SUT VIC AB 3-0 SH 18 (SUTURE) ×6 IMPLANT
SUT VIC AB 3-0 SH 27 (SUTURE) ×1
SUT VIC AB 3-0 SH 27X BRD (SUTURE) ×2 IMPLANT
SYR CONTROL 10ML LL (SYRINGE) ×3 IMPLANT
TOWEL GREEN STERILE (TOWEL DISPOSABLE) ×3 IMPLANT
TOWEL GREEN STERILE FF (TOWEL DISPOSABLE) ×3 IMPLANT

## 2020-04-05 NOTE — Op Note (Signed)
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  Postoperative diagnosis: Same  Procedure: Right breast seed localized lumpectomy with bracketed lumpectomy performed in targeted right axillary lymph node biopsy seed localization injection of methylene blue dye with right axillary sentinel lymph node mapping and port removal  Surgeon: Erroll Luna, MD  Anesthesia: LMA with pectoral block and local anesthetic of 0.25% Marcaine plain  EBL: 30 cc  Specimen right wrist tissue with 2 seeds and a central clip for the bracketed lumpectomy, right axillary lymph node with seed and clip verified by Faxitron which was also the sentinel lymph node which was not blue but hot  Drains: None  IV fluids: Per anesthesia record  Indications for procedure: Patient is a 44 year old female who is completed neoadjuvant chemotherapy for stage II right breast cancer.  She had good response and presents today for breast conserving surgery as well as targeted lymph node biopsy and sentinel lymph node mapping.  She is done with chemotherapy and will have her port removed as well.The procedure has been discussed with the patient. Alternatives to surgery have been discussed with the patient.  Risks of surgery include bleeding,  Infection,  Seroma formation, death,  and the need for further surgery.   The patient understands and wishes to proceed. Sentinel lymph node mapping and dissection has been discussed with the patient.  Risk of bleeding,  Infection,  Seroma formation,  Additional procedures,,  Shoulder weakness , lymphedema shoulder stiffness,  Nerve and blood vessel injury and reaction to the mapping dyes have been discussed.  Alternatives to surgery have been discussed with the patient.  The patient agrees to proceed.  Description of procedure: The patient was met in the holding area.  Questions were answered.  Right breast was marked as the correct site.  She underwent pectoral block per  anesthesia and injection of technetium sulfur colloid per radiology.  She also had seeds placed which bracketed what was left of the tumor in the right breast and the right axillary lymph node which was biopsy preop and was positive.  She underwent technetium sulfur colloid injection as well.  All questions were answered.  She was then taken back the operating.  She is placed supine upon the OR table.  After induction of general esthesia the right breast was prepped and draped in sterile fashion.  The left upper chest was also prepped to bring the port into play.  Neoprobe was used after timeout was performed antibiotics were given.  We found the 2 seeds in the upper right breast.  Transverse incision was made over both seeds.  Dissection was carried down all tissue around both seeds were excised with the clip in the central portion of the.  Additional lateral margin was taken.  The Faxitron revealed the seed and clip in both seeds to be in that specimen.  The cavity was found to be hemostatic with cautery as needed.  We then irrigated and closed it with 3-0 Vicryl and 4 Monocryl.  4 cc of methylene blue due to blue dye were injected under the nipple.  This was massaged for 5 minutes.  We then used the neoprobe to identify the seed in the right axilla.  Incision was made in the axilla.  Dissection was carried down and the lymph nodes identified the contained the seed and excised.  There is also the sentinel node.  The Faxitron image of it revealed the seed and clip to be in the lymph node it was  sent to pathology.  We then changed the settings to technetium.  Background counts were baseline at this point with no further activity noted.  Inspection axilla revealed no adenopathy.  The long thoracic nerve, thoracodorsal trunk and axillary vein were all preserved.  We then applied hemoblast the cavity.  This was held in place for 2 minutes with a moist gauze.  We then inspected the axilla and was hemostatic.  We then  closed with 3-0 Vicryl for Monocryl.  The port was then identified.  We injected the previous scar with local anesthetic of 0.25% Sensorcaine plain.  This was made.  The port identified and pulled out through the incision.  The entire catheter removed and the port was removed in its entirety.  The port tract site was oversewn with 3-0 Vicryl.  3-0 Vicryl was used to close subcutaneous tissues and 4 Monocryl was used to close the skin in a subcuticular fashion.  Dermabond applied.  All counts were found to be correct at this portion of the case.  Breast binder was placed.  The patient was awoke extubated taken recovery in satisfactory condition.

## 2020-04-05 NOTE — Anesthesia Procedure Notes (Signed)
Anesthesia Regional Block: Pectoralis block   Pre-Anesthetic Checklist: ,, timeout performed, Correct Patient, Correct Site, Correct Laterality, Correct Procedure, Correct Position, site marked, Risks and benefits discussed, pre-op evaluation,  At surgeon's request and post-op pain management  Laterality: Right  Prep: Maximum Sterile Barrier Precautions used, chloraprep       Needles:  Injection technique: Single-shot  Needle Type: Echogenic Stimulator Needle     Needle Length: 9cm  Needle Gauge: 21     Additional Needles:   Procedures:,,,, ultrasound used (permanent image in chart),,,,  Narrative:  Start time: 04/05/2020 10:58 AM End time: 04/05/2020 11:08 AM Injection made incrementally with aspirations every 5 mL. Anesthesiologist: Roderic Palau, MD  Additional Notes: 2% Lidocaine skin wheel.

## 2020-04-05 NOTE — Transfer of Care (Signed)
Immediate Anesthesia Transfer of Care Note  Patient: Krista [name]  Procedure(s) Performed: RIGHT BREAST BRACKETED LUMPECTOMY WITH RADIOACTIVE SEED AND RIGHT AXILLARY  TARGETED LYMPH NODE BIOPSY AND SENTINEL LYMPH NODE MAPPING (Right Breast) REMOVAL PORT-A-CATH (Left Chest)  Patient Location: PACU  Anesthesia Type:GA combined with regional for post-op pain  Level of Consciousness: awake and alert   Airway & Oxygen Therapy: Patient Spontanous Breathing and Patient connected to nasal cannula oxygen  Post-op Assessment: Report given to RN and Post -op Vital signs reviewed and stable  Post vital signs: Reviewed and stable  Last Vitals:  Vitals Value Taken Time  BP 129/99 04/05/20 1250  Temp    Pulse 93 04/05/20 1250  Resp 21 04/05/20 1250  SpO2 93 % 04/05/20 1250    Last Pain:  Vitals:   04/05/20 1105  TempSrc:   PainSc: 0-No pain         Complications: No complications documented.

## 2020-04-05 NOTE — Discharge Instructions (Signed)
[facility] °Office Phone Number [phone] ° °BREAST BIOPSY/ PARTIAL MASTECTOMY: POST OP INSTRUCTIONS ° °Always review your discharge instruction sheet given to you by the facility where your surgery was performed. ° °IF YOU HAVE DISABILITY OR FAMILY LEAVE FORMS, YOU MUST BRING THEM TO THE OFFICE FOR PROCESSING.  DO NOT GIVE THEM TO YOUR DOCTOR. ° °1. A prescription for pain medication may be given to you upon discharge.  Take your pain medication as prescribed, if needed.  If narcotic pain medicine is not needed, then you may take acetaminophen (Tylenol) or ibuprofen (Advil) as needed. °2. Take your usually prescribed medications unless otherwise directed °3. If you need a refill on your pain medication, please contact your pharmacy.  They will contact our office to request authorization.  Prescriptions will not be filled after 5pm or on week-ends. °4. You should eat very light the first 24 hours after surgery, such as soup, crackers, pudding, etc.  Resume your normal diet the day after surgery. °5. Most patients will experience some swelling and bruising in the breast.  Ice packs and a good support bra will help.  Swelling and bruising can take several days to resolve.  °6. It is common to experience some constipation if taking pain medication after surgery.  Increasing fluid intake and taking a stool softener will usually help or prevent this problem from occurring.  A mild laxative (Milk of Magnesia or Miralax) should be taken according to package directions if there are no bowel movements after 48 hours. °7. Unless discharge instructions indicate otherwise, you may remove your bandages 24-48 hours after surgery, and you may shower at that time.  You may have steri-strips (small skin tapes) in place directly over the incision.  These strips should be left on the skin for 7-10 days.  If your surgeon used skin glue on the incision, you may shower in 24 hours.  The glue will flake off over the  next 2-3 weeks.  Any sutures or staples will be removed at the office during your follow-up visit. °8. ACTIVITIES:  You may resume regular daily activities (gradually increasing) beginning the next day.  Wearing a good support bra or sports bra minimizes pain and swelling.  You may have sexual intercourse when it is comfortable. °a. You may drive when you no longer are taking prescription pain medication, you can comfortably wear a seatbelt, and you can safely maneuver your car and apply brakes. °b. RETURN TO WORK:  ______________________________________________________________________________________ °9. You should see your doctor in the office for a follow-up appointment approximately two weeks after your surgery.  Your doctor’s nurse will typically make your follow-up appointment when she calls you with your pathology report.  Expect your pathology report 2-3 business days after your surgery.  You may call to check if you do not hear from us after three days. °10. OTHER INSTRUCTIONS: _______________________________________________________________________________________________ _____________________________________________________________________________________________________________________________________ °_____________________________________________________________________________________________________________________________________ °_____________________________________________________________________________________________________________________________________ ° °WHEN TO CALL YOUR DOCTOR: °1. Fever over 101.0 °2. Nausea and/or vomiting. °3. Extreme swelling or bruising. °4. Continued bleeding from incision. °5. Increased pain, redness, or drainage from the incision. ° °The clinic staff is available to answer your questions during regular business hours.  Please don’t hesitate to call and ask to speak to one of the nurses for clinical concerns.  If you have a medical emergency, go to the nearest  emergency room or call 911.  A surgeon from [facility] is always on call at the hospital. ° °For further questions, please visit [email or web address]  °

## 2020-04-05 NOTE — Interval H&P Note (Signed)
History and Physical Interval Note:  04/05/2020 10:30 AM  Krista [name]  has presented today for surgery, with the diagnosis of RIGHT BREAST CANCER.  The various methods of treatment have been discussed with the patient and family. After consideration of risks, benefits and other options for treatment, the patient has consented to  Procedure(s) with comments: RIGHT BREAST BRACKETED LUMPECTOMY WITH RADIOACTIVE SEED AND RIGHT AXILLARY  TARGETED LYMPH NODE BIOPSY AND SENTINEL LYMPH NODE MAPPING (Right) - PEC BLOCK REMOVAL PORT-A-CATH (N/A) as a surgical intervention.  The patient's history has been reviewed, patient examined, no change in status, stable for surgery.  I have reviewed the patient's chart and labs.  Questions were answered to the patient's satisfaction.     Turner Daniels MD

## 2020-04-05 NOTE — Anesthesia Procedure Notes (Signed)
Procedure Name: LMA Insertion Date/Time: 04/05/2020 11:22 AM Performed by: Inda Coke, CRNA Pre-anesthesia Checklist: Patient identified, Emergency Drugs available, Suction available and Patient being monitored Patient Re-evaluated:Patient Re-evaluated prior to induction Oxygen Delivery Method: Circle System Utilized Preoxygenation: Pre-oxygenation with 100% oxygen Induction Type: IV induction Ventilation: Mask ventilation without difficulty LMA: LMA inserted LMA Size: 4.0 Number of attempts: 1 Airway Equipment and Method: Bite block Placement Confirmation: positive ETCO2 Tube secured with: Tape Dental Injury: Teeth and Oropharynx as per pre-operative assessment

## 2020-04-06 ENCOUNTER — Encounter (HOSPITAL_COMMUNITY): Payer: Self-pay | Admitting: Surgery

## 2020-04-06 NOTE — Anesthesia Postprocedure Evaluation (Signed)
Anesthesia Post Note  Patient: Krista [name]  Procedure(s) Performed: RIGHT BREAST BRACKETED LUMPECTOMY WITH RADIOACTIVE SEED AND RIGHT AXILLARY  TARGETED LYMPH NODE BIOPSY AND SENTINEL LYMPH NODE MAPPING (Right Breast) REMOVAL PORT-A-CATH (Left Chest)     Patient location during evaluation: PACU Anesthesia Type: General Level of consciousness: awake and alert Pain management: pain level controlled Vital Signs Assessment: post-procedure vital signs reviewed and stable Respiratory status: spontaneous breathing, nonlabored ventilation and respiratory function stable Cardiovascular status: blood pressure returned to baseline Postop Assessment: no apparent nausea or vomiting Anesthetic complications: no   No complications documented.  Last Vitals:  Vitals:   04/05/20 1420 04/05/20 1435  BP: (!) 140/102 (!) 149/102  Pulse: 83 85  Resp: 12 13  Temp:  (!) 36.1 C  SpO2: 99% 100%    Last Pain:  Vitals:   04/05/20 1420  TempSrc:   PainSc: Asleep                 Namiah Dunnavant,W. EDMOND

## 2020-04-09 ENCOUNTER — Ambulatory Visit: Payer: BC Managed Care – PPO | Admitting: Oncology

## 2020-04-09 ENCOUNTER — Encounter: Payer: Self-pay | Admitting: *Deleted

## 2020-04-09 ENCOUNTER — Other Ambulatory Visit: Payer: BC Managed Care – PPO

## 2020-04-09 DIAGNOSIS — C50211 Malignant neoplasm of upper-inner quadrant of right female breast: Secondary | ICD-10-CM

## 2020-04-09 DIAGNOSIS — Z171 Estrogen receptor negative status [ER-]: Secondary | ICD-10-CM

## 2020-04-12 ENCOUNTER — Other Ambulatory Visit: Payer: Self-pay

## 2020-04-12 ENCOUNTER — Emergency Department (HOSPITAL_COMMUNITY)
Admission: EM | Admit: 2020-04-12 | Discharge: 2020-04-12 | Disposition: A | Discharge status: Home / Self Care | Payer: Medicaid Other | Attending: Emergency Medicine | Admitting: Emergency Medicine

## 2020-04-12 ENCOUNTER — Ambulatory Visit: Payer: Self-pay | Admitting: Surgery

## 2020-04-12 DIAGNOSIS — R202 Paresthesia of skin: Secondary | ICD-10-CM | POA: Diagnosis present

## 2020-04-12 DIAGNOSIS — Z853 Personal history of malignant neoplasm of breast: Secondary | ICD-10-CM | POA: Insufficient documentation

## 2020-04-12 DIAGNOSIS — J45909 Unspecified asthma, uncomplicated: Secondary | ICD-10-CM | POA: Diagnosis not present

## 2020-04-12 NOTE — Discharge Instructions (Signed)
You were seen in the emergency department for evaluation of soreness at your surgical site and some intermittent numbness in your right arm.  This is likely due to nerve injury near the site of the surgery and often will go away with time.  Keep your follow-up with Dr. Brantley Stage.  Advil as needed for pain.  Return to the emergency department for any worsening or concerning symptoms

## 2020-04-12 NOTE — ED Triage Notes (Addendum)
Patient is A&O x4  ambulatory. She arrived by personal vehicle. She had a  surgical procedure last Thursday to take out power port and cancer cells. Patient is still feeling numbness and sore x 3days.

## 2020-04-12 NOTE — ED Provider Notes (Signed)
[facility] DEPT Provider Note   CSN: [id number] Arrival date & time: 04/12/20  1910     History Chief Complaint  Patient presents with  . Numbness    Krista [name] is a 44 y.o. female.  She has a history of breast surgery and underwent chemotherapy.  She is currently off chemotherapy now.  She had a axillary node biopsy and a port removal along with seed removal 1 week ago by Dr. Brantley Stage.  She is noticed some soreness in her axilla and feeling intermittent numbness in her right third fourth and fifth finger that is been intermittent since then.  She mentioned it to her surgeon at the follow-up appointment but they told her that can be normal.  No significant worsening today but she just wanted to get it checked out.  No fevers or chills no no weakness.  No redness at her surgical sites and no drainage.  No pain. The history is provided by the patient.       Past Medical History:  Diagnosis Date  . Anemia    history of  . Cancer [facility])    right breast cancer 10/03/19, received chemotherapy  . Family history of breast cancer   . Family history of cervical cancer   . Family history of lung cancer   . Hemorrhoids     Patient Active Problem List   Diagnosis Date Noted  . 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 Breast cancer, BRCA1 positive, right ([place]) 11/29/2019  . Port-A-Cath in place 11/29/2019  . Encounter for antineoplastic chemotherapy 11/25/2019  . BRCA1 gene mutation positive 11/25/2019  . Goals of care, counseling/discussion 10/24/2019  . Family history of breast cancer   . Family history of lung cancer   . Family history of cervical cancer   . Malignant neoplasm of upper-inner quadrant of right breast in female, estrogen receptor negative ([place]) 10/17/2019  . Thalassemia trait 10/17/2019    Past Surgical History:  Procedure Laterality Date  . BREAST LUMPECTOMY WITH RADIOACTIVE SEED AND SENTINEL LYMPH NODE  BIOPSY Right 04/05/2020   Procedure: RIGHT BREAST BRACKETED LUMPECTOMY WITH RADIOACTIVE SEED AND RIGHT AXILLARY  TARGETED LYMPH NODE BIOPSY AND SENTINEL LYMPH NODE MAPPING;  Surgeon: Erroll Luna, MD;  Location: [facility];  Service: General;  Laterality: Right;  PEC BLOCK  . IR IMAGING GUIDED PORT INSERTION  11/07/2019  . PORT-A-CATH REMOVAL Left 04/05/2020   Procedure: REMOVAL PORT-A-CATH;  Surgeon: Erroll Luna, MD;  Location: [facility];  Service: General;  Laterality: Left;     OB History   No obstetric history on file.     Family History  Problem Relation Age of Onset  . Breast cancer Mother 13       double mastectomy  . Breast cancer Half-Sister 2  . Cirrhosis Half-Sister   . Heart Problems Father   . Stroke Father   . Breast cancer Maternal Aunt 67  . Cancer Maternal Uncle 54       unknown type  . Ovarian cancer Maternal Grandmother        dx. in her mid-40s  . Lung cancer Paternal Grandmother   . Cancer Other        unknown type; maternal great-uncles/aunts (x3)  . Diabetes Half-Brother   . Cancer Other        unknown type; matenral great-uncles/aunt (x4)  . Cancer Maternal Great-grandfather        unknown type  . Breast cancer Cousin 38  paternal cousin    Social History   Tobacco Use  . Smoking status: Never Smoker  . Smokeless tobacco: Never Used  Substance Use Topics  . Alcohol use: Not Currently  . Drug use: Never    Home Medications Prior to Admission medications   Medication Sig Start Date End Date Taking? Authorizing Provider  acetaminophen (TYLENOL) 500 MG tablet Take 1-2 tablets (500-1,000 mg total) by mouth every 6 (six) hours as needed. Patient taking differently: Take 500-1,000 mg by mouth every 6 (six) hours as needed for moderate pain or fever.  11/07/19   Magrinat, [name], MD  Biotin w/ Vitamins C & E (HAIR/SKIN/NAILS PO) Take 1 tablet by mouth daily.    [provider]  Cholecalciferol (VITAMIN D3) 250 MCG (10000 UT) capsule Take  10,000 Units by mouth daily.    [provider]  dexamethasone (DECADRON) 4 MG tablet Take 2 tablets by mouth daily starting the day after Cytoxan x 3 days. Take with food. Patient not taking: Reported on 03/22/2020 11/07/19   Magrinat, [name], MD  ferrous gluconate (FERGON) 324 MG tablet Take 1 tablet (324 mg total) by mouth daily with breakfast. Patient not taking: Reported on 03/22/2020 02/28/20   Magrinat, [name], MD  ibuprofen (ADVIL) 800 MG tablet Take 1 tablet (800 mg total) by mouth every 8 (eight) hours as needed. 04/05/20   Cornett, [name], MD  lidocaine-prilocaine (EMLA) cream Apply to affected area once Patient not taking: Reported on 03/22/2020 10/17/19   Magrinat, [name], MD  loratadine (CLARITIN) 10 MG tablet Take 1 tablet (10 mg total) by mouth daily. Patient not taking: Reported on 03/22/2020 11/07/19   Magrinat, [name], MD  LORazepam (ATIVAN) 0.5 MG tablet Take 1 tablet (0.5 mg total) by mouth at bedtime as needed (Nausea or vomiting). Patient not taking: Reported on 03/22/2020 10/17/19   Magrinat, [name], MD  MILK THISTLE PO Take 1 capsule by mouth daily.    [provider]  Misc Natural Products [facility]) CAPS Take 2 capsules by mouth daily.    [provider]  Misc Natural Products (PHYTOCILLIN) CAPS Take 2 capsules by mouth daily.    [provider]  OVER THE COUNTER MEDICATION Take 10 mLs by mouth 2 (two) times daily. floradix otc supplement    [provider]  OVER THE COUNTER MEDICATION Take 2 capsules by mouth daily. kyolic aged garlic otc supplement    [provider]  oxyCODONE (OXY IR/ROXICODONE) 5 MG immediate release tablet Take 1 tablet (5 mg total) by mouth every 6 (six) hours as needed for severe pain. 04/05/20   Cornett, [name], MD  prochlorperazine (COMPAZINE) 10 MG tablet Take 1 tablet (10 mg total) by mouth every 6 (six) hours as needed (Nausea or vomiting). Patient not taking: Reported on 03/22/2020  10/17/19   Magrinat, [name], MD  valACYclovir (VALTREX) 1000 MG tablet TAKE 1 TABLET BY MOUTH EVERY DAY Patient not taking: Reported on 03/22/2020 01/17/20   Magrinat, [name], MD    Allergies    Emend [fosaprepitant]  Review of Systems   Review of Systems  Constitutional: Negative for fever.  HENT: Negative for sore throat.   Eyes: Negative for visual disturbance.  Respiratory: Negative for shortness of breath.   Cardiovascular: Negative for chest pain.  Gastrointestinal: Negative for abdominal pain.  Genitourinary: Negative for dysuria.  Musculoskeletal: Negative for neck pain.  Skin: Negative for rash.  Neurological: Positive for numbness. Negative for weakness.  Physical Exam Updated Vital Signs BP (!) 164/114 (BP Location: Right Arm)   Pulse 96   Temp 98.3 F (36.8 C) (Oral)   Resp 18   Ht 5' 7"  (1.702 m)   Wt 92.1 kg   SpO2 100%   BMI 31.79 kg/m   Physical Exam Vitals and nursing note reviewed.  Constitutional:      General: She is not in acute distress.    Appearance: She is well-developed.  HENT:     Head: Normocephalic and atraumatic.  Eyes:     Conjunctiva/sclera: Conjunctivae normal.  Cardiovascular:     Rate and Rhythm: Normal rate and regular rhythm.     Heart sounds: No murmur heard.   Pulmonary:     Effort: Pulmonary effort is normal. No respiratory distress.     Breath sounds: Normal breath sounds.  Chest:     Comments: She has a surgical incision over her right upper breast approximately 4 cm with no induration no tenderness.  She has a similar incision in her right axilla that has some tenderness no induration. Abdominal:     Palpations: Abdomen is soft.     Tenderness: There is no abdominal tenderness.  Musculoskeletal:     Cervical back: Neck supple.  Skin:    General: Skin is warm and dry.  Neurological:     Mental Status: She is alert.     GCS: GCS eye subscore is 4. GCS verbal subscore is 5. GCS motor subscore is 6.     Cranial  Nerves: Cranial nerves are intact.     Sensory: Sensation is intact.     Motor: Motor function is intact.     Comments: Right upper extremity she is got full sensation of radian ulnar median distribution nerves.     ED Results / Procedures / Treatments   Labs (all labs ordered are listed, but only abnormal results are displayed) Labs Reviewed - No data to display  EKG None  Radiology No results found.  Procedures Procedures (including critical care time)  Medications Ordered in ED Medications - No data to display  ED Course  I have reviewed the triage vital signs and the nursing notes.  Pertinent labs & imaging results that were available during my care of the patient were reviewed by me and considered in my medical decision making (see chart for details).  Clinical Course as of Apr 13 1218  Thu Apr 12, 2020  2012 Discussed with Dr. Juleen Starr surgery who said this is a recognized after effect of the surgery and usually improves over time.  He said his nausea is not signs of a huge hematoma there that she was fine to follow-up in the office.   [MB]    Clinical Course User Index [MB] Hayden Rasmussen, MD   MDM Rules/Calculators/A&P                         Differential includes postoperative neuralgia, hematoma compressing on nerve, postoperative infection, vascular injury, nerve injury  Final Clinical Impression(s) / ED Diagnoses Final diagnoses:  Paresthesia    Rx / DC Orders ED Discharge Orders    None       Hayden Rasmussen, MD 04/13/20 1220

## 2020-04-13 ENCOUNTER — Telehealth: Payer: Self-pay

## 2020-04-13 NOTE — Telephone Encounter (Signed)
This LPN called to check on pt. States she had her mind put at ease when being seen in ED but has concerns about bx she would like to speak with Dr Jana Hakim about. She will discuss this with him 10/19 at her appt.

## 2020-04-16 NOTE — Progress Notes (Signed)
[place]  Telephone:([phone]) [phone] Fax:(336) [phone]     ID: Krista [name] DOB: Aug 17, 1975  MR#: [id number]  FVC#:[id number]  Patient Care Team: Everett Graff, MD as PCP - General (Obstetrics and Gynecology) Julina Altmann, [name], MD as Consulting Physician (Oncology) Erroll Luna, MD as Consulting Physician (General Surgery) Eppie Gibson, MD as Attending Physician (Radiation Oncology) Rockwell Germany, RN as Oncology Nurse Navigator Mauro Kaufmann, RN as Oncology Nurse Navigator Chauncey Cruel, MD OTHER MD:   CHIEF COMPLAINT: Triple negative breast cancer  CURRENT TREATMENT: Awaiting further surgery   INTERVAL HISTORY: Krista [name] returns today for follow-up of her triple negative breast cancer accompanied by her son Krista [name].  Since her last visit, she underwent breast MRI on 03/07/2020 showing: breast composition B; marked positive response to chemotherapy, right breast mass significantly decreased with only residual non-mass enhancement persisting, and metastatic right axillary lymph node also significantly decreased in size; no evidence of new right breast or left breast malignancy.  She opted to proceed with right lumpectomy on 04/05/2020 under Dr. Brantley Stage. Pathology from the procedure ([id number]) showed: invasive ductal carcinoma, grade 3, 4 cm, with associated fibrosis and histiocytic infiltrates; margins not involved.  The biopsied lymph node was positive for metastatic carcinoma and measured 0.6 cm (1/1).   REVIEW OF SYSTEMS: Krista [name] did well with her surgery, with minimal pain, no bleeding, no fever.  She continues to work on mostly virtually.  Her husband unfortunately was not able to be here today.  He is now working for [employer] doing their websites and other IT things.  He is interested in a resume visit next time.  Krista [name] did have some numbness and tingling in her right arm which took her to the emergency room 1014.  No  imaging was obtained.  Aside from these issues detailed review of systems today was stable.   HISTORY OF CURRENT ILLNESS: From the original intake note:  [name]"] herself palpated a lump in the upper-inner right breast sometime late December 2020 or early January 2021.  As the mass grew larger and persisted through menstrual periods she brought it to medical attention and underwent bilateral diagnostic mammography with tomography and bilateral breast ultrasonography at [facility] on 09/26/2019 showing: breast density category C; 5.1 cm mass involving upper-inner quadrant of right breast; no evidence of right axillary lymphadenopathy or left breast malignancy.  Accordingly on 10/03/2019 she proceeded to biopsy of the right breast area in question. The pathology from this procedure ([id number]) showed: invasive ductal carcinoma, grade 3. Prognostic indicators significant for: estrogen receptor, 0% negative and progesterone receptor, 0% negative. Proliferation marker Ki67 at 80%. HER2 negative by immunohistochemistry (1+).  The patient's subsequent history is as detailed below.   PAST MEDICAL HISTORY: Past Medical History:  Diagnosis Date  . Anemia    history of  . Cancer [facility])    right breast cancer 10/03/19, received chemotherapy  . Family history of breast cancer   . Family history of cervical cancer   . Family history of lung cancer   . Hemorrhoids     PAST SURGICAL HISTORY: No prior surgeries   FAMILY HISTORY: Family History  Problem Relation Age of Onset  . Breast cancer Mother 52       double mastectomy  . Breast cancer Half-Sister 90  . Cirrhosis Half-Sister   . Heart Problems Father   . Stroke Father   . Breast cancer Maternal Aunt 22  .  Cancer Maternal Uncle 88       unknown type  . Ovarian cancer Maternal Grandmother        dx. in her mid-40s  . Lung cancer Paternal Grandmother   . Cancer Other        unknown type; maternal  great-uncles/aunts (x3)  . Diabetes Half-Brother   . Cancer Other        unknown type; matenral great-uncles/aunt (x4)  . Cancer Maternal Great-grandfather        unknown type  . Breast cancer Cousin 68       paternal cousin  The patient's father died at age 77 from cardiac complications of drug use.  His mother, the patient's paternal grandmother had what seems to have been cancer of the lung metastatic to the spine.  There was no other cancer on his side of the family to the patient's knowledge.  The patient's mother died at age 85 in the setting of multiple medical issues but she had undergone double mastectomy at the age of 54.  Her mother, the patient's maternal grandmother had cervical cancer.  The patient's mother had 3 brothers and 3 sisters.  1 of those 3 sisters had breast cancer diagnosed at the age of 41.  The patient herself has 2 brothers and 3 sisters one of her sisters was diagnosed with breast cancer at the age of 37 and died at the age of 60   GYNECOLOGIC HISTORY:  No LMP recorded. (Menstrual status: Chemotherapy). Menarche: 44 years old Age at first live birth: 44 years old [facility] [facility] P 3 LMP regular, last approximately 5 days, of which the second day is heavy Contraceptive: Husband status post vasectomy HRT no  Hysterectomy? no BSO?  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  Her husband [name] is a Research scientist (medical) and also Librarian, academic at [employer].  Their children are Krista [name], 20, who is Scientist, product/process development at [employer], 18, who will be going to [employer] this coming year and then switch over to [employer], [employer] and aiming for an art rehabilitation degree; and Alanna, 41, currently attending [employer].  The patient is not a church attender    ADVANCED DIRECTIVES: In the absence of any documents to the contrary the patient's husband is her healthcare power of attorney   HEALTH  MAINTENANCE: Social History   Tobacco Use  . Smoking status: Never Smoker  . Smokeless tobacco: Never Used  Substance Use Topics  . Alcohol use: Not Currently  . Drug use: Never     Colonoscopy: n/a (age)  PAP: UTD  Bone density: n/a (age)   Allergies  Allergen Reactions  . Emend [Fosaprepitant] Shortness Of Breath    Current Outpatient Medications  Medication Sig Dispense Refill  . acetaminophen (TYLENOL) 500 MG tablet Take 1-2 tablets (500-1,000 mg total) by mouth every 6 (six) hours as needed. (Patient taking differently: Take 500-1,000 mg by mouth every 6 (six) hours as needed for moderate pain or fever. ) 60 tablet 0  . Biotin w/ Vitamins C & E (HAIR/SKIN/NAILS PO) Take 1 tablet by mouth daily.    . Cholecalciferol (VITAMIN D3) 250 MCG (10000 UT) capsule Take 10,000 Units by mouth daily.    Marland Kitchen dexamethasone (DECADRON) 4 MG tablet Take 2 tablets by mouth daily starting the day after Cytoxan x 3 days. Take with food. (Patient not taking: Reported on 03/22/2020) 30 tablet 1  .  ferrous gluconate (FERGON) 324 MG tablet Take 1 tablet (324 mg total) by mouth daily with breakfast. (Patient not taking: Reported on 03/22/2020) 100 tablet 3  . ibuprofen (ADVIL) 800 MG tablet Take 1 tablet (800 mg total) by mouth every 8 (eight) hours as needed. 30 tablet 0  . lidocaine-prilocaine (EMLA) cream Apply to affected area once (Patient not taking: Reported on 03/22/2020) 30 g 3  . loratadine (CLARITIN) 10 MG tablet Take 1 tablet (10 mg total) by mouth daily. (Patient not taking: Reported on 03/22/2020) 60 tablet 0  . LORazepam (ATIVAN) 0.5 MG tablet Take 1 tablet (0.5 mg total) by mouth at bedtime as needed (Nausea or vomiting). (Patient not taking: Reported on 03/22/2020) 30 tablet 0  . MILK THISTLE PO Take 1 capsule by mouth daily.    . Misc Natural Products [facility]) CAPS Take 2 capsules by mouth daily.    . Misc Natural Products (PHYTOCILLIN) CAPS Take 2 capsules by mouth daily.    Marland Kitchen OVER THE  COUNTER MEDICATION Take 10 mLs by mouth 2 (two) times daily. floradix otc supplement    . OVER THE COUNTER MEDICATION Take 2 capsules by mouth daily. kyolic aged garlic otc supplement    . oxyCODONE (OXY IR/ROXICODONE) 5 MG immediate release tablet Take 1 tablet (5 mg total) by mouth every 6 (six) hours as needed for severe pain. 15 tablet 0  . prochlorperazine (COMPAZINE) 10 MG tablet Take 1 tablet (10 mg total) by mouth every 6 (six) hours as needed (Nausea or vomiting). (Patient not taking: Reported on 03/22/2020) 30 tablet 1  . valACYclovir (VALTREX) 1000 MG tablet TAKE 1 TABLET BY MOUTH EVERY DAY (Patient not taking: Reported on 03/22/2020) 90 tablet 1   No current facility-administered medications for this visit.    OBJECTIVE: African-American woman who appears stated age  6:   04/17/20 0947  BP: (!) 144/93  Pulse: 86  Resp: (!) 86  Temp: (!) 97 F (36.1 C)  SpO2: 100%   Wt Readings from Last 3 Encounters:  04/17/20 206 lb 9.6 oz (93.7 kg)  04/12/20 203 lb (92.1 kg)  04/05/20 203 lb (92.1 kg)   Body mass index is 32.36 kg/m.    ECOG FS:1 - Symptomatic but completely ambulatory  Sclerae unicteric, EOMs intact Wearing a mask No cervical or supraclavicular adenopathy Lungs no rales or rhonchi Heart regular rate and rhythm Abd soft, nontender, positive bowel sounds MSK no focal spinal tenderness, no upper extremity lymphedema Neuro: nonfocal, well oriented, appropriate affect Breasts: The right breast is status post recent lumpectomy.  The incisions are healing very nicely.  There is no erythema dehiscence or swelling.  The cosmetic result is excellent.  The left breast is benign.  Both axillae are benign.   LAB RESULTS:  CMP     Component Value Date/Time   NA 142 04/17/2020 0843   K 3.7 04/17/2020 0843   CL 105 04/17/2020 0843   CO2 30 04/17/2020 0843   GLUCOSE 119 (H) 04/17/2020 0843   BUN 9 04/17/2020 0843   CREATININE 0.82 04/17/2020 0843   CREATININE  0.95 10/17/2019 1529   CALCIUM 9.8 04/17/2020 0843   PROT 7.0 04/17/2020 0843   ALBUMIN 3.8 04/17/2020 0843   AST 21 04/17/2020 0843   AST 16 10/17/2019 1529   ALT 15 04/17/2020 0843   ALT 13 10/17/2019 1529   ALKPHOS 70 04/17/2020 0843   BILITOT 0.8 04/17/2020 0843   BILITOT 1.1 10/17/2019 1529   GFRNONAA >60 04/17/2020 9323  GFRNONAA >60 10/17/2019 1529   GFRAA >60 03/27/2020 1008   GFRAA >60 10/17/2019 1529    No results found for: TOTALPROTELP, ALBUMINELP, A1GS, A2GS, BETS, BETA2SER, GAMS, MSPIKE, SPEI  Lab Results  Component Value Date   WBC 6.5 04/17/2020   NEUTROABS 4.1 04/17/2020   HGB 11.0 (L) 04/17/2020   HCT 31.7 (L) 04/17/2020   MCV 79.3 (L) 04/17/2020   PLT 223 04/17/2020    No results found for: LABCA2  No components found for: [id number]  No results for input(s): INR in the last 168 hours.  No results found for: LABCA2  No results found for: CAN199  No results found for: WUJ811  No results found for: BJY782  Lab Results  Component Value Date   CA2729 29.8 11/16/2019    No components found for: HGQUANT  No results found for: CEA1 / No results found for: CEA1   No results found for: AFPTUMOR  No results found for: CHROMOGRNA  No results found for: KPAFRELGTCHN, LAMBDASER, KAPLAMBRATIO (kappa/lambda light chains)  No results found for: HGBA, HGBA2QUANT, HGBFQUANT, HGBSQUAN (Hemoglobinopathy evaluation)   No results found for: LDH  Lab Results  Component Value Date   IRON 99 10/17/2019   TIBC 391 10/17/2019   IRONPCTSAT 25 10/17/2019   (Iron and TIBC)  Lab Results  Component Value Date   FERRITIN 45 10/17/2019    Urinalysis No results found for: COLORURINE, APPEARANCEUR, LABSPEC, PHURINE, GLUCOSEU, HGBUR, BILIRUBINUR, KETONESUR, PROTEINUR, UROBILINOGEN, NITRITE, LEUKOCYTESUR   STUDIES: NM Sentinel Node Inj-No Rpt (Breast)  Result Date: 04/05/2020 Sulfur colloid was injected by the nuclear medicine technologist for melanoma  sentinel node.   MM Breast Surgical Specimen  Result Date: 04/05/2020 CLINICAL DATA:  Surgical excision of a previously biopsied metastatic right axillary lymph node. EXAM: SPECIMEN RADIOGRAPH OF THE RIGHT AXILLA COMPARISON:  Previous exam(s). FINDINGS: Status post excision of the left axilla. The radioactive seed and Q shaped biopsy marker clip within the previously biopsied metastatic lymph are present, completely intact, and were marked for pathology. IMPRESSION: Specimen radiograph of the right axilla. Electronically Signed   By: Claudie Revering M.D.   On: 04/05/2020 12:21   MM Breast Surgical Specimen  Result Date: 04/05/2020 CLINICAL DATA:  Right lumpectomy for breast cancer with bracketed seed localization, including a ribbon shaped biopsy marker clip. EXAM: SPECIMEN RADIOGRAPH OF THE RIGHT BREAST COMPARISON:  Previous exam(s). FINDINGS: Status post excision of the right breast. 2 radioactive seeds and a ribbon shaped biopsy marker clip are present, completely intact, and were marked for pathology. IMPRESSION: Specimen radiograph of the right breast. Electronically Signed   By: Claudie Revering M.D.   On: 04/05/2020 11:59   MM RT RADIOACTIVE SEED LOC MAMMO GUIDE  Result Date: 04/04/2020 CLINICAL DATA:  44 year old female for ultrasound-guided radioactive seed localization of low RIGHT axillary lymph node metastasis and for mammographic guided radioactive seed bracketing of RIGHT breast cancer. EXAM: ULTRASOUND GUIDED RADIOACTIVE SEED LOCALIZATION OF THE RIGHT BREAST MAMMOGRAPHIC GUIDED RADIOACTIVE SEED LOCALIZATION OF THE RIGHT BREAST X 2 COMPARISON:  Previous exam(s). FINDINGS: Patient presents for radioactive seed localization prior to RIGHT lumpectomy and targeted RIGHT axillary lymph node excision. I met with the patient and we discussed the procedures of seed localization including benefits and alternatives. We discussed the high likelihood of successful procedures. We discussed the risks of the  procedures including infection, bleeding, tissue injury and further surgery. We discussed the low dose of radioactivity involved in the procedures. Informed, written consent was given. The usual  time-out protocol was performed immediately prior to the procedures. ULTRASOUND GUIDED RADIOACTIVE SEED LOCALIZATION OF THE RIGHT BREAST: Using ultrasound guidance, sterile technique, 1% lidocaine and an I-125 radioactive seed, the low RIGHT axillary lymph node containing a Q biopsy clip was localized using a LATERAL approach. This lymph node and seed were not visualized on follow-up mammogram images due to the far posterior location. Follow-up survey of the patient confirms presence of the radioactive seed. Order number of I-125 seed:  [id number]. Total activity:  6.203 millicuries.  Reference Date: 03/20/2020 MAMMOGRAPHIC GUIDED RADIOACTIVE SEED LOCALIZATION OF THE RIGHT BREAST x 2 Using mammographic guidance, sterile technique, 1% lidocaine and an I-125 radioactive seed, the residual density at the site of known RIGHT breast malignancy was bracketed anterior/posterior using a LATERAL approach. The anterior seed was placed along the expected anterior aspect of the known malignancy. The posterior seed was placed as far posteriorly as possible but likely within the posterior aspect of the known malignancy. The 2 seeds are separated by a distance of 4 cm in the AP dimension. The follow-up mammogram images confirm the seeds in the expected location and were marked for Dr. Brantley Stage. Follow-up survey of the patient confirms presence of the radioactive seeds. Anterior seed: Order number of I-125 seed:  [id number]. Total activity:  4.536 millicuries.  Reference Date: 03/20/2020. Posterior seed: Order number of I-125 seed:  [id number]. Total activity:  4.825 millicuries.  Reference Date: 03/20/2020. The patient tolerated the procedures well and was released from the [facility]. She was given instructions regarding seed removal.  IMPRESSION: Radioactive seed localization of the RIGHT breast and low RIGHT axilla. The known malignancy within the RIGHT breast was bracketed with the anterior seed placed along the expected anterior aspect and the posterior seed placed as far posteriorly as possible but likely within the posterior aspect of the known malignancy. The radioactive seed placed within the low RIGHT axillary lymph node metastasis is not visualized mammographically due to far posterior location. Electronically Signed   By: Margarette Canada M.D.   On: 04/04/2020 13:26   MM RT RADIO SEED EA ADD LESION LOC MAMMO  Result Date: 04/04/2020 CLINICAL DATA:  44 year old female for ultrasound-guided radioactive seed localization of low RIGHT axillary lymph node metastasis and for mammographic guided radioactive seed bracketing of RIGHT breast cancer. EXAM: ULTRASOUND GUIDED RADIOACTIVE SEED LOCALIZATION OF THE RIGHT BREAST MAMMOGRAPHIC GUIDED RADIOACTIVE SEED LOCALIZATION OF THE RIGHT BREAST X 2 COMPARISON:  Previous exam(s). FINDINGS: Patient presents for radioactive seed localization prior to RIGHT lumpectomy and targeted RIGHT axillary lymph node excision. I met with the patient and we discussed the procedures of seed localization including benefits and alternatives. We discussed the high likelihood of successful procedures. We discussed the risks of the procedures including infection, bleeding, tissue injury and further surgery. We discussed the low dose of radioactivity involved in the procedures. Informed, written consent was given. The usual time-out protocol was performed immediately prior to the procedures. ULTRASOUND GUIDED RADIOACTIVE SEED LOCALIZATION OF THE RIGHT BREAST: Using ultrasound guidance, sterile technique, 1% lidocaine and an I-125 radioactive seed, the low RIGHT axillary lymph node containing a Q biopsy clip was localized using a LATERAL approach. This lymph node and seed were not visualized on follow-up mammogram images due  to the far posterior location. Follow-up survey of the patient confirms presence of the radioactive seed. Order number of I-125 seed:  [id number]. Total activity:  9.169 millicuries.  Reference Date: 03/20/2020 MAMMOGRAPHIC GUIDED RADIOACTIVE SEED LOCALIZATION OF THE RIGHT BREAST x  2 Using mammographic guidance, sterile technique, 1% lidocaine and an I-125 radioactive seed, the residual density at the site of known RIGHT breast malignancy was bracketed anterior/posterior using a LATERAL approach. The anterior seed was placed along the expected anterior aspect of the known malignancy. The posterior seed was placed as far posteriorly as possible but likely within the posterior aspect of the known malignancy. The 2 seeds are separated by a distance of 4 cm in the AP dimension. The follow-up mammogram images confirm the seeds in the expected location and were marked for Dr. Brantley Stage. Follow-up survey of the patient confirms presence of the radioactive seeds. Anterior seed: Order number of I-125 seed:  [id number]. Total activity:  8.657 millicuries.  Reference Date: 03/20/2020. Posterior seed: Order number of I-125 seed:  [id number]. Total activity:  8.413 millicuries.  Reference Date: 03/20/2020. The patient tolerated the procedures well and was released from the Breast Center. She was given instructions regarding seed removal. IMPRESSION: Radioactive seed localization of the RIGHT breast and low RIGHT axilla. The known malignancy within the RIGHT breast was bracketed with the anterior seed placed along the expected anterior aspect and the posterior seed placed as far posteriorly as possible but likely within the posterior aspect of the known malignancy. The radioactive seed placed within the low RIGHT axillary lymph node metastasis is not visualized mammographically due to far posterior location. Electronically Signed   By: Margarette Canada M.D.   On: 04/04/2020 13:26   [place] RT RADIOACTIVE SEED LOC  Result Date:  04/04/2020 CLINICAL DATA:  44 year old female for ultrasound-guided radioactive seed localization of low RIGHT axillary lymph node metastasis and for mammographic guided radioactive seed bracketing of RIGHT breast cancer. EXAM: ULTRASOUND GUIDED RADIOACTIVE SEED LOCALIZATION OF THE RIGHT BREAST MAMMOGRAPHIC GUIDED RADIOACTIVE SEED LOCALIZATION OF THE RIGHT BREAST X 2 COMPARISON:  Previous exam(s). FINDINGS: Patient presents for radioactive seed localization prior to RIGHT lumpectomy and targeted RIGHT axillary lymph node excision. I met with the patient and we discussed the procedures of seed localization including benefits and alternatives. We discussed the high likelihood of successful procedures. We discussed the risks of the procedures including infection, bleeding, tissue injury and further surgery. We discussed the low dose of radioactivity involved in the procedures. Informed, written consent was given. The usual time-out protocol was performed immediately prior to the procedures. ULTRASOUND GUIDED RADIOACTIVE SEED LOCALIZATION OF THE RIGHT BREAST: Using ultrasound guidance, sterile technique, 1% lidocaine and an I-125 radioactive seed, the low RIGHT axillary lymph node containing a Q biopsy clip was localized using a LATERAL approach. This lymph node and seed were not visualized on follow-up mammogram images due to the far posterior location. Follow-up survey of the patient confirms presence of the radioactive seed. Order number of I-125 seed:  [id number]. Total activity:  5.366 millicuries.  Reference Date: 03/20/2020 MAMMOGRAPHIC GUIDED RADIOACTIVE SEED LOCALIZATION OF THE RIGHT BREAST x 2 Using mammographic guidance, sterile technique, 1% lidocaine and an I-125 radioactive seed, the residual density at the site of known RIGHT breast malignancy was bracketed anterior/posterior using a LATERAL approach. The anterior seed was placed along the expected anterior aspect of the known malignancy. The posterior  seed was placed as far posteriorly as possible but likely within the posterior aspect of the known malignancy. The 2 seeds are separated by a distance of 4 cm in the AP dimension. The follow-up mammogram images confirm the seeds in the expected location and were marked for Dr. Brantley Stage. Follow-up survey of the patient confirms presence of the  radioactive seeds. Anterior seed: Order number of I-125 seed:  [id number]. Total activity:  2.536 millicuries.  Reference Date: 03/20/2020. Posterior seed: Order number of I-125 seed:  [id number]. Total activity:  5.956 millicuries.  Reference Date: 03/20/2020. The patient tolerated the procedures well and was released from the Breast Center. She was given instructions regarding seed removal. IMPRESSION: Radioactive seed localization of the RIGHT breast and low RIGHT axilla. The known malignancy within the RIGHT breast was bracketed with the anterior seed placed along the expected anterior aspect and the posterior seed placed as far posteriorly as possible but likely within the posterior aspect of the known malignancy. The radioactive seed placed within the low RIGHT axillary lymph node metastasis is not visualized mammographically due to far posterior location. Electronically Signed   By: Margarette Canada M.D.   On: 04/04/2020 13:26     ELIGIBLE FOR AVAILABLE RESEARCH PROTOCOL: [id number]  ASSESSMENT: 44 y.o. [place] woman status post right breast upper inner quadrant biopsy 10/03/2019 for a clinical mT3 N1, stage IIIC invasive ductal carcinoma, grade 3, triple negative, with an MIB-1 of 80%  (a) biopsy of a right axillary node 2019-11-09 showed carcinoma  (b) CT chest/abd/pelvis 10/31/2019 shows 1.5 cm hypodense lesion in liver, lung nodules <0.3 cm  (c) bone scan 10/31/2019 negative  (d) liver MRI 11/11/2019 confirms a targetoid lesion in the left hepatic lobe suspicious for metastasis   (i) liver biopsy 2019-11-23 showed no evidence of malignancy (benign).  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 gene called c.1295T>G and one in the POLD1 gene called c.34G>A  (b) no additional mutations of concern were found in AIP, ALK, APC, ATM, AXIN2,BAP1,  BARD1, BLM, BMPR1A, BRCA1, BRCA2, BRIP1, CASR, CDC73, CDH1, CDK4, CDKN1B, CDKN1C, CDKN2A (p14ARF), CDKN2A (p16INK4a), CEBPA, CHEK2, CTNNA1, DICER1, DIS3L2, EGFR (c.2369C>T, p.Thr790Met variant only), EPCAM (Deletion/duplication testing only), FH, FLCN, GATA2, GPC3, GREM1 (Promoter region deletion/duplication testing only), HOXB13 (c.251G>A, p.Gly84Glu), HRAS, KIT, MAX, MEN1, MET, MITF (c.952G>A, p.Glu318Lys variant only), MLH1, MSH2, MSH3, MSH6, MUTYH, NBN, NF1, NF2, NTHL1, PALB2, PDGFRA, PHOX2B, PMS2, POLD1, POLE, POT1, PRKAR1A, PTCH1, PTEN, RAD50, RAD51C, RAD51D, RB1, RECQL4, RET, RNF43, RUNX1, SDHAF2, SDHA (sequence changes only), SDHB, SDHC, SDHD, SMAD4, SMARCA4, SMARCB1, SMARCE1, STK11, SUFU, TERC, TERT, TMEM127, TP53, TSC1, TSC2, VHL, WRN and WT1.  (2) neoadjuvant chemotherapy consisting of cyclophosphamide and doxorubicin in dose dense fashion x4 started 11/16/2019 and completed 12/27/2019, followed by paclitaxel and carboplatin weekly x12 starting 01/10/2020  (a) echo 10/24/2019 shows an ejection fraction in the 60-65% range  (b) patient opted to discontinue paclitaxel/carboplatin treatments after 6 doses, last dose 02/14/2020  (3) status post right lumpectomy 04/05/2020 for a residual ypT2 ypN1 invasive ductal carcinoma, grade 3, with negative margins.  (a) the single removed right axillary lymph node was positive  (b) completion axillary lymph node dissection planned for 05/10/2020  (4) adjuvant radiation to follow with capecitabine sensitization  (5) to continue capecitabine  to total 6 months  (6) pembrolizumab/Keytruda to start after capecitabine, continue for 1 year  (7) hemoglobin C trait:  (a) ferritin 10/17/2019 was 45 with saturation 25%, hemoglobin 11.8 and MCV 71.5  (b) hemoglobin electrophoresis 2019-11-22 showed: A: 62.6%, A2: 3.7%, C: 33.2%, F:0 5.1%, S: 0%   PLAN: Krista [name] has recovered remarkably well from her  surgery.  She understands that she does need further axillary exploration and that is already scheduled for 05/10/2020.  Given the significant residual disease after neoadjuvant chemotherapy and she will benefit from capecitabine.  We will start that at the time of radiation at sensitization doses, and then continue at full dose to complete a total of 6 months.  When she is done with the capecitabine she will start pembrolizumab and receive that for 1 year.  We discussed getting a port put back in but she can receive this through a peripheral vein and that is her preference.  She will not have the port replaced  Tentatively I have made her a return appointment with me for June 12, 2020 at 1PM.  We will do a Zoom at that point including her husband and son.  Total encounter time 35 minutes.Sarajane Jews C. Endiya Klahr, MD 04/17/20 10:12 AM Medical Oncology and Hematology [facility] [place], [place] [address] Tel. [phone]    Fax. [phone]   I, Wilburn Mylar, am acting as scribe for Dr. [name]. Kamerin Grumbine.  I, Lurline Del MD, have reviewed the above documentation for accuracy and completeness, and I agree with the above.   *Total Encounter Time as defined by the Centers for Medicare and Medicaid Services includes, in addition to the face-to-face time of a patient visit (documented in the note above) non-face-to-face time: obtaining and reviewing outside history, ordering and reviewing medications, tests or procedures, care coordination (communications with other health care professionals or  caregivers) and documentation in the medical record.

## 2020-04-17 ENCOUNTER — Telehealth: Payer: Self-pay | Admitting: Radiation Oncology

## 2020-04-17 ENCOUNTER — Inpatient Hospital Stay: Payer: BLUE CROSS/BLUE SHIELD | Attending: Oncology

## 2020-04-17 ENCOUNTER — Encounter: Payer: Self-pay | Admitting: Oncology

## 2020-04-17 ENCOUNTER — Other Ambulatory Visit: Payer: Self-pay

## 2020-04-17 ENCOUNTER — Inpatient Hospital Stay (HOSPITAL_BASED_OUTPATIENT_CLINIC_OR_DEPARTMENT_OTHER): Payer: BLUE CROSS/BLUE SHIELD | Admitting: Oncology

## 2020-04-17 ENCOUNTER — Other Ambulatory Visit: Payer: Self-pay | Admitting: Oncology

## 2020-04-17 ENCOUNTER — Encounter: Payer: Self-pay | Admitting: *Deleted

## 2020-04-17 VITALS — BP 144/93 | HR 86 | Temp 97.0°F | Resp 86 | Ht 67.0 in | Wt 206.6 lb

## 2020-04-17 DIAGNOSIS — Z1501 Genetic susceptibility to malignant neoplasm of breast: Secondary | ICD-10-CM

## 2020-04-17 DIAGNOSIS — D563 Thalassemia minor: Secondary | ICD-10-CM

## 2020-04-17 DIAGNOSIS — Z803 Family history of malignant neoplasm of breast: Secondary | ICD-10-CM | POA: Insufficient documentation

## 2020-04-17 DIAGNOSIS — Z171 Estrogen receptor negative status [ER-]: Secondary | ICD-10-CM

## 2020-04-17 DIAGNOSIS — C773 Secondary and unspecified malignant neoplasm of axilla and upper limb lymph nodes: Secondary | ICD-10-CM | POA: Insufficient documentation

## 2020-04-17 DIAGNOSIS — C50911 Malignant neoplasm of unspecified site of right female breast: Secondary | ICD-10-CM | POA: Diagnosis not present

## 2020-04-17 DIAGNOSIS — Z7189 Other specified counseling: Secondary | ICD-10-CM | POA: Diagnosis not present

## 2020-04-17 DIAGNOSIS — R918 Other nonspecific abnormal finding of lung field: Secondary | ICD-10-CM | POA: Diagnosis not present

## 2020-04-17 DIAGNOSIS — D582 Other hemoglobinopathies: Secondary | ICD-10-CM | POA: Diagnosis not present

## 2020-04-17 DIAGNOSIS — C50211 Malignant neoplasm of upper-inner quadrant of right female breast: Secondary | ICD-10-CM

## 2020-04-17 DIAGNOSIS — K769 Liver disease, unspecified: Secondary | ICD-10-CM | POA: Diagnosis not present

## 2020-04-17 DIAGNOSIS — Z8049 Family history of malignant neoplasm of other genital organs: Secondary | ICD-10-CM | POA: Insufficient documentation

## 2020-04-17 DIAGNOSIS — C50411 Malignant neoplasm of upper-outer quadrant of right female breast: Secondary | ICD-10-CM

## 2020-04-17 LAB — COMPREHENSIVE METABOLIC PANEL
ALT: 15 U/L (ref 0–44)
AST: 21 U/L (ref 15–41)
Albumin: 3.8 g/dL (ref 3.5–5.0)
Alkaline Phosphatase: 70 U/L (ref 38–126)
Anion gap: 7 (ref 5–15)
BUN: 9 mg/dL (ref 6–20)
CO2: 30 mmol/L (ref 22–32)
Calcium: 9.8 mg/dL (ref 8.9–10.3)
Chloride: 105 mmol/L (ref 98–111)
Creatinine, Ser: 0.82 mg/dL (ref 0.44–1.00)
GFR, Estimated: >60 mL/min (ref >60)
Glucose, Bld: 119 mg/dL — ABNORMAL HIGH (ref 70–99)
Potassium: 3.7 mmol/L (ref 3.5–5.1)
Sodium: 142 mmol/L (ref 135–145)
Total Bilirubin: 0.8 mg/dL (ref 0.3–1.2)
Total Protein: 7 g/dL (ref 6.5–8.1)

## 2020-04-17 LAB — CBC WITH DIFFERENTIAL/PLATELET
Abs Immature Granulocytes: 0.01 10*3/uL (ref 0.00–0.07)
Basophils Absolute: 0.1 10*3/uL (ref 0.0–0.1)
Basophils Relative: 1 %
Eosinophils Absolute: 0.4 10*3/uL (ref 0.0–0.5)
Eosinophils Relative: 7 %
HCT: 31.7 % — ABNORMAL LOW (ref 36.0–46.0)
Hemoglobin: 11 g/dL — ABNORMAL LOW (ref 12.0–15.0)
Immature Granulocytes: 0 %
Lymphocytes Relative: 23 %
Lymphs Abs: 1.5 10*3/uL (ref 0.7–4.0)
MCH: 27.5 pg (ref 26.0–34.0)
MCHC: 34.7 g/dL (ref 30.0–36.0)
MCV: 79.3 fL — ABNORMAL LOW (ref 80.0–100.0)
Monocytes Absolute: 0.4 10*3/uL (ref 0.1–1.0)
Monocytes Relative: 7 %
Neutro Abs: 4.1 10*3/uL (ref 1.7–7.7)
Neutrophils Relative %: 62 %
Platelets: 223 10*3/uL (ref 150–400)
RBC: 4 MIL/uL (ref 3.87–5.11)
RDW: 14.5 % (ref 11.5–15.5)
WBC: 6.5 10*3/uL (ref 4.0–10.5)
nRBC: 0 % (ref 0.0–0.2)

## 2020-04-17 MED ORDER — CAPECITABINE 500 MG PO TABS: 1000.0000 mg | ORAL_TABLET | Freq: Two times a day (BID) | ORAL | 0 refills | Status: DC

## 2020-04-17 NOTE — Telephone Encounter (Signed)
Rec'd an in basket message from Bary Castilla, RN, to change pt's consult appointment with Dr. Isidore Moos to a later date due to surgery that will be on 11/11. I have left her a voicemail.

## 2020-04-18 ENCOUNTER — Telehealth: Payer: Self-pay | Admitting: Radiation Oncology

## 2020-04-18 ENCOUNTER — Telehealth: Payer: Self-pay

## 2020-04-18 ENCOUNTER — Telehealth: Payer: Self-pay | Admitting: Pharmacist

## 2020-04-18 NOTE — Telephone Encounter (Signed)
Tried reaching patient to r/s her radiation appointments. LVM.

## 2020-04-18 NOTE — Telephone Encounter (Signed)
Oral Oncology Patient Advocate Encounter  After completing a benefits investigation, prior authorization for Xeloda is not required at this time through [place].  Patient's copay is $3.    [facility] [facility] Phone [phone] Fax [phone] 04/18/2020 10:35 AM

## 2020-04-18 NOTE — Telephone Encounter (Signed)
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  Prescription dose and frequency assessed for appropriateness. Appropriate for therapy initiation in conjunction with radiation.  CBC w/ Diff and CMP from 04/18/20 assessed, labs OK for treatment initiation. HCG quantitative pregnancy test on 03/27/20 < 5 mIU/mL, negative.   Current medication list in Epic reviewed, no significant/relevant DDIs with Xeloda identified.  Evaluated chart and no patient barriers to medication adherence noted.   Prescription has been e-scribed to the [facility] for benefits analysis and approval.  Oral Oncology Clinic will continue to follow for insurance authorization, copayment issues, initial counseling and start date.  Leron Croak, PharmD, BCPS Hematology/Oncology Clinical Pharmacist [facility] [phone] 04/18/2020 10:53 AM

## 2020-04-30 ENCOUNTER — Ambulatory Visit: Payer: Self-pay | Admitting: Surgery

## 2020-04-30 NOTE — H&P (Signed)
Krista [name] Appointment: 04/30/2020 2:10 PM Location: [facility] Patient #: [id number] DOB: [birth year] Married / Language: English / Race: Black or African American Female  History of Present Illness [name] A. Marcela Alatorre MD; 04/30/2020 3:17 PM) Patient words: Patient returns after right breast lumpectomy with sentinel lymph node mapping for stage II right breast cancer post-neoadjuvant chemotherapy. 1 positive node was mapped positive. She is seeing medical oncology. The case was discussed and a multidisciplinary group and the consensus was for recommending right axillary lymph node dissection. We discussed that today as well as the pros and cons of long-term expectations of the procedure. Complications were also discussed. Alternatives to surgery were discussed. She has some numbness around the incision otherwise is doing okay      FINAL MICROSCOPIC DIAGNOSIS:  A. BREAST, RIGHT, LUMPECTOMY: - Invasive ductal carcinoma, grade 3 associated with fibrosis and histiocytic infiltrates. - Margins not involved. - Invasive carcinoma 0.4 cm from posterior and superior margins. - Biopsy site and biopsy clip. - See oncology table.  B. BREAST, RIGHT ADDITIONAL LATERAL MARGIN, EXCISION: - Benign breast tissue. - No carcinoma identified. - Final lateral margin free of tumor.  C. LYMPH NODE, RIGHT AXILLARY, DISSECTION: - Metastatic carcinoma involving one lymph node (1/1). - Metastatic nodule is 0.6 cm.  ONCOLOGY TABLE: INVASIVE CARCINOMA OF THE BREAST: STATUS POST NEOADJUVANT TREATMENT: Resection Procedure: Right breast lumpectomy with additional right lateral margin and right axillary sentinel lymph node. Specimen Laterality: Right breast. Tumor Size: 4 x 2.5 x 1.4 cm. Histologic Type: Ductal. Histologic Grade: Glandular (Acinar)/Tubular Differentiation: 3 Nuclear Pleomorphism: 2 Mitotic Rate: 3 Overall Grade: 3 Margins: Not involved by  carcinoma. Distance from closest margin (millimeters): 4 mm. Specify closest margin (required only if <39m): Posterior and superior. Regional Lymph Nodes: Number of Lymph Nodes Examined: 1 Number of Sentinel Nodes Examined (if applicable): 1 Number of Lymph Nodes with Macrometastases (>2 mm): 1 Number of Lymph Nodes with Micrometastases: 0 Number of Lymph Nodes with Isolated Tumor Cells (=0.2 mm or =200 cells): 0 Size of Largest Metastatic Deposit (millimeters): 6 mm Extranodal Extension: Not identified. Treatment Effect: Present, fibrosis and foamy histiocytes. Breast Prognostic Profile (pre-neoadjuvant case #: [id number] Estrogen Receptor: 0%, negative. Progesterone Receptor: 0%, negative. Her2: Negative with IHC. Ki-67: 80%. Will be repeated on the current case (Block #: A2) and the results reported separately. Residual Cancer Burden (RCB): Primary Tumor Bed: 40 mm x 25 mm Overall Cancer Cellularity: 10% Percentage of Cancer that is in Situ: 0 Number of Positive Lymph Nodes: 1 Diameter of Largest Lymph Node metastasis: 6 mm Residual Cancer Burden: 1.703 Residual Cancer Burden Class: RCB B-II Representative Tumor Block: A2, A3 and A4 Pathologic Stage Classification (pTNM, AJCC 8th Edition): ypT2, pN1a (v4.4.0.0)  GROSS DESCRIPTION: A: Specimen type: Received fresh and placed in formalin at 12:15 PM on 04/05/2020, labeled radioactive seed guided right breast lumpectomy Size: 8.1 cm medial to lateral by 6.0 cm superior to inferior by 3.0 cm anterior to posterior Orientation: The specimen is received inked as follows: Anterior- green, inferior- blue, lateral- orange, medial- yellow, posterior- black, superior- red. Localized area: Specimen is received with 3 pins on the anterior aspect of the specimen. 2 pins designating the radioactive seeds are located 3.5 cm apart along the medial to  lateral axis. 2 radioactive seeds are identified and removed per protocol. Cut surface: Yellow lobulated adipose tissue with tan-white fibrous tissue (approximately 20% fibrous). Identified extending along the medial to lateral axis of the specimen, there is a  4.0 x 2.5 x 1.4 cm tan-white to yellow, ill-defined, indurated area noted. Within the center of the area of interest a silver metallic ribbon-shaped biopsy clip is identified. Please note the patient is status post neoadjuvant therapy. Margins: Margins are inked as previously stated. The area of interest measures 0.2 cm to the anterior margin, 0.6 cm to the superior margin, 0.7 cm to the posterior margin, and 1.2 cm to the inferior margin. Prognostic indicators: Obtained from paraffin blocks if needed Block summary: 18 blocks submitted 1 = lateral margin, perpendicular 2-17 = area of interest, sequentially submitted from lateral to medial 18 = medial margin, perpendicular  B: The specimen is received fresh and placed in formalin at 1:25 PM on 04/05/2020, and labeled additional right breast margin lateral. The specimen consists of a 3.5 x 2.1 x 1.5 cm piece of tan-yellow fibroadipose tissue, the new lateral margin has been previously inked orange. The margin is reinked black at time of gross, and the specimen is serially sectioned to reveal yellow lobulated adipose tissue with tan-white fibrous tissue (approximately 20% fibrous). Fibrous tissue focally extends to the new inked margin. Specimen is entirely submitted in 7 cassettes.  C: The specimen is received fresh and consists of a portion of adipose tissue, with pins designating a radioactive seed and biopsy clip. Radioactive seed is identified and read per protocol. The possible lymph node is identified measuring 1.5 x 1.3 x 0.8 cm. Sectioning reveals a tan-gray possible lymph node, with a silver metallic ring-shaped biopsy clip. The specimen is entirely submitted  in 2 cassettes.Craig Staggers 04/06/2020)  Final Diagnosis performed by Claudette Laws, MD. Electronically signed 04/09/2020 Technical component performed at [facility]. [facility], [place] [address] [place], [place] [address]. Professional component performed at [facility], [place] [address] [place], [place] [address]. Immunohistochemistry Technical component (if applicable) was performed at [facility] [address], [place], [place], [place] [address]. IMMUNOHISTOCHEMISTRY DISCLAIMER (if applicable): Some of these immunohistochemical stains may have been developed and the performance characteristics determine by [facility]. Some may not have been cleared or approved by the U.S. Food and Drug Administration. The FDA has determined that such clearance or approval is not necessary. This test is used for clinical purposes. It should not be regarded as investigational or for research. This laboratory is certified under the [place] (CLIA-88) as qualified to perform high complexity clinical laboratory testing. The controls stained appropriately.  ADDENDUM:  PROGNOSTIC INDICATOR RESULTS:  Immunohistochemical and morphometric analysis performed manually  By immunohistochemistry, HER-2 is EQUIVOCAL (2+). HER-2 by FISH is pending and will be reported in an addendum.  Estrogen Receptor: NEGATIVE Progesterone Receptor: NEGATIVE Proliferation Marker Ki-67: 50%  Comment: The negative hormone receptor study(ies) in the case lacks an internal positive control.  Reference Range Estrogen and Progesterone Receptor Negative 0% Positive >1%.  The patient is a 44 year old female.   Allergies (Chanel Teressa Senter, CMA; 04/30/2020 2:21 PM) [facility] *ANTIEMETICS* No Known Allergies [04/30/2020]: (Marked as Inactive) No Known Drug Allergies [04/30/2020]: (Marked as Inactive) Allergies  Reconciled  Medication History ([place], CMA; 04/30/2020 2:21 PM) No Current Medications Medications Reconciled    Vitals (Chanel Nolan CMA; 04/30/2020 2:21 PM) 04/30/2020 2:21 PM Weight: 204.25 lb Height: 67in Body Surface Area: 2.04 m Body Mass Index: 31.99 kg/m  Temp.: 97.50F  Pulse: 50 (Regular)  BP: 130/76(Sitting, Left Arm, Standard)        Physical Exam (Melisha Eggleton A. Aster Eckrich MD; 04/30/2020  3:18 PM)  Breast Note: CDI incision right breast right axilla CDI    Assessment & Plan (Cristol Engdahl A. Massie Cogliano MD; 04/30/2020 3:18 PM)  POST-OPERATIVE STATE [phone]) Impression: Patient scheduled for completion right axillary lymph node dissection. Risks of bleeding, infection, nerve injury, blood vessel injury, lymphedema, numbness, paresthesias, decreased shoulder range of motion in the further therapies or surgery were physical therapy discussed.  Current Plans Pt Education - CCS Free Text Education/Instructions: discussed with patient and provided information.

## 2020-04-30 NOTE — H&P (View-Only) (Signed)
Krista [name] Appointment: 04/30/2020 2:10 PM Location: [facility] Patient #: [id number] DOB: [birth year] Married / Language: English / Race: Black or African American Female  History of Present Illness Krista Moores A. Deforest Maiden MD; 04/30/2020 3:17 PM) Patient words: Patient returns after right breast lumpectomy with sentinel lymph node mapping for stage II right breast cancer post-neoadjuvant chemotherapy. 1 positive node was mapped positive. She is seeing medical oncology. The case was discussed and a multidisciplinary group and the consensus was for recommending right axillary lymph node dissection. We discussed that today as well as the pros and cons of long-term expectations of the procedure. Complications were also discussed. Alternatives to surgery were discussed. She has some numbness around the incision otherwise is doing okay      FINAL MICROSCOPIC DIAGNOSIS:  A. BREAST, RIGHT, LUMPECTOMY: - Invasive ductal carcinoma, grade 3 associated with fibrosis and histiocytic infiltrates. - Margins not involved. - Invasive carcinoma 0.4 cm from posterior and superior margins. - Biopsy site and biopsy clip. - See oncology table.  B. BREAST, RIGHT ADDITIONAL LATERAL MARGIN, EXCISION: - Benign breast tissue. - No carcinoma identified. - Final lateral margin free of tumor.  C. LYMPH NODE, RIGHT AXILLARY, DISSECTION: - Metastatic carcinoma involving one lymph node (1/1). - Metastatic nodule is 0.6 cm.  ONCOLOGY TABLE: INVASIVE CARCINOMA OF THE BREAST: STATUS POST NEOADJUVANT TREATMENT: Resection Procedure: Right breast lumpectomy with additional right lateral margin and right axillary sentinel lymph node. Specimen Laterality: Right breast. Tumor Size: 4 x 2.5 x 1.4 cm. Histologic Type: Ductal. Histologic Grade: Glandular (Acinar)/Tubular Differentiation: 3 Nuclear Pleomorphism: 2 Mitotic Rate: 3 Overall Grade: 3 Margins: Not involved by  carcinoma. Distance from closest margin (millimeters): 4 mm. Specify closest margin (required only if <39m): Posterior and superior. Regional Lymph Nodes: Number of Lymph Nodes Examined: 1 Number of Sentinel Nodes Examined (if applicable): 1 Number of Lymph Nodes with Macrometastases (>2 mm): 1 Number of Lymph Nodes with Micrometastases: 0 Number of Lymph Nodes with Isolated Tumor Cells (=0.2 mm or =200 cells): 0 Size of Largest Metastatic Deposit (millimeters): 6 mm Extranodal Extension: Not identified. Treatment Effect: Present, fibrosis and foamy histiocytes. Breast Prognostic Profile (pre-neoadjuvant case #: [id number] Estrogen Receptor: 0%, negative. Progesterone Receptor: 0%, negative. Her2: Negative with IHC. Ki-67: 80%. Will be repeated on the current case (Block #: A2) and the results reported separately. Residual Cancer Burden (RCB): Primary Tumor Bed: 40 mm x 25 mm Overall Cancer Cellularity: 10% Percentage of Cancer that is in Situ: 0 Number of Positive Lymph Nodes: 1 Diameter of Largest Lymph Node metastasis: 6 mm Residual Cancer Burden: 1.703 Residual Cancer Burden Class: RCB B-II Representative Tumor Block: A2, A3 and A4 Pathologic Stage Classification (pTNM, AJCC 8th Edition): ypT2, pN1a (v4.4.0.0)  GROSS DESCRIPTION: A: Specimen type: Received fresh and placed in formalin at 12:15 PM on 04/05/2020, labeled radioactive seed guided right breast lumpectomy Size: 8.1 cm medial to lateral by 6.0 cm superior to inferior by 3.0 cm anterior to posterior Orientation: The specimen is received inked as follows: Anterior- green, inferior- blue, lateral- orange, medial- yellow, posterior- black, superior- red. Localized area: Specimen is received with 3 pins on the anterior aspect of the specimen. 2 pins designating the radioactive seeds are located 3.5 cm apart along the medial to  lateral axis. 2 radioactive seeds are identified and removed per protocol. Cut surface: Yellow lobulated adipose tissue with tan-white fibrous tissue (approximately 20% fibrous). Identified extending along the medial to lateral axis of the specimen, there is a  4.0 x 2.5 x 1.4 cm tan-white to yellow, ill-defined, indurated area noted. Within the center of the area of interest a silver metallic ribbon-shaped biopsy clip is identified. Please note the patient is status post neoadjuvant therapy. Margins: Margins are inked as previously stated. The area of interest measures 0.2 cm to the anterior margin, 0.6 cm to the superior margin, 0.7 cm to the posterior margin, and 1.2 cm to the inferior margin. Prognostic indicators: Obtained from paraffin blocks if needed Block summary: 18 blocks submitted 1 = lateral margin, perpendicular 2-17 = area of interest, sequentially submitted from lateral to medial 18 = medial margin, perpendicular  B: The specimen is received fresh and placed in formalin at 1:25 PM on 04/05/2020, and labeled additional right breast margin lateral. The specimen consists of a 3.5 x 2.1 x 1.5 cm piece of tan-yellow fibroadipose tissue, the new lateral margin has been previously inked orange. The margin is reinked black at time of gross, and the specimen is serially sectioned to reveal yellow lobulated adipose tissue with tan-white fibrous tissue (approximately 20% fibrous). Fibrous tissue focally extends to the new inked margin. Specimen is entirely submitted in 7 cassettes.  C: The specimen is received fresh and consists of a portion of adipose tissue, with pins designating a radioactive seed and biopsy clip. Radioactive seed is identified and read per protocol. The possible lymph node is identified measuring 1.5 x 1.3 x 0.8 cm. Sectioning reveals a tan-gray possible lymph node, with a silver metallic ring-shaped biopsy clip. The specimen is entirely submitted  in 2 cassettes.Krista [name] 04/06/2020)  Final Diagnosis performed by Krista Laws, MD. Electronically signed 04/09/2020 Technical component performed at [facility]. [facility], [place] [address] [place], [place] [address]. Professional component performed at [facility], [place] [address], [place], [place] [address]. Immunohistochemistry Technical component (if applicable) was performed at [facility]. [address] [place], [place], [place] [address]. IMMUNOHISTOCHEMISTRY DISCLAIMER (if applicable): Some of these immunohistochemical stains may have been developed and the performance characteristics determine by [facility]. Some may not have been cleared or approved by the U.S. Food and Drug Administration. The FDA has determined that such clearance or approval is not necessary. This test is used for clinical purposes. It should not be regarded as investigational or for research. This laboratory is certified under the [place] (CLIA-88) as qualified to perform high complexity clinical laboratory testing. The controls stained appropriately.  ADDENDUM:  PROGNOSTIC INDICATOR RESULTS:  Immunohistochemical and morphometric analysis performed manually  By immunohistochemistry, HER-2 is EQUIVOCAL (2+). HER-2 by FISH is pending and will be reported in an addendum.  Estrogen Receptor: NEGATIVE Progesterone Receptor: NEGATIVE Proliferation Marker Ki-67: 50%  Comment: The negative hormone receptor study(ies) in the case lacks an internal positive control.  Reference Range Estrogen and Progesterone Receptor Negative 0% Positive >1%.  The patient is a 44 year old female.   Allergies (Krista [name], CMA; 04/30/2020 2:21 PM) [facility] *ANTIEMETICS* No Known Allergies [04/30/2020]: (Marked as Inactive) No Known Drug Allergies [04/30/2020]: (Marked as Inactive) Allergies  Reconciled  Medication History (Krista [name], CMA; 04/30/2020 2:21 PM) No Current Medications Medications Reconciled    Vitals (Krista [name] CMA; 04/30/2020 2:21 PM) 04/30/2020 2:21 PM Weight: 204.25 lb Height: 67in Body Surface Area: 2.04 m Body Mass Index: 31.99 kg/m  Temp.: 97.90F  Pulse: 50 (Regular)  BP: 130/76(Sitting, Left Arm, Standard)        Physical Exam (Krista Dauphinee A. Danuel Felicetti MD; 04/30/2020  3:18 PM)  Breast Note: CDI incision right breast right axilla CDI    Assessment & Plan (Krista Agena A. Kayce Chismar MD; 04/30/2020 3:18 PM)  POST-OPERATIVE STATE [phone]) Impression: Patient scheduled for completion right axillary lymph node dissection. Risks of bleeding, infection, nerve injury, blood vessel injury, lymphedema, numbness, paresthesias, decreased shoulder range of motion in the further therapies or surgery were physical therapy discussed.  Current Plans Pt Education - CCS Free Text Education/Instructions: discussed with patient and provided information.

## 2020-05-02 ENCOUNTER — Ambulatory Visit: Payer: Medicaid Other | Admitting: Radiation Oncology

## 2020-05-04 ENCOUNTER — Ambulatory Visit: Payer: Medicaid Other | Admitting: Radiation Oncology

## 2020-05-04 ENCOUNTER — Encounter (HOSPITAL_BASED_OUTPATIENT_CLINIC_OR_DEPARTMENT_OTHER): Payer: Self-pay | Admitting: Surgery

## 2020-05-04 ENCOUNTER — Other Ambulatory Visit: Payer: Self-pay

## 2020-05-06 LAB — SURGICAL PATHOLOGY

## 2020-05-07 ENCOUNTER — Other Ambulatory Visit (HOSPITAL_COMMUNITY)
Admission: RE | Admit: 2020-05-07 | Discharge: 2020-05-07 | Disposition: A | Discharge status: Home / Self Care | Payer: Medicaid Other | Source: Ambulatory Visit | Attending: Surgery | Admitting: Surgery

## 2020-05-07 ENCOUNTER — Other Ambulatory Visit: Payer: Self-pay | Admitting: Oncology

## 2020-05-07 DIAGNOSIS — Z01812 Encounter for preprocedural laboratory examination: Secondary | ICD-10-CM | POA: Diagnosis present

## 2020-05-07 DIAGNOSIS — Z20822 Contact with and (suspected) exposure to covid-19: Secondary | ICD-10-CM | POA: Diagnosis not present

## 2020-05-07 LAB — SARS CORONAVIRUS 2 (TAT 6-24 HRS): SARS Coronavirus 2: NEGATIVE

## 2020-05-07 NOTE — Progress Notes (Signed)
Children's emails are  [email or web address]   And  [email or web address]

## 2020-05-07 NOTE — Progress Notes (Signed)
Caris request successfully faxed.

## 2020-05-09 NOTE — Anesthesia Preprocedure Evaluation (Addendum)
Anesthesia Evaluation  Patient identified by MRN, date of birth, ID band Patient awake    Reviewed: Allergy & Precautions, NPO status , Patient's Chart, lab work & pertinent test results  Airway Mallampati: II  TM Distance: >3 FB Neck ROM: Full    Dental no notable dental hx. (+) Teeth Intact, Dental Advisory Given   Pulmonary neg pulmonary ROS,    Pulmonary exam normal breath sounds clear to auscultation       Cardiovascular Exercise Tolerance: Good negative cardio ROS Normal cardiovascular exam Rhythm:Regular Rate:Normal     Neuro/Psych negative neurological ROS  negative psych ROS   GI/Hepatic negative GI ROS, Neg liver ROS,   Endo/Other  negative endocrine ROS  Renal/GU negative Renal ROS     Musculoskeletal negative musculoskeletal ROS (+)   Abdominal (+) + obese,   Peds  Hematology  (+) Blood dyscrasia, ,   Anesthesia Other Findings   Reproductive/Obstetrics                            Anesthesia Physical Anesthesia Plan  ASA: II  Anesthesia Plan: General   Post-op Pain Management:  Regional for Post-op pain   Induction: Intravenous  PONV Risk Score and Plan: 3 and Treatment may vary due to age or medical condition, Midazolam, Ondansetron and Dexamethasone  Airway Management Planned: LMA  Additional Equipment: None  Intra-op Plan:   Post-operative Plan:   Informed Consent: I have reviewed the patients History and Physical, chart, labs and discussed the procedure including the risks, benefits and alternatives for the proposed anesthesia with the patient or authorized representative who has indicated his/her understanding and acceptance.     Dental advisory given  Plan Discussed with: CRNA and Anesthesiologist  Anesthesia Plan Comments: (GA w Pecs 2)       Anesthesia Quick Evaluation

## 2020-05-09 NOTE — Progress Notes (Signed)

## 2020-05-10 ENCOUNTER — Ambulatory Visit (HOSPITAL_BASED_OUTPATIENT_CLINIC_OR_DEPARTMENT_OTHER): Payer: BLUE CROSS/BLUE SHIELD | Admitting: Anesthesiology

## 2020-05-10 ENCOUNTER — Ambulatory Visit (HOSPITAL_BASED_OUTPATIENT_CLINIC_OR_DEPARTMENT_OTHER)
Admission: RE | Admit: 2020-05-10 | Discharge: 2020-05-10 | Disposition: A | Discharge status: Home / Self Care | Payer: BLUE CROSS/BLUE SHIELD | Attending: Surgery | Admitting: Surgery

## 2020-05-10 ENCOUNTER — Encounter (HOSPITAL_BASED_OUTPATIENT_CLINIC_OR_DEPARTMENT_OTHER): Payer: Self-pay | Admitting: Surgery

## 2020-05-10 ENCOUNTER — Encounter (HOSPITAL_BASED_OUTPATIENT_CLINIC_OR_DEPARTMENT_OTHER)
Admission: RE | Disposition: A | Discharge status: Home / Self Care | Payer: Self-pay | Source: Home / Self Care | Attending: Surgery

## 2020-05-10 DIAGNOSIS — C50911 Malignant neoplasm of unspecified site of right female breast: Secondary | ICD-10-CM | POA: Diagnosis present

## 2020-05-10 DIAGNOSIS — C773 Secondary and unspecified malignant neoplasm of axilla and upper limb lymph nodes: Secondary | ICD-10-CM | POA: Diagnosis not present

## 2020-05-10 DIAGNOSIS — Z888 Allergy status to other drugs, medicaments and biological substances status: Secondary | ICD-10-CM | POA: Insufficient documentation

## 2020-05-10 DIAGNOSIS — Z171 Estrogen receptor negative status [ER-]: Secondary | ICD-10-CM | POA: Insufficient documentation

## 2020-05-10 HISTORY — PX: AXILLARY LYMPH NODE DISSECTION: SHX5229

## 2020-05-10 SURGERY — LYMPHADENECTOMY, AXILLARY
Anesthesia: General | Site: Axilla | Laterality: Right

## 2020-05-10 MED ORDER — ROPIVACAINE HCL 5 MG/ML IJ SOLN
INTRAMUSCULAR | Status: DC | PRN
  Administered 2020-05-10: 30 mL

## 2020-05-10 MED ORDER — PROPOFOL 10 MG/ML IV BOLUS
INTRAVENOUS | Status: DC | PRN
  Administered 2020-05-10: 160 mg via INTRAVENOUS

## 2020-05-10 MED ORDER — FENTANYL CITRATE (PF) 100 MCG/2ML IJ SOLN
INTRAMUSCULAR | Status: AC
  Filled 2020-05-10: qty 2

## 2020-05-10 MED ORDER — BUPIVACAINE HCL 0.25 % IJ SOLN
INTRAMUSCULAR | Status: DC | PRN
  Administered 2020-05-10: 10 mL

## 2020-05-10 MED ORDER — FENTANYL CITRATE (PF) 100 MCG/2ML IJ SOLN
INTRAMUSCULAR | Status: DC | PRN
  Administered 2020-05-10: 50 ug via INTRAVENOUS

## 2020-05-10 MED ORDER — ONDANSETRON HCL 4 MG/2ML IJ SOLN
INTRAMUSCULAR | Status: DC | PRN
  Administered 2020-05-10: 4 mg via INTRAVENOUS

## 2020-05-10 MED ORDER — OXYCODONE HCL 5 MG PO TABS
5.0000 mg | ORAL_TABLET | Freq: Once | ORAL | Status: AC | PRN
  Administered 2020-05-10: 5 mg via ORAL

## 2020-05-10 MED ORDER — ONDANSETRON HCL 4 MG/2ML IJ SOLN
INTRAMUSCULAR | Status: AC
  Filled 2020-05-10: qty 2

## 2020-05-10 MED ORDER — LACTATED RINGERS IV SOLN: INTRAVENOUS | Status: DC

## 2020-05-10 MED ORDER — PROPOFOL 10 MG/ML IV BOLUS
INTRAVENOUS | Status: AC
  Filled 2020-05-10: qty 20

## 2020-05-10 MED ORDER — IBUPROFEN 800 MG PO TABS: 800.0000 mg | ORAL_TABLET | Freq: Three times a day (TID) | ORAL | 0 refills | Status: DC | PRN

## 2020-05-10 MED ORDER — AMISULPRIDE (ANTIEMETIC) 5 MG/2ML IV SOLN: 10.0000 mg | Freq: Once | INTRAVENOUS | Status: DC | PRN

## 2020-05-10 MED ORDER — OXYCODONE HCL 5 MG PO TABS: 5.0000 mg | ORAL_TABLET | Freq: Four times a day (QID) | ORAL | 0 refills | Status: DC | PRN

## 2020-05-10 MED ORDER — DEXAMETHASONE SODIUM PHOSPHATE 10 MG/ML IJ SOLN
INTRAMUSCULAR | Status: DC | PRN
  Administered 2020-05-10: 10 mg via INTRAVENOUS

## 2020-05-10 MED ORDER — LIDOCAINE HCL (CARDIAC) PF 100 MG/5ML IV SOSY
PREFILLED_SYRINGE | INTRAVENOUS | Status: DC | PRN
  Administered 2020-05-10: 60 mg via INTRATRACHEAL

## 2020-05-10 MED ORDER — ACETAMINOPHEN 10 MG/ML IV SOLN: 1000.0000 mg | Freq: Once | INTRAVENOUS | Status: DC | PRN

## 2020-05-10 MED ORDER — CELECOXIB 200 MG PO CAPS
200.0000 mg | ORAL_CAPSULE | ORAL | Status: AC
  Administered 2020-05-10: 200 mg via ORAL

## 2020-05-10 MED ORDER — MEPERIDINE HCL 25 MG/ML IJ SOLN: 6.2500 mg | INTRAMUSCULAR | Status: DC | PRN

## 2020-05-10 MED ORDER — CHLORHEXIDINE GLUCONATE CLOTH 2 % EX PADS: 6.0000 | MEDICATED_PAD | Freq: Once | CUTANEOUS | Status: DC

## 2020-05-10 MED ORDER — OXYCODONE HCL 5 MG/5ML PO SOLN: 5.0000 mg | Freq: Once | ORAL | Status: AC | PRN

## 2020-05-10 MED ORDER — MIDAZOLAM HCL 2 MG/2ML IJ SOLN
INTRAMUSCULAR | Status: AC
  Filled 2020-05-10: qty 2

## 2020-05-10 MED ORDER — ACETAMINOPHEN 500 MG PO TABS
1000.0000 mg | ORAL_TABLET | ORAL | Status: AC
  Administered 2020-05-10: 1000 mg via ORAL

## 2020-05-10 MED ORDER — CELECOXIB 200 MG PO CAPS
ORAL_CAPSULE | ORAL | Status: AC
  Filled 2020-05-10: qty 1

## 2020-05-10 MED ORDER — CEFAZOLIN SODIUM-DEXTROSE 2-4 GM/100ML-% IV SOLN
INTRAVENOUS | Status: AC
  Filled 2020-05-10: qty 100

## 2020-05-10 MED ORDER — HYDROMORPHONE HCL 1 MG/ML IJ SOLN: 0.2500 mg | INTRAMUSCULAR | Status: DC | PRN

## 2020-05-10 MED ORDER — LIDOCAINE 2% (20 MG/ML) 5 ML SYRINGE
INTRAMUSCULAR | Status: AC
  Filled 2020-05-10: qty 5

## 2020-05-10 MED ORDER — ONDANSETRON HCL 4 MG/2ML IJ SOLN: 4.0000 mg | Freq: Once | INTRAMUSCULAR | Status: DC | PRN

## 2020-05-10 MED ORDER — CEFAZOLIN SODIUM-DEXTROSE 2-4 GM/100ML-% IV SOLN
2.0000 g | INTRAVENOUS | Status: AC
  Administered 2020-05-10: 2 g via INTRAVENOUS

## 2020-05-10 MED ORDER — CLONIDINE HCL (ANALGESIA) 100 MCG/ML EP SOLN
EPIDURAL | Status: DC | PRN
  Administered 2020-05-10: 100 ug

## 2020-05-10 MED ORDER — GABAPENTIN 300 MG PO CAPS
ORAL_CAPSULE | ORAL | Status: AC
  Filled 2020-05-10: qty 1

## 2020-05-10 MED ORDER — OXYCODONE HCL 5 MG PO TABS
ORAL_TABLET | ORAL | Status: AC
  Filled 2020-05-10: qty 1

## 2020-05-10 MED ORDER — ACETAMINOPHEN 500 MG PO TABS
ORAL_TABLET | ORAL | Status: AC
  Filled 2020-05-10: qty 2

## 2020-05-10 MED ORDER — FENTANYL CITRATE (PF) 100 MCG/2ML IJ SOLN
100.0000 ug | Freq: Once | INTRAMUSCULAR | Status: AC
  Administered 2020-05-10: 100 ug via INTRAVENOUS

## 2020-05-10 MED ORDER — MIDAZOLAM HCL 2 MG/2ML IJ SOLN
2.0000 mg | Freq: Once | INTRAMUSCULAR | Status: AC
  Administered 2020-05-10: 2 mg via INTRAVENOUS

## 2020-05-10 MED ORDER — GABAPENTIN 300 MG PO CAPS
300.0000 mg | ORAL_CAPSULE | ORAL | Status: AC
  Administered 2020-05-10: 300 mg via ORAL

## 2020-05-10 SURGICAL SUPPLY — 50 items
ADH SKN CLS APL DERMABOND .7 (GAUZE/BANDAGES/DRESSINGS) ×1
APL PRP STRL LF DISP 70% ISPRP (MISCELLANEOUS) ×1
APPLIER CLIP 11 MED OPEN (CLIP) ×2
APPLIER CLIP 9.375 MED OPEN (MISCELLANEOUS) ×4
APR CLP MED 11 20 MLT OPN (CLIP) ×1
APR CLP MED 9.3 20 MLT OPN (MISCELLANEOUS) ×2
BINDER BREAST XLRG (GAUZE/BANDAGES/DRESSINGS) ×2 IMPLANT
BIOPATCH RED 1 DISK 7.0 (GAUZE/BANDAGES/DRESSINGS) ×2 IMPLANT
BLADE SURG 15 STRL LF DISP TIS (BLADE) ×1 IMPLANT
BLADE SURG 15 STRL SS (BLADE) ×2
CANISTER SUCT 1200ML W/VALVE (MISCELLANEOUS) ×2 IMPLANT
CHLORAPREP W/TINT 26 (MISCELLANEOUS) ×2 IMPLANT
CLIP APPLIE 11 MED OPEN (CLIP) ×1 IMPLANT
CLIP APPLIE 9.375 MED OPEN (MISCELLANEOUS) ×2 IMPLANT
COVER BACK TABLE 60X90IN (DRAPES) ×2 IMPLANT
COVER MAYO STAND STRL (DRAPES) ×2 IMPLANT
DERMABOND ADVANCED (GAUZE/BANDAGES/DRESSINGS) ×1
DERMABOND ADVANCED .7 DNX12 (GAUZE/BANDAGES/DRESSINGS) ×1 IMPLANT
DRAIN CHANNEL 19F RND (DRAIN) ×2 IMPLANT
DRAPE LAPAROSCOPIC ABDOMINAL (DRAPES) ×2 IMPLANT
DRAPE UTILITY XL STRL (DRAPES) ×2 IMPLANT
ELECT COATED BLADE 2.86 ST (ELECTRODE) ×2 IMPLANT
ELECT REM PT RETURN 9FT ADLT (ELECTROSURGICAL) ×2
ELECTRODE REM PT RTRN 9FT ADLT (ELECTROSURGICAL) ×1 IMPLANT
EVACUATOR SILICONE 100CC (DRAIN) ×2 IMPLANT
GAUZE SPONGE 4X4 12PLY STRL (GAUZE/BANDAGES/DRESSINGS) IMPLANT
GLOVE BIO SURGEON STRL SZ 6.5 (GLOVE) ×2 IMPLANT
GLOVE BIO SURGEON STRL SZ7.5 (GLOVE) ×2 IMPLANT
GLOVE BIOGEL PI IND STRL 7.5 (GLOVE) ×1 IMPLANT
GLOVE BIOGEL PI IND STRL 8 (GLOVE) ×1 IMPLANT
GLOVE BIOGEL PI INDICATOR 7.5 (GLOVE) ×1
GLOVE BIOGEL PI INDICATOR 8 (GLOVE) ×1
GLOVE ECLIPSE 8.0 STRL XLNG CF (GLOVE) ×2 IMPLANT
GOWN STRL REUS W/ TWL LRG LVL3 (GOWN DISPOSABLE) ×3 IMPLANT
GOWN STRL REUS W/TWL LRG LVL3 (GOWN DISPOSABLE) ×6
HEMOSTAT ARISTA ABSORB 3G PWDR (HEMOSTASIS) ×2 IMPLANT
NEEDLE HYPO 25X1 1.5 SAFETY (NEEDLE) ×2 IMPLANT
NS IRRIG 1000ML POUR BTL (IV SOLUTION) ×2 IMPLANT
PACK BASIN DAY SURGERY FS (CUSTOM PROCEDURE TRAY) ×2 IMPLANT
PENCIL SMOKE EVACUATOR (MISCELLANEOUS) ×2 IMPLANT
PIN SAFETY STERILE (MISCELLANEOUS) ×2 IMPLANT
SLEEVE SCD COMPRESS KNEE MED (MISCELLANEOUS) ×2 IMPLANT
SPONGE LAP 4X18 RFD (DISPOSABLE) ×2 IMPLANT
SUT ETHILON 3 0 PS 1 (SUTURE) ×2 IMPLANT
SUT MNCRL AB 4-0 PS2 18 (SUTURE) ×2 IMPLANT
SUT VICRYL 3-0 CR8 SH (SUTURE) ×2 IMPLANT
SYR CONTROL 10ML LL (SYRINGE) ×2 IMPLANT
TOWEL GREEN STERILE FF (TOWEL DISPOSABLE) ×2 IMPLANT
TUBE CONNECTING 20X1/4 (TUBING) ×2 IMPLANT
YANKAUER SUCT BULB TIP NO VENT (SUCTIONS) ×2 IMPLANT

## 2020-05-10 NOTE — Transfer of Care (Signed)
Immediate Anesthesia Transfer of Care Note  Patient: Krista [name]  Procedure(s) Performed: RIGHT AXILLARY LYMPH NODE DISSECTION (Right Axilla)  Patient Location: PACU  Anesthesia Type:General  Level of Consciousness: awake  Airway & Oxygen Therapy: Patient Spontanous Breathing and Patient connected to face mask oxygen  Post-op Assessment: Report given to RN, Post -op Vital signs reviewed and stable and Patient moving all extremities X 4  Post vital signs: Reviewed and stable  Last Vitals:  Vitals Value Taken Time  BP    Temp    Pulse 84 05/10/20 1224  Resp 12 05/10/20 1224  SpO2 100 % 05/10/20 1224  Vitals shown include unvalidated device data.  Last Pain:  Vitals:   05/10/20 1004  TempSrc: Oral  PainSc: 0-No pain      Patients Stated Pain Goal: 3 (54/65/68 1275)  Complications: No complications documented.

## 2020-05-10 NOTE — Op Note (Signed)
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  Of note she had a positive node in the right axilla after neoadjuvant chemotherapy.  We discussed options of radiation therapy versus surgery versus combination of both as well as outcome data to make her decision.  This was reviewed by the multidisciplinary clinic and medical oncology as well.  We felt that right axillary lymph node dissection would be appropriate given her continued positivity of the right axilla and her young age at this point time.  We discussed lymphedema risk which will be up around over 50% in her case.  We discussed postop morbidity and then discussed potential recurrence rates without do the node dissection in the circumstance.  This was weighed heavily after lengthy discussion with the patient and her other doctors we felt right axillary lymph node dissection the appropriate next.  Risk of bleeding, infection, nerve injury, blood vessel injury, seroma, shoulder stiffness, arm numbness, lymphedema, and anesthesia risk as well as cardiovascular events discussed.  She agreed to proceed.  Description of procedure: The patient was met in the holding area and questions were answered.  The right axilla was marked as the correct site.  She was then taken back to the operating.  She is placed supine upon the OR table.  Of note anesthesia placed a pectoral block.  After induction of general anesthesia, she was prepped and draped in sterile fashion timeout performed.  The old incision was opened and  seroma evacuated.  This was widened about a centimeter in each direction.  Dissection was carried down into the level 1 contents.  Contents were skeletonized away from the long thoracic nerve, the thoracodorsal trunk and the axillary vein.  All lymphovascular tissue between exam arch was removed.  These nodes were sent to pathology for further evaluation.  Level 2 level 3 regions were palpated I did not feel any abnormality at this point time.  Wound was irrigated.  Arista was placed.  Through a separate stab incision a 19 round drain was placed.  Incision was closed with 3-0 Vicryl and 4-0 Monocryl.  Dermabond applied.  All counts found to be correct.  The patient was awoke extubated taken to recovery in satisfactory condition.

## 2020-05-10 NOTE — Anesthesia Procedure Notes (Signed)
Procedure Name: LMA Insertion Date/Time: 05/10/2020 11:24 AM Performed by: Glory Buff, CRNA Pre-anesthesia Checklist: Patient identified, Emergency Drugs available, Suction available and Patient being monitored Patient Re-evaluated:Patient Re-evaluated prior to induction Oxygen Delivery Method: Circle system utilized Preoxygenation: Pre-oxygenation with 100% oxygen Induction Type: IV induction LMA: LMA inserted LMA Size: 4.0 Number of attempts: 1 Placement Confirmation: positive ETCO2 Tube secured with: Tape Dental Injury: Teeth and Oropharynx as per pre-operative assessment

## 2020-05-10 NOTE — Discharge Instructions (Signed)
[facility] Surgical drains are used to remove extra fluid that normally builds up in a surgical wound after surgery. A surgical drain helps to heal a surgical wound. Different kinds of surgical drains include:  Active drains. These drains use suction to pull drainage away from the surgical wound. Drainage flows through a tube to a container outside of the body. With these drains, you need to keep the bulb or the drainage container flat (compressed) at all times, except while you empty it. Flattening the bulb or container creates suction.  Passive drains. These drains allow fluid to drain naturally, by gravity. Drainage flows through a tube to a bandage (dressing) or a container outside of the body. Passive drains do not need to be emptied. A drain is placed during surgery. Right after surgery, drainage is usually bright red and a little thicker than water. The drainage may gradually turn yellow or pink and become thinner. It is likely that your health care provider will remove the drain when the drainage stops or when the amount decreases to 1-2 Tbsp (15-30 mL) during a 24-hour period. Supplies needed:  Tape.  Germ-free cleaning solution (sterile saline).  Cotton swabs.  Split gauze drain sponge: 4 x 4 inches (10 x 10 cm).  Gauze square: 4 x 4 inches (10 x 10 cm). How to care for your surgical drain Care for your drain as told by your health care provider. This is important to help prevent infection. If your drain is placed at your back, or any other hard-to-reach area, ask another person to assist you in performing the following tasks: General care  Keep the skin around the drain dry and covered with a dressing at all times.  Check your drain area every day for signs of infection. Check for: ? Redness, swelling, or pain. ? Pus or a bad smell. ? Cloudy drainage. ? Tenderness or pressure at the drain exit site. Changing the dressing Follow instructions from your health care  provider about how to change your dressing. Change your dressing at least once a day. Change it more often if needed to keep the dressing dry. Make sure you: 1. Gather your supplies. 2. Wash your hands with soap and water before you change your dressing. If soap and water are not available, use hand sanitizer. 3. Remove the old dressing. Avoid using scissors to do that. 4. Wash your hands with soap and water again after removing the old dressing. 5. Use sterile saline to clean your skin around the drain. You may need to use a cotton swab to clean the skin. 6. Place the tube through the slit in a drain sponge. Place the drain sponge so that it covers your wound. 7. Place the gauze square or another drain sponge on top of the drain sponge that is on the wound. Make sure the tube is between those layers. 8. Tape the dressing to your skin. 9. Tape the drainage tube to your skin 1-2 inches (2.5-5 cm) below the place where the tube enters your body. Taping keeps the tube from pulling on any stitches (sutures) that you have. 10. Wash your hands with soap and water. 11. Write down the color of your drainage and how often you change your dressing. How to empty your active drain  1. Make sure that you have a measuring cup that you can empty your drainage into. 2. Wash your hands with soap and water. If soap and water are not available, use hand sanitizer. 3.  Loosen any pins or clips that hold the tube in place. 4. If your health care provider tells you to strip the tube to prevent clots and tube blockages: ? Hold the tube at the skin with one hand. Use your other hand to pinch the tubing with your thumb and first finger. ? Gently move your fingers down the tube while squeezing very lightly. This clears any drainage, clots, or tissue from the tube. ? You may need to do this several times each day to keep the tube clear. Do not pull on the tube. 5. Open the bulb cap or the drain plug. Do not touch the  inside of the cap or the bottom of the plug. 6. Turn the device upside down and gently squeeze. 7. Empty all of the drainage into the measuring cup. 8. Compress the bulb or the container and replace the cap or the plug. To compress the bulb or the container, squeeze it firmly in the middle while you close the cap or plug the container. 9. Write down the amount of drainage that you have in each 24-hour period. If you have less than 2 Tbsp (30 mL) of drainage during 24 hours, contact your health care provider. 10. Flush the drainage down the toilet. 11. Wash your hands with soap and water. Contact a health care provider if:  You have redness, swelling, or pain around your drain area.  You have pus or a bad smell coming from your drain area.  You have a fever or chills.  The skin around your drain is warm to the touch.  The amount of drainage that you have is increasing instead of decreasing.  You have drainage that is cloudy.  There is a sudden stop or a sudden decrease in the amount of drainage that you have.  Your drain tube falls out.  Your active drain does not stay compressed after you empty it. Summary  Surgical drains are used to remove extra fluid that normally builds up in a surgical wound after surgery.  Different kinds of surgical drains include active drains and passive drains. Active drains use suction to pull drainage away from the surgical wound, and passive drains allow fluid to drain naturally.  It is important to care for your drain to prevent infection. If your drain is placed at your back, or any other hard-to-reach area, ask another person to assist you.  Contact your health care provider if you have redness, swelling, or pain around your drain area. This information is not intended to replace advice given to you by your health care provider. Make sure you discuss any questions you have with your health care provider. Document Revised: 07/21/2018 Document  Reviewed: 07/21/2018 Elsevier Patient Education  2020 [place] Office Phone Number [phone]  BREAST BIOPSY/ PARTIAL MASTECTOMY: POST OP INSTRUCTIONS  Always review your discharge instruction sheet given to you by the facility where your surgery was performed.  IF YOU HAVE DISABILITY OR FAMILY LEAVE FORMS, YOU MUST BRING THEM TO THE OFFICE FOR PROCESSING.  DO NOT GIVE THEM TO YOUR DOCTOR.  1. A prescription for pain medication may be given to you upon discharge.  Take your pain medication as prescribed, if needed.  If narcotic pain medicine is not needed, then you may take acetaminophen (Tylenol) or ibuprofen (Advil) as needed. 2. Take your usually prescribed medications unless otherwise directed 3. If you need a refill on your pain medication, please contact your pharmacy.  They will contact our  office to request authorization.  Prescriptions will not be filled after 5pm or on week-ends. 4. You should eat very light the first 24 hours after surgery, such as soup, crackers, pudding, etc.  Resume your normal diet the day after surgery. 5. Most patients will experience some swelling and bruising in the breast.  Ice packs and a good support bra will help.  Swelling and bruising can take several days to resolve.  6. It is common to experience some constipation if taking pain medication after surgery.  Increasing fluid intake and taking a stool softener will usually help or prevent this problem from occurring.  A mild laxative (Milk of Magnesia or Miralax) should be taken according to package directions if there are no bowel movements after 48 hours. 7. Unless discharge instructions indicate otherwise, you may remove your bandages 24-48 hours after surgery, and you may shower at that time.  You may have steri-strips (small skin tapes) in place directly over the incision.  These strips should be left on the skin for 7-10 days.  If your surgeon used skin glue on the  incision, you may shower in 24 hours.  The glue will flake off over the next 2-3 weeks.  Any sutures or staples will be removed at the office during your follow-up visit. 8. ACTIVITIES:  You may resume regular daily activities (gradually increasing) beginning the next day.  Wearing a good support bra or sports bra minimizes pain and swelling.  You may have sexual intercourse when it is comfortable. a. You may drive when you no longer are taking prescription pain medication, you can comfortably wear a seatbelt, and you can safely maneuver your car and apply brakes. b. RETURN TO WORK:  ______________________________________________________________________________________ 9. You should see your doctor in the office for a follow-up appointment approximately two weeks after your surgery.  Your doctor's nurse will typically make your follow-up appointment when she calls you with your pathology report.  Expect your pathology report 2-3 business days after your surgery.  You may call to check if you do not hear from [place] after three days. 10. OTHER INSTRUCTIONS: _______________________________________________________________________________________________ _____________________________________________________________________________________________________________________________________ _____________________________________________________________________________________________________________________________________ _____________________________________________________________________________________________________________________________________  WHEN TO CALL YOUR DOCTOR: 1. Fever over 101.0 2. Nausea and/or vomiting. 3. Extreme swelling or bruising. 4. Continued bleeding from incision. 5. Increased pain, redness, or drainage from the incision.  The clinic staff is available to answer your questions during regular business hours.  Please don't hesitate to call and ask to speak to one of the nurses for  clinical concerns.  If you have a medical emergency, go to the nearest emergency room or call 911.  A surgeon from [facility] is always on call at the hospital.  For further questions, please visit [email or web address]    Post Anesthesia Home Care Instructions  Activity: Get plenty of rest for the remainder of the day. A responsible individual must stay with you for 24 hours following the procedure.  For the next 24 hours, DO NOT: -Drive a car -Paediatric nurse -Drink alcoholic beverages -Take any medication unless instructed by your physician -Make any legal decisions or sign important papers.  Meals: Start with liquid foods such as gelatin or soup. Progress to regular foods as tolerated. Avoid greasy, spicy, heavy foods. If nausea and/or vomiting occur, drink only clear liquids until the nausea and/or vomiting subsides. Call your physician if vomiting continues.  Special Instructions/Symptoms: Your throat may feel dry or sore from the anesthesia or the breathing tube placed in your throat during  surgery. If this causes discomfort, gargle with warm salt water. The discomfort should disappear within 24 hours.  If you had a scopolamine patch placed behind your ear for the management of post- operative nausea and/or vomiting:  1. The medication in the patch is effective for 72 hours, after which it should be removed.  Wrap patch in a tissue and discard in the trash. Wash hands thoroughly with soap and water. 2. You may remove the patch earlier than 72 hours if you experience unpleasant side effects which may include dry mouth, dizziness or visual disturbances. 3. Avoid touching the patch. Wash your hands with soap and water after contact with the patch.     May take Tylenol after 4:00pm as needed.

## 2020-05-10 NOTE — Progress Notes (Signed)
Assisted Dr. Houser with right, ultrasound guided, pectoralis block. Side rails up, monitors on throughout procedure. See vital signs in flow sheet. Tolerated Procedure well. 

## 2020-05-10 NOTE — Anesthesia Procedure Notes (Signed)
Anesthesia Regional Block: Pectoralis block   Pre-Anesthetic Checklist: ,, timeout performed, Correct Patient, Correct Site, Correct Laterality, Correct Procedure, Correct Position, site marked, Risks and benefits discussed,  Surgical consent,  Pre-op evaluation,  At surgeon's request and post-op pain management  Laterality: Right and Upper  Prep: chloraprep       Needles:  Injection technique: Single-shot  Needle Type: Echogenic Needle     Needle Length: 9cm  Needle Gauge: 21     Additional Needles:   Procedures:,,,, ultrasound used (permanent image in chart),,,,  Narrative:  Start time: 05/10/2020 10:53 AM End time: 05/10/2020 11:00 AM Injection made incrementally with aspirations every 5 mL.  Performed by: Personally  Anesthesiologist: Barnet Glasgow, MD  Additional Notes: Block assessed. Patient tolerated procedure well.

## 2020-05-10 NOTE — Interval H&P Note (Signed)
History and Physical Interval Note:  05/10/2020 11:00 AM  Krista [name]  has presented today for surgery, with the diagnosis of BREAST CANCER.  The various methods of treatment have been discussed with the patient and family. After consideration of risks, benefits and other options for treatment, the patient has consented to  Procedure(s): RIGHT AXILLARY LYMPH NODE DISSECTION (Right) as a surgical intervention.  The patient's history has been reviewed, patient examined, no change in status, stable for surgery.  I have reviewed the patient's chart and labs.  Questions were answered to the patient's satisfaction.     [place]

## 2020-05-10 NOTE — Anesthesia Postprocedure Evaluation (Signed)
Anesthesia Post Note  Patient: Krista [name]  Procedure(s) Performed: RIGHT AXILLARY LYMPH NODE DISSECTION (Right Axilla)     Patient location during evaluation: PACU Anesthesia Type: General Level of consciousness: awake and alert Pain management: pain level controlled Vital Signs Assessment: post-procedure vital signs reviewed and stable Respiratory status: spontaneous breathing, nonlabored ventilation, respiratory function stable and patient connected to nasal cannula oxygen Cardiovascular status: blood pressure returned to baseline and stable Postop Assessment: no apparent nausea or vomiting Anesthetic complications: no   No complications documented.  Last Vitals:  Vitals:   05/10/20 1328 05/10/20 1408  BP: 124/86 (!) 137/98  Pulse: 73 73  Resp: 17 14  Temp:  36.4 C  SpO2: 97% 94%    Last Pain:  Vitals:   05/10/20 1408  TempSrc:   PainSc: [place]

## 2020-05-11 ENCOUNTER — Encounter (HOSPITAL_BASED_OUTPATIENT_CLINIC_OR_DEPARTMENT_OTHER): Payer: Self-pay | Admitting: Surgery

## 2020-05-11 LAB — SURGICAL PATHOLOGY

## 2020-05-14 ENCOUNTER — Telehealth: Payer: Self-pay

## 2020-05-14 NOTE — Telephone Encounter (Signed)
Pt called stating she is having redness to lymph node dissection. This LPN advised pt to call [facility] to make Dr Brantley Stage aware. Pt verbalized thanks and understanding.

## 2020-05-15 ENCOUNTER — Encounter: Payer: Self-pay | Admitting: *Deleted

## 2020-05-28 ENCOUNTER — Encounter (HOSPITAL_COMMUNITY): Payer: Self-pay | Admitting: Oncology

## 2020-05-28 ENCOUNTER — Other Ambulatory Visit: Payer: Self-pay | Admitting: Oncology

## 2020-05-28 NOTE — Progress Notes (Signed)
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  We are going to proceed to pembrolizumab once she is done with radiation treatments.

## 2020-06-01 ENCOUNTER — Other Ambulatory Visit: Payer: Self-pay | Admitting: Oncology

## 2020-06-04 NOTE — Progress Notes (Signed)
Radiation Oncology         [phone]) [phone] ________________________________  Name: Krista [name] MRN: [id number]  Date: 06/05/2020  DOB: [birth year]  Follow-Up Visit Note by telemedicine to maximize safety during the pandemic.  MyChart video was used.  Outpatient  CC: Everett Graff, MD  Everett Graff, MD  Diagnosis:      ICD-10-CM   1. Malignant neoplasm of upper-inner quadrant of right breast in female, estrogen receptor negative ([place])  C50.211 Pregnancy, urine   Z17.1 CANCELED: Pregnancy, urine     Cancer Staging Malignant neoplasm of upper-inner quadrant of right breast in female, estrogen receptor negative ([place]) Staging form: Breast, AJCC 8th Edition - Clinical: Stage IIIB (cT3, cN0, cM0, G3, ER-, PR-, HER2-) - Signed by Chauncey Cruel, MD on 10/17/2019 - Pathologic stage from 06/05/2020: No Stage Recommended (ypT2, pN1a, cM0, G3, ER-, PR-, HER2-) - Signed by Eppie Gibson, MD on 06/06/2020   CHIEF COMPLAINT: Here to discuss management of right breast cancer  Narrative:  The patient returns today for follow-up. She was initially seen in consultation on 10/21/2019, during which time we discussed neoadjuvant chemotherapy followed by surgery and then radiation therapy.   Since consultation date, she underwent the following imaging (dates and results as follows):  1. MRI of bilateral breasts on 10/27/2019 showed the biopsy-proven malignancy in the upper inner right breast with small adjacent satellite masses along the inferior margin that measured up to 7.9 x 4.6 x 4.6 cm. There was also a suspicious linear oriented enhancement in the lower inner quadrant of the right breast slightly inferior to the dominant mass that measured up to 3.5 cm. Finally, there was a morphologically abnormal level I lymph node in the right axilla. There was no MRI evidence of malignancy in the left breast. 2. CT scan of chest/abdomen/pelvis on 10/31/2019 showed a large right breast mass with  metastatic right axillary adenopathy. There was also noted to be an ill-defined low-attenuation lesion in segment 4 of the liver that was worrisome for metastatic disease. Additionally, there was a lateral left breast for which the patient had undergone sonographic and MRI evaluation in March/April of this year. Finally, there was an enlarged, heterogeneous and fibroid uterus that could have been causing mild bilateral ureteral dilatation, left greater than right, in addition to tiny subpleural pulmonary nodules that were non-specific and were not felt to be metastatic. 3. Bone scan on 10/31/2019 did not show any scintigraphic evidence of osseous metastatic disease. 4. Ultrasound of right axilla on 11/09/2019 showed a suspicious low-lying right axillary lymph node that corresponded with the above MRI findings. 5. MRI of liver on 11/11/2019 showed a targetoid lesion in the medial segment of the left hepatic lobe that displayed restricted diffusion with target-like enhancement with features of metastatic disease. Additionally, there was a subtle focus of diffusion related signal along a venous branch in the lateral segment of the left hepatic lobe just above the inter lobar fissure. There was no corresponding abnormality on contrasted imaging. It could have represented slow flow in a venous branch in plane with the scan or a small focus of disease. Close attenuation on follow-up was suggested. There was no other evidence of metastatic disease within the abdomen. 6. MRI of bilateral breasts on 03/07/2020 showed marked positive imaging response to neoadjuvant chemotherapy. The right breast mass had significantly decreased with only residual subtle none mass enhancement persisting. The metastatic right axillary lymph node had also significantly decreased in size. There was no evidence of new right  breast malignancy, nor was there any evidence of left breast malignancy.  Right axillary nodal biopsy on 11/09/2019  showed metastatic carcinoma in one of one lymph node. Ultrasound-guided core biopsy of liver mass on 11/23/2019 was benign without malignancy.  Systemic therapy, if applicable, involved (dates and therapy as follows): Neoadjuvant chemotherapy consisting of Cyclophosphamide and Doxorubicin in dose dense fashion x4 from 11/16/2019 to 12/27/2019 followed by Paclitaxel and Carboplatin x12 starting on 01/10/2020. The patient opted to discontinue Paclitaxel/Carboplatin treatment after six doses (last dose was on 02/14/2020).  Breast/nodal surgery on the date of 04/05/2020 revealed: tumor size of 4.0 x 2.5 x 1.4 cm; histology of invasive ductal carcinoma with associated fibrosis and histiocytic infiltrates; margin status to invasive disease of 0.4 cm from posterior and superior margins; nodal status of positive for metastatic carcinoma in 1/1 right axillary lymph node; ER status: 0% negative; PR status: 0% negative; Her2 status: negative; Grade: 3.  Right axillary lymph node dissection on the date of 05/10/2020 showed prior procedure site changes in surrounding soft tissue. Seven lymph nodes were dissected, all of which were negative for carcinoma.  Symptomatically, the patient reports: No lymphedema  Pain issues, if any: Patient denies, but does report ongoing numbness from her surgery  SAFETY ISSUES:  Prior radiation? No  Pacemaker/ICD? No  Possible current pregnancy? No  Is the patient on methotrexate? No  Current Complaints / other details: Anticipates pembrolizumab after radiation therapy.  Her port a cath has been removed and she does not want to undergo port reinsertion.  She will discuss with medical oncology.           ALLERGIES:  is allergic to [facility] [fosaprepitant].  Meds: Current Outpatient Medications  Medication Sig Dispense Refill  . Biotin w/ Vitamins C & E (HAIR/SKIN/NAILS PO) Take 1 tablet by mouth daily.    . Cholecalciferol (VITAMIN D3) 250 MCG (10000 UT) capsule Take  10,000 Units by mouth daily.    . ferrous gluconate (FERGON) 324 MG tablet Take 1 tablet (324 mg total) by mouth daily with breakfast. 100 tablet 3  . MILK THISTLE PO Take 1 capsule by mouth daily.    . Misc Natural Products [facility]) CAPS Take 2 capsules by mouth daily.    . Misc Natural Products (PHYTOCILLIN) CAPS Take 2 capsules by mouth daily.    Marland Kitchen OVER THE COUNTER MEDICATION Take 10 mLs by mouth 2 (two) times daily. floradix otc supplement    . OVER THE COUNTER MEDICATION Take 2 capsules by mouth daily. kyolic aged garlic otc supplement    . acetaminophen (TYLENOL) 500 MG tablet Take 1-2 tablets (500-1,000 mg total) by mouth every 6 (six) hours as needed. (Patient not taking: Reported on 06/05/2020) 60 tablet 0  . capecitabine (XELODA) 500 MG tablet Take 2 tablets (1,000 mg total) by mouth 2 (two) times daily after a meal. Take only on radiation days. (Patient not taking: Reported on 06/05/2020) 120 tablet 0  . ibuprofen (ADVIL) 800 MG tablet Take 1 tablet (800 mg total) by mouth every 8 (eight) hours as needed. (Patient not taking: Reported on 06/05/2020) 30 tablet 0  . ibuprofen (ADVIL) 800 MG tablet Take 1 tablet (800 mg total) by mouth every 8 (eight) hours as needed. (Patient not taking: Reported on 06/05/2020) 30 tablet 0  . loratadine (CLARITIN) 10 MG tablet Take 1 tablet (10 mg total) by mouth daily. (Patient not taking: Reported on 03/22/2020) 60 tablet 0  . oxyCODONE (OXY IR/ROXICODONE) 5 MG immediate release tablet Take  1 tablet (5 mg total) by mouth every 6 (six) hours as needed for severe pain. (Patient not taking: Reported on 06/05/2020) 15 tablet 0  . valACYclovir (VALTREX) 1000 MG tablet TAKE 1 TABLET BY MOUTH EVERY DAY (Patient not taking: Reported on 03/22/2020) 90 tablet 1   No current facility-administered medications for this encounter.    Physical Findings:  vitals were not taken for this visit. .     General: Alert and oriented, in no acute distress   Lab  Findings: Lab Results  Component Value Date   WBC 6.5 04/17/2020   HGB 11.0 (L) 04/17/2020   HCT 31.7 (L) 04/17/2020   MCV 79.3 (L) 04/17/2020   PLT 223 04/17/2020    CMP     Component Value Date/Time   NA 142 04/17/2020 0843   K 3.7 04/17/2020 0843   CL 105 04/17/2020 0843   CO2 30 04/17/2020 0843   GLUCOSE 119 (H) 04/17/2020 0843   BUN 9 04/17/2020 0843   CREATININE 0.82 04/17/2020 0843   CREATININE 0.95 10/17/2019 1529   CALCIUM 9.8 04/17/2020 0843   PROT 7.0 04/17/2020 0843   ALBUMIN 3.8 04/17/2020 0843   AST 21 04/17/2020 0843   AST 16 10/17/2019 1529   ALT 15 04/17/2020 0843   ALT 13 10/17/2019 1529   ALKPHOS 70 04/17/2020 0843   BILITOT 0.8 04/17/2020 0843   BILITOT 1.1 10/17/2019 1529   GFRNONAA >60 04/17/2020 0843   GFRNONAA >60 10/17/2019 1529   GFRAA >60 03/27/2020 1008   GFRAA >60 10/17/2019 1529     Radiographic Findings: No results found.  Impression/Plan: This is a delightful 44 year old woman with history of right breast cancer, BRCA1+; she elected for breast conserving surgery after reviewing options and recommendations with her surgeon.  We discussed adjuvant radiotherapy today.  I recommend radiation therapy to the right breast and regional nodes in order to reduce risk of local regional recurrence.  I reviewed the logistics, benefits, risks, and potential side effects of this treatment in detail. Risks may include but not necessary be limited to acute and late injury tissue in the radiation fields such as skin irritation (change in color/pigmentation, itching, dryness, pain, peeling). She may experience fatigue. We also discussed possible risk of long term cosmetic changes or scar tissue. There is also a smaller risk for lung toxicity,  brachial plexopathy, lymphedema, musculoskeletal changes, rib fragility or induction of a second malignancy, late chronic non-healing soft tissue wound.   She will receive Xeloda concurrently through medical oncology.  We  discussed that Xeloda can increase side effects the skin during radiation therapy.  As a precaution we will obtain a urine pregnancy test on treatment planning day; the patient is certain that she is not pregnant and we will do this out of an abundance of caution.  The patient asked good questions which I answered to her satisfaction. She is enthusiastic about proceeding with treatment.  We discussed measures to reduce the risk of infection during the COVID-19 pandemic.  She feels very strongly that she does not wish to discuss the vaccine.  I let her know that if she ever has questions or changes her mind in the future that I am happy to talk about it as I want to minimize her risk of other health conditions and complications during her cancer journey.    This encounter was provided by telemedicine platform; patient desired telemedicine during pandemic precautions.  MyChart video was used. The patient has given verbal consent for  this type of encounter and has been advised to only accept a meeting of this type in a secure network environment. On date of service, in total, I spent 35 minutes on this encounter (including discussion with patient, review of records, and documentation).   The attendants for this meeting include Eppie Gibson  and [name] Hendrix-Wilson During the encounter, Eppie Gibson was located at [facility] Radiation Oncology Department.  Leeester Hendrix-Wilson was located at home.   _____________________________________   Eppie Gibson, MD  This document serves as a record of services personally performed by Eppie Gibson, MD. It was created on his behalf by Clerance Lav, a trained medical scribe. The creation of this record is based on the scribe's personal observations and the provider's statements to them. This document has been checked and approved by the attending provider.

## 2020-06-04 NOTE — Progress Notes (Signed)
Location of Breast Cancer: Malignant neoplasm of upper-inner quadrant of RIGHT breast, estrogen receptor negative  Histology per Pathology Report:  05/10/2020 FINAL MICROSCOPIC DIAGNOSIS:  A. LYMPH NODES, RIGHT AXILLARY CONTENTS, DISSECTION:  - Seven lymph nodes, negative for carcinoma (0/7).  - Prior procedure site changes in surrounding soft tissue.   04/05/2020 FINAL MICROSCOPIC DIAGNOSIS:  A. BREAST, RIGHT, LUMPECTOMY:  - Invasive ductal carcinoma, grade 3 associated with fibrosis and histiocytic infiltrates.  - Margins not involved.  - Invasive carcinoma 0.4 cm from posterior and superior margins.  - Biopsy site and biopsy clip.  - See oncology table.  B. BREAST, RIGHT ADDITIONAL LATERAL MARGIN, EXCISION:  - Benign breast tissue.  - No carcinoma identified.  - Final lateral margin free of tumor.  C. LYMPH NODE, RIGHT AXILLARY, DISSECTION:  - Metastatic carcinoma involving one lymph node (1/1).  - Metastatic nodule is 0.6 cm.  Receptor Status: ER(NEGATIVE), PR (NEGATIVE), Her2-neu (NEGATIVE via [place]), Ki-67(50%)  Did patient present with symptoms (if so, please note symptoms) or was this found on screening mammography?: Patient palpated a lump in the upper-inner right breast sometime late December 2020 or early January 2021.  As the mass grew larger and persisted through menstrual periods she brought it to medical attention and underwent bilateral diagnostic mammography with tomography and bilateral breast ultrasonography at [facility] on 09/26/2019 showing: breast density category C; 5.1 cm mass involving upper-inner quadrant of right breast; no evidence of right axillary lymphadenopathy or left breast malignancy.  Past/Anticipated interventions by surgeon, if any: 05/10/2020 --Dr. [name] Cornett Right axillary lymph node dissectionRight axillary lymph node dissection  04/05/2020 --Dr. [name] Cornett Right breast seed localized lumpectomy with bracketed lumpectomy  performed in targeted right axillary lymph node biopsy seed localization injection of methylene blue dye with right axillary sentinel lymph node mapping and port removal  Past/Anticipated interventions by medical oncology, if any:  Under care of Dr. Lurline Del 05/28/2020 Vernard Gambles tumor is PD-L1 positive.  We are going to proceed to pembrolizumab once she is done with radiation treatments 04/17/2020 (1) genetics testing 2019-11-01 through the the [facility] Multi-Cancer panel found a pathogenic variant in the BRCA1 gene called c.815_824dup (p.Thr276Alafs*14)              (a) two variants of uncertain significance were noted - one in the [facility] gene called c.1295T>G and one in the POLD1 gene called c.34G>A             (b) no additional mutations of concern were found in AIP, ALK, APC, ATM, AXIN2,BAP1, BARD1, BLM, BMPR1A, BRCA1, BRCA2, BRIP1, CASR, CDC73, CDH1, CDK4, CDKN1B, CDKN1C, CDKN2A (p14ARF), CDKN2A (p16INK4a), CEBPA, CHEK2, CTNNA1, DICER1, DIS3L2, EGFR (c.2369C>T, p.Thr790Met variant only), EPCAM (Deletion/duplication testing only),FH, FLCN, GATA2, GPC3, GREM1 (Promoter region deletion/duplication testing only), HOXB13 (c.251G>A, p.Gly84Glu), HRAS, KIT, MAX, MEN1, MET, MITF(c.952G>A, p.Glu318Lys variant only), MLH1, MSH2, MSH3, MSH6, MUTYH, NBN, NF1, NF2, NTHL1, PALB2, PDGFRA, PHOX2B, PMS2, POLD1, POLE, POT1, PRKAR1A, PTCH1, PTEN, RAD50, RAD51C, RAD51D, RB1, RECQL4, RET, RNF43, RUNX1, SDHAF2, SDHA(sequence changes only),SDHB, SDHC, SDHD, SMAD4, SMARCA4, SMARCB1, SMARCE1, STK11, SUFU, TERC, TERT, TMEM127, TP53, TSC1, TSC2, VHL, WRN andWT1. (2) neoadjuvant chemotherapy consisting of cyclophosphamide and doxorubicin in dose dense fashion x4 started 11/16/2019 and completed 12/27/2019, followed by paclitaxel and carboplatin weekly x12 starting 01/10/2020             (a) echo 10/24/2019 shows an ejection fraction in the 60-65% range             (b)  patient opted to discontinue  paclitaxel/carboplatin treatments after 6 doses, last dose 02/14/2020 (3) status post right lumpectomy 04/05/2020 for a residual ypT2 ypN1 invasive ductal carcinoma, grade 3, with negative margins.             (a) the single removed right axillary lymph node was positive             (b) completion axillary lymph node dissection planned for 05/10/2020 (4) adjuvant radiation to follow with capecitabine sensitization (5) to continue capecitabine to total 6 months (6) pembrolizumab/Keytruda to start after capecitabine, continue for 1 year (7) hemoglobin C trait:             (a) ferritin 10/17/2019 was 45 with saturation 25%, hemoglobin 11.8 and MCV 71.5             (b) hemoglobin electrophoresis 2019-11-22 showed: A: 62.6%, A2: 3.7%, C: 33.2%, F:0 5.1%, S: 0% --Tentatively I have made her a return appointment with me for June 12, 2020 at 1PM.  We will do a Zoom at that point including her husband and son.  Lymphedema issues, if any:  Patient denies    Pain issues, if any: Patient denies, but does report ongoing numbness from her surgery  SAFETY ISSUES:  Prior radiation? No  Pacemaker/ICD? No  Possible current pregnancy? No  Is the patient on methotrexate? No  Current Complaints / other details:  Nothing of note

## 2020-06-05 ENCOUNTER — Ambulatory Visit
Admission: RE | Admit: 2020-06-05 | Discharge: 2020-06-05 | Disposition: A | Discharge status: Home / Self Care | Payer: Medicaid Other | Source: Ambulatory Visit | Attending: Radiation Oncology | Admitting: Radiation Oncology

## 2020-06-05 ENCOUNTER — Encounter: Payer: Self-pay | Admitting: Radiation Oncology

## 2020-06-05 DIAGNOSIS — C50211 Malignant neoplasm of upper-inner quadrant of right female breast: Secondary | ICD-10-CM

## 2020-06-05 DIAGNOSIS — Z171 Estrogen receptor negative status [ER-]: Secondary | ICD-10-CM

## 2020-06-06 ENCOUNTER — Ambulatory Visit: Payer: Medicaid Other | Admitting: Radiation Oncology

## 2020-06-06 ENCOUNTER — Encounter: Payer: Self-pay | Admitting: Radiation Oncology

## 2020-06-06 NOTE — Telephone Encounter (Signed)
Oral Chemotherapy Pharmacist Encounter   Attempted to reach patient to provide update and offer for initial counseling on oral medication: Xeloda (capecitabine).   No answer. Unable to leave voicemail due to mailbox being full.   Leron Croak, PharmD, BCPS Hematology/Oncology Clinical Pharmacist [facility] [phone] 06/06/2020 2:20 PM

## 2020-06-07 NOTE — Telephone Encounter (Signed)
Oral Chemotherapy Pharmacist Encounter   Attempted to reach patient to provide update and offer for initial counseling on oral medication: Xeloda (capecitabine).   No answer. Left voicemail for patient to call back to discuss details of medication acquisition and initial counseling session.  Leron Croak, PharmD, BCPS Hematology/Oncology Clinical Pharmacist [facility] [phone] 06/07/2020 3:05 PM

## 2020-06-08 ENCOUNTER — Ambulatory Visit: Payer: Medicaid Other | Admitting: Radiation Oncology

## 2020-06-08 ENCOUNTER — Ambulatory Visit: Payer: Medicaid Other

## 2020-06-08 ENCOUNTER — Ambulatory Visit: Payer: BLUE CROSS/BLUE SHIELD | Admitting: Radiation Oncology

## 2020-06-08 NOTE — Telephone Encounter (Signed)
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  Counseled patient on administration, dosing, side effects, monitoring, drug-food interactions, safe handling, storage, and disposal.  Patient will take Xeloda 500mg  tablets, 2 tablets (1000mg ) by mouth in AM and 2 tabs (1000mg ) by mouth in PM, within 30 minutes of finishing meals, on days of radiation only.  Xeloda and radiation start date: currently pending - pt knows not to start Xeloda until first day of radiation.   Adverse effects of Xeloda include but are not limited to: fatigue, decreased blood counts, GI upset, diarrhea, mouth sores, and hand-foot syndrome.  Patient will obtain anti diarrheal and alert the office of 4 or more loose stools above baseline.  Patient will have baseline pregnancy test prior to initiation of Xeloda/XRT - scheduled on 06/12/20  Reviewed with patient importance of keeping a medication schedule and plan for any missed doses. No barriers to medication adherence identified.  Medication reconciliation performed and medication/allergy list updated.  Insurance authorization for Xeloda has been obtained. Test claim at the pharmacy revealed copayment $3 for 1st fill of Xeloda. This will ship from the [facility] on 06/11/20 to deliver to patient's home on 06/12/20.  Patient informed the pharmacy will reach out 5-7 days prior to needing next fill of Xeloda to coordinate continued medication acquisition to prevent break in therapy.  All questions answered.  Ms. [name] voiced understanding and appreciation.   Medication education handout placed in mail for patient. Patient knows to call the office with questions or concerns. Oral Chemotherapy Clinic phone number provided to patient.   Leron Croak, PharmD,  BCPS Hematology/Oncology Clinical Pharmacist [facility] [phone] 06/08/2020 1:40 PM

## 2020-06-11 NOTE — Progress Notes (Signed)
[place]  [phone] [phone]     ID: Krista [name] DOB: [birth year]  MR#: [id number]  WLS#:[id number]  Patient Care Team: Everett Graff, MD as PCP - General (Obstetrics and Gynecology) Alphonsus Doyel, [name], MD as Consulting Physician (Oncology) Erroll Luna, MD as Consulting Physician (General Surgery) Eppie Gibson, MD as Attending Physician (Radiation Oncology) Rockwell Germany, RN as Oncology Nurse Navigator Mauro Kaufmann, RN as Oncology Nurse Navigator Aurea Graff OTHER MD:   CHIEF COMPLAINT: Triple negative breast cancer  CURRENT TREATMENT: Adjuvant radiation pending   INTERVAL HISTORY: Krista [name] returns today for follow-up of her triple negative breast cancer. Since her last visit, she underwent right axillary lymph node dissection on 05/10/2020. Pathology from the procedure ([id number]) showed seven benign lymph nodes (0/7).  Her final surgical sample was sent for Caris testing. The results, returned on 05/22/2020, revealed PD-L1 positivity. We are going to proceed to pembrolizumab once she is done with radiation treatments.  She was referred back to Dr. Isidore Moos on 06/05/2020 to discuss radiation therapy. She is scheduled for CT simulation later this afternoon, and she will receive treatment from 06/19/2020 through 08/01/2020.  REVIEW OF SYSTEMS: Krista [name] is back to work and enjoying it.  She tells me the family is now being more helpful after she let them know what they needed to do.  She would much prefer she says not to receive any treatment concurrent with radiation.  She is beginning to start an exercise program.  A detailed review of systems today was otherwise stable   COVID 19 VACCINATION STATUS: declines vaccine   HISTORY OF CURRENT ILLNESS: From the original intake note:  Krista [name] ["Krista [name]"] herself palpated a lump in the upper-inner right breast sometime late December 2020  or early January 2021.  As the mass grew larger and persisted through menstrual periods she brought it to medical attention and underwent bilateral diagnostic mammography with tomography and bilateral breast ultrasonography at [facility] on 09/26/2019 showing: breast density category C; 5.1 cm mass involving upper-inner quadrant of right breast; no evidence of right axillary lymphadenopathy or left breast malignancy.  Accordingly on 10/03/2019 she proceeded to biopsy of the right breast area in question. The pathology from this procedure ([id number]) showed: invasive ductal carcinoma, grade 3. Prognostic indicators significant for: estrogen receptor, 0% negative and progesterone receptor, 0% negative. Proliferation marker Ki67 at 80%. HER2 negative by immunohistochemistry (1+).  The patient's subsequent history is as detailed below.   PAST MEDICAL HISTORY: Past Medical History:  Diagnosis Date  . Anemia    history of  . Cancer [facility])    right breast cancer 10/03/19, received chemotherapy  . Family history of breast cancer   . Family history of cervical cancer   . Family history of lung cancer   . Hemorrhoids     PAST SURGICAL HISTORY: Past Surgical History:  Procedure Laterality Date  . AXILLARY LYMPH NODE DISSECTION Right 05/10/2020   Procedure: RIGHT AXILLARY LYMPH NODE DISSECTION;  Surgeon: Erroll Luna, MD;  Location: [facility];  Service: General;  Laterality: Right;  . BREAST LUMPECTOMY WITH RADIOACTIVE SEED AND SENTINEL LYMPH NODE BIOPSY Right 04/05/2020   Procedure: RIGHT BREAST BRACKETED LUMPECTOMY WITH RADIOACTIVE SEED AND RIGHT AXILLARY  TARGETED LYMPH NODE BIOPSY AND SENTINEL LYMPH NODE MAPPING;  Surgeon: Erroll Luna, MD;  Location: [facility];  Service: General;  Laterality: Right;  PEC BLOCK  . IR IMAGING GUIDED PORT INSERTION  11/07/2019  .  PORT-A-CATH REMOVAL Left 04/05/2020   Procedure: REMOVAL PORT-A-CATH;  Surgeon: Erroll Luna, MD;  Location: [facility];   Service: General;  Laterality: Left;    FAMILY HISTORY: Family History  Problem Relation Age of Onset  . Breast cancer Mother 60       double mastectomy  . Breast cancer Half-Sister 31  . Cirrhosis Half-Sister   . Heart Problems Father   . Stroke Father   . Breast cancer Maternal Aunt 90  . Cancer Maternal Uncle 72       unknown type  . Ovarian cancer Maternal Grandmother        dx. in her mid-40s  . Lung cancer Paternal Grandmother   . Cancer Other        unknown type; maternal great-uncles/aunts (x3)  . Diabetes Half-Brother   . Cancer Other        unknown type; matenral great-uncles/aunt (x4)  . Cancer Maternal Great-grandfather        unknown type  . Breast cancer Cousin 83       paternal cousin  The patient's father died at age 68 from cardiac complications of drug use.  His mother, the patient's paternal grandmother had what seems to have been cancer of the lung metastatic to the spine.  There was no other cancer on his side of the family to the patient's knowledge.  The patient's mother died at age 53 in the setting of multiple medical issues but she had undergone double mastectomy at the age of 10.  Her mother, the patient's maternal grandmother had cervical cancer.  The patient's mother had 3 brothers and 3 sisters.  1 of those 3 sisters had breast cancer diagnosed at the age of 77.  The patient herself has 2 brothers and 3 sisters one of her sisters was diagnosed with breast cancer at the age of 37 and died at the age of 11   GYNECOLOGIC HISTORY:  No LMP recorded. (Menstrual status: Chemotherapy). Menarche: 44 years old Age at first live birth: 44 years old [place] P 3 LMP regular, last approximately 5 days, of which the second day is heavy Contraceptive: Husband status post vasectomy HRT no  Hysterectomy? no BSO?  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  Her husband [name] is a Research scientist (medical) and  also Librarian, academic at [employer].  Their children are Rowe Pavy, 20, who is Scientist, product/process development at [employer], 18, who will be going to [employer] this coming year and then switch over to [employer], [employer] and aiming for an art rehabilitation degree; and Alanna, 25, currently attending [employer].  The patient is not a church attender    ADVANCED DIRECTIVES: In the absence of any documents to the contrary the patient's husband is her healthcare power of attorney   HEALTH MAINTENANCE: Social History   Tobacco Use  . Smoking status: Never Smoker  . Smokeless tobacco: Never Used  Vaping Use  . Vaping Use: Never used  Substance Use Topics  . Alcohol use: Not Currently  . Drug use: Never     Colonoscopy: n/a (age)  PAP: UTD  Bone density: n/a (age)   Allergies  Allergen Reactions  . Emend [Fosaprepitant] Shortness Of Breath    Current Outpatient Medications  Medication Sig Dispense Refill  . acetaminophen (TYLENOL) 500 MG tablet Take 1-2 tablets (500-1,000 mg total) by mouth every 6 (six) hours as needed. (Patient not  taking: Reported on 06/05/2020) 60 tablet 0  . Biotin w/ Vitamins C & E (HAIR/SKIN/NAILS PO) Take 1 tablet by mouth daily.    . capecitabine (XELODA) 500 MG tablet Take 2 tablets (1,000 mg total) by mouth 2 (two) times daily after a meal. Take only on radiation days. (Patient not taking: Reported on 06/05/2020) 120 tablet 0  . Cholecalciferol (VITAMIN D3) 250 MCG (10000 UT) capsule Take 10,000 Units by mouth daily.    . ferrous gluconate (FERGON) 324 MG tablet Take 1 tablet (324 mg total) by mouth daily with breakfast. 100 tablet 3  . ibuprofen (ADVIL) 800 MG tablet Take 1 tablet (800 mg total) by mouth every 8 (eight) hours as needed. (Patient not taking: Reported on 06/05/2020) 30 tablet 0  . ibuprofen (ADVIL) 800 MG tablet Take 1 tablet (800 mg total) by mouth every 8 (eight) hours as needed. (Patient not taking: Reported on 06/05/2020) 30 tablet 0  . loratadine  (CLARITIN) 10 MG tablet Take 1 tablet (10 mg total) by mouth daily. (Patient not taking: Reported on 03/22/2020) 60 tablet 0  . MILK THISTLE PO Take 1 capsule by mouth daily.    . Misc Natural Products [facility]) CAPS Take 2 capsules by mouth daily.    . Misc Natural Products (PHYTOCILLIN) CAPS Take 2 capsules by mouth daily.    Marland Kitchen OVER THE COUNTER MEDICATION Take 10 mLs by mouth 2 (two) times daily. floradix otc supplement    . OVER THE COUNTER MEDICATION Take 2 capsules by mouth daily. kyolic aged garlic otc supplement    . oxyCODONE (OXY IR/ROXICODONE) 5 MG immediate release tablet Take 1 tablet (5 mg total) by mouth every 6 (six) hours as needed for severe pain. (Patient not taking: Reported on 06/05/2020) 15 tablet 0  . valACYclovir (VALTREX) 1000 MG tablet TAKE 1 TABLET BY MOUTH EVERY DAY (Patient not taking: Reported on 03/22/2020) 90 tablet 1   No current facility-administered medications for this visit.    OBJECTIVE: African-American woman who appears stated age  There were no vitals filed for this visit. Wt Readings from Last 3 Encounters:  05/10/20 205 lb 14.6 oz (93.4 kg)  04/17/20 206 lb 9.6 oz (93.7 kg)  04/12/20 203 lb (92.1 kg)   There is no height or weight on file to calculate BMI.    ECOG FS:1 - Symptomatic but completely ambulatory  Sclerae unicteric, EOMs intact Wearing a mask No cervical or supraclavicular adenopathy Lungs no rales or rhonchi Heart regular rate and rhythm Abd soft, nontender, positive bowel sounds MSK no focal spinal tenderness, no upper extremity lymphedema Neuro: nonfocal, well oriented, appropriate affect Breasts: The right breast is status post lumpectomy.  There is no evidence of local recurrence.  The left breast is benign.  Both axillae are benign.   LAB RESULTS:  CMP     Component Value Date/Time   NA 142 04/17/2020 0843   K 3.7 04/17/2020 0843   CL 105 04/17/2020 0843   CO2 30 04/17/2020 0843   GLUCOSE 119 (H) 04/17/2020 0843    BUN 9 04/17/2020 0843   CREATININE 0.82 04/17/2020 0843   CREATININE 0.95 10/17/2019 1529   CALCIUM 9.8 04/17/2020 0843   PROT 7.0 04/17/2020 0843   ALBUMIN 3.8 04/17/2020 0843   AST 21 04/17/2020 0843   AST 16 10/17/2019 1529   ALT 15 04/17/2020 0843   ALT 13 10/17/2019 1529   ALKPHOS 70 04/17/2020 0843   BILITOT 0.8 04/17/2020 0843   BILITOT 1.1 10/17/2019  1529   GFRNONAA >60 04/17/2020 0843   GFRNONAA >60 10/17/2019 1529   GFRAA >60 03/27/2020 1008   GFRAA >60 10/17/2019 1529    No results found for: TOTALPROTELP, ALBUMINELP, A1GS, A2GS, BETS, BETA2SER, GAMS, MSPIKE, SPEI  Lab Results  Component Value Date   WBC 6.5 04/17/2020   NEUTROABS 4.1 04/17/2020   HGB 11.0 (L) 04/17/2020   HCT 31.7 (L) 04/17/2020   MCV 79.3 (L) 04/17/2020   PLT 223 04/17/2020    No results found for: LABCA2  No components found for: [id number]  No results for input(s): INR in the last 168 hours.  No results found for: LABCA2  No results found for: HWE993  No results found for: ZJI967  No results found for: ELF810  Lab Results  Component Value Date   CA2729 29.8 11/16/2019    No components found for: HGQUANT  No results found for: CEA1 / No results found for: CEA1   No results found for: AFPTUMOR  No results found for: CHROMOGRNA  No results found for: KPAFRELGTCHN, LAMBDASER, KAPLAMBRATIO (kappa/lambda light chains)  No results found for: HGBA, HGBA2QUANT, HGBFQUANT, HGBSQUAN (Hemoglobinopathy evaluation)   No results found for: LDH  Lab Results  Component Value Date   IRON 99 10/17/2019   TIBC 391 10/17/2019   IRONPCTSAT 25 10/17/2019   (Iron and TIBC)  Lab Results  Component Value Date   FERRITIN 45 10/17/2019    Urinalysis No results found for: COLORURINE, APPEARANCEUR, LABSPEC, PHURINE, GLUCOSEU, HGBUR, BILIRUBINUR, KETONESUR, PROTEINUR, UROBILINOGEN, NITRITE, LEUKOCYTESUR   STUDIES: No results found.   ELIGIBLE FOR AVAILABLE RESEARCH PROTOCOL:  F7510  ASSESSMENT: 44 y.o. [place] woman status post right breast upper inner quadrant biopsy 10/03/2019 for a clinical mT3 N1, stage IIIC invasive ductal carcinoma, grade 3, triple negative, with an MIB-1 of 80%  (a) biopsy of a right axillary node 2019-11-09 showed carcinoma  (b) CT chest/abd/pelvis 10/31/2019 shows 1.5 cm hypodense lesion in liver, lung nodules <0.3 cm  (c) bone scan 10/31/2019 negative  (d) liver MRI 11/11/2019 confirms a targetoid lesion in the left hepatic lobe suspicious for metastasis   (i) liver biopsy 2019-11-23 showed no evidence of malignancy (benign).  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 (p.Thr276Alafs*14)   (a) two variants of uncertain significance were noted - one in the [facility] gene called c.1295T>G and one in the POLD1 gene called c.34G>A  (b) no additional mutations of concern were found in AIP, ALK, APC, ATM, AXIN2,BAP1,  BARD1, BLM, BMPR1A, BRCA1, BRCA2, BRIP1, CASR, CDC73, CDH1, CDK4, CDKN1B, CDKN1C, CDKN2A (p14ARF), CDKN2A (p16INK4a), CEBPA, CHEK2, CTNNA1, DICER1, DIS3L2, EGFR (c.2369C>T, p.Thr790Met variant only), EPCAM (Deletion/duplication testing only), FH, FLCN, GATA2, GPC3, GREM1 (Promoter region deletion/duplication testing only), HOXB13 (c.251G>A, p.Gly84Glu), HRAS, KIT, MAX, MEN1, MET, MITF (c.952G>A, p.Glu318Lys variant only), MLH1, MSH2, MSH3, MSH6, MUTYH, NBN, NF1, NF2, NTHL1, PALB2, PDGFRA, PHOX2B, PMS2, POLD1, POLE, POT1, PRKAR1A, PTCH1, PTEN, RAD50, RAD51C, RAD51D, RB1, RECQL4, RET, RNF43, RUNX1, SDHAF2, SDHA (sequence changes only), SDHB, SDHC, SDHD, SMAD4, SMARCA4, SMARCB1, SMARCE1, STK11, SUFU, TERC, TERT, TMEM127, TP53, TSC1, TSC2, VHL, WRN and WT1.  (2) neoadjuvant chemotherapy consisting of cyclophosphamide and doxorubicin in dose dense fashion x4 started 11/16/2019 and completed  12/27/2019, followed by paclitaxel and carboplatin weekly x12 starting 01/10/2020  (a) echo 10/24/2019 shows an ejection fraction in the 60-65% range  (b) patient opted to discontinue paclitaxel/carboplatin  treatments after 6 doses, last dose 02/14/2020  (3) status post right lumpectomy 04/05/2020 for a residual ypT2 ypN1 invasive ductal carcinoma, grade 3, with negative margins.  (a) the single removed right axillary lymph node was positive  (b) completion axillary lymph node dissection planned for 05/10/2020  (4) adjuvant radiation to follow with capecitabine sensitization.   (5) hemoglobin C trait:  (a) ferritin 10/17/2019 was 45 with saturation 25%, hemoglobin 11.8 and MCV 71.5  (b) hemoglobin electrophoresis 2019-11-22 showed: A: 62.6%, A2: 3.7%, C: 33.2%, F:0 5.1%, S: 0%  (6) post radiation treatment to consist of capecitabine, pembrolizumab, and/or olaparib.  PLAN: Krista [name] feels finally like she is getting recovered and is ready to start radiation.  We were going to start pembrolizumab concurrently with that but she tells me she really does not like that idea and would just like to have the radiation by herself.  She did not explicitly reject the chemo sensitizing capecitabine so I am not sure whether she will take dose or not at this point.  Once she is done with radiation we can either do 6 months of capecitabine, with or without pembrolizumab, or start olaparib, or do some combination in sequence of those 3 agents.  I certainly would like to intensify her treatment some given the significant amount of residual disease after her right lumpectomy  Total encounter time 35 minutes.Sarajane Jews C. Subrena Devereux, MD 06/11/20 5:48 PM Medical Oncology and Hematology [facility] [place], [place] [address] Tel. [phone]    Fax. [phone]   I, Wilburn Mylar, am acting as scribe for Dr. [name]. Janayia Burggraf.  I, Lurline Del MD, have reviewed the  above documentation for accuracy and completeness, and I agree with the above.   *Total Encounter Time as defined by the Centers for Medicare and Medicaid Services includes, in addition to the face-to-face time of a patient visit (documented in the note above) non-face-to-face time: obtaining and reviewing outside history, ordering and reviewing medications, tests or procedures, care coordination (communications with other health care professionals or caregivers) and documentation in the medical record.

## 2020-06-12 ENCOUNTER — Other Ambulatory Visit: Payer: Self-pay

## 2020-06-12 ENCOUNTER — Ambulatory Visit: Payer: BLUE CROSS/BLUE SHIELD

## 2020-06-12 ENCOUNTER — Ambulatory Visit
Admission: RE | Admit: 2020-06-12 | Discharge: 2020-06-12 | Disposition: A | Discharge status: Home / Self Care | Payer: BLUE CROSS/BLUE SHIELD | Source: Ambulatory Visit | Attending: Radiation Oncology | Admitting: Radiation Oncology

## 2020-06-12 ENCOUNTER — Inpatient Hospital Stay: Payer: BLUE CROSS/BLUE SHIELD

## 2020-06-12 ENCOUNTER — Inpatient Hospital Stay: Payer: BLUE CROSS/BLUE SHIELD | Attending: Oncology | Admitting: Oncology

## 2020-06-12 ENCOUNTER — Encounter: Payer: Self-pay | Admitting: *Deleted

## 2020-06-12 VITALS — BP 159/112 | HR 91 | Temp 97.8°F | Resp 18 | Ht 67.0 in | Wt 206.4 lb

## 2020-06-12 DIAGNOSIS — D582 Other hemoglobinopathies: Secondary | ICD-10-CM | POA: Insufficient documentation

## 2020-06-12 DIAGNOSIS — Z8041 Family history of malignant neoplasm of ovary: Secondary | ICD-10-CM | POA: Insufficient documentation

## 2020-06-12 DIAGNOSIS — C50211 Malignant neoplasm of upper-inner quadrant of right female breast: Secondary | ICD-10-CM

## 2020-06-12 DIAGNOSIS — Z1501 Genetic susceptibility to malignant neoplasm of breast: Secondary | ICD-10-CM | POA: Diagnosis not present

## 2020-06-12 DIAGNOSIS — R918 Other nonspecific abnormal finding of lung field: Secondary | ICD-10-CM | POA: Diagnosis not present

## 2020-06-12 DIAGNOSIS — Z801 Family history of malignant neoplasm of trachea, bronchus and lung: Secondary | ICD-10-CM | POA: Insufficient documentation

## 2020-06-12 DIAGNOSIS — Z171 Estrogen receptor negative status [ER-]: Secondary | ICD-10-CM

## 2020-06-12 DIAGNOSIS — Z809 Family history of malignant neoplasm, unspecified: Secondary | ICD-10-CM | POA: Insufficient documentation

## 2020-06-12 DIAGNOSIS — Z803 Family history of malignant neoplasm of breast: Secondary | ICD-10-CM | POA: Insufficient documentation

## 2020-06-12 DIAGNOSIS — Z510 Encounter for antineoplastic radiation therapy: Secondary | ICD-10-CM | POA: Diagnosis present

## 2020-06-12 DIAGNOSIS — K769 Liver disease, unspecified: Secondary | ICD-10-CM | POA: Insufficient documentation

## 2020-06-12 DIAGNOSIS — C50411 Malignant neoplasm of upper-outer quadrant of right female breast: Secondary | ICD-10-CM

## 2020-06-12 DIAGNOSIS — C50911 Malignant neoplasm of unspecified site of right female breast: Secondary | ICD-10-CM | POA: Diagnosis not present

## 2020-06-12 DIAGNOSIS — D563 Thalassemia minor: Secondary | ICD-10-CM

## 2020-06-12 LAB — COMPREHENSIVE METABOLIC PANEL
ALT: 20 U/L (ref 0–44)
AST: 22 U/L (ref 15–41)
Albumin: 3.8 g/dL (ref 3.5–5.0)
Alkaline Phosphatase: 90 U/L (ref 38–126)
Anion gap: 9 (ref 5–15)
BUN: 12 mg/dL (ref 6–20)
CO2: 26 mmol/L (ref 22–32)
Calcium: 9.6 mg/dL (ref 8.9–10.3)
Chloride: 105 mmol/L (ref 98–111)
Creatinine, Ser: 0.86 mg/dL (ref 0.44–1.00)
GFR, Estimated: >60 mL/min (ref >60)
Glucose, Bld: 140 mg/dL — ABNORMAL HIGH (ref 70–99)
Potassium: 3.8 mmol/L (ref 3.5–5.1)
Sodium: 140 mmol/L (ref 135–145)
Total Bilirubin: 0.9 mg/dL (ref 0.3–1.2)
Total Protein: 7.5 g/dL (ref 6.5–8.1)

## 2020-06-12 LAB — PREGNANCY, URINE: Preg Test, Ur: NEGATIVE

## 2020-06-12 LAB — CBC WITH DIFFERENTIAL/PLATELET
Abs Immature Granulocytes: 0.01 10*3/uL (ref 0.00–0.07)
Basophils Absolute: 0 10*3/uL (ref 0.0–0.1)
Basophils Relative: 1 %
Eosinophils Absolute: 0.2 10*3/uL (ref 0.0–0.5)
Eosinophils Relative: 3 %
HCT: 34.2 % — ABNORMAL LOW (ref 36.0–46.0)
Hemoglobin: 11.9 g/dL — ABNORMAL LOW (ref 12.0–15.0)
Immature Granulocytes: 0 %
Lymphocytes Relative: 28 %
Lymphs Abs: 1.8 10*3/uL (ref 0.7–4.0)
MCH: 24.9 pg — ABNORMAL LOW (ref 26.0–34.0)
MCHC: 34.8 g/dL (ref 30.0–36.0)
MCV: 71.7 fL — ABNORMAL LOW (ref 80.0–100.0)
Monocytes Absolute: 0.3 10*3/uL (ref 0.1–1.0)
Monocytes Relative: 5 %
Neutro Abs: 4 10*3/uL (ref 1.7–7.7)
Neutrophils Relative %: 63 %
Platelets: 272 10*3/uL (ref 150–400)
RBC: 4.77 MIL/uL (ref 3.87–5.11)
RDW: 15 % (ref 11.5–15.5)
WBC: 6.4 10*3/uL (ref 4.0–10.5)
nRBC: 0 % (ref 0.0–0.2)

## 2020-06-13 DIAGNOSIS — Z510 Encounter for antineoplastic radiation therapy: Secondary | ICD-10-CM | POA: Diagnosis not present

## 2020-06-14 ENCOUNTER — Telehealth: Payer: Self-pay | Admitting: Oncology

## 2020-06-14 NOTE — Telephone Encounter (Signed)
Scheduled appts per 12/14 los. Was not able to leave voicemail. Mailed appt reminder and calendar

## 2020-06-18 ENCOUNTER — Telehealth: Payer: Self-pay

## 2020-06-18 NOTE — Telephone Encounter (Signed)
Called patient this morning to make sure she had Xeloda prescription to begin taking when she starts radiation on 06/19/2020. Unable to leave message because patient's voicemail was full. Called and spoke with patient's husband Krista [name] who stated he would pass information along to patient and have her call me back with update. Provided direct call back number.   Received voicemail from patient that she was under the impression she was not going to take her Xeloda until after she completed radiation, per her last conversation with Dr. Jana Hakim. She stated she was told "each provider would focus on their specific aspect of treatment" and not overlap.  Sent inbasket message to Dr. Jana Hakim with above information, and offered to call patient back and clarify Dr. [name] plan in taking Xeloda concurrently with radiation. Received response from Dr. Jana Hakim stating "No, that's just where she is. We'll do it one at a time" Will make Dr. Isidore Moos aware. Continue to support as needed

## 2020-06-19 ENCOUNTER — Ambulatory Visit
Admission: RE | Admit: 2020-06-19 | Discharge: 2020-06-19 | Disposition: A | Discharge status: Home / Self Care | Payer: BLUE CROSS/BLUE SHIELD | Source: Ambulatory Visit | Attending: Radiation Oncology | Admitting: Radiation Oncology

## 2020-06-19 DIAGNOSIS — Z510 Encounter for antineoplastic radiation therapy: Secondary | ICD-10-CM | POA: Diagnosis not present

## 2020-06-20 ENCOUNTER — Other Ambulatory Visit: Payer: Self-pay

## 2020-06-20 ENCOUNTER — Ambulatory Visit
Admission: RE | Admit: 2020-06-20 | Discharge: 2020-06-20 | Disposition: A | Discharge status: Home / Self Care | Payer: BLUE CROSS/BLUE SHIELD | Source: Ambulatory Visit | Attending: Radiation Oncology | Admitting: Radiation Oncology

## 2020-06-20 DIAGNOSIS — Z510 Encounter for antineoplastic radiation therapy: Secondary | ICD-10-CM | POA: Diagnosis not present

## 2020-06-21 ENCOUNTER — Ambulatory Visit
Admission: RE | Admit: 2020-06-21 | Discharge: 2020-06-21 | Disposition: A | Discharge status: Home / Self Care | Payer: BLUE CROSS/BLUE SHIELD | Source: Ambulatory Visit | Attending: Radiation Oncology | Admitting: Radiation Oncology

## 2020-06-21 DIAGNOSIS — Z510 Encounter for antineoplastic radiation therapy: Secondary | ICD-10-CM | POA: Diagnosis not present

## 2020-06-25 ENCOUNTER — Ambulatory Visit
Admission: RE | Admit: 2020-06-25 | Discharge: 2020-06-25 | Disposition: A | Discharge status: Home / Self Care | Payer: BLUE CROSS/BLUE SHIELD | Source: Ambulatory Visit | Attending: Radiation Oncology | Admitting: Radiation Oncology

## 2020-06-25 DIAGNOSIS — Z510 Encounter for antineoplastic radiation therapy: Secondary | ICD-10-CM | POA: Diagnosis not present

## 2020-06-26 ENCOUNTER — Ambulatory Visit
Admission: RE | Admit: 2020-06-26 | Discharge: 2020-06-26 | Disposition: A | Discharge status: Home / Self Care | Payer: BLUE CROSS/BLUE SHIELD | Source: Ambulatory Visit | Attending: Radiation Oncology | Admitting: Radiation Oncology

## 2020-06-26 ENCOUNTER — Other Ambulatory Visit: Payer: Self-pay

## 2020-06-26 DIAGNOSIS — C50211 Malignant neoplasm of upper-inner quadrant of right female breast: Secondary | ICD-10-CM

## 2020-06-26 DIAGNOSIS — Z171 Estrogen receptor negative status [ER-]: Secondary | ICD-10-CM

## 2020-06-26 DIAGNOSIS — Z510 Encounter for antineoplastic radiation therapy: Secondary | ICD-10-CM | POA: Diagnosis not present

## 2020-06-26 MED ORDER — RADIAPLEXRX EX GEL: Freq: Once | CUTANEOUS | Status: AC

## 2020-06-26 MED ORDER — ALRA NON-METALLIC DEODORANT (RAD-ONC)
1.0000 "application " | Freq: Once | TOPICAL | Status: AC
  Administered 2020-06-26: 1 via TOPICAL

## 2020-06-26 NOTE — Progress Notes (Signed)
Radiation Book, [facility]ra and Radiaplex given to patient. Common side effects of radiation reviewed with patient. Contact information for Dr. Karoline Caldwell nurse Rockne Coons provided. Patient verbalized understanding of information provided.

## 2020-06-27 ENCOUNTER — Ambulatory Visit: Payer: BLUE CROSS/BLUE SHIELD

## 2020-06-28 ENCOUNTER — Ambulatory Visit
Admission: RE | Admit: 2020-06-28 | Discharge: 2020-06-28 | Disposition: A | Discharge status: Home / Self Care | Payer: BLUE CROSS/BLUE SHIELD | Source: Ambulatory Visit | Attending: Radiation Oncology | Admitting: Radiation Oncology

## 2020-06-28 DIAGNOSIS — Z510 Encounter for antineoplastic radiation therapy: Secondary | ICD-10-CM | POA: Diagnosis not present

## 2020-07-02 ENCOUNTER — Ambulatory Visit
Admission: RE | Admit: 2020-07-02 | Discharge: 2020-07-02 | Disposition: A | Discharge status: Home / Self Care | Payer: BLUE CROSS/BLUE SHIELD | Source: Ambulatory Visit | Attending: Radiation Oncology | Admitting: Radiation Oncology

## 2020-07-02 ENCOUNTER — Other Ambulatory Visit: Payer: Self-pay

## 2020-07-02 DIAGNOSIS — C50211 Malignant neoplasm of upper-inner quadrant of right female breast: Secondary | ICD-10-CM | POA: Insufficient documentation

## 2020-07-02 DIAGNOSIS — Z171 Estrogen receptor negative status [ER-]: Secondary | ICD-10-CM | POA: Diagnosis present

## 2020-07-03 ENCOUNTER — Other Ambulatory Visit: Payer: Self-pay

## 2020-07-03 ENCOUNTER — Ambulatory Visit
Admission: RE | Admit: 2020-07-03 | Discharge: 2020-07-03 | Disposition: A | Discharge status: Home / Self Care | Payer: BLUE CROSS/BLUE SHIELD | Source: Ambulatory Visit | Attending: Radiation Oncology | Admitting: Radiation Oncology

## 2020-07-03 DIAGNOSIS — C50211 Malignant neoplasm of upper-inner quadrant of right female breast: Secondary | ICD-10-CM | POA: Diagnosis not present

## 2020-07-04 ENCOUNTER — Ambulatory Visit
Admission: RE | Admit: 2020-07-04 | Discharge: 2020-07-04 | Disposition: A | Discharge status: Home / Self Care | Payer: BLUE CROSS/BLUE SHIELD | Source: Ambulatory Visit | Attending: Radiation Oncology | Admitting: Radiation Oncology

## 2020-07-04 DIAGNOSIS — C50211 Malignant neoplasm of upper-inner quadrant of right female breast: Secondary | ICD-10-CM | POA: Diagnosis not present

## 2020-07-05 ENCOUNTER — Ambulatory Visit
Admission: RE | Admit: 2020-07-05 | Discharge: 2020-07-05 | Disposition: A | Discharge status: Home / Self Care | Payer: BLUE CROSS/BLUE SHIELD | Source: Ambulatory Visit | Attending: Radiation Oncology | Admitting: Radiation Oncology

## 2020-07-05 DIAGNOSIS — C50211 Malignant neoplasm of upper-inner quadrant of right female breast: Secondary | ICD-10-CM | POA: Diagnosis not present

## 2020-07-06 ENCOUNTER — Other Ambulatory Visit: Payer: Self-pay

## 2020-07-06 ENCOUNTER — Ambulatory Visit
Admission: RE | Admit: 2020-07-06 | Discharge: 2020-07-06 | Disposition: A | Discharge status: Home / Self Care | Payer: BLUE CROSS/BLUE SHIELD | Source: Ambulatory Visit | Attending: Radiation Oncology | Admitting: Radiation Oncology

## 2020-07-06 DIAGNOSIS — C50211 Malignant neoplasm of upper-inner quadrant of right female breast: Secondary | ICD-10-CM | POA: Diagnosis not present

## 2020-07-09 ENCOUNTER — Ambulatory Visit
Admission: RE | Admit: 2020-07-09 | Discharge: 2020-07-09 | Disposition: A | Discharge status: Home / Self Care | Payer: BLUE CROSS/BLUE SHIELD | Source: Ambulatory Visit | Attending: Radiation Oncology | Admitting: Radiation Oncology

## 2020-07-09 DIAGNOSIS — C50211 Malignant neoplasm of upper-inner quadrant of right female breast: Secondary | ICD-10-CM | POA: Diagnosis not present

## 2020-07-10 ENCOUNTER — Ambulatory Visit
Admission: RE | Admit: 2020-07-10 | Discharge: 2020-07-10 | Disposition: A | Discharge status: Home / Self Care | Payer: BLUE CROSS/BLUE SHIELD | Source: Ambulatory Visit | Attending: Radiation Oncology | Admitting: Radiation Oncology

## 2020-07-10 DIAGNOSIS — C50211 Malignant neoplasm of upper-inner quadrant of right female breast: Secondary | ICD-10-CM | POA: Diagnosis not present

## 2020-07-11 ENCOUNTER — Ambulatory Visit
Admission: RE | Admit: 2020-07-11 | Discharge: 2020-07-11 | Disposition: A | Discharge status: Home / Self Care | Payer: BLUE CROSS/BLUE SHIELD | Source: Ambulatory Visit | Attending: Radiation Oncology | Admitting: Radiation Oncology

## 2020-07-11 DIAGNOSIS — C50211 Malignant neoplasm of upper-inner quadrant of right female breast: Secondary | ICD-10-CM | POA: Diagnosis not present

## 2020-07-12 ENCOUNTER — Ambulatory Visit
Admission: RE | Admit: 2020-07-12 | Discharge: 2020-07-12 | Disposition: A | Discharge status: Home / Self Care | Payer: BLUE CROSS/BLUE SHIELD | Source: Ambulatory Visit | Attending: Radiation Oncology | Admitting: Radiation Oncology

## 2020-07-12 ENCOUNTER — Other Ambulatory Visit: Payer: Self-pay

## 2020-07-12 DIAGNOSIS — C50211 Malignant neoplasm of upper-inner quadrant of right female breast: Secondary | ICD-10-CM | POA: Diagnosis not present

## 2020-07-13 ENCOUNTER — Ambulatory Visit
Admission: RE | Admit: 2020-07-13 | Discharge: 2020-07-13 | Disposition: A | Discharge status: Home / Self Care | Payer: BLUE CROSS/BLUE SHIELD | Source: Ambulatory Visit | Attending: Radiation Oncology | Admitting: Radiation Oncology

## 2020-07-13 ENCOUNTER — Encounter: Payer: Self-pay | Admitting: *Deleted

## 2020-07-13 ENCOUNTER — Ambulatory Visit: Payer: BLUE CROSS/BLUE SHIELD

## 2020-07-13 ENCOUNTER — Other Ambulatory Visit: Payer: Self-pay | Admitting: Oncology

## 2020-07-13 DIAGNOSIS — C50211 Malignant neoplasm of upper-inner quadrant of right female breast: Secondary | ICD-10-CM | POA: Diagnosis not present

## 2020-07-13 NOTE — Progress Notes (Signed)
  Patient name: Iran Sizer [birth year]  Policy number: [id number]  Re: Medical necessity for molecular testing  Dear Lessie Dings or Ms.:  Please accept this letter of medical necessity to justify biomarker results for my patient. As you know, biomarker expression is a critical component of cancer care and instrumental in decoding cancer to better understand the biology driving tumor growth.   As the treating oncologist I have ordered biomarker testing as part of my patient's clinical treatment plan and I plan to use the biomarker results to develop the most appropriate treatment plan for this patient. The attached clinical history and medical records clearly indicate the need for molecular testing.  I consider biomarker testing to be medically necessary and request that you treat it as such.  Sincerely,    Lurline Del MD

## 2020-07-16 ENCOUNTER — Ambulatory Visit: Payer: BLUE CROSS/BLUE SHIELD

## 2020-07-16 ENCOUNTER — Ambulatory Visit: Payer: BLUE CROSS/BLUE SHIELD | Admitting: Radiation Oncology

## 2020-07-17 ENCOUNTER — Ambulatory Visit
Admission: RE | Admit: 2020-07-17 | Discharge: 2020-07-17 | Disposition: A | Discharge status: Home / Self Care | Payer: BLUE CROSS/BLUE SHIELD | Source: Ambulatory Visit | Attending: Radiation Oncology | Admitting: Radiation Oncology

## 2020-07-17 ENCOUNTER — Encounter: Payer: Self-pay | Admitting: Oncology

## 2020-07-17 ENCOUNTER — Other Ambulatory Visit: Payer: Self-pay

## 2020-07-17 ENCOUNTER — Other Ambulatory Visit: Payer: Self-pay | Admitting: Oncology

## 2020-07-17 DIAGNOSIS — C50211 Malignant neoplasm of upper-inner quadrant of right female breast: Secondary | ICD-10-CM | POA: Diagnosis not present

## 2020-07-17 DIAGNOSIS — Z1501 Genetic susceptibility to malignant neoplasm of breast: Secondary | ICD-10-CM

## 2020-07-17 DIAGNOSIS — C50911 Malignant neoplasm of unspecified site of right female breast: Secondary | ICD-10-CM

## 2020-07-18 ENCOUNTER — Ambulatory Visit
Admission: RE | Admit: 2020-07-18 | Discharge: 2020-07-18 | Disposition: A | Discharge status: Home / Self Care | Payer: BLUE CROSS/BLUE SHIELD | Source: Ambulatory Visit | Attending: Radiation Oncology | Admitting: Radiation Oncology

## 2020-07-18 DIAGNOSIS — C50211 Malignant neoplasm of upper-inner quadrant of right female breast: Secondary | ICD-10-CM | POA: Diagnosis not present

## 2020-07-19 ENCOUNTER — Other Ambulatory Visit: Payer: Self-pay

## 2020-07-19 ENCOUNTER — Ambulatory Visit
Admission: RE | Admit: 2020-07-19 | Discharge: 2020-07-19 | Disposition: A | Discharge status: Home / Self Care | Payer: BLUE CROSS/BLUE SHIELD | Source: Ambulatory Visit | Attending: Radiation Oncology | Admitting: Radiation Oncology

## 2020-07-19 DIAGNOSIS — C50211 Malignant neoplasm of upper-inner quadrant of right female breast: Secondary | ICD-10-CM | POA: Diagnosis not present

## 2020-07-20 ENCOUNTER — Other Ambulatory Visit: Payer: Self-pay

## 2020-07-20 ENCOUNTER — Ambulatory Visit
Admission: RE | Admit: 2020-07-20 | Discharge: 2020-07-20 | Disposition: A | Discharge status: Home / Self Care | Payer: BLUE CROSS/BLUE SHIELD | Source: Ambulatory Visit | Attending: Radiation Oncology | Admitting: Radiation Oncology

## 2020-07-20 DIAGNOSIS — C50211 Malignant neoplasm of upper-inner quadrant of right female breast: Secondary | ICD-10-CM | POA: Diagnosis not present

## 2020-07-23 ENCOUNTER — Ambulatory Visit
Admission: RE | Admit: 2020-07-23 | Discharge: 2020-07-23 | Disposition: A | Discharge status: Home / Self Care | Payer: BLUE CROSS/BLUE SHIELD | Source: Ambulatory Visit | Attending: Radiation Oncology | Admitting: Radiation Oncology

## 2020-07-23 ENCOUNTER — Ambulatory Visit: Payer: BLUE CROSS/BLUE SHIELD | Admitting: Radiation Oncology

## 2020-07-23 DIAGNOSIS — C50211 Malignant neoplasm of upper-inner quadrant of right female breast: Secondary | ICD-10-CM | POA: Diagnosis not present

## 2020-07-24 ENCOUNTER — Other Ambulatory Visit: Payer: Self-pay

## 2020-07-24 ENCOUNTER — Ambulatory Visit
Admission: RE | Admit: 2020-07-24 | Discharge: 2020-07-24 | Disposition: A | Discharge status: Home / Self Care | Payer: BLUE CROSS/BLUE SHIELD | Source: Ambulatory Visit | Attending: Radiation Oncology | Admitting: Radiation Oncology

## 2020-07-24 DIAGNOSIS — C50211 Malignant neoplasm of upper-inner quadrant of right female breast: Secondary | ICD-10-CM | POA: Diagnosis not present

## 2020-07-24 DIAGNOSIS — Z171 Estrogen receptor negative status [ER-]: Secondary | ICD-10-CM

## 2020-07-24 MED ORDER — RADIAPLEXRX EX GEL: Freq: Once | CUTANEOUS | Status: AC

## 2020-07-25 ENCOUNTER — Other Ambulatory Visit: Payer: Self-pay

## 2020-07-25 ENCOUNTER — Ambulatory Visit
Admission: RE | Admit: 2020-07-25 | Discharge: 2020-07-25 | Disposition: A | Discharge status: Home / Self Care | Payer: BLUE CROSS/BLUE SHIELD | Source: Ambulatory Visit | Attending: Radiation Oncology | Admitting: Radiation Oncology

## 2020-07-25 DIAGNOSIS — C50211 Malignant neoplasm of upper-inner quadrant of right female breast: Secondary | ICD-10-CM | POA: Diagnosis not present

## 2020-07-26 ENCOUNTER — Ambulatory Visit: Payer: BLUE CROSS/BLUE SHIELD

## 2020-07-26 ENCOUNTER — Other Ambulatory Visit: Payer: Self-pay

## 2020-07-26 ENCOUNTER — Ambulatory Visit
Admission: RE | Admit: 2020-07-26 | Discharge: 2020-07-26 | Disposition: A | Discharge status: Home / Self Care | Payer: BLUE CROSS/BLUE SHIELD | Source: Ambulatory Visit | Attending: Radiation Oncology | Admitting: Radiation Oncology

## 2020-07-26 DIAGNOSIS — C50211 Malignant neoplasm of upper-inner quadrant of right female breast: Secondary | ICD-10-CM | POA: Diagnosis not present

## 2020-07-27 ENCOUNTER — Ambulatory Visit: Payer: BLUE CROSS/BLUE SHIELD

## 2020-07-27 DIAGNOSIS — C50211 Malignant neoplasm of upper-inner quadrant of right female breast: Secondary | ICD-10-CM | POA: Diagnosis not present

## 2020-07-30 ENCOUNTER — Ambulatory Visit: Payer: BLUE CROSS/BLUE SHIELD

## 2020-07-30 DIAGNOSIS — C50211 Malignant neoplasm of upper-inner quadrant of right female breast: Secondary | ICD-10-CM | POA: Diagnosis not present

## 2020-07-31 ENCOUNTER — Ambulatory Visit: Payer: BLUE CROSS/BLUE SHIELD

## 2020-07-31 ENCOUNTER — Other Ambulatory Visit: Payer: Self-pay

## 2020-07-31 ENCOUNTER — Ambulatory Visit
Admission: RE | Admit: 2020-07-31 | Discharge: 2020-07-31 | Disposition: A | Discharge status: Home / Self Care | Payer: BLUE CROSS/BLUE SHIELD | Source: Ambulatory Visit | Attending: Radiation Oncology | Admitting: Radiation Oncology

## 2020-07-31 DIAGNOSIS — Z171 Estrogen receptor negative status [ER-]: Secondary | ICD-10-CM | POA: Diagnosis present

## 2020-07-31 DIAGNOSIS — C50211 Malignant neoplasm of upper-inner quadrant of right female breast: Secondary | ICD-10-CM | POA: Diagnosis present

## 2020-08-01 ENCOUNTER — Ambulatory Visit: Payer: BLUE CROSS/BLUE SHIELD

## 2020-08-01 ENCOUNTER — Ambulatory Visit
Admission: RE | Admit: 2020-08-01 | Discharge: 2020-08-01 | Disposition: A | Discharge status: Home / Self Care | Payer: BLUE CROSS/BLUE SHIELD | Source: Ambulatory Visit | Attending: Radiation Oncology | Admitting: Radiation Oncology

## 2020-08-01 DIAGNOSIS — C50211 Malignant neoplasm of upper-inner quadrant of right female breast: Secondary | ICD-10-CM | POA: Diagnosis not present

## 2020-08-02 ENCOUNTER — Ambulatory Visit: Payer: BLUE CROSS/BLUE SHIELD

## 2020-08-02 ENCOUNTER — Other Ambulatory Visit: Payer: Self-pay

## 2020-08-02 ENCOUNTER — Ambulatory Visit
Admission: RE | Admit: 2020-08-02 | Discharge: 2020-08-02 | Disposition: A | Discharge status: Home / Self Care | Payer: BLUE CROSS/BLUE SHIELD | Source: Ambulatory Visit | Attending: Radiation Oncology | Admitting: Radiation Oncology

## 2020-08-02 DIAGNOSIS — C50211 Malignant neoplasm of upper-inner quadrant of right female breast: Secondary | ICD-10-CM | POA: Diagnosis not present

## 2020-08-03 ENCOUNTER — Encounter: Payer: Self-pay | Admitting: *Deleted

## 2020-08-03 ENCOUNTER — Ambulatory Visit: Payer: BLUE CROSS/BLUE SHIELD

## 2020-08-03 ENCOUNTER — Encounter: Payer: Self-pay | Admitting: Radiation Oncology

## 2020-08-03 ENCOUNTER — Ambulatory Visit
Admission: RE | Admit: 2020-08-03 | Discharge: 2020-08-03 | Disposition: A | Discharge status: Home / Self Care | Payer: BLUE CROSS/BLUE SHIELD | Source: Ambulatory Visit | Attending: Radiation Oncology | Admitting: Radiation Oncology

## 2020-08-03 DIAGNOSIS — C50211 Malignant neoplasm of upper-inner quadrant of right female breast: Secondary | ICD-10-CM | POA: Diagnosis not present

## 2020-08-06 ENCOUNTER — Ambulatory Visit: Payer: BLUE CROSS/BLUE SHIELD

## 2020-08-09 NOTE — Progress Notes (Signed)
Pharmacist Chemotherapy Monitoring - Initial Assessment    Anticipated start date: 08/17/19  Regimen:   Are orders appropriate based on the patients diagnosis, regimen, and cycle? Yes  Does the plan date match the patients scheduled date? Yes  Is the sequencing of drugs appropriate? Yes  Are the premedications appropriate for the patients regimen? Yes  Prior Authorization for treatment is: Approved o If applicable, is the correct biosimilar selected based on the patient's insurance? not applicable  Organ Function and Labs:  Are dose adjustments needed based on the patient's renal function, hepatic function, or hematologic function? Yes  Are appropriate labs ordered prior to the start of patient's treatment? Yes  Other organ system assessment, if indicated: N/A  The following baseline labs, if indicated, have been ordered: pembrolizumab: baseline TSH +/- T4  Dose Assessment:  Are the drug doses appropriate? Yes  Are the following correct: o Drug concentrations Yes o IV fluid compatible with drug Yes o Administration routes Yes o Timing of therapy Yes  If applicable, does the patient have documented access for treatment and/or plans for port-a-cath placement? no  If applicable, have lifetime cumulative doses been properly documented and assessed? yes Lifetime Dose Tracking   Doxorubicin: 226.149 mg/m2 (480 mg) = 50.26 % of the maximum lifetime dose of 450 mg/m2   Carboplatin: 1,800 mg = 0.01 % of the maximum lifetime dose of 999,999,999 mg  o   Toxicity Monitoring/Prevention:  The patient has the following take home antiemetics prescribed: Prochlorperazine  The patient has the following take home medications prescribed: N/A  Medication allergies and previous infusion related reactions, if applicable, have been reviewed and addressed. Yes  The patient's current medication list has been assessed for drug-drug interactions with their chemotherapy regimen. no  significant drug-drug interactions were identified on review.  Order Review:  Are the treatment plan orders signed? No  Is the patient scheduled to see a provider prior to their treatment? Yes  I verify that I have reviewed each item in the above checklist and answered each question accordingly.  Philomena Course, [place], 08/09/2020  2:59 PM

## 2020-08-15 NOTE — Progress Notes (Signed)
Krista [name]  [phone] [phone]     ID: Krista [name] DOB: 10/16/1975  MR#: [id number]  AOZ#:[id number]  Patient Care Team: Krista Graff, MD as PCP - General (Obstetrics and Gynecology) Krista [name], Krista Dad, MD as Consulting Physician (Oncology) Krista Luna, MD as Consulting Physician (General Surgery) Krista Gibson, MD as Attending Physician (Radiation Oncology) Krista Germany, RN as Oncology Nurse Navigator Krista Kaufmann, RN as Oncology Nurse Navigator Krista Cruel, MD OTHER MD:   CHIEF COMPLAINT: Triple negative breast cancer  CURRENT TREATMENT: Adjuvant capecitabine   INTERVAL HISTORY: Krista [name] for follow-up of her triple negative breast cancer.   Since her last visit, she received radiation treatment from 06/19/2020 through 08/01/2020 under Dr. Isidore [name].  She generally did well with her treatments, with some fatigue, some hyperpigmentation, but no peeling.  She was able to continue to work virtually from home (which is the way she normally works) right through treatment.  We had prescribed Xeloda for her but she did not start it.  She says the reason she did not started is because she needed to discuss it further with me and somehow that did not happen.   REVIEW OF SYSTEMS: Krista [name] tells me that sometimes when she is stretching she feels a little wheeze in the right lung area.  She does not feel that at any other time.  When she has been in bed for a while or sitting for a while and stands her feet hurt.  She has to massage them a little and walk a little and then the feel better.  Aside from these issues a detailed review of systems [name]   COVID 19 VACCINATION STATUS: declines vaccine   HISTORY OF CURRENT ILLNESS: From the original intake note:  Krista [name] ["Krista [name]"] herself palpated a lump in the upper-inner right breast sometime late December 2020 or early January  2021.  As the mass grew larger and persisted through menstrual periods she brought it to medical attention and underwent bilateral diagnostic mammography with tomography and bilateral breast ultrasonography at [facility] on 09/26/2019 showing: breast density category C; 5.1 cm mass involving upper-inner quadrant of right breast; no evidence of right axillary lymphadenopathy or left breast malignancy.  Accordingly on 10/03/2019 she proceeded to biopsy of the right breast area in question. The pathology from this procedure ([id number]) showed: invasive ductal carcinoma, grade 3. Prognostic indicators significant for: estrogen receptor, 0% negative and progesterone receptor, 0% negative. Proliferation marker Ki67 at 80%. HER2 negative by immunohistochemistry (1+).  The patient's subsequent history is as detailed below.   PAST MEDICAL HISTORY: Past Medical History:  Diagnosis Date  . Anemia    history of  . Cancer [facility])    right breast cancer 10/03/19, received chemotherapy  . Family history of breast cancer   . Family history of cervical cancer   . Family history of lung cancer   . Hemorrhoids     PAST SURGICAL HISTORY: Past Surgical History:  Procedure Laterality Date  . AXILLARY LYMPH NODE DISSECTION Right 05/10/2020   Procedure: RIGHT AXILLARY LYMPH NODE DISSECTION;  Surgeon: Krista Luna, MD;  Location: [facility];  Service: General;  Laterality: Right;  . BREAST LUMPECTOMY WITH RADIOACTIVE SEED AND SENTINEL LYMPH NODE BIOPSY Right 04/05/2020   Procedure: RIGHT BREAST BRACKETED LUMPECTOMY WITH RADIOACTIVE SEED AND RIGHT AXILLARY  TARGETED LYMPH NODE BIOPSY AND SENTINEL LYMPH NODE MAPPING;  Surgeon: Krista Luna, MD;  Location: [facility];  Service: General;  Laterality: Right;  PEC BLOCK  . IR IMAGING GUIDED PORT INSERTION  11/07/2019  . PORT-A-CATH REMOVAL Left 04/05/2020   Procedure: REMOVAL PORT-A-CATH;  Surgeon: Krista Luna, MD;  Location: [facility];  Service:  General;  Laterality: Left;    FAMILY HISTORY: Family History  Problem Relation Age of Onset  . Breast cancer Mother 56       double mastectomy  . Breast cancer Half-Sister 5  . Cirrhosis Half-Sister   . Heart Problems Father   . Stroke Father   . Breast cancer Maternal Aunt 47  . Cancer Maternal Uncle 71       unknown type  . Ovarian cancer Maternal Grandmother        dx. in her mid-40s  . Lung cancer Paternal Grandmother   . Cancer Other        unknown type; maternal great-uncles/aunts (x3)  . Diabetes Half-Brother   . Cancer Other        unknown type; matenral great-uncles/aunt (x4)  . Cancer Maternal Great-grandfather        unknown type  . Breast cancer Cousin 72       paternal cousin  The patient's father died at age 31 from cardiac complications of drug use.  His mother, the patient's paternal grandmother had what seems to have been cancer of the lung metastatic to the spine.  There was no other cancer on his side of the family to the patient's knowledge.  The patient's mother died at age 59 in the setting of multiple medical issues but she had undergone double mastectomy at the age of 20.  Her mother, the patient's maternal grandmother had cervical cancer.  The patient's mother had 3 brothers and 3 sisters.  1 of those 3 sisters had breast cancer diagnosed at the age of 74.  The patient herself has 2 brothers and 3 sisters one of her sisters was diagnosed with breast cancer at the age of 2 and died at the age of 29   GYNECOLOGIC HISTORY:  No LMP recorded. (Menstrual status: Chemotherapy). Menarche: 45 years old Age at first live birth: 45 years old [place] P 3 LMP regular, last approximately 5 days, of which the second day is heavy Contraceptive: Husband status post vasectomy HRT no  Hysterectomy? no BSO?  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  Her husband Krista [name] is a Research scientist (medical) and also  Librarian, academic at [employer].  Their children are Krista [name], 20, who is Scientist, product/process development at [employer], 18, who will be going to [employer] this coming year and then switch over to [employer], [place] [employer] and aiming for an art rehabilitation degree; and Alanna, 77, currently attending [employer].  The patient is not a church attender    ADVANCED DIRECTIVES: In the absence of any documents to the contrary the patient's husband is her healthcare power of attorney   HEALTH MAINTENANCE: Social History   Tobacco Use  . Smoking status: Never Smoker  . Smokeless tobacco: Never Used  Vaping Use  . Vaping Use: Never used  Substance Use Topics  . Alcohol use: Not Currently  . Drug use: Never     Colonoscopy: n/a (age)  PAP: UTD  Bone density: n/a (age)   Allergies  Allergen Reactions  . Emend [Fosaprepitant] Shortness Of Breath    Current Outpatient Medications  Medication Sig Dispense Refill  .  acetaminophen (TYLENOL) 500 MG tablet Take 1-2 tablets (500-1,000 mg total) by mouth every 6 (six) hours as needed. (Patient not taking: Reported on 06/05/2020) 60 tablet 0  . Biotin w/ Vitamins C & E (HAIR/SKIN/NAILS PO) Take 1 tablet by mouth daily.    . capecitabine (XELODA) 500 MG tablet Take 2 tablets (1,000 mg total) by mouth 2 (two) times daily after a meal. Take only on radiation days. (Patient not taking: Reported on 06/05/2020) 120 tablet 0  . Cholecalciferol (VITAMIN D3) 250 MCG (10000 UT) capsule Take 10,000 Units by mouth daily.    . ferrous gluconate (FERGON) 324 MG tablet Take 1 tablet (324 mg total) by mouth daily with breakfast. 100 tablet 3  . ibuprofen (ADVIL) 800 MG tablet Take 1 tablet (800 mg total) by mouth every 8 (eight) hours as needed. (Patient not taking: Reported on 06/05/2020) 30 tablet 0  . ibuprofen (ADVIL) 800 MG tablet Take 1 tablet (800 mg total) by mouth every 8 (eight) hours as needed. (Patient not taking: Reported on 06/05/2020) 30 tablet 0  . loratadine  (CLARITIN) 10 MG tablet Take 1 tablet (10 mg total) by mouth daily. (Patient not taking: Reported on 03/22/2020) 60 tablet 0  . MILK THISTLE PO Take 1 capsule by mouth daily.    . Misc Natural Products [facility]) CAPS Take 2 capsules by mouth daily.    . Misc Natural Products (PHYTOCILLIN) CAPS Take 2 capsules by mouth daily.    Marland Kitchen OVER THE COUNTER MEDICATION Take 10 mLs by mouth 2 (two) times daily. floradix otc supplement    . OVER THE COUNTER MEDICATION Take 2 capsules by mouth daily. kyolic aged garlic otc supplement    . oxyCODONE (OXY IR/ROXICODONE) 5 MG immediate release tablet Take 1 tablet (5 mg total) by mouth every 6 (six) hours as needed for severe pain. (Patient not taking: Reported on 06/05/2020) 15 tablet 0  . valACYclovir (VALTREX) 1000 MG tablet TAKE 1 TABLET BY MOUTH EVERY DAY (Patient not taking: Reported on 03/22/2020) 90 tablet 1   No current facility-administered medications for this visit.    OBJECTIVE: African-American woman in no acute distress  Vitals:   08/16/20 1152  BP: (!) 146/103  Pulse: 78  Resp: 20  Temp: (!) 97.2 F (36.2 C)  SpO2: 100%   Wt Readings from Last 3 Encounters:  08/16/20 204 lb (92.5 kg)  06/12/20 206 lb 6.4 oz (93.6 kg)  05/10/20 205 lb 14.6 oz (93.4 kg)   Body mass index is 31.95 kg/m.    ECOG FS:1 - Symptomatic but completely ambulatory  Hair is coming in fully with no deficit Sclerae unicteric, EOMs intact Wearing a mask No cervical or supraclavicular adenopathy Lungs no rales or rhonchi Heart regular rate and rhythm Abd soft, nontender, positive bowel sounds MSK no focal spinal tenderness, no upper extremity lymphedema Neuro: nonfocal, well oriented, appropriate affect Breasts: The right breast is status post lumpectomy and radiation.  The cosmetic result is good.  There is some hyperpigmentation but no peeling.  Left breast and both axillae are benign   LAB RESULTS:  CMP     Component Value Date/Time   NA 140  08/16/2020 1137   K 4.0 08/16/2020 1137   CL 107 08/16/2020 1137   CO2 25 08/16/2020 1137   GLUCOSE 100 (H) 08/16/2020 1137   BUN 11 08/16/2020 1137   CREATININE 0.84 08/16/2020 1137   CREATININE 0.95 10/17/2019 1529   CALCIUM 9.3 08/16/2020 1137   PROT 7.4 08/16/2020  1137   ALBUMIN 3.9 08/16/2020 1137   AST 23 08/16/2020 1137   AST 16 10/17/2019 1529   ALT 18 08/16/2020 1137   ALT 13 10/17/2019 1529   ALKPHOS 96 08/16/2020 1137   BILITOT 0.7 08/16/2020 1137   BILITOT 1.1 10/17/2019 1529   GFRNONAA >60 08/16/2020 1137   GFRNONAA >60 10/17/2019 1529   GFRAA >60 03/27/2020 1008   GFRAA >60 10/17/2019 1529    No results found for: TOTALPROTELP, ALBUMINELP, A1GS, A2GS, BETS, BETA2SER, GAMS, MSPIKE, SPEI  Lab Results  Component Value Date   WBC 5.2 08/16/2020   NEUTROABS 3.4 08/16/2020   HGB 12.1 08/16/2020   HCT 34.5 (L) 08/16/2020   MCV 72.9 (L) 08/16/2020   PLT 210 08/16/2020    No results found for: LABCA2  No components found for: [id number]  No results for input(s): INR in the last 168 hours.  No results found for: LABCA2  No results found for: DPO242  No results found for: PNT614  No results found for: ERX540  Lab Results  Component Value Date   CA2729 29.8 11/16/2019    No components found for: HGQUANT  No results found for: CEA1 / No results found for: CEA1   No results found for: AFPTUMOR  No results found for: CHROMOGRNA  No results found for: KPAFRELGTCHN, LAMBDASER, KAPLAMBRATIO (kappa/lambda light chains)  No results found for: HGBA, HGBA2QUANT, HGBFQUANT, HGBSQUAN (Hemoglobinopathy evaluation)   No results found for: LDH  Lab Results  Component Value Date   IRON 99 10/17/2019   TIBC 391 10/17/2019   IRONPCTSAT 25 10/17/2019   (Iron and TIBC)  Lab Results  Component Value Date   FERRITIN 45 10/17/2019    Urinalysis No results found for: COLORURINE, APPEARANCEUR, LABSPEC, PHURINE, GLUCOSEU, HGBUR, BILIRUBINUR, KETONESUR,  PROTEINUR, UROBILINOGEN, NITRITE, LEUKOCYTESUR   STUDIES: No results found.   ELIGIBLE FOR AVAILABLE RESEARCH PROTOCOL: G8676  ASSESSMENT: 45 y.o. [place] woman status post right breast upper inner quadrant biopsy 10/03/2019 for a clinical mT3 N1, stage IIIC invasive ductal carcinoma, grade 3, triple negative, with an MIB-1 of 80%  (a) biopsy of a right axillary node 2019-11-09 showed carcinoma  (b) CT chest/abd/pelvis 10/31/2019 shows 1.5 cm hypodense lesion in liver, lung nodules <0.3 cm  (c) bone scan 10/31/2019 negative  (d) liver MRI 11/11/2019 confirms a targetoid lesion in the left hepatic lobe suspicious for metastasis   (i) liver biopsy 2019-11-23 showed no evidence of malignancy (benign).  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 (p.Thr276Alafs*14)   (a) two variants of uncertain significance were noted - one in the [facility] gene called c.1295T>G and one in the POLD1 gene called c.34G>A  (b) no additional mutations of concern were found in AIP, ALK, APC, ATM, AXIN2,BAP1,  BARD1, BLM, BMPR1A, BRCA1, BRCA2, BRIP1, CASR, CDC73, CDH1, CDK4, CDKN1B, CDKN1C, CDKN2A (p14ARF), CDKN2A (p16INK4a), CEBPA, CHEK2, CTNNA1, DICER1, DIS3L2, EGFR (c.2369C>T, p.Thr790Met variant only), EPCAM (Deletion/duplication testing only), FH, FLCN, GATA2, GPC3, GREM1 (Promoter region deletion/duplication testing only), HOXB13 (c.251G>A, p.Gly84Glu), HRAS, KIT, MAX, MEN1, MET, MITF (c.952G>A, p.Glu318Lys variant only), MLH1, MSH2, MSH3, MSH6, MUTYH, NBN, NF1, NF2, NTHL1, PALB2, PDGFRA, PHOX2B, PMS2, POLD1, POLE, POT1, PRKAR1A, PTCH1, PTEN, RAD50, RAD51C, RAD51D, RB1, RECQL4, RET, RNF43, RUNX1, SDHAF2, SDHA (sequence changes only), SDHB, SDHC, SDHD, SMAD4, SMARCA4, SMARCB1, SMARCE1, STK11, SUFU, TERC, TERT, TMEM127, TP53, TSC1, TSC2, VHL, WRN and WT1.  (  2) neoadjuvant  chemotherapy consisting of cyclophosphamide and doxorubicin in dose dense fashion x4 started 11/16/2019 and completed 12/27/2019, followed by paclitaxel and carboplatin weekly x12 starting 01/10/2020  (a) echo 10/24/2019 shows an ejection fraction in the 60-65% range  (b) patient opted to discontinue paclitaxel/carboplatin treatments after 6 doses, last dose 02/14/2020  (3) status post right lumpectomy 04/05/2020 for a residual ypT2 ypN1 invasive ductal carcinoma, grade 3, with negative margins.  (a) the single removed right axillary lymph node was positive  (b) completion axillary lymph node dissection planned for 05/10/2020  (4) adjuvant radiation 06/19/2020 through 08/01/2020  (a) capecitabine sensitization prescribed but not started by the patient   (5) hemoglobin C trait:  (a) ferritin 10/17/2019 was 45 with saturation 25%, hemoglobin 11.8 and MCV 71.5  (b) hemoglobin electrophoresis 2019-11-22 showed: A: 62.6%, A2: 3.7%, C: 33.2%, F:0 5.1%, S: 0%  (6) post radiation treatment to consist   (a) capecitabine for 6 months at standard doses starting 08/17/2020  (b) olaparib to follow   (c) Keytruda to follow    PLAN: Krista [name] did generally well with her radiation treatment.  She preferred to do the radiation by itself and not accompanied by capecitabine.  She is now ready to start the capecitabine.  We had her down to start the pembrolizumab [name] but after [name]'s discussion what she wants to do is go ahead with 6 months of capecitabine and then consider pembrolizumab and olaparib.  We reviewed the possible toxicities side effects and complications of capecitabine and she will start tomorrow.  She will call with any unusual side effects and certainly if she develops severe diarrhea.  She will see me again on 09/06/2020, which is the day before starting cycle 2.  Once she completes the capecitabine we will start the olaparib.  We would like to also start the pembrolizumab at the same  time.  She knows to call for any other issue that may develop before the next visit  Total encounter time 30 minutes.  Krista [name]. Libero Puthoff, MD 08/16/20 12:22 PM Medical Oncology and Hematology [facility] [place], [place] [address] Tel. [phone]    Fax. [phone]   I, Wilburn Mylar, am acting as scribe for Dr. Virgie [name]. Terralyn Matsumura.  I, Lurline Del MD, have reviewed the above documentation for accuracy and completeness, and I agree with the above.   *Total Encounter Time as defined by the Centers for Medicare and Medicaid Services includes, in addition to the face-to-face time of a patient visit (documented in the note above) non-face-to-face time: obtaining and reviewing outside history, ordering and reviewing medications, tests or procedures, care coordination (communications with other health care professionals or caregivers) and documentation in the medical record.

## 2020-08-16 ENCOUNTER — Inpatient Hospital Stay: Payer: BLUE CROSS/BLUE SHIELD

## 2020-08-16 ENCOUNTER — Other Ambulatory Visit: Payer: Self-pay

## 2020-08-16 ENCOUNTER — Inpatient Hospital Stay: Payer: BLUE CROSS/BLUE SHIELD | Attending: Oncology | Admitting: Oncology

## 2020-08-16 ENCOUNTER — Encounter: Payer: Self-pay | Admitting: *Deleted

## 2020-08-16 VITALS — BP 146/103 | HR 78 | Temp 97.2°F | Resp 20 | Ht 67.0 in | Wt 204.0 lb

## 2020-08-16 DIAGNOSIS — Z801 Family history of malignant neoplasm of trachea, bronchus and lung: Secondary | ICD-10-CM | POA: Insufficient documentation

## 2020-08-16 DIAGNOSIS — Z923 Personal history of irradiation: Secondary | ICD-10-CM | POA: Insufficient documentation

## 2020-08-16 DIAGNOSIS — Z809 Family history of malignant neoplasm, unspecified: Secondary | ICD-10-CM | POA: Diagnosis not present

## 2020-08-16 DIAGNOSIS — Z7189 Other specified counseling: Secondary | ICD-10-CM

## 2020-08-16 DIAGNOSIS — C773 Secondary and unspecified malignant neoplasm of axilla and upper limb lymph nodes: Secondary | ICD-10-CM | POA: Insufficient documentation

## 2020-08-16 DIAGNOSIS — D582 Other hemoglobinopathies: Secondary | ICD-10-CM | POA: Insufficient documentation

## 2020-08-16 DIAGNOSIS — Z171 Estrogen receptor negative status [ER-]: Secondary | ICD-10-CM | POA: Diagnosis not present

## 2020-08-16 DIAGNOSIS — Z9221 Personal history of antineoplastic chemotherapy: Secondary | ICD-10-CM | POA: Insufficient documentation

## 2020-08-16 DIAGNOSIS — C50211 Malignant neoplasm of upper-inner quadrant of right female breast: Secondary | ICD-10-CM

## 2020-08-16 DIAGNOSIS — C50411 Malignant neoplasm of upper-outer quadrant of right female breast: Secondary | ICD-10-CM

## 2020-08-16 DIAGNOSIS — Z1501 Genetic susceptibility to malignant neoplasm of breast: Secondary | ICD-10-CM

## 2020-08-16 DIAGNOSIS — Z8041 Family history of malignant neoplasm of ovary: Secondary | ICD-10-CM | POA: Insufficient documentation

## 2020-08-16 DIAGNOSIS — D563 Thalassemia minor: Secondary | ICD-10-CM

## 2020-08-16 DIAGNOSIS — Z8049 Family history of malignant neoplasm of other genital organs: Secondary | ICD-10-CM | POA: Insufficient documentation

## 2020-08-16 DIAGNOSIS — Z1509 Genetic susceptibility to other malignant neoplasm: Secondary | ICD-10-CM

## 2020-08-16 DIAGNOSIS — Z803 Family history of malignant neoplasm of breast: Secondary | ICD-10-CM | POA: Diagnosis not present

## 2020-08-16 DIAGNOSIS — C50911 Malignant neoplasm of unspecified site of right female breast: Secondary | ICD-10-CM | POA: Diagnosis not present

## 2020-08-16 LAB — COMPREHENSIVE METABOLIC PANEL
ALT: 18 U/L (ref 0–44)
AST: 23 U/L (ref 15–41)
Albumin: 3.9 g/dL (ref 3.5–5.0)
Alkaline Phosphatase: 96 U/L (ref 38–126)
Anion gap: 8 (ref 5–15)
BUN: 11 mg/dL (ref 6–20)
CO2: 25 mmol/L (ref 22–32)
Calcium: 9.3 mg/dL (ref 8.9–10.3)
Chloride: 107 mmol/L (ref 98–111)
Creatinine, Ser: 0.84 mg/dL (ref 0.44–1.00)
GFR, Estimated: >60 mL/min (ref >60)
Glucose, Bld: 100 mg/dL — ABNORMAL HIGH (ref 70–99)
Potassium: 4 mmol/L (ref 3.5–5.1)
Sodium: 140 mmol/L (ref 135–145)
Total Bilirubin: 0.7 mg/dL (ref 0.3–1.2)
Total Protein: 7.4 g/dL (ref 6.5–8.1)

## 2020-08-16 LAB — CBC WITH DIFFERENTIAL/PLATELET
Abs Immature Granulocytes: 0.01 10*3/uL (ref 0.00–0.07)
Basophils Absolute: 0 10*3/uL (ref 0.0–0.1)
Basophils Relative: 1 %
Eosinophils Absolute: 0.2 10*3/uL (ref 0.0–0.5)
Eosinophils Relative: 4 %
HCT: 34.5 % — ABNORMAL LOW (ref 36.0–46.0)
Hemoglobin: 12.1 g/dL (ref 12.0–15.0)
Immature Granulocytes: 0 %
Lymphocytes Relative: 20 %
Lymphs Abs: 1.1 10*3/uL (ref 0.7–4.0)
MCH: 25.6 pg — ABNORMAL LOW (ref 26.0–34.0)
MCHC: 35.1 g/dL (ref 30.0–36.0)
MCV: 72.9 fL — ABNORMAL LOW (ref 80.0–100.0)
Monocytes Absolute: 0.5 10*3/uL (ref 0.1–1.0)
Monocytes Relative: 9 %
Neutro Abs: 3.4 10*3/uL (ref 1.7–7.7)
Neutrophils Relative %: 66 %
Platelets: 210 10*3/uL (ref 150–400)
RBC: 4.73 MIL/uL (ref 3.87–5.11)
RDW: 16.7 % — ABNORMAL HIGH (ref 11.5–15.5)
WBC: 5.2 10*3/uL (ref 4.0–10.5)
nRBC: 0 % (ref 0.0–0.2)

## 2020-08-17 ENCOUNTER — Telehealth: Payer: Self-pay | Admitting: Oncology

## 2020-08-17 NOTE — Telephone Encounter (Signed)
Scheduled appts per 2/17 los. Left voicemail with appt date and time.

## 2020-09-05 ENCOUNTER — Encounter: Payer: Self-pay | Admitting: Radiation Oncology

## 2020-09-05 ENCOUNTER — Ambulatory Visit
Admission: RE | Admit: 2020-09-05 | Discharge: 2020-09-05 | Disposition: A | Discharge status: Home / Self Care | Payer: PRIVATE HEALTH INSURANCE | Source: Ambulatory Visit | Attending: Radiation Oncology | Admitting: Radiation Oncology

## 2020-09-05 ENCOUNTER — Ambulatory Visit: Payer: PRIVATE HEALTH INSURANCE | Admitting: Radiation Oncology

## 2020-09-05 DIAGNOSIS — C50211 Malignant neoplasm of upper-inner quadrant of right female breast: Secondary | ICD-10-CM

## 2020-09-05 DIAGNOSIS — Z171 Estrogen receptor negative status [ER-]: Secondary | ICD-10-CM

## 2020-09-05 NOTE — Progress Notes (Signed)
Radiation Oncology         [phone] ________________________________  Name: Krista [name] MRN: [id number]  Date: 09/05/2020  DOB: 1975-07-20  Follow-Up Visit  The patient opted for telemedicine to maximize safety during the pandemic.  MyChart video was used  Outpatient  CC: Everett Graff, MD  Erroll Luna, MD  Diagnosis and Prior Radiotherapy:    ICD-10-CM   1. Malignant neoplasm of upper-inner quadrant of right breast in female, estrogen receptor negative ([place])  C50.211    Z17.1     CHIEF COMPLAINT: Here for follow-up and surveillance of breast cancer  Narrative:  The patient returns today for routine follow-up.  She is doing well.  Her skin has healed nicely.  She continues to moisturize it.  No concerns whatsoever regarding her healing from radiation therapy.  She is keeping busy with work and potting plants as a hobby.  She is on a pescatarian  diet.                              ALLERGIES:  is allergic to [facility] [fosaprepitant].  Meds: Current Outpatient Medications  Medication Sig Dispense Refill  . acetaminophen (TYLENOL) 500 MG tablet Take 1-2 tablets (500-1,000 mg total) by mouth every 6 (six) hours as needed. (Patient not taking: Reported on 06/05/2020) 60 tablet 0  . Biotin w/ Vitamins C & E (HAIR/SKIN/NAILS PO) Take 1 tablet by mouth daily.    . capecitabine (XELODA) 500 MG tablet Take 2 tablets (1,000 mg total) by mouth 2 (two) times daily after a meal. Take only on radiation days. (Patient not taking: Reported on 06/05/2020) 120 tablet 0  . Cholecalciferol (VITAMIN D3) 250 MCG (10000 UT) capsule Take 10,000 Units by mouth daily.    . ferrous gluconate (FERGON) 324 MG tablet Take 1 tablet (324 mg total) by mouth daily with breakfast. 100 tablet 3  . ibuprofen (ADVIL) 800 MG tablet Take 1 tablet (800 mg total) by mouth every 8 (eight) hours as needed. (Patient not taking: Reported on 06/05/2020) 30 tablet 0  . loratadine (CLARITIN) 10 MG tablet Take 1  tablet (10 mg total) by mouth daily. (Patient not taking: Reported on 03/22/2020) 60 tablet 0  . MILK THISTLE PO Take 1 capsule by mouth daily.    . Misc Natural Products [facility]) CAPS Take 2 capsules by mouth daily.    . Misc Natural Products (PHYTOCILLIN) CAPS Take 2 capsules by mouth daily.    Marland Kitchen OVER THE COUNTER MEDICATION Take 10 mLs by mouth 2 (two) times daily. floradix otc supplement    . OVER THE COUNTER MEDICATION Take 2 capsules by mouth daily. kyolic aged garlic otc supplement    . oxyCODONE (OXY IR/ROXICODONE) 5 MG immediate release tablet Take 1 tablet (5 mg total) by mouth every 6 (six) hours as needed for severe pain. (Patient not taking: Reported on 06/05/2020) 15 tablet 0  . valACYclovir (VALTREX) 1000 MG tablet TAKE 1 TABLET BY MOUTH EVERY DAY (Patient not taking: Reported on 03/22/2020) 90 tablet 1   No current facility-administered medications for this encounter.    Physical Findings: The patient is in no acute distress. Patient is alert and oriented.  vitals were not taken for this visit. .       Lab Findings: Lab Results  Component Value Date   WBC 5.2 08/16/2020   HGB 12.1 08/16/2020   HCT 34.5 (L) 08/16/2020   MCV 72.9 (L) 08/16/2020  PLT 210 08/16/2020    Radiographic Findings: No results found.  Impression/Plan: Healing well from radiotherapy to the breast tissue per patient report  Continue skin care with topical Vitamin E Oil and / or cocoa butter for at least 2 more months for further healing.  I encouraged her to continue with yearly mammography as appropriate (for intact breast tissue) and followup with medical oncology. I will see her back on an as-needed basis. I have encouraged her to call if she has any issues or concerns in the future. I wished her the very best.  This encounter was provided by telemedicine platform; patient desired telemedicine during pandemic precautions.  MyChart video was used. The patient has given verbal consent for  this type of encounter and has been advised to only accept a meeting of this type in a secure network environment. On date of service, in total, I spent 10 minutes on this encounter.   The attendants for this meeting include Eppie Gibson  and [name] Hendrix-Wilson During the encounter, Eppie Gibson was located at [facility] Radiation Oncology Department.  Leeester Hendrix-Wilson was located at home.  ________________________   Eppie Gibson, MD

## 2020-09-06 ENCOUNTER — Inpatient Hospital Stay: Payer: BLUE CROSS/BLUE SHIELD

## 2020-09-06 ENCOUNTER — Telehealth: Payer: Self-pay | Admitting: Oncology

## 2020-09-06 ENCOUNTER — Inpatient Hospital Stay: Payer: BLUE CROSS/BLUE SHIELD | Admitting: Oncology

## 2020-09-06 NOTE — Progress Notes (Incomplete)
[place]  [phone] [phone]     ID: Krista [name] DOB: [birth year]  MR#: [id number]  VOZ#:[id number]  Patient Care Team: Everett Graff, MD as PCP - General (Obstetrics and Gynecology) Magrinat, [name], MD as Consulting Physician (Oncology) Erroll Luna, MD as Consulting Physician (General Surgery) Eppie Gibson, MD as Attending Physician (Radiation Oncology) Rockwell Germany, RN as Oncology Nurse Navigator Mauro Kaufmann, RN as Oncology Nurse Navigator Aurea Graff OTHER MD:   CHIEF COMPLAINT: Triple negative breast cancer  CURRENT TREATMENT: Adjuvant capecitabine   INTERVAL HISTORY: Krista [name] returns today for follow-up of her triple negative breast cancer.   She started capecitabine on 08/17/2020.   REVIEW OF SYSTEMS: Krista [name]    COVID 19 VACCINATION STATUS: declines vaccine   HISTORY OF CURRENT ILLNESS: From the original intake note:  Krista [name] ["Krista [name]"] herself palpated a lump in the upper-inner right breast sometime late December 2020 or early January 2021.  As the mass grew larger and persisted through menstrual periods she brought it to medical attention and underwent bilateral diagnostic mammography with tomography and bilateral breast ultrasonography at [facility] on 09/26/2019 showing: breast density category C; 5.1 cm mass involving upper-inner quadrant of right breast; no evidence of right axillary lymphadenopathy or left breast malignancy.  Accordingly on 10/03/2019 she proceeded to biopsy of the right breast area in question. The pathology from this procedure ([id number]) showed: invasive ductal carcinoma, grade 3. Prognostic indicators significant for: estrogen receptor, 0% negative and progesterone receptor, 0% negative. Proliferation marker Ki67 at 80%. HER2 negative by immunohistochemistry (1+).  The patient's subsequent history is as detailed below.   PAST  MEDICAL HISTORY: Past Medical History:  Diagnosis Date  . Anemia    history of  . Cancer [facility])    right breast cancer 10/03/19, received chemotherapy  . Family history of breast cancer   . Family history of cervical cancer   . Family history of lung cancer   . Hemorrhoids     PAST SURGICAL HISTORY: Past Surgical History:  Procedure Laterality Date  . AXILLARY LYMPH NODE DISSECTION Right 05/10/2020   Procedure: RIGHT AXILLARY LYMPH NODE DISSECTION;  Surgeon: Erroll Luna, MD;  Location: [place];  Service: General;  Laterality: Right;  . BREAST LUMPECTOMY WITH RADIOACTIVE SEED AND SENTINEL LYMPH NODE BIOPSY Right 04/05/2020   Procedure: RIGHT BREAST BRACKETED LUMPECTOMY WITH RADIOACTIVE SEED AND RIGHT AXILLARY  TARGETED LYMPH NODE BIOPSY AND SENTINEL LYMPH NODE MAPPING;  Surgeon: Erroll Luna, MD;  Location: [place];  Service: General;  Laterality: Right;  PEC BLOCK  . IR IMAGING GUIDED PORT INSERTION  11/07/2019  . PORT-A-CATH REMOVAL Left 04/05/2020   Procedure: REMOVAL PORT-A-CATH;  Surgeon: Erroll Luna, MD;  Location: [facility];  Service: General;  Laterality: Left;    FAMILY HISTORY: Family History  Problem Relation Age of Onset  . Breast cancer Mother 55       double mastectomy  . Breast cancer Half-Sister 42  . Cirrhosis Half-Sister   . Heart Problems Father   . Stroke Father   . Breast cancer Maternal Aunt 63  . Cancer Maternal Uncle 81       unknown type  . Ovarian cancer Maternal Grandmother        dx. in her mid-40s  . Lung cancer Paternal Grandmother   . Cancer Other        unknown type; maternal great-uncles/aunts (x3)  . Diabetes Half-Brother   .  Cancer Other        unknown type; matenral great-uncles/aunt (x4)  . Cancer Maternal Great-grandfather        unknown type  . Breast cancer Cousin 26       paternal cousin  The patient's father died at age 85 from cardiac complications of drug use.  His mother, the patient's paternal grandmother  had what seems to have been cancer of the lung metastatic to the spine.  There was no other cancer on his side of the family to the patient's knowledge.  The patient's mother died at age 16 in the setting of multiple medical issues but she had undergone double mastectomy at the age of 73.  Her mother, the patient's maternal grandmother had cervical cancer.  The patient's mother had 3 brothers and 3 sisters.  1 of those 3 sisters had breast cancer diagnosed at the age of 87.  The patient herself has 2 brothers and 3 sisters one of her sisters was diagnosed with breast cancer at the age of 22 and died at the age of 23   GYNECOLOGIC HISTORY:  No LMP recorded. (Menstrual status: Chemotherapy). Menarche: 45 years old Age at first live birth: 45 years old [place] P 3 LMP regular, last approximately 5 days, of which the second day is heavy Contraceptive: Husband status post vasectomy HRT no  Hysterectomy? no BSO?  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  Her husband Krista [name] is a Research scientist (medical) and also Librarian, academic at [employer].  Their children are Krista [name], 20, who is Scientist, product/process development at [employer], 18, who will be going to [employer] this coming year and then switch over to [employer], [employer] [place] psychology and aiming for an art rehabilitation degree; and Krista [name], 33, currently attending [employer].  The patient is not a church attender    ADVANCED DIRECTIVES: In the absence of any documents to the contrary the patient's husband is her healthcare power of attorney   HEALTH MAINTENANCE: Social History   Tobacco Use  . Smoking status: Never Smoker  . Smokeless tobacco: Never Used  Vaping Use  . Vaping Use: Never used  Substance Use Topics  . Alcohol use: Not Currently  . Drug use: Never     Colonoscopy: n/a (age)  PAP: UTD  Bone density: n/a (age)   Allergies  Allergen Reactions  . Emend [Fosaprepitant] Shortness Of  Breath    Current Outpatient Medications  Medication Sig Dispense Refill  . acetaminophen (TYLENOL) 500 MG tablet Take 1-2 tablets (500-1,000 mg total) by mouth every 6 (six) hours as needed. (Patient not taking: Reported on 06/05/2020) 60 tablet 0  . Biotin w/ Vitamins C & E (HAIR/SKIN/NAILS PO) Take 1 tablet by mouth daily.    . capecitabine (XELODA) 500 MG tablet Take 2 tablets (1,000 mg total) by mouth 2 (two) times daily after a meal. Take only on radiation days. (Patient not taking: Reported on 06/05/2020) 120 tablet 0  . Cholecalciferol (VITAMIN D3) 250 MCG (10000 UT) capsule Take 10,000 Units by mouth daily.    . ferrous gluconate (FERGON) 324 MG tablet Take 1 tablet (324 mg total) by mouth daily with breakfast. 100 tablet 3  . ibuprofen (ADVIL) 800 MG tablet Take 1 tablet (800 mg total) by mouth every 8 (eight) hours as needed. (Patient not taking: Reported on 06/05/2020) 30 tablet 0  . loratadine (CLARITIN) 10 MG tablet Take 1 tablet (  10 mg total) by mouth daily. (Patient not taking: Reported on 03/22/2020) 60 tablet 0  . MILK THISTLE PO Take 1 capsule by mouth daily.    . Misc Natural Products [facility]) CAPS Take 2 capsules by mouth daily.    . Misc Natural Products (PHYTOCILLIN) CAPS Take 2 capsules by mouth daily.    Marland Kitchen OVER THE COUNTER MEDICATION Take 10 mLs by mouth 2 (two) times daily. floradix otc supplement    . OVER THE COUNTER MEDICATION Take 2 capsules by mouth daily. kyolic aged garlic otc supplement    . oxyCODONE (OXY IR/ROXICODONE) 5 MG immediate release tablet Take 1 tablet (5 mg total) by mouth every 6 (six) hours as needed for severe pain. (Patient not taking: Reported on 06/05/2020) 15 tablet 0  . valACYclovir (VALTREX) 1000 MG tablet TAKE 1 TABLET BY MOUTH EVERY DAY (Patient not taking: Reported on 03/22/2020) 90 tablet 1   No current facility-administered medications for this visit.    OBJECTIVE: African-American woman in no acute distress  There were no vitals  filed for this visit. Wt Readings from Last 3 Encounters:  08/16/20 204 lb (92.5 kg)  06/12/20 206 lb 6.4 oz (93.6 kg)  05/10/20 205 lb 14.6 oz (93.4 kg)   There is no height or weight on file to calculate BMI.    ECOG FS:1 - Symptomatic but completely ambulatory  Sclerae unicteric, EOMs intact Wearing a mask No cervical or supraclavicular adenopathy Lungs no rales or rhonchi Heart regular rate and rhythm Abd soft, nontender, positive bowel sounds MSK no focal spinal tenderness, no upper extremity lymphedema Neuro: nonfocal, well oriented, appropriate affect Breasts:    {Hair is coming in fully with no deficit Sclerae unicteric, EOMs intact Wearing a mask No cervical or supraclavicular adenopathy Lungs no rales or rhonchi Heart regular rate and rhythm Abd soft, nontender, positive bowel sounds MSK no focal spinal tenderness, no upper extremity lymphedema Neuro: nonfocal, well oriented, appropriate affect Breasts: The right breast is status post lumpectomy and radiation.  The cosmetic result is good.  There is some hyperpigmentation but no peeling.  Left breast and both axillae are benign}   LAB RESULTS:  CMP     Component Value Date/Time   NA 140 08/16/2020 1137   K 4.0 08/16/2020 1137   CL 107 08/16/2020 1137   CO2 25 08/16/2020 1137   GLUCOSE 100 (H) 08/16/2020 1137   BUN 11 08/16/2020 1137   CREATININE 0.84 08/16/2020 1137   CREATININE 0.95 10/17/2019 1529   CALCIUM 9.3 08/16/2020 1137   PROT 7.4 08/16/2020 1137   ALBUMIN 3.9 08/16/2020 1137   AST 23 08/16/2020 1137   AST 16 10/17/2019 1529   ALT 18 08/16/2020 1137   ALT 13 10/17/2019 1529   ALKPHOS 96 08/16/2020 1137   BILITOT 0.7 08/16/2020 1137   BILITOT 1.1 10/17/2019 1529   GFRNONAA >60 08/16/2020 1137   GFRNONAA >60 10/17/2019 1529   GFRAA >60 03/27/2020 1008   GFRAA >60 10/17/2019 1529    No results found for: TOTALPROTELP, ALBUMINELP, A1GS, A2GS, BETS, BETA2SER, GAMS, MSPIKE, SPEI  Lab  Results  Component Value Date   WBC 5.2 08/16/2020   NEUTROABS 3.4 08/16/2020   HGB 12.1 08/16/2020   HCT 34.5 (L) 08/16/2020   MCV 72.9 (L) 08/16/2020   PLT 210 08/16/2020    No results found for: LABCA2  No components found for: RDEYCX448  No results for input(s): INR in the last 168 hours.  No results found for: LABCA2  No results found for: CAN199  No results found for: TFT732  No results found for: KGU542  Lab Results  Component Value Date   CA2729 29.8 11/16/2019    No components found for: HGQUANT  No results found for: CEA1 / No results found for: CEA1   No results found for: AFPTUMOR  No results found for: CHROMOGRNA  No results found for: KPAFRELGTCHN, LAMBDASER, KAPLAMBRATIO (kappa/lambda light chains)  No results found for: HGBA, HGBA2QUANT, HGBFQUANT, HGBSQUAN (Hemoglobinopathy evaluation)   No results found for: LDH  Lab Results  Component Value Date   IRON 99 10/17/2019   TIBC 391 10/17/2019   IRONPCTSAT 25 10/17/2019   (Iron and TIBC)  Lab Results  Component Value Date   FERRITIN 45 10/17/2019    Urinalysis No results found for: COLORURINE, APPEARANCEUR, LABSPEC, PHURINE, GLUCOSEU, HGBUR, BILIRUBINUR, KETONESUR, PROTEINUR, UROBILINOGEN, NITRITE, LEUKOCYTESUR   STUDIES: No results found.   ELIGIBLE FOR AVAILABLE RESEARCH PROTOCOL: H0623  ASSESSMENT: 45 y.o. [place] woman status post right breast upper inner quadrant biopsy 10/03/2019 for a clinical mT3 N1, stage IIIC invasive ductal carcinoma, grade 3, triple negative, with an MIB-1 of 80%  (a) biopsy of a right axillary node 2019-11-09 showed carcinoma  (b) CT chest/abd/pelvis 10/31/2019 shows 1.5 cm hypodense lesion in liver, lung nodules <0.3 cm  (c) bone scan 10/31/2019 negative  (d) liver MRI 11/11/2019 confirms a targetoid lesion in the left hepatic lobe suspicious for metastasis   (i) liver biopsy 2019-11-23 showed no evidence of malignancy (benign).  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*14)   (a) two variants of uncertain significance were noted - one in the [facility] gene called c.1295T>G and one in the POLD1 gene called c.34G>A  (b) no additional mutations of concern were found in AIP, ALK, APC, ATM, AXIN2,BAP1,  BARD1, BLM, BMPR1A, BRCA1, BRCA2, BRIP1, CASR, CDC73, CDH1, CDK4, CDKN1B, CDKN1C, CDKN2A (p14ARF), CDKN2A (p16INK4a), CEBPA, CHEK2, CTNNA1, DICER1, DIS3L2, EGFR (c.2369C>T, p.Thr790Met variant only), EPCAM (Deletion/duplication testing only), FH, FLCN, GATA2, GPC3, GREM1 (Promoter region deletion/duplication testing only), HOXB13 (c.251G>A, p.Gly84Glu), HRAS, KIT, MAX, MEN1, MET, MITF (c.952G>A, p.Glu318Lys variant only), MLH1, MSH2, MSH3, MSH6, MUTYH, NBN, NF1, NF2, NTHL1, PALB2, PDGFRA, PHOX2B, PMS2, POLD1, POLE, POT1, PRKAR1A, PTCH1, PTEN, RAD50, RAD51C, RAD51D, RB1, RECQL4, RET, RNF43, RUNX1, SDHAF2, SDHA (sequence changes only), SDHB, SDHC, SDHD, SMAD4, SMARCA4, SMARCB1, SMARCE1, STK11, SUFU, TERC, TERT, TMEM127, TP53, TSC1, TSC2, VHL, WRN and WT1.  (2) neoadjuvant chemotherapy consisting of cyclophosphamide and doxorubicin in dose dense fashion x4 started 11/16/2019 and completed 12/27/2019, followed by paclitaxel and carboplatin weekly x12 starting 01/10/2020  (a) echo 10/24/2019 shows an ejection fraction in the 60-65% range  (b) patient opted to discontinue paclitaxel/carboplatin treatments after 6 doses, last dose 02/14/2020  (3) status post right lumpectomy 04/05/2020 for a residual ypT2 ypN1 invasive ductal carcinoma, grade 3, with negative margins.  (a) the single removed right axillary lymph node was positive  (b) completion axillary lymph node dissection planned for 05/10/2020  (4) adjuvant radiation 06/19/2020 through 08/01/2020  (a) capecitabine sensitization prescribed but  not started by the patient   (5) hemoglobin C trait:  (a) ferritin 10/17/2019 was 45 with saturation 25%, hemoglobin 11.8 and MCV 71.5  (b) hemoglobin electrophoresis 2019-11-22 showed: A: 62.6%, A2: 3.7%, C: 33.2%, F:0 5.1%, S: 0%  (6) post radiation treatment to consist   (a) capecitabine  for 6 months at standard doses starting 08/17/2020  (b) olaparib to follow   (c) Keytruda to follow    PLAN: [name] did generally well with her radiation treatment.  She preferred to do the radiation by itself and not accompanied by capecitabine.  She is now ready to start the capecitabine.  We had her down to start the pembrolizumab today but after today's discussion what she wants to do is go ahead with 6 months of capecitabine and then consider pembrolizumab and olaparib.  We reviewed the possible toxicities side effects and complications of capecitabine and she will start tomorrow.  She will call with any unusual side effects and certainly if she develops severe diarrhea.  She will see me again on 09/06/2020, which is the day before starting cycle 2.  Once she completes the capecitabine we will start the olaparib.  We would like to also start the pembrolizumab at the same time.  She knows to call for any other issue that may develop before the next visit  Total encounter time 30 minutes.   [name]. Magrinat, MD 09/06/20 12:14 AM Medical Oncology and Hematology [facility] [place], [place] [address] Tel. [phone]    Fax. [phone]   I, Wilburn Mylar, am acting as scribe for Dr. [name]. Magrinat.  I, Lurline Del MD, have reviewed the above documentation for accuracy and completeness, and I agree with the above.   *Total Encounter Time as defined by the Centers for Medicare and Medicaid Services includes, in addition to the face-to-face time of a patient visit (documented in the note above) non-face-to-face time: obtaining and reviewing  outside history, ordering and reviewing medications, tests or procedures, care coordination (communications with other health care professionals or caregivers) and documentation in the medical record.

## 2020-09-06 NOTE — Telephone Encounter (Signed)
Rescheduled today's appointment per 3/10 schedule message. Left message to call back to reschedule if needed.

## 2020-09-09 NOTE — Progress Notes (Signed)
[place]  Telephone:(336) [phone] Fax:(336) [phone]     ID: Krista [name] DOB: 01-04-1976  MR#: [id number]  GEX#:[id number]  Patient Care Team: Everett Graff, MD as PCP - General (Obstetrics and Gynecology) Chirstine Defrain, [name], MD as Consulting Physician (Oncology) Erroll Luna, MD as Consulting Physician (General Surgery) Eppie Gibson, MD as Attending Physician (Radiation Oncology) Rockwell Germany, RN as Oncology Nurse Navigator Mauro Kaufmann, RN as Oncology Nurse Navigator Aurea Graff OTHER MD:   CHIEF COMPLAINT: Triple negative breast cancer  CURRENT TREATMENT:    INTERVAL HISTORY: Krista [name] was scheduled today for follow-up of her triple negative breast cancer.  However she did not show     REVIEW OF SYSTEMS: Krista [name]    COVID 19 VACCINATION STATUS: declines vaccine   HISTORY OF CURRENT ILLNESS: From the original intake note:  Krista [name] ["Krista [name]"] herself palpated a lump in the upper-inner right breast sometime late December 2020 or early January 2021.  As the mass grew larger and persisted through menstrual periods she brought it to medical attention and underwent bilateral diagnostic mammography with tomography and bilateral breast ultrasonography at [facility] on 09/26/2019 showing: breast density category C; 5.1 cm mass involving upper-inner quadrant of right breast; no evidence of right axillary lymphadenopathy or left breast malignancy.  Accordingly on 10/03/2019 she proceeded to biopsy of the right breast area in question. The pathology from this procedure ([id number]) showed: invasive ductal carcinoma, grade 3. Prognostic indicators significant for: estrogen receptor, 0% negative and progesterone receptor, 0% negative. Proliferation marker Ki67 at 80%. HER2 negative by immunohistochemistry (1+).  The patient's subsequent history is as detailed below.   PAST MEDICAL HISTORY: Past Medical  History:  Diagnosis Date  . Anemia    history of  . Cancer [facility])    right breast cancer 10/03/19, received chemotherapy  . Family history of breast cancer   . Family history of cervical cancer   . Family history of lung cancer   . Hemorrhoids     PAST SURGICAL HISTORY: Past Surgical History:  Procedure Laterality Date  . AXILLARY LYMPH NODE DISSECTION Right 05/10/2020   Procedure: RIGHT AXILLARY LYMPH NODE DISSECTION;  Surgeon: Erroll Luna, MD;  Location: [place];  Service: General;  Laterality: Right;  . BREAST LUMPECTOMY WITH RADIOACTIVE SEED AND SENTINEL LYMPH NODE BIOPSY Right 04/05/2020   Procedure: RIGHT BREAST BRACKETED LUMPECTOMY WITH RADIOACTIVE SEED AND RIGHT AXILLARY  TARGETED LYMPH NODE BIOPSY AND SENTINEL LYMPH NODE MAPPING;  Surgeon: Erroll Luna, MD;  Location: [place];  Service: General;  Laterality: Right;  PEC BLOCK  . IR IMAGING GUIDED PORT INSERTION  11/07/2019  . PORT-A-CATH REMOVAL Left 04/05/2020   Procedure: REMOVAL PORT-A-CATH;  Surgeon: Erroll Luna, MD;  Location: [facility];  Service: General;  Laterality: Left;    FAMILY HISTORY: Family History  Problem Relation Age of Onset  . Breast cancer Mother 4       double mastectomy  . Breast cancer Half-Sister 82  . Cirrhosis Half-Sister   . Heart Problems Father   . Stroke Father   . Breast cancer Maternal Aunt 73  . Cancer Maternal Uncle 43       unknown type  . Ovarian cancer Maternal Grandmother        dx. in her mid-40s  . Lung cancer Paternal Grandmother   . Cancer Other        unknown type; maternal great-uncles/aunts (x3)  . Diabetes Half-Brother   .  Cancer Other        unknown type; matenral great-uncles/aunt (x4)  . Cancer Maternal Great-grandfather        unknown type  . Breast cancer Cousin 9       paternal cousin  The patient's father died at age 31 from cardiac complications of drug use.  His mother, the patient's paternal grandmother had what seems to have been  cancer of the lung metastatic to the spine.  There was no other cancer on his side of the family to the patient's knowledge.  The patient's mother died at age 68 in the setting of multiple medical issues but she had undergone double mastectomy at the age of 37.  Her mother, the patient's maternal grandmother had cervical cancer.  The patient's mother had 3 brothers and 3 sisters.  1 of those 3 sisters had breast cancer diagnosed at the age of 59.  The patient herself has 2 brothers and 3 sisters one of her sisters was diagnosed with breast cancer at the age of 18 and died at the age of 48   GYNECOLOGIC HISTORY:  No LMP recorded. (Menstrual status: Chemotherapy). Menarche: 45 years old Age at first live birth: 45 years old [place] P 3 LMP regular, last approximately 5 days, of which the second day is heavy Contraceptive: Husband status post vasectomy HRT no  Hysterectomy? no BSO?  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  Her husband Krista [name] is a Research scientist (medical) and also Librarian, academic at [employer].  Their children are Krista [name], 20, who is Scientist, product/process development at [employer], 18, who will be going to [employer] this coming year and then switch over to [employer], [place] [employer] and aiming for an art rehabilitation degree; and Krista [name], 86, currently attending [employer].  The patient is not a church attender    ADVANCED DIRECTIVES: In the absence of any documents to the contrary the patient's husband is her healthcare power of attorney   HEALTH MAINTENANCE: Social History   Tobacco Use  . Smoking status: Never Smoker  . Smokeless tobacco: Never Used  Vaping Use  . Vaping Use: Never used  Substance Use Topics  . Alcohol use: Not Currently  . Drug use: Never     Colonoscopy: n/a (age)  PAP: UTD  Bone density: n/a (age)   Allergies  Allergen Reactions  . Emend [Fosaprepitant] Shortness Of Breath    Current Outpatient  Medications  Medication Sig Dispense Refill  . acetaminophen (TYLENOL) 500 MG tablet Take 1-2 tablets (500-1,000 mg total) by mouth every 6 (six) hours as needed. (Patient not taking: Reported on 06/05/2020) 60 tablet 0  . Biotin w/ Vitamins C & E (HAIR/SKIN/NAILS PO) Take 1 tablet by mouth daily.    . capecitabine (XELODA) 500 MG tablet Take 2 tablets (1,000 mg total) by mouth 2 (two) times daily after a meal. Take only on radiation days. (Patient not taking: Reported on 06/05/2020) 120 tablet 0  . Cholecalciferol (VITAMIN D3) 250 MCG (10000 UT) capsule Take 10,000 Units by mouth daily.    . ferrous gluconate (FERGON) 324 MG tablet Take 1 tablet (324 mg total) by mouth daily with breakfast. 100 tablet 3  . ibuprofen (ADVIL) 800 MG tablet Take 1 tablet (800 mg total) by mouth every 8 (eight) hours as needed. (Patient not taking: Reported on 06/05/2020) 30 tablet 0  . loratadine (CLARITIN) 10 MG tablet Take 1 tablet (  10 mg total) by mouth daily. (Patient not taking: Reported on 03/22/2020) 60 tablet 0  . MILK THISTLE PO Take 1 capsule by mouth daily.    . Misc Natural Products [facility]) CAPS Take 2 capsules by mouth daily.    . Misc Natural Products (PHYTOCILLIN) CAPS Take 2 capsules by mouth daily.    Marland Kitchen OVER THE COUNTER MEDICATION Take 10 mLs by mouth 2 (two) times daily. floradix otc supplement    . OVER THE COUNTER MEDICATION Take 2 capsules by mouth daily. kyolic aged garlic otc supplement    . oxyCODONE (OXY IR/ROXICODONE) 5 MG immediate release tablet Take 1 tablet (5 mg total) by mouth every 6 (six) hours as needed for severe pain. (Patient not taking: Reported on 06/05/2020) 15 tablet 0  . valACYclovir (VALTREX) 1000 MG tablet TAKE 1 TABLET BY MOUTH EVERY DAY (Patient not taking: Reported on 03/22/2020) 90 tablet 1   No current facility-administered medications for this visit.    OBJECTIVE:   There were no vitals filed for this visit. Wt Readings from Last 3 Encounters:  08/16/20 204 lb  (92.5 kg)  06/12/20 206 lb 6.4 oz (93.6 kg)  05/10/20 205 lb 14.6 oz (93.4 kg)   There is no height or weight on file to calculate BMI.       LAB RESULTS:  CMP     Component Value Date/Time   NA 140 08/16/2020 1137   K 4.0 08/16/2020 1137   CL 107 08/16/2020 1137   CO2 25 08/16/2020 1137   GLUCOSE 100 (H) 08/16/2020 1137   BUN 11 08/16/2020 1137   CREATININE 0.84 08/16/2020 1137   CREATININE 0.95 10/17/2019 1529   CALCIUM 9.3 08/16/2020 1137   PROT 7.4 08/16/2020 1137   ALBUMIN 3.9 08/16/2020 1137   AST 23 08/16/2020 1137   AST 16 10/17/2019 1529   ALT 18 08/16/2020 1137   ALT 13 10/17/2019 1529   ALKPHOS 96 08/16/2020 1137   BILITOT 0.7 08/16/2020 1137   BILITOT 1.1 10/17/2019 1529   GFRNONAA >60 08/16/2020 1137   GFRNONAA >60 10/17/2019 1529   GFRAA >60 03/27/2020 1008   GFRAA >60 10/17/2019 1529    No results found for: TOTALPROTELP, ALBUMINELP, A1GS, A2GS, BETS, BETA2SER, GAMS, MSPIKE, SPEI  Lab Results  Component Value Date   WBC 5.2 08/16/2020   NEUTROABS 3.4 08/16/2020   HGB 12.1 08/16/2020   HCT 34.5 (L) 08/16/2020   MCV 72.9 (L) 08/16/2020   PLT 210 08/16/2020    No results found for: LABCA2  No components found for: [id number]  No results for input(s): INR in the last 168 hours.  No results found for: LABCA2  No results found for: JJH417  No results found for: EYC144  No results found for: YJE563  Lab Results  Component Value Date   CA2729 29.8 11/16/2019    No components found for: HGQUANT  No results found for: CEA1 / No results found for: CEA1   No results found for: AFPTUMOR  No results found for: CHROMOGRNA  No results found for: KPAFRELGTCHN, LAMBDASER, KAPLAMBRATIO (kappa/lambda light chains)  No results found for: HGBA, HGBA2QUANT, HGBFQUANT, HGBSQUAN (Hemoglobinopathy evaluation)   No results found for: LDH  Lab Results  Component Value Date   IRON 99 10/17/2019   TIBC 391 10/17/2019   IRONPCTSAT 25 10/17/2019    (Iron and TIBC)  Lab Results  Component Value Date   FERRITIN 45 10/17/2019    Urinalysis No results found for: COLORURINE, APPEARANCEUR, LABSPEC,  PHURINE, GLUCOSEU, HGBUR, BILIRUBINUR, KETONESUR, PROTEINUR, UROBILINOGEN, NITRITE, LEUKOCYTESUR   STUDIES: No results found.   ELIGIBLE FOR AVAILABLE RESEARCH PROTOCOL: Y0737  ASSESSMENT: 45 y.o. [place] woman status post right breast upper inner quadrant biopsy 10/03/2019 for a clinical mT3 N1, stage IIIC invasive ductal carcinoma, grade 3, triple negative, with an MIB-1 of 80%  (a) biopsy of a right axillary node 2019-11-09 showed carcinoma  (b) CT chest/abd/pelvis 10/31/2019 shows 1.5 cm hypodense lesion in liver, lung nodules <0.3 cm  (c) bone scan 10/31/2019 negative  (d) liver MRI 11/11/2019 confirms a targetoid lesion in the left hepatic lobe suspicious for metastasis   (i) liver biopsy 2019-11-23 showed no evidence of malignancy (benign).  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*14)   (a) two variants of uncertain significance were noted - one in the [facility] gene called c.1295T>G and one in the POLD1 gene called c.34G>A  (b) no additional mutations of concern were found in AIP, ALK, APC, ATM, AXIN2,BAP1,  BARD1, BLM, BMPR1A, BRCA1, BRCA2, BRIP1, CASR, CDC73, CDH1, CDK4, CDKN1B, CDKN1C, CDKN2A (p14ARF), CDKN2A (p16INK4a), CEBPA, CHEK2, CTNNA1, DICER1, DIS3L2, EGFR (c.2369C>T, p.Thr790Met variant only), EPCAM (Deletion/duplication testing only), FH, FLCN, GATA2, GPC3, GREM1 (Promoter region deletion/duplication testing only), HOXB13 (c.251G>A, p.Gly84Glu), HRAS, KIT, MAX, MEN1, MET, MITF (c.952G>A, p.Glu318Lys variant only), MLH1, MSH2, MSH3, MSH6, MUTYH, NBN, NF1, NF2, NTHL1, PALB2, PDGFRA, PHOX2B, PMS2, POLD1, POLE, POT1, PRKAR1A, PTCH1, PTEN, RAD50, RAD51C, RAD51D,  RB1, RECQL4, RET, RNF43, RUNX1, SDHAF2, SDHA (sequence changes only), SDHB, SDHC, SDHD, SMAD4, SMARCA4, SMARCB1, SMARCE1, STK11, SUFU, TERC, TERT, TMEM127, TP53, TSC1, TSC2, VHL, WRN and WT1.  (2) neoadjuvant chemotherapy consisting of cyclophosphamide and doxorubicin in dose dense fashion x4 started 11/16/2019 and completed 12/27/2019, followed by paclitaxel and carboplatin weekly x12 starting 01/10/2020  (a) echo 10/24/2019 shows an ejection fraction in the 60-65% range  (b) patient opted to discontinue paclitaxel/carboplatin treatments after 6 doses, last dose 02/14/2020  (3) status post right lumpectomy 04/05/2020 for a residual ypT2 ypN1 invasive ductal carcinoma, grade 3, with negative margins.  (a) the single removed right axillary lymph node was positive  (b) completion axillary lymph node dissection planned for 05/10/2020  (4) adjuvant radiation 06/19/2020 through 08/01/2020  (a) capecitabine sensitization prescribed but not started by the patient   (5) hemoglobin C trait:  (a) ferritin 10/17/2019 was 45 with saturation 25%, hemoglobin 11.8 and MCV 71.5  (b) hemoglobin electrophoresis 2019-11-22 showed: A: 62.6%, A2: 3.7%, C: 33.2%, F:0 5.1%, S: 0%  (6) post radiation treatment to consist   (a) capecitabine for 6 months at standard doses starting 08/17/2020  (b) olaparib to follow   (c) Keytruda to follow    PLAN: The patient did not show for her 09/10/2020 visit.  A follow-up letter has been sent   Sarajane Jews C. Jarica Plass, MD 09/09/20 1:35 PM Medical Oncology and Hematology [facility] [place], [place] [address] Tel. [phone]    Fax. [phone]   I, Wilburn Mylar, am acting as scribe for Dr. [name]. Royce Stegman.  I, Lurline Del MD, have reviewed the above documentation for accuracy and completeness, and I agree with the above.   *Total Encounter Time as defined by the Centers for Medicare and Medicaid Services includes, in addition  to the face-to-face time of a patient visit (documented in the note above) non-face-to-face time: obtaining and reviewing outside  history, ordering and reviewing medications, tests or procedures, care coordination (communications with other health care professionals or caregivers) and documentation in the medical record.

## 2020-09-10 ENCOUNTER — Inpatient Hospital Stay (HOSPITAL_BASED_OUTPATIENT_CLINIC_OR_DEPARTMENT_OTHER): Payer: BLUE CROSS/BLUE SHIELD | Admitting: Oncology

## 2020-09-10 ENCOUNTER — Inpatient Hospital Stay: Payer: BLUE CROSS/BLUE SHIELD | Attending: Oncology

## 2020-09-10 ENCOUNTER — Encounter: Payer: Self-pay | Admitting: Oncology

## 2020-09-10 DIAGNOSIS — Z171 Estrogen receptor negative status [ER-]: Secondary | ICD-10-CM

## 2020-09-10 DIAGNOSIS — C50211 Malignant neoplasm of upper-inner quadrant of right female breast: Secondary | ICD-10-CM | POA: Insufficient documentation

## 2020-09-10 DIAGNOSIS — C773 Secondary and unspecified malignant neoplasm of axilla and upper limb lymph nodes: Secondary | ICD-10-CM | POA: Insufficient documentation

## 2020-09-10 DIAGNOSIS — Z923 Personal history of irradiation: Secondary | ICD-10-CM | POA: Insufficient documentation

## 2020-09-17 NOTE — Progress Notes (Signed)
  Patient Name: Krista [name] MRN: [id number] DOB: Jun 16, 1976 Referring Physician: Erroll Luna (Profile Not Attached) Date of Service: 08/03/2020 [facility], [place]                                                        End Of Treatment Note  Diagnoses: C50.211-Malignant neoplasm of upper-inner quadrant of right female breast  Cancer Staging: Cancer Staging Malignant neoplasm of upper-inner quadrant of right breast in female, estrogen receptor negative ([place]) Staging form: Breast, AJCC 8th Edition - Clinical: Stage IIIB (cT3, cN0, cM0, G3, ER-, PR-, HER2-) - Signed by Chauncey Cruel, MD on 10/17/2019 Histologic grading system: 3 grade system - Pathologic stage from 06/05/2020: No Stage Recommended (ypT2, pN1a, cM0, G3, ER-, PR-, HER2-) - Signed by Eppie Gibson, MD on 06/06/2020 Stage prefix: Post-therapy Histologic grading system: 3 grade system  Intent: Curative  Radiation Treatment Dates: 06/19/2020 through 08/03/2020 Site Technique Total Dose (Gy) Dose per Fx (Gy) Completed Fx Beam Energies  Breast, Right: Breast_Rt_IMN 3D 50/50 2 25/25 15X  Breast, Right: Breast_Rt_PAB_SCV 3D 50/50 2 25/25 15X  Breast, Right: Breast_Rt_Bst 3D 10/10 2 5/5 6X, 10X   Narrative: The patient tolerated radiation therapy relatively well.   Plan: The patient will follow-up with radiation oncology in 73mo.  -----------------------------------  Eppie Gibson, MD

## 2020-09-19 ENCOUNTER — Other Ambulatory Visit (HOSPITAL_BASED_OUTPATIENT_CLINIC_OR_DEPARTMENT_OTHER): Payer: Self-pay

## 2020-09-24 ENCOUNTER — Telehealth: Payer: Self-pay | Admitting: Oncology

## 2020-09-24 ENCOUNTER — Encounter: Payer: Self-pay | Admitting: Oncology

## 2020-09-24 NOTE — Progress Notes (Signed)
[place]  [phone] [phone]     ID: Krista [name] DOB: 06-10-1976  MR#: [id number]  IOX#:[id number]  Patient Care Team: Everett Graff, MD as PCP - General (Obstetrics and Gynecology) Cedrica Brune, [name], MD as Consulting Physician (Oncology) Erroll Luna, MD as Consulting Physician (General Surgery) Eppie Gibson, MD as Attending Physician (Radiation Oncology) Rockwell Germany, RN as Oncology Nurse Navigator Mauro Kaufmann, RN as Oncology Nurse Navigator Chauncey Cruel, MD OTHER MD:   CHIEF COMPLAINT: Triple negative breast cancer  CURRENT TREATMENT: Adjuvant capecitabine   INTERVAL HISTORY: Krista [name] returns today for follow-up of her triple negative breast cancer.  She did not show for her 09/10/2020 appointment but tells me she started capecitabine she thinks on March 17.  The prescription was for 14 days but she has run out of medication 3 days early so there may be some error somewhere.  The good news is that she is tolerating it with no diarrhea no mouth sores and no palmar plantar erythrodysesthesia.   REVIEW OF SYSTEMS: Krista [name] tells me she is a little bit crampy in the morning but after she walks a little bit the stiffness quickly disappears.  She is walking for exercise.  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  A detailed review of systems was otherwise stable   COVID 19 VACCINATION STATUS: declines vaccine   HISTORY OF CURRENT ILLNESS: From the original intake note:  Krista [name] ["Krista [name]"] herself palpated a lump in the upper-inner right breast sometime late December 2020 or early January 2021.  As the mass grew larger and persisted through menstrual periods she brought it to medical attention and underwent bilateral diagnostic mammography with tomography and bilateral breast ultrasonography at [facility] on 09/26/2019  showing: breast density category C; 5.1 cm mass involving upper-inner quadrant of right breast; no evidence of right axillary lymphadenopathy or left breast malignancy.  Accordingly on 10/03/2019 she proceeded to biopsy of the right breast area in question. The pathology from this procedure ([id number]) showed: invasive ductal carcinoma, grade 3. Prognostic indicators significant for: estrogen receptor, 0% negative and progesterone receptor, 0% negative. Proliferation marker Ki67 at 80%. HER2 negative by immunohistochemistry (1+).  The patient's subsequent history is as detailed below.   PAST MEDICAL HISTORY: Past Medical History:  Diagnosis Date  . Anemia    history of  . Cancer [facility])    right breast cancer 10/03/19, received chemotherapy  . Family history of breast cancer   . Family history of cervical cancer   . Family history of lung cancer   . Hemorrhoids     PAST SURGICAL HISTORY: Past Surgical History:  Procedure Laterality Date  . AXILLARY LYMPH NODE DISSECTION Right 05/10/2020   Procedure: RIGHT AXILLARY LYMPH NODE DISSECTION;  Surgeon: Erroll Luna, MD;  Location: [place];  Service: General;  Laterality: Right;  . BREAST LUMPECTOMY WITH RADIOACTIVE SEED AND SENTINEL LYMPH NODE BIOPSY Right 04/05/2020   Procedure: RIGHT BREAST BRACKETED LUMPECTOMY WITH RADIOACTIVE SEED AND RIGHT AXILLARY  TARGETED LYMPH NODE BIOPSY AND SENTINEL LYMPH NODE MAPPING;  Surgeon: Erroll Luna, MD;  Location: [employer];  Service: General;  Laterality: Right;  PEC BLOCK  . IR IMAGING GUIDED PORT INSERTION  11/07/2019  . PORT-A-CATH REMOVAL Left 04/05/2020   Procedure: REMOVAL PORT-A-CATH;  Surgeon: Erroll Luna, MD;  Location: [place];  Service: General;  Laterality: Left;  FAMILY HISTORY: Family History  Problem Relation Age of Onset  . Breast cancer Mother 76       double mastectomy  . Breast cancer Half-Sister 86  . Cirrhosis Half-Sister   . Heart Problems Father   . Stroke  Father   . Breast cancer Maternal Aunt 72  . Cancer Maternal Uncle 4       unknown type  . Ovarian cancer Maternal Grandmother        dx. in her mid-40s  . Lung cancer Paternal Grandmother   . Cancer Other        unknown type; maternal great-uncles/aunts (x3)  . Diabetes Half-Brother   . Cancer Other        unknown type; matenral great-uncles/aunt (x4)  . Cancer Maternal Great-grandfather        unknown type  . Breast cancer Cousin 53       paternal cousin  The patient's father died at age 47 from cardiac complications of drug use.  His mother, the patient's paternal grandmother had what seems to have been cancer of the lung metastatic to the spine.  There was no other cancer on his side of the family to the patient's knowledge.  The patient's mother died at age 47 in the setting of multiple medical issues but she had undergone double mastectomy at the age of 64.  Her mother, the patient's maternal grandmother had cervical cancer.  The patient's mother had 3 brothers and 3 sisters.  1 of those 3 sisters had breast cancer diagnosed at the age of 9.  The patient herself has 2 brothers and 3 sisters one of her sisters was diagnosed with breast cancer at the age of 35 and died at the age of 70   GYNECOLOGIC HISTORY:  No LMP recorded. (Menstrual status: Chemotherapy). Menarche: 45 years old Age at first live birth: 45 years old [place] P 3 LMP regular, last approximately 5 days, of which the second day is heavy Contraceptive: Husband status post vasectomy HRT no  Hysterectomy? no BSO?  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  Her husband Krista [name] is a Research scientist (medical) and also Librarian, academic at [employer].  Their children are Krista [name], 20, who is Scientist, product/process development at [employer], 18, who will be going to [employer] this coming year and then switch over to [employer], [employer] and aiming for an art rehabilitation  degree; and Krista [name], 18, currently attending [employer].  The patient is not a church attender    ADVANCED DIRECTIVES: In the absence of any documents to the contrary the patient's husband is her healthcare power of attorney   HEALTH MAINTENANCE: Social History   Tobacco Use  . Smoking status: Never Smoker  . Smokeless tobacco: Never Used  Vaping Use  . Vaping Use: Never used  Substance Use Topics  . Alcohol use: Not Currently  . Drug use: Never     Colonoscopy: n/a (age)  PAP: UTD  Bone density: n/a (age)   Allergies  Allergen Reactions  . Emend [Fosaprepitant] Shortness Of Breath    Current Outpatient Medications  Medication Sig Dispense Refill  . acetaminophen (TYLENOL) 500 MG tablet Take 1-2 tablets (500-1,000 mg total) by mouth every 6 (six) hours as needed. (Patient not taking: Reported on 06/05/2020) 60 tablet 0  . Biotin w/ Vitamins C & E (HAIR/SKIN/NAILS PO) Take 1 tablet by mouth daily.    Marland Kitchen  capecitabine (XELODA) 500 MG tablet Take 2 tablets (1,000 mg total) by mouth 2 (two) times daily after a meal. Take only on radiation days. (Patient not taking: Reported on 06/05/2020) 120 tablet 0  . Cholecalciferol (VITAMIN D3) 250 MCG (10000 UT) capsule Take 10,000 Units by mouth daily.    . ferrous gluconate (FERGON) 324 MG tablet Take 1 tablet (324 mg total) by mouth daily with breakfast. 100 tablet 3  . ibuprofen (ADVIL) 800 MG tablet Take 1 tablet (800 mg total) by mouth every 8 (eight) hours as needed. (Patient not taking: Reported on 06/05/2020) 30 tablet 0  . loratadine (CLARITIN) 10 MG tablet Take 1 tablet (10 mg total) by mouth daily. (Patient not taking: Reported on 03/22/2020) 60 tablet 0  . MILK THISTLE PO Take 1 capsule by mouth daily.    . Misc Natural Products [facility]) CAPS Take 2 capsules by mouth daily.    . Misc Natural Products (PHYTOCILLIN) CAPS Take 2 capsules by mouth daily.    Marland Kitchen OVER THE COUNTER MEDICATION Take 10 mLs by mouth 2 (two) times daily. floradix  otc supplement    . OVER THE COUNTER MEDICATION Take 2 capsules by mouth daily. kyolic aged garlic otc supplement    . oxyCODONE (OXY IR/ROXICODONE) 5 MG immediate release tablet Take 1 tablet (5 mg total) by mouth every 6 (six) hours as needed for severe pain. (Patient not taking: Reported on 06/05/2020) 15 tablet 0  . valACYclovir (VALTREX) 1000 MG tablet TAKE 1 TABLET BY MOUTH EVERY DAY (Patient not taking: Reported on 03/22/2020) 90 tablet 1   No current facility-administered medications for this visit.    OBJECTIVE: African-American woman in no acute distress  Vitals:   09/25/20 1018  BP: (!) 127/99  Pulse: 82  Resp: 18  Temp: (!) 97.4 F (36.3 C)  SpO2: 100%   Wt Readings from Last 3 Encounters:  09/25/20 206 lb 6.4 oz (93.6 kg)  08/16/20 204 lb (92.5 kg)  06/12/20 206 lb 6.4 oz (93.6 kg)   Body mass index is 32.33 kg/m.    Sclerae unicteric, EOMs intact Wearing a mask No cervical or supraclavicular adenopathy Lungs no rales or rhonchi Heart regular rate and rhythm Abd soft, nontender, positive bowel sounds MSK no focal spinal tenderness, no upper extremity lymphedema Neuro: nonfocal, well oriented, appropriate affect Breasts: The right breast is status post lumpectomy and radiation.  There is no evidence of local recurrence.  The left breast is benign.  Both axillae are benign.   LAB RESULTS:  CMP     Component Value Date/Time   NA 142 09/25/2020 0945   K 4.1 09/25/2020 0945   CL 106 09/25/2020 0945   CO2 26 09/25/2020 0945   GLUCOSE 99 09/25/2020 0945   BUN 9 09/25/2020 0945   CREATININE 0.84 09/25/2020 0945   CREATININE 0.95 10/17/2019 1529   CALCIUM 9.2 09/25/2020 0945   PROT 7.4 09/25/2020 0945   ALBUMIN 4.0 09/25/2020 0945   AST 22 09/25/2020 0945   AST 16 10/17/2019 1529   ALT 19 09/25/2020 0945   ALT 13 10/17/2019 1529   ALKPHOS 99 09/25/2020 0945   BILITOT 1.2 09/25/2020 0945   BILITOT 1.1 10/17/2019 1529   GFRNONAA >60 09/25/2020 0945    GFRNONAA >60 10/17/2019 1529   GFRAA >60 03/27/2020 1008   GFRAA >60 10/17/2019 1529    No results found for: TOTALPROTELP, ALBUMINELP, A1GS, A2GS, BETS, BETA2SER, GAMS, MSPIKE, SPEI  Lab Results  Component Value Date  WBC 5.8 09/25/2020   NEUTROABS 3.7 09/25/2020   HGB 12.4 09/25/2020   HCT 35.4 (L) 09/25/2020   MCV 76.3 (L) 09/25/2020   PLT 218 09/25/2020    No results found for: LABCA2  No components found for: FBXUXY333  No results for input(s): INR in the last 168 hours.  No results found for: LABCA2  No results found for: OVA919  No results found for: TYO060  No results found for: OKH997  Lab Results  Component Value Date   CA2729 29.8 11/16/2019    No components found for: HGQUANT  No results found for: CEA1 / No results found for: CEA1   No results found for: AFPTUMOR  No results found for: CHROMOGRNA  No results found for: KPAFRELGTCHN, LAMBDASER, KAPLAMBRATIO (kappa/lambda light chains)  No results found for: HGBA, HGBA2QUANT, HGBFQUANT, HGBSQUAN (Hemoglobinopathy evaluation)   No results found for: LDH  Lab Results  Component Value Date   IRON 99 10/17/2019   TIBC 391 10/17/2019   IRONPCTSAT 25 10/17/2019   (Iron and TIBC)  Lab Results  Component Value Date   FERRITIN 45 10/17/2019    Urinalysis No results found for: COLORURINE, APPEARANCEUR, LABSPEC, PHURINE, GLUCOSEU, HGBUR, BILIRUBINUR, KETONESUR, PROTEINUR, UROBILINOGEN, NITRITE, LEUKOCYTESUR   STUDIES: No results found.   ELIGIBLE FOR AVAILABLE RESEARCH PROTOCOL: F4142  ASSESSMENT: 45 y.o. [place] woman status post right breast upper inner quadrant biopsy 10/03/2019 for a clinical mT3 N1, stage IIIC invasive ductal carcinoma, grade 3, triple negative, with an MIB-1 of 80%  (a) biopsy of a right axillary node 2019-11-09 showed carcinoma  (b) CT chest/abd/pelvis 10/31/2019 shows 1.5 cm hypodense lesion in liver, lung nodules <0.3 cm  (c) bone scan 10/31/2019  negative  (d) liver MRI 11/11/2019 confirms a targetoid lesion in the left hepatic lobe suspicious for metastasis   (i) liver biopsy 2019-11-23 showed no evidence of malignancy (benign).  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 (p.Thr276Alafs*14)   (a) two variants of uncertain significance were noted - one in the [facility] gene called c.1295T>G and one in the POLD1 gene called c.34G>A  (b) no additional mutations of concern were found in AIP, ALK, APC, ATM, AXIN2,BAP1,  BARD1, BLM, BMPR1A, BRCA1, BRCA2, BRIP1, CASR, CDC73, CDH1, CDK4, CDKN1B, CDKN1C, CDKN2A (p14ARF), CDKN2A (p16INK4a), CEBPA, CHEK2, CTNNA1, DICER1, DIS3L2, EGFR (c.2369C>T, p.Thr790Met variant only), EPCAM (Deletion/duplication testing only), FH, FLCN, GATA2, GPC3, GREM1 (Promoter region deletion/duplication testing only), HOXB13 (c.251G>A, p.Gly84Glu), HRAS, KIT, MAX, MEN1, MET, MITF (c.952G>A, p.Glu318Lys variant only), MLH1, MSH2, MSH3, MSH6, MUTYH, NBN, NF1, NF2, NTHL1, PALB2, PDGFRA, PHOX2B, PMS2, POLD1, POLE, POT1, PRKAR1A, PTCH1, PTEN, RAD50, RAD51C, RAD51D, RB1, RECQL4, RET, RNF43, RUNX1, SDHAF2, SDHA (sequence changes only), SDHB, SDHC, SDHD, SMAD4, SMARCA4, SMARCB1, SMARCE1, STK11, SUFU, TERC, TERT, TMEM127, TP53, TSC1, TSC2, VHL, WRN and WT1.  (2) neoadjuvant chemotherapy consisting of cyclophosphamide and doxorubicin in dose dense fashion x4 started 11/16/2019 and completed 12/27/2019, followed by paclitaxel and carboplatin weekly x12 starting 01/10/2020  (a) echo 10/24/2019 shows an ejection fraction in the 60-65% range  (b) patient opted to discontinue paclitaxel/carboplatin treatments after 6 doses, last dose 02/14/2020  (3) status post right lumpectomy 04/05/2020 for a residual ypT2 ypN1 invasive ductal carcinoma, grade 3, with negative margins.  (a) the single removed right  axillary lymph node was positive  (b) completion axillary lymph node dissection planned for 05/10/2020  (4) adjuvant  radiation 06/19/2020 through 08/01/2020  (a) capecitabine sensitization prescribed but not started by the patient   (5) hemoglobin C trait:  (a) ferritin 10/17/2019 was 45 with saturation 25%, hemoglobin 11.8 and MCV 71.5  (b) hemoglobin electrophoresis 2019-11-22 showed: A: 62.6%, A2: 3.7%, C: 33.2%, F:0 5.1%, S: 0%  (6) post radiation treatment to consist   (a) capecitabine for 6 months at standard doses starting 08/17/2020  (b) olaparib to follow   (c) Keytruda to be considered   PLAN: [name] is tolerating the capecitabine well.  I do not know why she should be 3 days short.  It would be about $50 to get her the extra 3 days and I think we will simply forego that and mail her her next shipment.  She will be starting that on April 7.  She should be finishing April 20.  I am going to see her on April 26, a couple of days before she starts her next cycle.  The plan is to continue 6 months of capecitabine after which we will go on olaparib for at least 1 year  I have asked her to make sure to call when she runs out of the medications to make sure the next supply will be mailed to her on time  She knows to call for any other issue that may develop before the next visit.  Total encounter time 25 minutes.Sarajane Jews C. Willaim Mode, MD 09/25/20 11:52 AM Medical Oncology and Hematology [facility] [place], [place] [address] Tel. [phone]    Fax. [phone]   I, Wilburn Mylar, am acting as scribe for Dr. [name]. Tashonna Descoteaux.  I, Lurline Del MD, have reviewed the above documentation for accuracy and completeness, and I agree with the above.   *Total Encounter Time as defined by the Centers for Medicare and Medicaid Services includes, in addition to the face-to-face time of a patient visit (documented in the note above)  non-face-to-face time: obtaining and reviewing outside history, ordering and reviewing medications, tests or procedures, care coordination (communications with other health care professionals or caregivers) and documentation in the medical record.

## 2020-09-24 NOTE — Telephone Encounter (Signed)
R/s appt per 3/28 sch msg. Pt aware.

## 2020-09-25 ENCOUNTER — Telehealth: Payer: Self-pay | Admitting: Pharmacist

## 2020-09-25 ENCOUNTER — Telehealth: Payer: Self-pay

## 2020-09-25 ENCOUNTER — Other Ambulatory Visit: Payer: Self-pay | Admitting: Oncology

## 2020-09-25 ENCOUNTER — Other Ambulatory Visit: Payer: Self-pay

## 2020-09-25 ENCOUNTER — Inpatient Hospital Stay: Payer: BLUE CROSS/BLUE SHIELD

## 2020-09-25 ENCOUNTER — Inpatient Hospital Stay (HOSPITAL_BASED_OUTPATIENT_CLINIC_OR_DEPARTMENT_OTHER): Payer: BLUE CROSS/BLUE SHIELD | Admitting: Oncology

## 2020-09-25 VITALS — BP 127/99 | HR 82 | Temp 97.4°F | Resp 18 | Ht 67.0 in | Wt 206.4 lb

## 2020-09-25 DIAGNOSIS — D563 Thalassemia minor: Secondary | ICD-10-CM

## 2020-09-25 DIAGNOSIS — C50211 Malignant neoplasm of upper-inner quadrant of right female breast: Secondary | ICD-10-CM

## 2020-09-25 DIAGNOSIS — Z1501 Genetic susceptibility to malignant neoplasm of breast: Secondary | ICD-10-CM

## 2020-09-25 DIAGNOSIS — Z171 Estrogen receptor negative status [ER-]: Secondary | ICD-10-CM

## 2020-09-25 DIAGNOSIS — C773 Secondary and unspecified malignant neoplasm of axilla and upper limb lymph nodes: Secondary | ICD-10-CM | POA: Diagnosis not present

## 2020-09-25 DIAGNOSIS — C50911 Malignant neoplasm of unspecified site of right female breast: Secondary | ICD-10-CM | POA: Diagnosis not present

## 2020-09-25 DIAGNOSIS — C50411 Malignant neoplasm of upper-outer quadrant of right female breast: Secondary | ICD-10-CM

## 2020-09-25 DIAGNOSIS — Z923 Personal history of irradiation: Secondary | ICD-10-CM | POA: Diagnosis not present

## 2020-09-25 LAB — CBC WITH DIFFERENTIAL/PLATELET
Abs Immature Granulocytes: 0.01 K/uL (ref 0.00–0.07)
Basophils Absolute: 0 K/uL (ref 0.0–0.1)
Basophils Relative: 1 %
Eosinophils Absolute: 0.2 K/uL (ref 0.0–0.5)
Eosinophils Relative: 4 %
HCT: 35.4 % — ABNORMAL LOW (ref 36.0–46.0)
Hemoglobin: 12.4 g/dL (ref 12.0–15.0)
Immature Granulocytes: 0 %
Lymphocytes Relative: 24 %
Lymphs Abs: 1.4 K/uL (ref 0.7–4.0)
MCH: 26.7 pg (ref 26.0–34.0)
MCHC: 35 g/dL (ref 30.0–36.0)
MCV: 76.3 fL — ABNORMAL LOW (ref 80.0–100.0)
Monocytes Absolute: 0.5 K/uL (ref 0.1–1.0)
Monocytes Relative: 8 %
Neutro Abs: 3.7 K/uL (ref 1.7–7.7)
Neutrophils Relative %: 63 %
Platelets: 218 K/uL (ref 150–400)
RBC: 4.64 MIL/uL (ref 3.87–5.11)
RDW: 18.1 % — ABNORMAL HIGH (ref 11.5–15.5)
WBC: 5.8 K/uL (ref 4.0–10.5)
nRBC: 0 % (ref 0.0–0.2)

## 2020-09-25 LAB — COMPREHENSIVE METABOLIC PANEL WITH GFR
ALT: 19 U/L (ref 0–44)
AST: 22 U/L (ref 15–41)
Albumin: 4 g/dL (ref 3.5–5.0)
Alkaline Phosphatase: 99 U/L (ref 38–126)
Anion gap: 10 (ref 5–15)
BUN: 9 mg/dL (ref 6–20)
CO2: 26 mmol/L (ref 22–32)
Calcium: 9.2 mg/dL (ref 8.9–10.3)
Chloride: 106 mmol/L (ref 98–111)
Creatinine, Ser: 0.84 mg/dL (ref 0.44–1.00)
GFR, Estimated: >60 mL/min
Glucose, Bld: 99 mg/dL (ref 70–99)
Potassium: 4.1 mmol/L (ref 3.5–5.1)
Sodium: 142 mmol/L (ref 135–145)
Total Bilirubin: 1.2 mg/dL (ref 0.3–1.2)
Total Protein: 7.4 g/dL (ref 6.5–8.1)

## 2020-09-25 MED ORDER — CAPECITABINE 500 MG PO TABS: ORAL_TABLET | ORAL | 4 refills | Status: DC

## 2020-09-25 MED ORDER — CAPECITABINE 500 MG PO TABS
ORAL_TABLET | ORAL | 4 refills | Status: DC
Start: 2020-09-25 — End: 2020-09-25

## 2020-09-25 NOTE — Telephone Encounter (Signed)
Oral Oncology Patient Advocate Encounter  Prior Authorization for Xeloda has been approved.    PA# [id number] Effective dates: 08/26/20 through 09/25/23  [facility] will continue to follow.   [facility] Patient Krista [name] Phone [phone] Fax [phone] 09/25/2020 1:08 PM

## 2020-09-25 NOTE — Telephone Encounter (Signed)
Oral Oncology Pharmacist Encounter  Received new prescription for Xeloda (capecitabine) for the treatment of TNBC, to be used as adjuvant therapy, planned duration 6 months. Note that the patient was prescribed Xeloda to be taken concurrently with RT, but then decided to proceed without chemotherapy during RT. It appears she used her previously filled Xeloda prescription to start adjuvant Xeloda on 2/18. She will re-start Xeloda on 4/7.   Labs from 09/25/20 assessed, okay to continue treatment. Prescription dose and frequency assessed.   Current medication list in Epic reviewed, no DDIs with Xeloda identified. Will confirm patient's OTC medications/supplements.   Evaluated chart and no patient barriers to medication adherence identified.   Patient agreed to treatment on 2/17 per MD documentation.  Prescription has been e-scribed to the [facility] for benefits analysis and approval. Prescription was re-routed to [facility] per insurance preference.   Oral Oncology Clinic will continue to follow for insurance authorization, copayment issues, initial counseling and start date.  Eddie Candle, PharmD, BCPS PGY2 Hematology/Oncology Pharmacy Resident Oral Chemotherapy Navigation Clinic 09/25/2020 12:25 PM

## 2020-09-25 NOTE — Telephone Encounter (Signed)
Oral Oncology Patient Advocate Encounter  Received notification from [employer] that prior authorization for Xeloda is required.  PA submitted on CoverMyMeds Key [id number] Status is pending  Oral Oncology Clinic will continue to follow.  [facility] Phone [phone] Fax [phone] 09/25/2020 12:26 PM

## 2020-09-26 NOTE — Telephone Encounter (Signed)
Oral Chemotherapy Pharmacist Encounter   Attempted to reach patient for follow up on oral medication: Xeloda (capecitabine).   No answer. Left voicemail for patient to call back with any questions or issues.   Leron Croak, PharmD, BCPS Hematology/Oncology Clinical Pharmacist [facility] [phone] 09/26/2020 10:45 AM

## 2020-09-27 ENCOUNTER — Telehealth: Payer: Self-pay | Admitting: Oncology

## 2020-09-27 NOTE — Telephone Encounter (Signed)
Scheduled appt per 3/29 los. Called pt, no answer. No vm available. Mailed updated calendar to pt.

## 2020-09-27 NOTE — Telephone Encounter (Signed)
Oral Chemotherapy Pharmacist Encounter   Attempted to reach patient for follow up on oral medication: Xeloda (capecitabine) .  No answer. Left voicemail for patient to call back with any questions or issues.   Leron Croak, PharmD, BCPS Hematology/Oncology Clinical Pharmacist [facility] [phone] 09/27/2020 1:16 PM

## 2020-09-28 ENCOUNTER — Encounter: Payer: Self-pay | Admitting: *Deleted

## 2020-09-28 NOTE — Telephone Encounter (Signed)
Oral Chemotherapy Pharmacist Encounter   Attempted to reach patient for follow up on oral medication: Xeloda (capecitabine). No answer. Patient's mailbox is full and unable to leave voicemail.  Patient will be starting cycle 3 of Xeloda on 10/04/20 and her insurance is requiring that it now be filled through [facility]. Prescription has already been redirected to [facility], but patient will need to call [phone] to set up medication shipment to ensure medication is delivered to patient's home for next cycle.   Leron Croak, PharmD, BCPS Hematology/Oncology Clinical Pharmacist [facility] [phone] 09/28/2020 3:10 PM

## 2020-10-04 NOTE — Telephone Encounter (Signed)
FYI. Just saw this.

## 2020-10-22 NOTE — Progress Notes (Signed)
[place]  [phone] [phone]     ID: Krista [name] DOB: 06-07-1976  MR#: [id number]  PPJ#:[id number]  Patient Care Team: Everett Graff, MD as PCP - General (Obstetrics and Gynecology) Adaly Puder, [name], MD as Consulting Physician (Oncology) Erroll Luna, MD as Consulting Physician (General Surgery) Eppie Gibson, MD as Attending Physician (Radiation Oncology) Rockwell Germany, RN as Oncology Nurse Navigator Mauro Kaufmann, RN as Oncology Nurse Navigator Chauncey Cruel, MD OTHER MD:   CHIEF COMPLAINT: Triple negative breast cancer  CURRENT TREATMENT: Adjuvant capecitabine   INTERVAL HISTORY: Krista [name] returns today for follow-up of her triple negative breast cancer.    She started capecitabine in mid March 2022. She is tolerating it with no diarrhea no mouth sores and no palmar plantar erythrodysesthesia.   REVIEW OF SYSTEMS: Krista [name] generally feels well.  Some days she has more energy than others.  She walks 2 dogs every day and that is her main exercise.  Sometimes she takes a little nap and then she gets even more energy.  She feels forgetful at times.  Sometimes if she takes a deep breath that causes a little bit of a cough.  Sometimes she feels a little bit dizzy.  Aside from these issues a detailed review of systems today was stable.   COVID 19 VACCINATION STATUS: declines vaccine   HISTORY OF CURRENT ILLNESS: From the original intake note:  [name] [facility] [name]"] herself palpated a lump in the upper-inner right breast sometime late December 2020 or early January 2021.  As the mass grew larger and persisted through menstrual periods she brought it to medical attention and underwent bilateral diagnostic mammography with tomography and bilateral breast ultrasonography at [facility] on 09/26/2019 showing: breast density category C; 5.1 cm mass involving upper-inner quadrant of right breast;  no evidence of right axillary lymphadenopathy or left breast malignancy.  Accordingly on 10/03/2019 she proceeded to biopsy of the right breast area in question. The pathology from this procedure ([id number]) showed: invasive ductal carcinoma, grade 3. Prognostic indicators significant for: estrogen receptor, 0% negative and progesterone receptor, 0% negative. Proliferation marker Ki67 at 80%. HER2 negative by immunohistochemistry (1+).  The patient's subsequent history is as detailed below.   PAST MEDICAL HISTORY: Past Medical History:  Diagnosis Date  . Anemia    history of  . Cancer [facility])    right breast cancer 10/03/19, received chemotherapy  . Family history of breast cancer   . Family history of cervical cancer   . Family history of lung cancer   . Hemorrhoids     PAST SURGICAL HISTORY: Past Surgical History:  Procedure Laterality Date  . AXILLARY LYMPH NODE DISSECTION Right 05/10/2020   Procedure: RIGHT AXILLARY LYMPH NODE DISSECTION;  Surgeon: Erroll Luna, MD;  Location: [facility];  Service: General;  Laterality: Right;  . BREAST LUMPECTOMY WITH RADIOACTIVE SEED AND SENTINEL LYMPH NODE BIOPSY Right 04/05/2020   Procedure: RIGHT BREAST BRACKETED LUMPECTOMY WITH RADIOACTIVE SEED AND RIGHT AXILLARY  TARGETED LYMPH NODE BIOPSY AND SENTINEL LYMPH NODE MAPPING;  Surgeon: Erroll Luna, MD;  Location: [place];  Service: General;  Laterality: Right;  PEC BLOCK  . IR IMAGING GUIDED PORT INSERTION  11/07/2019  . PORT-A-CATH REMOVAL Left 04/05/2020   Procedure: REMOVAL PORT-A-CATH;  Surgeon: Erroll Luna, MD;  Location: [facility] OR;  Service: General;  Laterality: Left;    FAMILY HISTORY: Family History  Problem Relation Age of Onset  . Breast  cancer Mother 69       double mastectomy  . Breast cancer Half-Sister 9  . Cirrhosis Half-Sister   . Heart Problems Father   . Stroke Father   . Breast cancer Maternal Aunt 84  . Cancer Maternal Uncle 57       unknown type  .  Ovarian cancer Maternal Grandmother        dx. in her mid-40s  . Lung cancer Paternal Grandmother   . Cancer Other        unknown type; maternal great-uncles/aunts (x3)  . Diabetes Half-Brother   . Cancer Other        unknown type; matenral great-uncles/aunt (x4)  . Cancer Maternal Great-grandfather        unknown type  . Breast cancer Cousin 49       paternal cousin  The patient's father died at age 46 from cardiac complications of drug use.  His mother, the patient's paternal grandmother had what seems to have been cancer of the lung metastatic to the spine.  There was no other cancer on his side of the family to the patient's knowledge.  The patient's mother died at age 25 in the setting of multiple medical issues but she had undergone double mastectomy at the age of 46.  Her mother, the patient's maternal grandmother had cervical cancer.  The patient's mother had 3 brothers and 3 sisters.  1 of those 3 sisters had breast cancer diagnosed at the age of 46.  The patient herself has 2 brothers and 3 sisters one of her sisters was diagnosed with breast cancer at the age of 46 and died at the age of 73   GYNECOLOGIC HISTORY:  No LMP recorded. (Menstrual status: Chemotherapy). Menarche: 45 years old Age at first live birth: 45 years old [place] P 3 LMP regular, last approximately 5 days, of which the second day is heavy Contraceptive: Husband status post vasectomy HRT no  Hysterectomy? no BSO?  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  Her husband [name] is a Research scientist (medical) and also Librarian, academic at [employer].  Their children are Rowe Pavy, 20, who is Scientist, product/process development at [employer], 18, who will be going to [employer] this coming year and then switch over to [employer], [place] [employer] and aiming for an art rehabilitation degree; and Alanna, 15, currently attending [employer].  The patient is not a church  attender    ADVANCED DIRECTIVES: In the absence of any documents to the contrary the patient's husband is her healthcare power of attorney   HEALTH MAINTENANCE: Social History   Tobacco Use  . Smoking status: Never Smoker  . Smokeless tobacco: Never Used  Vaping Use  . Vaping Use: Never used  Substance Use Topics  . Alcohol use: Not Currently  . Drug use: Never     Colonoscopy: n/a (age)  PAP: UTD  Bone density: n/a (age)   Allergies  Allergen Reactions  . Emend [Fosaprepitant] Shortness Of Breath    Current Outpatient Medications  Medication Sig Dispense Refill  . acetaminophen (TYLENOL) 500 MG tablet Take 1-2 tablets (500-1,000 mg total) by mouth every 6 (six) hours as needed. (Patient not taking: Reported on 06/05/2020) 60 tablet 0  . Biotin w/ Vitamins C & E (HAIR/SKIN/NAILS PO) Take 1 tablet by mouth daily.    . capecitabine (XELODA) 500 MG tablet TAKE 3 TABLETS (1500 MG) BY MOUTH 2  TIMES DAILY, START ON 10/04/2020 AND CONTINUE FOR 14 DAYS THEN OFF A WEEK, THEN REPEAT 84 tablet 4  . Cholecalciferol (VITAMIN D3) 250 MCG (10000 UT) capsule Take 10,000 Units by mouth daily.    . ferrous gluconate (FERGON) 324 MG tablet Take 1 tablet (324 mg total) by mouth daily with breakfast. 100 tablet 3  . ibuprofen (ADVIL) 800 MG tablet Take 1 tablet (800 mg total) by mouth every 8 (eight) hours as needed. (Patient not taking: Reported on 06/05/2020) 30 tablet 0  . loratadine (CLARITIN) 10 MG tablet Take 1 tablet (10 mg total) by mouth daily. (Patient not taking: Reported on 03/22/2020) 60 tablet 0  . MILK THISTLE PO Take 1 capsule by mouth daily.    . Misc Natural Products [facility]) CAPS Take 2 capsules by mouth daily.    . Misc Natural Products (PHYTOCILLIN) CAPS Take 2 capsules by mouth daily.    Marland Kitchen OVER THE COUNTER MEDICATION Take 10 mLs by mouth 2 (two) times daily. floradix otc supplement    . OVER THE COUNTER MEDICATION Take 2 capsules by mouth daily. kyolic aged garlic otc  supplement    . oxyCODONE (OXY IR/ROXICODONE) 5 MG immediate release tablet Take 1 tablet (5 mg total) by mouth every 6 (six) hours as needed for severe pain. (Patient not taking: Reported on 06/05/2020) 15 tablet 0  . valACYclovir (VALTREX) 1000 MG tablet TAKE 1 TABLET BY MOUTH EVERY DAY (Patient not taking: Reported on 03/22/2020) 90 tablet 1   No current facility-administered medications for this visit.    OBJECTIVE: African-American woman in no acute distress  Vitals:   10/23/20 1053  BP: (!) 154/107  Pulse: 74  Resp: 18  Temp: 97.7 F (36.5 C)  SpO2: 100%   Wt Readings from Last 3 Encounters:  10/23/20 200 lb 4.8 oz (90.9 kg)  09/25/20 206 lb 6.4 oz (93.6 kg)  08/16/20 204 lb (92.5 kg)   Body mass index is 31.37 kg/m.    Sclerae unicteric, EOMs intact Wearing a mask No cervical or supraclavicular adenopathy Lungs no rales or rhonchi Heart regular rate and rhythm Abd soft, nontender, positive bowel sounds MSK no focal spinal tenderness, no upper extremity lymphedema Neuro: nonfocal, well oriented, appropriate affect Breasts: Deferred   LAB RESULTS:  CMP     Component Value Date/Time   NA 142 10/23/2020 1029   K 3.9 10/23/2020 1029   CL 106 10/23/2020 1029   CO2 26 10/23/2020 1029   GLUCOSE 115 (H) 10/23/2020 1029   BUN 10 10/23/2020 1029   CREATININE 0.81 10/23/2020 1029   CREATININE 0.95 10/17/2019 1529   CALCIUM 9.3 10/23/2020 1029   PROT 7.2 10/23/2020 1029   ALBUMIN 3.9 10/23/2020 1029   AST 35 10/23/2020 1029   AST 16 10/17/2019 1529   ALT 22 10/23/2020 1029   ALT 13 10/17/2019 1529   ALKPHOS 115 10/23/2020 1029   BILITOT 1.0 10/23/2020 1029   BILITOT 1.1 10/17/2019 1529   GFRNONAA >60 10/23/2020 1029   GFRNONAA >60 10/17/2019 1529   GFRAA >60 03/27/2020 1008   GFRAA >60 10/17/2019 1529    No results found for: TOTALPROTELP, ALBUMINELP, A1GS, A2GS, BETS, BETA2SER, GAMS, MSPIKE, SPEI  Lab Results  Component Value Date   WBC 5.9 10/23/2020    NEUTROABS 4.1 10/23/2020   HGB 11.7 (L) 10/23/2020   HCT 32.7 (L) 10/23/2020   MCV 77.9 (L) 10/23/2020   PLT 247 10/23/2020    No results found for: LABCA2  No components  found for: [id number]  No results for input(s): INR in the last 168 hours.  No results found for: LABCA2  No results found for: DXI338  No results found for: SNK539  No results found for: JQB341  Lab Results  Component Value Date   CA2729 29.8 11/16/2019    No components found for: HGQUANT  No results found for: CEA1 / No results found for: CEA1   No results found for: AFPTUMOR  No results found for: CHROMOGRNA  No results found for: KPAFRELGTCHN, LAMBDASER, KAPLAMBRATIO (kappa/lambda light chains)  No results found for: HGBA, HGBA2QUANT, HGBFQUANT, HGBSQUAN (Hemoglobinopathy evaluation)   No results found for: LDH  Lab Results  Component Value Date   IRON 99 10/17/2019   TIBC 391 10/17/2019   IRONPCTSAT 25 10/17/2019   (Iron and TIBC)  Lab Results  Component Value Date   FERRITIN 45 10/17/2019    Urinalysis No results found for: COLORURINE, APPEARANCEUR, LABSPEC, PHURINE, GLUCOSEU, HGBUR, BILIRUBINUR, KETONESUR, PROTEINUR, UROBILINOGEN, NITRITE, LEUKOCYTESUR   STUDIES: No results found.   ELIGIBLE FOR AVAILABLE RESEARCH PROTOCOL: P3790  ASSESSMENT: 45 y.o. [place] woman status post right breast upper inner quadrant biopsy 10/03/2019 for a clinical mT3 N1, stage IIIC invasive ductal carcinoma, grade 3, triple negative, with an MIB-1 of 80%  (a) biopsy of a right axillary node 2019-11-09 showed carcinoma  (b) CT chest/abd/pelvis 10/31/2019 shows 1.5 cm hypodense lesion in liver, lung nodules <0.3 cm  (c) bone scan 10/31/2019 negative  (d) liver MRI 11/11/2019 confirms a targetoid lesion in the left hepatic lobe suspicious for metastasis   (i) liver biopsy 2019-11-23 showed no evidence of malignancy (benign).  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*14)   (a) two variants of uncertain significance were noted - one in the [facility] gene called c.1295T>G and one in the POLD1 gene called c.34G>A  (b) no additional mutations of concern were found in AIP, ALK, APC, ATM, AXIN2,BAP1,  BARD1, BLM, BMPR1A, BRCA1, BRCA2, BRIP1, CASR, CDC73, CDH1, CDK4, CDKN1B, CDKN1C, CDKN2A (p14ARF), CDKN2A (p16INK4a), CEBPA, CHEK2, CTNNA1, DICER1, DIS3L2, EGFR (c.2369C>T, p.Thr790Met variant only), EPCAM (Deletion/duplication testing only), FH, FLCN, GATA2, GPC3, GREM1 (Promoter region deletion/duplication testing only), HOXB13 (c.251G>A, p.Gly84Glu), HRAS, KIT, MAX, MEN1, MET, MITF (c.952G>A, p.Glu318Lys variant only), MLH1, MSH2, MSH3, MSH6, MUTYH, NBN, NF1, NF2, NTHL1, PALB2, PDGFRA, PHOX2B, PMS2, POLD1, POLE, POT1, PRKAR1A, PTCH1, PTEN, RAD50, RAD51C, RAD51D, RB1, RECQL4, RET, RNF43, RUNX1, SDHAF2, SDHA (sequence changes only), SDHB, SDHC, SDHD, SMAD4, SMARCA4, SMARCB1, SMARCE1, STK11, SUFU, TERC, TERT, TMEM127, TP53, TSC1, TSC2, VHL, WRN and WT1.  (2) neoadjuvant chemotherapy consisting of cyclophosphamide and doxorubicin in dose dense fashion x4 started 11/16/2019 and completed 12/27/2019, followed by paclitaxel and carboplatin weekly x12 starting 01/10/2020  (a) echo 10/24/2019 shows an ejection fraction in the 60-65% range  (b) patient opted to discontinue paclitaxel/carboplatin treatments after 6 doses, last dose 02/14/2020  (3) status post right lumpectomy 04/05/2020 for a residual ypT2 ypN1 invasive ductal carcinoma, grade 3, with negative margins.  (a) the single removed right axillary lymph node was positive  (b) completion axillary lymph node dissection planned for 05/10/2020  (4) adjuvant radiation 06/19/2020 through 08/01/2020  (a) capecitabine sensitization prescribed but not started by the patient   (5) hemoglobin C  trait:  (a) ferritin 10/17/2019 was 45 with saturation 25%, hemoglobin 11.8 and MCV 71.5  (b) hemoglobin electrophoresis 2019-11-22 showed:  A: 62.6%, A2: 3.7%, C: 33.2%, F:0 5.1%, S: 0%  (6) post radiation treatment to consist   (a) capecitabine for 6 months at standard doses starting 08/17/2020  (b) olaparib to follow   (c) Keytruda to be considered   PLAN: [name] continues to tolerate the capecitabine well and she is not having problems with diarrhea mouth sores or palmar plantar erythrodysesthesia.  Accordingly we are proceeding with the next cycle as planned.  She will return in 3 weeks for labs only and in 6 weeks for labs and a visit.  Today we discussed the BRCA positivity.  She understands we cannot screen for ovarian cancer.  She has already gone through menopause because of her chemo.  Accordingly she may have her ovaries removed at any point.  She would like to think about it for now.  As far as the breast is concerned we will do intensified screening.  She had an MRI of the breast most recently in September 2021.  We will do a mammogram this month and repeat an MRI this October  Note that her father-in-law nearly died from [place] and is only now recovering after 4 months in the hospital.  The patient is considering vaccination but is still not ready for it she says.  I think the occasional cough and slight dizziness she experiences sometimes are likely related to seasonal allergies and I suggested she try Claritin daily.  She knows to call for any other issue that may develop before the next visit  Total encounter time 25 minutes.Sarajane Jews C. Martasia Talamante, MD 10/23/20 11:19 AM Medical Oncology and Hematology [facility] [place], [place] [address] Tel. [phone]    Fax. [phone]   I, Wilburn Mylar, am acting as scribe for Dr. [name]. Josecarlos Harriott.  I, Lurline Del MD, have reviewed the above documentation for accuracy and  completeness, and I agree with the above.   *Total Encounter Time as defined by the Centers for Medicare and Medicaid Services includes, in addition to the face-to-face time of a patient visit (documented in the note above) non-face-to-face time: obtaining and reviewing outside history, ordering and reviewing medications, tests or procedures, care coordination (communications with other health care professionals or caregivers) and documentation in the medical record.

## 2020-10-23 ENCOUNTER — Inpatient Hospital Stay (HOSPITAL_BASED_OUTPATIENT_CLINIC_OR_DEPARTMENT_OTHER): Payer: BLUE CROSS/BLUE SHIELD | Admitting: Oncology

## 2020-10-23 ENCOUNTER — Inpatient Hospital Stay: Payer: BLUE CROSS/BLUE SHIELD | Attending: Oncology

## 2020-10-23 ENCOUNTER — Other Ambulatory Visit: Payer: Self-pay

## 2020-10-23 VITALS — BP 154/107 | HR 74 | Temp 97.7°F | Resp 18 | Ht 67.0 in | Wt 200.3 lb

## 2020-10-23 DIAGNOSIS — C50211 Malignant neoplasm of upper-inner quadrant of right female breast: Secondary | ICD-10-CM | POA: Insufficient documentation

## 2020-10-23 DIAGNOSIS — Z809 Family history of malignant neoplasm, unspecified: Secondary | ICD-10-CM | POA: Diagnosis not present

## 2020-10-23 DIAGNOSIS — Z1501 Genetic susceptibility to malignant neoplasm of breast: Secondary | ICD-10-CM | POA: Diagnosis not present

## 2020-10-23 DIAGNOSIS — R197 Diarrhea, unspecified: Secondary | ICD-10-CM | POA: Insufficient documentation

## 2020-10-23 DIAGNOSIS — Z8541 Personal history of malignant neoplasm of cervix uteri: Secondary | ICD-10-CM | POA: Diagnosis not present

## 2020-10-23 DIAGNOSIS — Z801 Family history of malignant neoplasm of trachea, bronchus and lung: Secondary | ICD-10-CM | POA: Insufficient documentation

## 2020-10-23 DIAGNOSIS — Z171 Estrogen receptor negative status [ER-]: Secondary | ICD-10-CM | POA: Diagnosis not present

## 2020-10-23 DIAGNOSIS — Z8041 Family history of malignant neoplasm of ovary: Secondary | ICD-10-CM | POA: Diagnosis not present

## 2020-10-23 DIAGNOSIS — Z79899 Other long term (current) drug therapy: Secondary | ICD-10-CM | POA: Diagnosis not present

## 2020-10-23 DIAGNOSIS — Z803 Family history of malignant neoplasm of breast: Secondary | ICD-10-CM | POA: Diagnosis not present

## 2020-10-23 DIAGNOSIS — D563 Thalassemia minor: Secondary | ICD-10-CM

## 2020-10-23 DIAGNOSIS — C50411 Malignant neoplasm of upper-outer quadrant of right female breast: Secondary | ICD-10-CM

## 2020-10-23 DIAGNOSIS — D582 Other hemoglobinopathies: Secondary | ICD-10-CM | POA: Insufficient documentation

## 2020-10-23 DIAGNOSIS — C50911 Malignant neoplasm of unspecified site of right female breast: Secondary | ICD-10-CM | POA: Diagnosis not present

## 2020-10-23 LAB — COMPREHENSIVE METABOLIC PANEL
ALT: 22 U/L (ref 0–44)
AST: 35 U/L (ref 15–41)
Albumin: 3.9 g/dL (ref 3.5–5.0)
Alkaline Phosphatase: 115 U/L (ref 38–126)
Anion gap: 10 (ref 5–15)
BUN: 10 mg/dL (ref 6–20)
CO2: 26 mmol/L (ref 22–32)
Calcium: 9.3 mg/dL (ref 8.9–10.3)
Chloride: 106 mmol/L (ref 98–111)
Creatinine, Ser: 0.81 mg/dL (ref 0.44–1.00)
GFR, Estimated: >60 mL/min (ref >60)
Glucose, Bld: 115 mg/dL — ABNORMAL HIGH (ref 70–99)
Potassium: 3.9 mmol/L (ref 3.5–5.1)
Sodium: 142 mmol/L (ref 135–145)
Total Bilirubin: 1 mg/dL (ref 0.3–1.2)
Total Protein: 7.2 g/dL (ref 6.5–8.1)

## 2020-10-23 LAB — CBC WITH DIFFERENTIAL/PLATELET
Abs Immature Granulocytes: 0.01 10*3/uL (ref 0.00–0.07)
Basophils Absolute: 0 10*3/uL (ref 0.0–0.1)
Basophils Relative: 1 %
Eosinophils Absolute: 0.2 10*3/uL (ref 0.0–0.5)
Eosinophils Relative: 3 %
HCT: 32.7 % — ABNORMAL LOW (ref 36.0–46.0)
Hemoglobin: 11.7 g/dL — ABNORMAL LOW (ref 12.0–15.0)
Immature Granulocytes: 0 %
Lymphocytes Relative: 20 %
Lymphs Abs: 1.2 10*3/uL (ref 0.7–4.0)
MCH: 27.9 pg (ref 26.0–34.0)
MCHC: 35.8 g/dL (ref 30.0–36.0)
MCV: 77.9 fL — ABNORMAL LOW (ref 80.0–100.0)
Monocytes Absolute: 0.4 10*3/uL (ref 0.1–1.0)
Monocytes Relative: 7 %
Neutro Abs: 4.1 10*3/uL (ref 1.7–7.7)
Neutrophils Relative %: 69 %
Platelets: 247 10*3/uL (ref 150–400)
RBC: 4.2 MIL/uL (ref 3.87–5.11)
RDW: 17.6 % — ABNORMAL HIGH (ref 11.5–15.5)
WBC: 5.9 10*3/uL (ref 4.0–10.5)
nRBC: 0 % (ref 0.0–0.2)

## 2020-10-23 MED ORDER — CAPECITABINE 500 MG PO TABS: ORAL_TABLET | ORAL | 4 refills | Status: AC

## 2020-10-31 ENCOUNTER — Encounter: Payer: Self-pay | Admitting: Adult Health

## 2020-11-01 ENCOUNTER — Telehealth: Payer: Self-pay

## 2020-11-01 NOTE — Telephone Encounter (Signed)
Ms. Krista [name] contacted [place] today via [place] regarding her Xeloda prescription. This medication was last filled on 10/23/2020 but the patient advised [place] that she had not received it yet. I contacted [facility] directly to see what the issue was. I was advised that there was an issue with the shipment and that they were having to process the prescription through a different warehouse location which was the issue causing the delay. I spoke with Estill Bamberg a pharmacist with [facility] who confirmed that the medication would be shipped to the patient tomorrow 11/02/20. I informed the patient about this through [place] and advised her to contact [facility] and reach out to [place] if she had not received the medication by 11/03/20. [facility]: (Mamie Nick) [phone]

## 2020-11-13 ENCOUNTER — Inpatient Hospital Stay: Payer: BLUE CROSS/BLUE SHIELD

## 2020-11-13 ENCOUNTER — Telehealth: Payer: Self-pay | Admitting: Oncology

## 2020-11-13 NOTE — Telephone Encounter (Signed)
R/s appt per 5/17 sch msg. Pt aware.  

## 2020-11-15 ENCOUNTER — Inpatient Hospital Stay: Payer: BLUE CROSS/BLUE SHIELD | Attending: Oncology

## 2020-11-15 ENCOUNTER — Other Ambulatory Visit: Payer: Self-pay

## 2020-11-15 DIAGNOSIS — D563 Thalassemia minor: Secondary | ICD-10-CM | POA: Insufficient documentation

## 2020-11-15 DIAGNOSIS — C50411 Malignant neoplasm of upper-outer quadrant of right female breast: Secondary | ICD-10-CM

## 2020-11-15 DIAGNOSIS — Z1501 Genetic susceptibility to malignant neoplasm of breast: Secondary | ICD-10-CM

## 2020-11-15 DIAGNOSIS — C50211 Malignant neoplasm of upper-inner quadrant of right female breast: Secondary | ICD-10-CM | POA: Diagnosis present

## 2020-11-15 DIAGNOSIS — C50911 Malignant neoplasm of unspecified site of right female breast: Secondary | ICD-10-CM

## 2020-11-15 DIAGNOSIS — Z171 Estrogen receptor negative status [ER-]: Secondary | ICD-10-CM

## 2020-11-15 LAB — CBC WITH DIFFERENTIAL/PLATELET
Abs Immature Granulocytes: 0.02 10*3/uL (ref 0.00–0.07)
Basophils Absolute: 0 10*3/uL (ref 0.0–0.1)
Basophils Relative: 1 %
Eosinophils Absolute: 0.1 10*3/uL (ref 0.0–0.5)
Eosinophils Relative: 2 %
HCT: 33.4 % — ABNORMAL LOW (ref 36.0–46.0)
Hemoglobin: 11.8 g/dL — ABNORMAL LOW (ref 12.0–15.0)
Immature Granulocytes: 0 %
Lymphocytes Relative: 24 %
Lymphs Abs: 1.4 10*3/uL (ref 0.7–4.0)
MCH: 27.7 pg (ref 26.0–34.0)
MCHC: 35.3 g/dL (ref 30.0–36.0)
MCV: 78.4 fL — ABNORMAL LOW (ref 80.0–100.0)
Monocytes Absolute: 0.5 10*3/uL (ref 0.1–1.0)
Monocytes Relative: 9 %
Neutro Abs: 3.7 10*3/uL (ref 1.7–7.7)
Neutrophils Relative %: 64 %
Platelets: 268 10*3/uL (ref 150–400)
RBC: 4.26 MIL/uL (ref 3.87–5.11)
RDW: 16.6 % — ABNORMAL HIGH (ref 11.5–15.5)
WBC: 5.7 10*3/uL (ref 4.0–10.5)
nRBC: 0 % (ref 0.0–0.2)

## 2020-11-15 LAB — COMPREHENSIVE METABOLIC PANEL
ALT: 28 U/L (ref 0–44)
AST: 53 U/L — ABNORMAL HIGH (ref 15–41)
Albumin: 3.7 g/dL (ref 3.5–5.0)
Alkaline Phosphatase: 174 U/L — ABNORMAL HIGH (ref 38–126)
Anion gap: 11 (ref 5–15)
BUN: 10 mg/dL (ref 6–20)
CO2: 29 mmol/L (ref 22–32)
Calcium: 9.6 mg/dL (ref 8.9–10.3)
Chloride: 104 mmol/L (ref 98–111)
Creatinine, Ser: 0.82 mg/dL (ref 0.44–1.00)
GFR, Estimated: >60 mL/min (ref >60)
Glucose, Bld: 89 mg/dL (ref 70–99)
Potassium: 4.1 mmol/L (ref 3.5–5.1)
Sodium: 144 mmol/L (ref 135–145)
Total Bilirubin: 1.3 mg/dL — ABNORMAL HIGH (ref 0.3–1.2)
Total Protein: 7.4 g/dL (ref 6.5–8.1)

## 2020-11-16 LAB — FOLLICLE STIMULATING HORMONE: FSH: 77.8 m[IU]/mL

## 2020-11-23 LAB — ESTRADIOL, ULTRA SENS: Estradiol, Sensitive: 5.2 pg/mL

## 2020-12-16 NOTE — Progress Notes (Addendum)
[place]  Telephone:([phone]) [phone] Fax:(336) [phone]     ID: Krista [name] DOB: 1976-04-06  MR#: [id number]  DXI#:[id number]  Patient Care Team: Krista Graff, MD as PCP - General (Obstetrics and Gynecology) Krista [name], Krista Dad, MD as Consulting Physician (Oncology) Krista Luna, MD as Consulting Physician (General Surgery) Krista Gibson, MD as Attending Physician (Radiation Oncology) Krista Germany, RN as Oncology Nurse Navigator Krista Kaufmann, RN as Oncology Nurse Navigator Krista Cruel, MD OTHER MD:   CHIEF COMPLAINT: Triple negative breast cancer  CURRENT TREATMENT: pembrolizumab/ Beryle Flock   INTERVAL HISTORY: Krista [name] today for follow-up of her triple negative breast cancer.  She had been scheduled midmorning but called to tell us that she "slept late."  Likely we had a cancellation at noon and she was able to calm at that time  She started capecitabine in mid March 2022.  She has been having quite a few symptoms which are likely not related to the capecitabine but she feels that possibly they could be so she stopped the medication December 11, 2020 with no intention of resume resuming it.  Note that she did not have significant issues with diarrhea, palmar plantar erythrodysesthesia or mouth sores, which are the common side effects of capecitabine in the setting.  REVIEW OF SYSTEMS: Krista [name] does not feel well.  Her back hurts.  It is not a diffuse pain but rather focal in the mid back and then in the lower back.  This is affecting her walking and she is using a cane right now.  The pain is fairly constant, and it is very limiting.  She cannot walk or even sit for prolonged periods of time without having to change positions.  If she takes ibuprofen that helped significantly but she does not want to take medications daily.  She feels like she has some knots inside her abdomen particularly in the right upper quadrant area.  This is not  something she can palpate just something she can feel from the inside.  She has been constipated.  She feels very short of breath.  She denies unusual headaches visual changes nausea or vomiting.  There have been no intercurrent falls.  A detailed review of systems today was otherwise stable.   COVID 19 VACCINATION STATUS: declines vaccine   HISTORY OF CURRENT ILLNESS: From the original intake note:  Krista [name] [name]"] herself palpated a lump in the upper-inner right breast sometime late December 2020 or early January 2021.  As the mass grew larger and persisted through menstrual periods she brought it to medical attention and underwent bilateral diagnostic mammography with tomography and bilateral breast ultrasonography at [facility] on 09/26/2019 showing: breast density category C; 5.1 cm mass involving upper-inner quadrant of right breast; no evidence of right axillary lymphadenopathy or left breast malignancy.  Accordingly on 10/03/2019 she proceeded to biopsy of the right breast area in question. The pathology from this procedure ([id number]) showed: invasive ductal carcinoma, grade 3. Prognostic indicators significant for: estrogen receptor, 0% negative and progesterone receptor, 0% negative. Proliferation marker Ki67 at 80%. HER2 negative by immunohistochemistry (1+).  The patient's subsequent history is as detailed below.   PAST MEDICAL HISTORY: Past Medical History:  Diagnosis Date   Anemia    history of   Cancer ([place])    right breast cancer 10/03/19, received chemotherapy   Family history of breast cancer    Family history of cervical cancer    Family history  of lung cancer    Hemorrhoids     PAST SURGICAL HISTORY: Past Surgical History:  Procedure Laterality Date   AXILLARY LYMPH NODE DISSECTION Right 05/10/2020   Procedure: RIGHT AXILLARY LYMPH NODE DISSECTION;  Surgeon: Krista Luna, MD;  Location: [place];  Service: General;   Laterality: Right;   BREAST LUMPECTOMY WITH RADIOACTIVE SEED AND SENTINEL LYMPH NODE BIOPSY Right 04/05/2020   Procedure: RIGHT BREAST BRACKETED LUMPECTOMY WITH RADIOACTIVE SEED AND RIGHT AXILLARY  TARGETED LYMPH NODE BIOPSY AND SENTINEL LYMPH NODE MAPPING;  Surgeon: Krista Luna, MD;  Location: [place];  Service: General;  Laterality: Right;  PEC BLOCK   IR IMAGING GUIDED PORT INSERTION  11/07/2019   PORT-A-CATH REMOVAL Left 04/05/2020   Procedure: REMOVAL PORT-A-CATH;  Surgeon: Krista Luna, MD;  Location: [place];  Service: General;  Laterality: Left;    FAMILY HISTORY: Family History  Problem Relation Age of Onset   Breast cancer Mother 72       double mastectomy   Breast cancer Half-Sister 67   Cirrhosis Half-Sister    Heart Problems Father    Stroke Father    Breast cancer Maternal Aunt 31   Cancer Maternal Uncle 46       unknown type   Ovarian cancer Maternal Grandmother        dx. in her mid-40s   Lung cancer Paternal Grandmother    Cancer Other        unknown type; maternal great-uncles/aunts (x3)   Diabetes Half-Brother    Cancer Other        unknown type; matenral great-uncles/aunt (x4)   Cancer Maternal Great-grandfather        unknown type   Breast cancer Cousin 62       paternal cousin  The patient's father died at age 14 from cardiac complications of drug use.  His mother, the patient's paternal grandmother had what seems to have been cancer of the lung metastatic to the spine.  There was no other cancer on his side of the family to the patient's knowledge.  The patient's mother died at age 53 in the setting of multiple medical issues but she had undergone double mastectomy at the age of 15.  Her mother, the patient's maternal grandmother had cervical cancer.  The patient's mother had 3 brothers and 3 sisters.  1 of those 3 sisters had breast cancer diagnosed at the age of 78.  The patient herself has 2 brothers and 3 sisters one of her sisters was diagnosed with breast  cancer at the age of 80 and died at the age of 28   GYNECOLOGIC HISTORY:  No LMP recorded. (Menstrual status: Chemotherapy). Menarche: 45 years old Age at first live birth: 45 years old [place] P 3 LMP regular, last approximately 5 days, of which the second day is heavy Contraceptive: Husband status post vasectomy HRT no  Hysterectomy? no BSO?  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  Her husband [name] is a Research scientist (medical) and also Librarian, academic at [employer].  Their children are Rowe Pavy, 20, who is Scientist, product/process development at [employer], 18, who will be going to [employer] this coming year and then switch over to [employer], [place] [employer] and aiming for an art rehabilitation degree; and Alanna, 40, currently attending [employer].  The patient is not a church attender    ADVANCED DIRECTIVES: In the absence of any documents to  the contrary the patient's husband is her healthcare power of attorney   HEALTH MAINTENANCE: Social History   Tobacco Use   Smoking status: Never   Smokeless tobacco: Never  Vaping Use   Vaping Use: Never used  Substance Use Topics   Alcohol use: Not Currently   Drug use: Never     Colonoscopy: n/a (age)  PAP: UTD  Bone density: n/a (age)   Allergies  Allergen Reactions   Emend [Fosaprepitant] Shortness Of Breath    Current Outpatient Medications  Medication Sig Dispense Refill   acetaminophen (TYLENOL) 500 MG tablet Take 1-2 tablets (500-1,000 mg total) by mouth every 6 (six) hours as needed. (Patient not taking: Reported on 06/05/2020) 60 tablet 0   Biotin w/ Vitamins C & E (HAIR/SKIN/NAILS PO) Take 1 tablet by mouth daily.     capecitabine (XELODA) 500 MG tablet TAKE 3 TABLETS (1500 MG) BY MOUTH 2 TIMES DAILY, START ON 10/04/2020 AND CONTINUE FOR 14 DAYS THEN OFF A WEEK, THEN REPEAT 84 tablet 4   Cholecalciferol (VITAMIN D3) 250 MCG (10000 UT) capsule Take 10,000 Units by mouth  daily.     ferrous gluconate (FERGON) 324 MG tablet Take 1 tablet (324 mg total) by mouth daily with breakfast. 100 tablet 3   ibuprofen (ADVIL) 800 MG tablet Take 1 tablet (800 mg total) by mouth every 8 (eight) hours as needed. (Patient not taking: Reported on 06/05/2020) 30 tablet 0   loratadine (CLARITIN) 10 MG tablet Take 1 tablet (10 mg total) by mouth daily. (Patient not taking: Reported on 03/22/2020) 60 tablet 0   MILK THISTLE PO Take 1 capsule by mouth daily.     Misc Natural Products [facility]) CAPS Take 2 capsules by mouth daily.     Misc Natural Products (PHYTOCILLIN) CAPS Take 2 capsules by mouth daily.     OVER THE COUNTER MEDICATION Take 10 mLs by mouth 2 (two) times daily. floradix otc supplement     OVER THE COUNTER MEDICATION Take 2 capsules by mouth daily. kyolic aged garlic otc supplement     oxyCODONE (OXY IR/ROXICODONE) 5 MG immediate release tablet Take 1 tablet (5 mg total) by mouth every 6 (six) hours as needed for severe pain. (Patient not taking: Reported on 06/05/2020) 15 tablet 0   valACYclovir (VALTREX) 1000 MG tablet TAKE 1 TABLET BY MOUTH EVERY DAY (Patient not taking: Reported on 03/22/2020) 90 tablet 1   No current facility-administered medications for this visit.    OBJECTIVE: African-American woman using a cane  Vitals:   12/17/20 1209  BP: (!) 158/113  Pulse: 90  Resp: 19  Temp: (!) 97.5 F (36.4 C)  SpO2: 100%   Wt Readings from Last 3 Encounters:  12/17/20 189 lb 8 oz (86 kg)  10/23/20 200 lb 4.8 oz (90.9 kg)  09/25/20 206 lb 6.4 oz (93.6 kg)   Body mass index is 29.68 kg/m.    Sclerae unicteric, EOMs intact Wearing a mask No cervical or supraclavicular adenopathy Lungs no rales or rhonchi Heart regular rate and rhythm Abd soft, nontender including significant palpation of the right upper quadrant MSK no focal spinal tenderness, no upper extremity lymphedema Neuro: nonfocal, well oriented, appropriate affect Breasts: The right breast is  status post lumpectomy and radiation.  There is no evidence of local recurrence.  Left breast is benign.  Both axillae are benign   LAB RESULTS:  CMP     Component Value Date/Time   NA 139 12/17/2020 1149   K 4.1  12/17/2020 1149   CL 103 12/17/2020 1149   CO2 27 12/17/2020 1149   GLUCOSE 115 (H) 12/17/2020 1149   BUN 13 12/17/2020 1149   CREATININE 0.72 12/17/2020 1149   CALCIUM 9.8 12/17/2020 1149   PROT 7.4 12/17/2020 1149   ALBUMIN 3.5 12/17/2020 1149   AST 104 (H) 12/17/2020 1149   ALT 53 (H) 12/17/2020 1149   ALKPHOS 318 (H) 12/17/2020 1149   BILITOT 1.0 12/17/2020 1149   GFRNONAA >60 12/17/2020 1149   GFRAA >60 03/27/2020 1008   GFRAA >60 10/17/2019 1529    No results found for: TOTALPROTELP, ALBUMINELP, A1GS, A2GS, BETS, BETA2SER, GAMS, MSPIKE, SPEI  Lab Results  Component Value Date   WBC 7.0 12/17/2020   NEUTROABS 5.2 12/17/2020   HGB 11.6 (L) 12/17/2020   HCT 33.1 (L) 12/17/2020   MCV 78.3 (L) 12/17/2020   PLT 275 12/17/2020    No results found for: LABCA2  No components found for: [id number]  No results for input(s): INR in the last 168 hours.  No results found for: LABCA2  No results found for: WUJ811  No results found for: BJY782  No results found for: NFA213  Lab Results  Component Value Date   CA2729 29.8 11/16/2019    No components found for: HGQUANT  No results found for: CEA1 / No results found for: CEA1   No results found for: AFPTUMOR  No results found for: CHROMOGRNA  No results found for: KPAFRELGTCHN, LAMBDASER, KAPLAMBRATIO (kappa/lambda light chains)  No results found for: HGBA, HGBA2QUANT, HGBFQUANT, HGBSQUAN (Hemoglobinopathy evaluation)   No results found for: LDH  Lab Results  Component Value Date   IRON 99 10/17/2019   TIBC 391 10/17/2019   IRONPCTSAT 25 10/17/2019   (Iron and TIBC)  Lab Results  Component Value Date   FERRITIN 45 10/17/2019    Urinalysis No results found for: COLORURINE,  APPEARANCEUR, LABSPEC, PHURINE, GLUCOSEU, HGBUR, BILIRUBINUR, KETONESUR, PROTEINUR, UROBILINOGEN, NITRITE, LEUKOCYTESUR   STUDIES: No results found.   ELIGIBLE FOR AVAILABLE RESEARCH PROTOCOL: Y8657  ASSESSMENT: 45 y.o. BRCA1 positive Krista [name] woman status post right breast upper inner quadrant biopsy 10/03/2019 for a clinical mT3 N1, stage IIIC invasive ductal carcinoma, grade 3, triple negative, with an MIB-1 of 80%  (a) biopsy of a right axillary node 2019-11-09 showed carcinoma  (b) CT chest/abd/pelvis 10/31/2019 shows 1.5 cm hypodense lesion in liver, lung nodules <0.3 cm  (c) bone scan 10/31/2019 negative  (d) liver MRI 11/11/2019 confirms a targetoid lesion in the left hepatic lobe suspicious for metastasis   (i) liver biopsy 2019-11-23 showed no evidence of malignancy (benign).  (e) baseline CA 27-29 on 11/16/2019 normal at 29.8  (1) genetics testing 2019-11-01 through the the [facility] Multi-Cancer panel found a pathogenic variant in the BRCA1 gene called c.815_824dup (p.Thr276Alafs*14)   (a) two variants of uncertain significance were noted - one in the [facility] gene called c.1295T>G and one in the POLD1 gene called c.34G>A  (b) no additional mutations of concern were found in AIP, ALK, APC, ATM, AXIN2,BAP1,  BARD1, BLM, BMPR1A, BRCA1, BRCA2, BRIP1, CASR, CDC73, CDH1, CDK4, CDKN1B, CDKN1C, CDKN2A (p14ARF), CDKN2A (p16INK4a), CEBPA, CHEK2, CTNNA1, DICER1, DIS3L2, EGFR (c.2369C>T, p.Thr790Met variant only), EPCAM (Deletion/duplication testing only), FH, FLCN, GATA2, GPC3, GREM1 (Promoter region deletion/duplication testing only), HOXB13 (c.251G>A, p.Gly84Glu), HRAS, KIT, MAX, MEN1, MET, MITF (c.952G>A, p.Glu318Lys variant only), MLH1, MSH2, MSH3, MSH6, MUTYH, NBN, NF1, NF2, NTHL1, PALB2, PDGFRA, PHOX2B, PMS2, POLD1, POLE, POT1, PRKAR1A, PTCH1, PTEN, RAD50, RAD51C, RAD51D, RB1, RECQL4, RET, RNF43,  RUNX1, SDHAF2, SDHA (sequence changes only), SDHB, SDHC, SDHD, SMAD4, SMARCA4, SMARCB1, SMARCE1,  STK11, SUFU, TERC, TERT, TMEM127, TP53, TSC1, TSC2, VHL, WRN and WT1.  (2) neoadjuvant chemotherapy consisting of cyclophosphamide and doxorubicin in dose dense fashion x4 started 11/16/2019 and completed 12/27/2019, followed by paclitaxel and carboplatin weekly x12 starting 01/10/2020  (a) echo 10/24/2019 shows an ejection fraction in the 60-65% range  (b) patient opted to discontinue paclitaxel/carboplatin treatments after 6 doses, last dose 02/14/2020  (3) status post right lumpectomy 04/05/2020 for a residual ypT2 ypN1 invasive ductal carcinoma, grade 3, with negative margins.  (a) the single removed right axillary lymph node was positive  (b) completion axillary lymph node dissection 05/10/2020 removed an additional 7 right axillary lymph nodes, all clear   (4) adjuvant radiation 06/19/2020 through 08/01/2020  (a) capecitabine sensitization prescribed but not started by the patient   (5) hemoglobin C trait:  (a) ferritin 10/17/2019 was 45 with saturation 25%, hemoglobin 11.8 and MCV 71.5  (b) hemoglobin electrophoresis 2019-11-22 showed: A: 62.6%, A2: 3.7%, C: 33.2%, F:0 5.1%, S: 0%  (6) molecular studies obtained from the 04/05/2020 sample showed the tumor to be PD-L1 positive, CPS: 100; genomic LOH was high, MSI stable and mismatch repair proficient.  PIK3CA and PTEN were not mutated  (7)post radiation treatment to consist   (a) capecitabine for 6 months at standard doses starting 08/17/2020, discontinued by patient 12/11/2020  (b) olaparib to follow   (c) Keytruda to be considered ([id number] results pending)   PLAN: Krista [name] stopped the capecitabine 2 weeks ago with a variety of symptoms.  The symptoms likely were not related to that medication and in fact they do persist.  I am concerned that this may represent recurrent disease.  She is agreeable to restaging.  I am putting her in for a total spine MRI, MRI of the abdomen with special focus on the liver where she had that  suspicious lesion that was biopsied last year, and also a CT of the chest.  She discontinued the capecitabine as noted.  We discussed going on either olaparib or pembrolizumab.  She much prefers the pembrolizumab since it is given every 3 weeks.  She has a good understanding of the possible toxicities side effects and complications of this agent.  She is going to return to see me July 11 to follow-up on all this and she will have her first Keytruda dose at that time.  I have added the pertinent thyroid labs to accompany that.  She will see me 01/07/2021: With her first Keytruda dose and we will review her restaging studies at that time  Total encounter time 35 minutes.Sarajane Jews C. Amarri Michaelson, MD 12/17/20 2:21 PM Medical Oncology and Hematology [facility] [place], [place] [address] Tel. [phone]    Fax. [phone]   I, Wilburn Mylar, am acting as scribe for Dr. Virgie [name]. Kadan Millstein.  I, Lurline Del MD, have reviewed the above documentation for accuracy and completeness, and I agree with the above.   *Total Encounter Time as defined by the Centers for Medicare and Medicaid Services includes, in addition to the face-to-face time of a patient visit (documented in the note above) non-face-to-face time: obtaining and reviewing outside history, ordering and reviewing medications, tests or procedures, care coordination (communications with other health care professionals or caregivers) and documentation in the medical record.

## 2020-12-17 ENCOUNTER — Other Ambulatory Visit: Payer: Self-pay

## 2020-12-17 ENCOUNTER — Telehealth: Payer: Self-pay

## 2020-12-17 ENCOUNTER — Inpatient Hospital Stay (HOSPITAL_BASED_OUTPATIENT_CLINIC_OR_DEPARTMENT_OTHER): Payer: BLUE CROSS/BLUE SHIELD | Admitting: Oncology

## 2020-12-17 ENCOUNTER — Inpatient Hospital Stay: Payer: BLUE CROSS/BLUE SHIELD | Attending: Oncology

## 2020-12-17 VITALS — BP 158/113 | HR 90 | Temp 97.5°F | Resp 19 | Wt 189.5 lb

## 2020-12-17 DIAGNOSIS — Z7189 Other specified counseling: Secondary | ICD-10-CM

## 2020-12-17 DIAGNOSIS — C50211 Malignant neoplasm of upper-inner quadrant of right female breast: Secondary | ICD-10-CM | POA: Diagnosis present

## 2020-12-17 DIAGNOSIS — Z171 Estrogen receptor negative status [ER-]: Secondary | ICD-10-CM | POA: Insufficient documentation

## 2020-12-17 DIAGNOSIS — Z8049 Family history of malignant neoplasm of other genital organs: Secondary | ICD-10-CM | POA: Diagnosis not present

## 2020-12-17 DIAGNOSIS — C50411 Malignant neoplasm of upper-outer quadrant of right female breast: Secondary | ICD-10-CM

## 2020-12-17 DIAGNOSIS — Z8041 Family history of malignant neoplasm of ovary: Secondary | ICD-10-CM | POA: Insufficient documentation

## 2020-12-17 DIAGNOSIS — Z1501 Genetic susceptibility to malignant neoplasm of breast: Secondary | ICD-10-CM

## 2020-12-17 DIAGNOSIS — Z801 Family history of malignant neoplasm of trachea, bronchus and lung: Secondary | ICD-10-CM | POA: Diagnosis not present

## 2020-12-17 DIAGNOSIS — C50911 Malignant neoplasm of unspecified site of right female breast: Secondary | ICD-10-CM | POA: Diagnosis not present

## 2020-12-17 DIAGNOSIS — D563 Thalassemia minor: Secondary | ICD-10-CM

## 2020-12-17 LAB — CBC WITH DIFFERENTIAL/PLATELET
Abs Immature Granulocytes: 0.04 10*3/uL (ref 0.00–0.07)
Basophils Absolute: 0 10*3/uL (ref 0.0–0.1)
Basophils Relative: 0 %
Eosinophils Absolute: 0.2 10*3/uL (ref 0.0–0.5)
Eosinophils Relative: 3 %
HCT: 33.1 % — ABNORMAL LOW (ref 36.0–46.0)
Hemoglobin: 11.6 g/dL — ABNORMAL LOW (ref 12.0–15.0)
Immature Granulocytes: 1 %
Lymphocytes Relative: 14 %
Lymphs Abs: 1 10*3/uL (ref 0.7–4.0)
MCH: 27.4 pg (ref 26.0–34.0)
MCHC: 35 g/dL (ref 30.0–36.0)
MCV: 78.3 fL — ABNORMAL LOW (ref 80.0–100.0)
Monocytes Absolute: 0.5 10*3/uL (ref 0.1–1.0)
Monocytes Relative: 8 %
Neutro Abs: 5.2 10*3/uL (ref 1.7–7.7)
Neutrophils Relative %: 74 %
Platelets: 275 10*3/uL (ref 150–400)
RBC: 4.23 MIL/uL (ref 3.87–5.11)
RDW: 15.9 % — ABNORMAL HIGH (ref 11.5–15.5)
WBC: 7 10*3/uL (ref 4.0–10.5)
nRBC: 0 % (ref 0.0–0.2)

## 2020-12-17 LAB — CMP (CANCER CENTER ONLY)
ALT: 53 U/L — ABNORMAL HIGH (ref 0–44)
AST: 104 U/L — ABNORMAL HIGH (ref 15–41)
Albumin: 3.5 g/dL (ref 3.5–5.0)
Alkaline Phosphatase: 318 U/L — ABNORMAL HIGH (ref 38–126)
Anion gap: 9 (ref 5–15)
BUN: 13 mg/dL (ref 6–20)
CO2: 27 mmol/L (ref 22–32)
Calcium: 9.8 mg/dL (ref 8.9–10.3)
Chloride: 103 mmol/L (ref 98–111)
Creatinine: 0.72 mg/dL (ref 0.44–1.00)
GFR, Estimated: >60 mL/min (ref >60)
Glucose, Bld: 115 mg/dL — ABNORMAL HIGH (ref 70–99)
Potassium: 4.1 mmol/L (ref 3.5–5.1)
Sodium: 139 mmol/L (ref 135–145)
Total Bilirubin: 1 mg/dL (ref 0.3–1.2)
Total Protein: 7.4 g/dL (ref 6.5–8.1)

## 2020-12-17 NOTE — Telephone Encounter (Signed)
Pt called stating she overslept for her 1000 appt w/GM. Offered pt appt w/lab at 1130 and MD visit 1200. Pt verbalized agreement and thanks.

## 2020-12-18 ENCOUNTER — Other Ambulatory Visit: Payer: Self-pay | Admitting: Adult Health

## 2020-12-18 NOTE — Progress Notes (Signed)
Orders updated to reflect the "expected test dates" of imaging orders per radiology's request.   Wilber Bihari, NP

## 2020-12-21 ENCOUNTER — Telehealth: Payer: Self-pay | Admitting: *Deleted

## 2020-12-21 NOTE — Telephone Encounter (Signed)
This RN was informed by prior Josem Kaufmann- that CT total spine scheduled for 6/28 has to be cancelled due to prior authorization still pending- need to contact pt.  This RN called pt and obtained name identified VM of pt- message left per above.  This RN did inquire with the prior auth dept if there is a number here that the patient could contact for follow up on authorization- was informed that the patient should call the number on their insurance card for status of approval

## 2020-12-25 ENCOUNTER — Ambulatory Visit (HOSPITAL_COMMUNITY): Payer: BLUE CROSS/BLUE SHIELD

## 2020-12-27 ENCOUNTER — Ambulatory Visit (HOSPITAL_COMMUNITY)
Admission: RE | Admit: 2020-12-27 | Discharge: 2020-12-27 | Disposition: A | Discharge status: Home / Self Care | Payer: BLUE CROSS/BLUE SHIELD | Source: Ambulatory Visit | Attending: Oncology | Admitting: Oncology

## 2020-12-27 DIAGNOSIS — C50911 Malignant neoplasm of unspecified site of right female breast: Secondary | ICD-10-CM | POA: Diagnosis present

## 2020-12-27 DIAGNOSIS — Z1501 Genetic susceptibility to malignant neoplasm of breast: Secondary | ICD-10-CM | POA: Diagnosis present

## 2020-12-27 DIAGNOSIS — Z171 Estrogen receptor negative status [ER-]: Secondary | ICD-10-CM | POA: Diagnosis present

## 2020-12-27 DIAGNOSIS — Z7189 Other specified counseling: Secondary | ICD-10-CM | POA: Diagnosis present

## 2020-12-27 DIAGNOSIS — C50211 Malignant neoplasm of upper-inner quadrant of right female breast: Secondary | ICD-10-CM | POA: Insufficient documentation

## 2020-12-27 MED ORDER — GADOBUTROL 1 MMOL/ML IV SOLN
8.5000 mL | Freq: Once | INTRAVENOUS | Status: AC | PRN
  Administered 2020-12-27: 8.5 mL via INTRAVENOUS

## 2021-01-01 ENCOUNTER — Other Ambulatory Visit: Payer: Self-pay

## 2021-01-01 ENCOUNTER — Ambulatory Visit (HOSPITAL_COMMUNITY)
Admission: RE | Admit: 2021-01-01 | Discharge: 2021-01-01 | Disposition: A | Discharge status: Home / Self Care | Payer: BLUE CROSS/BLUE SHIELD | Source: Ambulatory Visit | Attending: Oncology | Admitting: Oncology

## 2021-01-01 ENCOUNTER — Other Ambulatory Visit: Payer: Self-pay | Admitting: Oncology

## 2021-01-01 DIAGNOSIS — C50911 Malignant neoplasm of unspecified site of right female breast: Secondary | ICD-10-CM | POA: Diagnosis present

## 2021-01-01 DIAGNOSIS — C50211 Malignant neoplasm of upper-inner quadrant of right female breast: Secondary | ICD-10-CM | POA: Diagnosis present

## 2021-01-01 DIAGNOSIS — Z171 Estrogen receptor negative status [ER-]: Secondary | ICD-10-CM | POA: Insufficient documentation

## 2021-01-01 DIAGNOSIS — Z1501 Genetic susceptibility to malignant neoplasm of breast: Secondary | ICD-10-CM | POA: Insufficient documentation

## 2021-01-01 DIAGNOSIS — Z7189 Other specified counseling: Secondary | ICD-10-CM | POA: Insufficient documentation

## 2021-01-01 NOTE — Progress Notes (Signed)
I called Krista [name] and gave her the results of the liver MRI.  The left this is a phone message since there was no answer otherwise.  She is scheduled for CT of the chest today an MRI of the spine tomorrow and she will see me 711.  And let her know that it is fine for her family to come for that visit so that Everybody can get the information at the same time.

## 2021-01-02 ENCOUNTER — Other Ambulatory Visit: Payer: Self-pay | Admitting: Oncology

## 2021-01-02 ENCOUNTER — Ambulatory Visit (HOSPITAL_COMMUNITY)
Admission: RE | Admit: 2021-01-02 | Discharge: 2021-01-02 | Disposition: A | Discharge status: Home / Self Care | Payer: BLUE CROSS/BLUE SHIELD | Source: Ambulatory Visit | Attending: Oncology | Admitting: Oncology

## 2021-01-02 DIAGNOSIS — Z171 Estrogen receptor negative status [ER-]: Secondary | ICD-10-CM | POA: Insufficient documentation

## 2021-01-02 DIAGNOSIS — Z1501 Genetic susceptibility to malignant neoplasm of breast: Secondary | ICD-10-CM | POA: Diagnosis present

## 2021-01-02 DIAGNOSIS — Z7189 Other specified counseling: Secondary | ICD-10-CM | POA: Diagnosis present

## 2021-01-02 DIAGNOSIS — C50911 Malignant neoplasm of unspecified site of right female breast: Secondary | ICD-10-CM | POA: Diagnosis present

## 2021-01-02 DIAGNOSIS — C50211 Malignant neoplasm of upper-inner quadrant of right female breast: Secondary | ICD-10-CM

## 2021-01-02 MED ORDER — GADOBUTROL 1 MMOL/ML IV SOLN
8.5000 mL | Freq: Once | INTRAVENOUS | Status: AC | PRN
  Administered 2021-01-02: 8.5 mL via INTRAVENOUS

## 2021-01-02 NOTE — Progress Notes (Signed)
IVT consulted for PIV r/t difficult start.  IVT called to give ETA and was informed PIV access was obtained.

## 2021-01-02 NOTE — Progress Notes (Unsigned)
I called me yesterday (it is 59 1:18 PM) and left a message on her voicemail.  Let her know that I would like to change her appointment from Monday at noon when we really do not have enough time to Tuesday at 4 PM avoid we have 1 to 2 hours as needed to go over everything.  I gave her my beeper number and my nurses and my office number so she could call us back.

## 2021-01-03 ENCOUNTER — Telehealth: Payer: Self-pay | Admitting: Oncology

## 2021-01-03 NOTE — Telephone Encounter (Signed)
Cancelled and r/s appts per 7/6 sch msg. Called pt, no answer. Left msg with updated appts date and times.

## 2021-01-07 ENCOUNTER — Other Ambulatory Visit: Payer: BLUE CROSS/BLUE SHIELD

## 2021-01-07 ENCOUNTER — Other Ambulatory Visit: Payer: Self-pay | Admitting: Oncology

## 2021-01-07 ENCOUNTER — Ambulatory Visit: Payer: BLUE CROSS/BLUE SHIELD

## 2021-01-07 ENCOUNTER — Ambulatory Visit: Payer: BLUE CROSS/BLUE SHIELD | Admitting: Oncology

## 2021-01-07 NOTE — Progress Notes (Signed)
Krista [name]  [phone] [phone]) [phone]     ID: Krista [name] DOB: [birth year]  MR#: [id number]  AUQ#:[id number]  Patient Care Team: Everett Graff, MD as PCP - General (Obstetrics and Gynecology) Veida Spira, [name], MD as Consulting Physician (Oncology) Erroll Luna, MD as Consulting Physician (General Surgery) Eppie Gibson, MD as Attending Physician (Radiation Oncology) Rockwell Germany, RN as Oncology Nurse Navigator Mauro Kaufmann, RN as Oncology Nurse Navigator Chauncey Cruel, MD OTHER MD:   CHIEF COMPLAINT: Triple negative breast cancer, now metastatic  CURRENT TREATMENT: considering treatment options   INTERVAL HISTORY: Krista [name] returns today for follow-up of her triple negative breast cancer.  She tells me her husband Krista [name] brought her and is waiting outside in the car for her but did not want to come.  She says he is angry because nothing worked after all the things they tried.  To review, at the last visit Krista [name] reported she had stopped capecitabine because of some symptoms she was experiencing.  However these seem to be unlikely due to the capecitabine.  Accordingly we proceeded to restaging studies.  She had an MRI of the abdomen with and without contrast on 12/27/2020, which showed multiple liver metastases as well as multiple bone lesions.  I called and left her voicemail with these results and set her up for MRI of the total spine which was performed 01/02/2021 this showed widespread bony metastases throughout the spine with pathologic fractures at C7 and T1 and T7 but no epidural tumor.  There was a dominant left sacral metastasis with mild extraosseous tumor extension.  Fibroid uterus was incidentally noted.  She also had a chest CT on 01/01/2021 which showed very small pulmonary nodules as well as mediastinal and hilar adenopathy.  In short at this point work-up is consistent with widely metastatic disease  involving at least the liver and bones likely also the lungs and lymph nodes.  Krista [name] is here today to discuss neck steps  REVIEW OF SYSTEMS: Krista [name] has quite a bit of pain.  This primarily involves her back.  She is not taking pain medicine unless she needs to do something and then she takes a Tylenol.  She says that usually works about 6 hours.  She has not had headaches or vomiting.  She has had slight nausea & has felt a little dizzy.  She denies balance issues or falls.  She says her appetite is down and she is eating a better diet which her husband is providing for her to avoid foods that cause phlegm or mucus production and to avoid carbohydrates.  She says her taste is fine.  Bowel movements tend to run from either liquidy or chunky she has 1 about every 2 days.  She feels easily exhausted and short of breath.  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  She has not noted any focal weakness or sensory loss.   COVID 19 VACCINATION STATUS: declines vaccine   HISTORY OF CURRENT ILLNESS: From the original intake note:  Krista [name]"] herself palpated a lump in the upper-inner right breast sometime late December 2020 or early January 2021.  As the mass grew larger and persisted through menstrual periods she brought it to medical attention and underwent bilateral diagnostic mammography with tomography and bilateral breast ultrasonography at [facility] on 09/26/2019 showing: breast density category C; 5.1 cm  mass involving upper-inner quadrant of right breast; no evidence of right axillary lymphadenopathy or left breast malignancy.  Accordingly on 10/03/2019 she proceeded to biopsy of the right breast area in question. The pathology from this procedure ([id number]) showed: invasive ductal carcinoma, grade 3. Prognostic indicators significant for: estrogen receptor, 0% negative and progesterone receptor,  0% negative. Proliferation marker Ki67 at 80%. HER2 negative by immunohistochemistry (1+).  The patient's subsequent history is as detailed below.   PAST MEDICAL HISTORY: Past Medical History:  Diagnosis Date   Anemia    history of   Cancer ([place])    right breast cancer 10/03/19, received chemotherapy   Family history of breast cancer    Family history of cervical cancer    Family history of lung cancer    Hemorrhoids     PAST SURGICAL HISTORY: Past Surgical History:  Procedure Laterality Date   AXILLARY LYMPH NODE DISSECTION Right 05/10/2020   Procedure: RIGHT AXILLARY LYMPH NODE DISSECTION;  Surgeon: Erroll Luna, MD;  Location: [place];  Service: General;  Laterality: Right;   BREAST LUMPECTOMY WITH RADIOACTIVE SEED AND SENTINEL LYMPH NODE BIOPSY Right 04/05/2020   Procedure: RIGHT BREAST BRACKETED LUMPECTOMY WITH RADIOACTIVE SEED AND RIGHT AXILLARY  TARGETED LYMPH NODE BIOPSY AND SENTINEL LYMPH NODE MAPPING;  Surgeon: Erroll Luna, MD;  Location: [facility];  Service: General;  Laterality: Right;  PEC BLOCK   IR IMAGING GUIDED PORT INSERTION  11/07/2019   PORT-A-CATH REMOVAL Left 04/05/2020   Procedure: REMOVAL PORT-A-CATH;  Surgeon: Erroll Luna, MD;  Location: [facility];  Service: General;  Laterality: Left;    FAMILY HISTORY: Family History  Problem Relation Age of Onset   Breast cancer Mother 58       double mastectomy   Breast cancer Half-Sister Krista [name]   Cirrhosis Half-Sister    Heart Problems Father    Stroke Father    Breast cancer Maternal Aunt 82   Cancer Maternal Uncle 64       unknown type   Ovarian cancer Maternal Grandmother        dx. in her mid-40s   Lung cancer Paternal Grandmother    Cancer Other        unknown type; maternal great-uncles/aunts (x3)   Diabetes Half-Brother    Cancer Other        unknown type; matenral great-uncles/aunt (x4)   Cancer Maternal Great-grandfather        unknown type   Breast cancer Cousin 62       paternal  cousin  The patient's father died at age 23 from cardiac complications of drug use.  His mother, the patient's paternal grandmother had what seems to have been cancer of the lung metastatic to the spine.  There was no other cancer on his side of the family to the patient's knowledge.  The patient's mother died at age 11 in the setting of multiple medical issues but she had undergone double mastectomy at the age of 3.  Her mother, the patient's maternal grandmother had cervical cancer.  The patient's mother had 3 brothers and 3 sisters.  1 of those 3 sisters had breast cancer diagnosed at the age of 71.  The patient herself has 2 brothers and 3 sisters one of her sisters was diagnosed with breast cancer at the age of 69 and died at the age of 22   GYNECOLOGIC HISTORY:  No LMP recorded. (Menstrual status: Chemotherapy). Menarche: 45 years old Age at first live birth: 45 years old [place]  P 3 LMP regular, last approximately 5 days, of which the second day is heavy Contraceptive: Husband status post vasectomy HRT no  Hysterectomy? no BSO?  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  Her husband Krista [name] is a Research scientist (medical) and also Librarian, academic at [employer].  Their children are Krista [name], Krista [name], who is Scientist, product/process development at [employer], Krista [name], who will be going to [employer] this coming year and then switch over to [employer], [place] and aiming for an art rehabilitation degree; and Krista [name], Krista [name], currently attending [employer].  The patient is not a church attender    ADVANCED DIRECTIVES: In the absence of any documents to the contrary the patient's husband is her healthcare power of attorney   HEALTH MAINTENANCE: Social History   Tobacco Use   Smoking status: Never   Smokeless tobacco: Never  Vaping Use   Vaping Use: Never used  Substance Use Topics   Alcohol use: Not Currently   Drug use: Never     Colonoscopy: n/a  (age)  PAP: UTD  Bone density: n/a (age)   Allergies  Allergen Reactions   Emend [Fosaprepitant] Shortness Of Breath    Current Outpatient Medications  Medication Sig Dispense Refill   acetaminophen (TYLENOL) 500 MG tablet Take 1-2 tablets (500-1,000 mg total) by mouth every 6 (six) hours as needed. (Patient not taking: Reported on 06/05/2020) 60 tablet 0   Biotin w/ Vitamins C & E (HAIR/SKIN/NAILS PO) Take 1 tablet by mouth daily.     capecitabine (XELODA) 500 MG tablet TAKE 3 TABLETS (1500 MG) BY MOUTH 2 TIMES DAILY, START ON 10/04/2020 AND CONTINUE FOR 14 DAYS THEN OFF A WEEK, THEN REPEAT 84 tablet 4   Cholecalciferol (VITAMIN D3) 250 MCG (10000 UT) capsule Take 10,000 Units by mouth daily.     ferrous gluconate (FERGON) 324 MG tablet Take 1 tablet (324 mg total) by mouth daily with breakfast. 100 tablet 3   Misc Natural Products (PHYTOCILLIN) CAPS Take 2 capsules by mouth daily.     OVER THE COUNTER MEDICATION Take 10 mLs by mouth 2 (two) times daily. floradix otc supplement     OVER THE COUNTER MEDICATION Take 2 capsules by mouth daily. kyolic aged garlic otc supplement     No current facility-administered medications for this visit.    OBJECTIVE: African-American woman examined in a wheelchair  Vitals:   01/08/21 1616  BP: 115/78  Pulse: (!) 105  Resp: Krista [name]  Temp: 98 F (36.7 C)  SpO2: 100%    Wt Readings from Last 3 Encounters:  01/08/21 174 lb (78.9 kg)  06/Krista [name]/22 189 lb 8 oz (86 kg)  10/23/20 200 lb 4.8 oz (90.9 kg)   Body mass index is 27.25 kg/m.    Sclerae minimally icteric, EOMs intact Lungs no rales or rhonchi Heart regular rate and rhythm Abd soft, nontender, positive bowel sounds MSK no focal spinal tenderness, Neuro: nonfocal, well oriented, appropriate affect Breasts: Deferred   LAB RESULTS:  CMP     Component Value Date/Time   NA 136 01/08/2021 1607   K 4.2 01/08/2021 1607   CL 95 (L) 01/08/2021 1607   CO2 25 01/08/2021 1607   GLUCOSE 84  01/08/2021 1607   BUN 13 01/08/2021 1607   CREATININE 0.77 01/08/2021 1607   CREATININE 0.72 06/Krista [name]/2022 1149   CALCIUM 11.0 (H) 01/08/2021 1607   PROT 7.2 01/08/2021 1607   ALBUMIN 3.0 (L)  01/08/2021 1607   AST 168 (H) 01/08/2021 1607   AST 104 (H) 06/Krista [name]/2022 1149   ALT 56 (H) 01/08/2021 1607   ALT 53 (H) 06/Krista [name]/2022 1149   ALKPHOS 496 (H) 01/08/2021 1607   BILITOT 2.8 (H) 01/08/2021 1607   BILITOT 1.0 06/Krista [name]/2022 1149   GFRNONAA >60 01/08/2021 1607   GFRNONAA >60 06/Krista [name]/2022 1149   GFRAA >60 03/27/2020 1008   GFRAA >60 10/17/2019 1529    No results found for: TOTALPROTELP, ALBUMINELP, A1GS, A2GS, BETS, BETA2SER, GAMS, MSPIKE, SPEI  Lab Results  Component Value Date   WBC 7.6 01/08/2021   NEUTROABS 5.3 01/08/2021   HGB 12.3 01/08/2021   HCT 34.7 (L) 01/08/2021   MCV 75.3 (L) 01/08/2021   PLT 320 01/08/2021    No results found for: LABCA2  No components found for: VZCHYI502  No results for input(s): INR in the last 168 hours.  No results found for: LABCA2  No results found for: CAN199  No results found for: DXA128  No results found for: NOM767  Lab Results  Component Value Date   CA2729 29.8 11/16/2019    No components found for: HGQUANT  No results found for: CEA1 / No results found for: CEA1   No results found for: AFPTUMOR  No results found for: CHROMOGRNA  No results found for: KPAFRELGTCHN, LAMBDASER, KAPLAMBRATIO (kappa/lambda light chains)  No results found for: HGBA, HGBA2QUANT, HGBFQUANT, HGBSQUAN (Hemoglobinopathy evaluation)   No results found for: LDH  Lab Results  Component Value Date   IRON 99 10/17/2019   TIBC 391 10/17/2019   IRONPCTSAT 25 10/17/2019   (Iron and TIBC)  Lab Results  Component Value Date   FERRITIN 45 10/17/2019    Urinalysis No results found for: COLORURINE, APPEARANCEUR, LABSPEC, PHURINE, GLUCOSEU, HGBUR, BILIRUBINUR, KETONESUR, PROTEINUR, UROBILINOGEN, NITRITE, LEUKOCYTESUR   STUDIES: CT Chest Wo  Contrast  Result Date: 01/01/2021 CLINICAL DATA:  History of breast cancer, staging evaluation in 45 year old female. EXAM: CT CHEST WITHOUT CONTRAST TECHNIQUE: Multidetector CT imaging of the chest was performed following the standard protocol without IV contrast. COMPARISON:  May of 2021. FINDINGS: Cardiovascular: Normal heart size. Normal thoracic aorta in terms of caliber and contour. Normal caliber of the central pulmonary vasculature. Mediastinum/Nodes: Subcarinal adenopathy (image 63/2) 1.8 cm. Small RIGHT hilar lymph node (image 76/2) 8 mm. Scattered small RIGHT paratracheal lymph nodes suspicious given other findings in the chest. Low internal mammary/chest wall lymph node (image 106/2) 5 mm. Scattered pre pericardial lymph nodes less than a cm. RIGHT axillary dissection since the previous study. No LEFT axillary adenopathy or LEFT hilar adenopathy. Esophagus grossly normal. Lungs/Pleura: Small pulmonary nodules less than a cm in the RIGHT upper lobe along the minor fissure not present on previous imaging 3 mm (image 67/5) other smaller nodules are present as well. Airways are patent. No effusion. No consolidation. Upper Abdomen: Diffuse hepatic metastatic disease with multiple new lesions in the liver. The LEFT hepatic lobe is nearly completely involved by tumor measuring approximately 14 x 6.5 cm greatest axial dimension. Largest discrete area of tumor in the RIGHT hepatic lobe in hepatic subsegment VIII measuring 5 x 4.1 cm (image 25/2) Suspect upper abdominal lymphadenopathy. The areas are incompletely imaged but lymph nodes measuring up to 13 mm (image 140/2) this is a celiac node with other lymph nodes in the area likely with enlargement. Musculoskeletal: Numerous lytic foci in the spine which are new compared to prior imaging. Pathologic fractures of C7 and T1. Approximately 40-50% loss of height  at C7 Krista [name]-30% loss of height at T1. Mild loss of height at the T7 vertebral level of the superior  endplate with subtle superior endplate fracture in the setting of metastatic lesion in the RIGHT lateral aspect of the vertebral body and more diffuse infiltration extending across the midline. Approximately 15-Krista [name]% loss of height at this level. Posterior cortical disruption along the LEFT lateral aspect of T7 associated with an approximately 1.2 cm lesion in this area. Metastatic foci without fracture at T3, T9, T11 and L1. Posterior cortical disruption and T9, 2 cm lesion with lytic changes at this location. L1 lytic focus measuring 2.2 x 1.5 cm. Paste that Lytic LEFT scapular lesion.  This is in the body of the scapula. IMPRESSION: 1. Multifocal bony metastatic disease most pronounced in the lower cervical and thoracic spine but also with involvement of the lumbar spine and LEFT scapula. 2. Pathologic fractures in the cervical and thoracic spine as described most pronounced at C7-T1 and T7 with areas of posterior cortical vertebral body disruption due to metastatic lesions. MRI of the spine could be considered for further evaluation as clinically warranted. 3. Diffuse hepatic metastatic disease with multiple new lesions in the liver. 4. Signs of mediastinal and suspected hilar adenopathy. Suspected upper abdominal lymphadenopathy. Electronically Signed   By: Zetta Bills M.D.   On: 01/01/2021 21:14   MR Abdomen W Wo Contrast  Result Date: 12/29/2020 CLINICAL DATA:  Breast cancer. Evaluate for liver metastasis. Triple negative breast cancer. Immunotherapy ongoing. EXAM: MRI ABDOMEN WITHOUT AND WITH CONTRAST TECHNIQUE: Multiplanar multisequence MR imaging of the abdomen was performed both before and after the administration of intravenous contrast. CONTRAST:  8.57m GADAVIST GADOBUTROL 1 MMOL/ML IV SOLN COMPARISON:  MRI 11/11/2018 FINDINGS: Lower chest:  Lung bases are clear. Hepatobiliary: There are innumerable round lesions within the liver which are hyperintense on T2 weighted imaging and demonstrate  restricted diffusion. Lesions vary in size and occupy the near entirety of LEFT and RIGHT hepatic lobe. Lesions demonstrate peripheral hyperenhancement and central hypoenhancement typical of metastatic lesions (series 16) Example lesion in the RIGHT hepatic lobe measures 4.8 cm (image 6/series 5). Example lesion in the LEFT lateral hepatic lobe measures 1.1 cm (image 7 image 7/5). Lesions reach confluence in the anterior LEFT hepatic lobe with confluent mass of lesions measuring 12.0 x 4.5 cm (image 11/5). No biliary duct dilatation. Gallbladder is normal. Common bile duct normal. Pancreas: Normal pancreatic parenchymal intensity. No ductal dilatation or inflammation. Spleen: Normal spleen. Adrenals/urinary tract: Adrenal glands and kidneys are normal. Stomach/Bowel: Stomach and limited of the small bowel is unremarkable Vascular/Lymphatic: No new lesions evident within the lower thoracic spine and lumbar spine. Example T2 hyperintense lesion on fat suppressed T2 weighted imaging (8 mm lesion on image 29/series 5). Lesion at L5 measures 1.5 cm on the same sequence (image 33). 15 mm lesion in the mid lumbar spine on image 25. There is a broad lesion in the S1 vertebral body / LEFT sacrum which measures 3.9 x 2.3 cm. Musculoskeletal: No aggressive osseous lesion IMPRESSION: 1. Unfortunately, there is new multifocal hepatic metastasis with innumerable lesions occupying the LEFT and RIGHT hepatic lobe. Lesions reach confluence in the LEFT hepatic lobe. 2. Multiple lesions within the lower spine and LEFT sacrum consistent with skeletal metastasis. These results will be called to the ordering clinician or representative by the Radiologist Assistant, and communication documented in the PACS or [employer] Electronically Signed   By: SSuzy BouchardM.D.   On: 12/29/2020 15:08  MR TOTAL SPINE METS SCREENING  Result Date: 01/02/2021 CLINICAL DATA:  Metastatic breast cancer.  Spine pain. EXAM: MRI TOTAL SPINE  WITHOUT AND WITH CONTRAST TECHNIQUE: Multisequence MR imaging of the spine from the cervical spine to the sacrum was performed prior to and following IV contrast administration for evaluation of spinal metastatic disease. Sagittal imaging was performed of the entire spine. Postcontrast axial imaging was performed through the cervicothoracic junction and lumbosacral junction. CONTRAST:  8.5 mL Gadavist COMPARISON:  Chest CT 01/01/2021. Abdominal MRI 12/27/2020. Nuclear medicine whole-body bone scan 10/31/2019. FINDINGS: MRI CERVICAL SPINE FINDINGS Alignment: Normal. Vertebrae: Abnormal STIR hyperintensity and heterogeneous enhancement throughout the C7 vertebral body corresponding to known osseous metastases with a pathologic fracture and moderate vertebral body height loss, unchanged from yesterday's chest CT. Small volume ventral epidural enhancement at C7-T1 appears to be vascular without definite epidural tumor identified. Focal STIR hyperintensity anteriorly along the C2 inferior endplate which may represent a subcentimeter metastasis or be degenerative. Cord: Normal cord signal.  No abnormal intradural enhancement. Posterior Fossa, vertebral arteries, paraspinal tissues: Unremarkable. Disc levels: Mild cervical spondylosis with detailed assessment limited in the absence of complete axial imaging. Disc bulging at C4-5 likely results in mild spinal stenosis. MRI THORACIC SPINE FINDINGS Alignment:  Normal. Vertebrae: Widespread enhancing lesions throughout the thoracic spine involving the vertebral bodies and posterior elements. Extensive involvement of the T1, T7, T9, and T12 vertebral bodies. Pathologic fractures of the T1 and T7 vertebral bodies with mild height loss. No evidence of epidural tumor. Cord:  Normal cord signal.  No abnormal intradural enhancement. Paraspinal and other soft tissues: Partial imaging of known widespread liver metastases. Disc levels: No evidence of significant disc degeneration or  stenosis. MRI LUMBAR SPINE FINDINGS Segmentation:  Standard. Alignment:  Normal. Vertebrae: Numerous enhancing lesions throughout the lumbar spine involving all of the vertebral bodies as well as right L5 transverse process. Extensive involvement of the L1 vertebral body. No evidence of epidural tumor in the lumbar spine. Multiple lesions in the sacrum including an approximately 5 cm lesion involving the S1 and S2 segments and left sacral ala. Mildly displaced fracture of the S1 superior endplate. Suspected mild extraosseous extension of the large left-sided sacral metastasis with tumor potentially contacting the not clearly compressing the left S1 nerve root. Lesions in the posterior left greater than right ilia. Conus medullaris: Extends to the L1 level and appears normal. Paraspinal and other soft tissues: Small volume free fluid in the pelvis. Enlarged fibroid uterus. Disc levels: Preserved disc space heights with at most minimal multilevel disc bulging. No evidence of significant stenosis. IMPRESSION: 1. Widespread osseous metastases throughout the spine with pathologic fractures at C7, T1, and T7. No epidural tumor. 2. Dominant left sacral metastasis with mild extraosseous tumor extension. 3. Known liver metastases. 4. Fibroid uterus. Electronically Signed   By: Logan Bores M.D.   On: 01/02/2021 19:38     ELIGIBLE FOR AVAILABLE RESEARCH PROTOCOL: [id number]  ASSESSMENT: 45 y.o. BRCA1 positive [place] woman status post right breast upper inner quadrant biopsy 10/03/2019 for a clinical mT3 N1, stage IIIC invasive ductal carcinoma, grade 3, triple negative, with an MIB-1 of 80%  (a) biopsy of a right axillary node 2019-11-09 showed carcinoma  (b) CT chest/abd/pelvis 10/31/2019 shows 1.5 cm hypodense lesion in liver, lung nodules <0.3 cm  (c) bone scan 10/31/2019 negative  (d) liver MRI 11/11/2019 confirms a targetoid lesion in the left hepatic lobe suspicious for metastasis   (i) liver biopsy 2019-11-23  showed no  evidence of malignancy (benign).  (e) baseline CA 27-29 on 11/16/2019 normal at 29.8  (1) genetics testing 2019-11-01 through the the [facility] Multi-Cancer panel found a pathogenic variant in the BRCA1 gene called c.815_824dup (p.Thr276Alafs*14)   (a) two variants of uncertain significance were noted - one in the [facility] gene called c.1295T>G and one in the POLD1 gene called c.34G>A  (b) no additional mutations of concern were found in AIP, ALK, APC, ATM, AXIN2,BAP1,  BARD1, BLM, BMPR1A, BRCA1, BRCA2, BRIP1, CASR, CDC73, CDH1, CDK4, CDKN1B, CDKN1C, CDKN2A (p14ARF), CDKN2A (p16INK4a), CEBPA, CHEK2, CTNNA1, DICER1, DIS3L2, EGFR (c.2369C>T, p.Thr790Met variant only), EPCAM (Deletion/duplication testing only), FH, FLCN, GATA2, GPC3, GREM1 (Promoter region deletion/duplication testing only), HOXB13 (c.251G>A, p.Gly84Glu), HRAS, KIT, MAX, MEN1, MET, MITF (c.952G>A, p.Glu318Lys variant only), MLH1, MSH2, MSH3, MSH6, MUTYH, NBN, NF1, NF2, NTHL1, PALB2, PDGFRA, PHOX2B, PMS2, POLD1, POLE, POT1, PRKAR1A, PTCH1, PTEN, RAD50, RAD51C, RAD51D, RB1, RECQL4, RET, RNF43, RUNX1, SDHAF2, SDHA (sequence changes only), SDHB, SDHC, SDHD, SMAD4, SMARCA4, SMARCB1, SMARCE1, STK11, SUFU, TERC, TERT, TMEM127, TP53, TSC1, TSC2, VHL, WRN and WT1.  (2) neoadjuvant chemotherapy consisting of cyclophosphamide and doxorubicin in dose dense fashion x4 started 11/16/2019 and completed 12/27/2019, followed by paclitaxel and carboplatin weekly x12 starting 01/10/2020  (a) echo 10/24/2019 shows an ejection fraction in the 60-65% range  (b) patient opted to discontinue paclitaxel/carboplatin treatments after 6 doses, last dose 02/14/2020  (3) status post right lumpectomy 04/05/2020 for a residual ypT2 ypN1 invasive ductal carcinoma, grade 3, with negative margins.  (a) the single removed right axillary lymph node was positive  (b) completion axillary lymph node dissection 05/10/2020 removed an additional 7 right axillary lymph nodes,  all clear   (4) adjuvant radiation 06/19/2020 through 08/01/2020  (a) capecitabine sensitization prescribed but not started by the patient   (5) hemoglobin C trait:  (a) ferritin 10/17/2019 was 45 with saturation 25%, hemoglobin 11.8 and MCV 71.5  (b) hemoglobin electrophoresis 2019-11-22 showed: A: 62.6%, A2: 3.7%, C: 33.2%, F:0 5.1%, S: 0%  (6) molecular studies obtained from the 04/05/2020 sample showed the tumor to be PD-L1 positive, CPS: 100; genomic LOH was high, MSI stable and mismatch repair proficient.  PIK3CA and PTEN were not mutated  (7) post radiation treatment:  (a) capecitabine planned for 6 months at standard doses starting 02/Krista [name]/2022, discontinued by patient 12/11/2020   METASTATIC DISEASE: July 2022, involving liver and bones , likely also lungs and nodes (8) initial workup:  (A) MRI of the abdomen 12/27/2020 shows multiple liver lesions occupying most of the left and right hepatic lobes as well as multiple bone lesions  (B) CT of the chest 0705 shows scattered very small lung nodules of unclear significance, mediastinal and hilar adenopathy  (C) total spinal MRI confirms multifocal bony metastatic disease especially in the lower cervical and thoracic spine with compression fractures at C7, T1, and T7.,  With no epidural tumor noted  (D) CA 27-29   (E) biopsy  (9) at 01/08/2021 visit recommended Abraxane day 1 day 8 with pembrolizumab day 1, repeated every 21 days; other options were pembrolizumab alone or olaparib  (10) also recommended denosumab/Xgeva and kyphoplasty    PLAN: I reviewed Vernard Gambles situation with her in detail.  The information which I wrote down for her is separately copied and scanned.  She understands that stage IV breast cancer is not curable.  We do have treatment and the treatments can be very effective.    I offered her as the simplest treatment olaparib which she would take  orally.  This can be very effective in BRCA positive women like  her.  A slightly more complicated treatment (becomes given intravenously) would be pembrolizumab, which is a good option given her very high PD-L1 level.  My actual recommendation was that she receive some chemotherapy as well as immunotherapy, specifically Abraxane and pembrolizumab  As far as the bone lesions are concerned I suggested denosumab/Xgeva or a similar agent and also consideration of kyphoplasty.  Given that we are dealing with cervical involvement the kyphoplasty might best be done by neurosurgery.  We also discussed the fact that she has not had a biopsy of these areas and I suggested a liver biopsy to confirm the diagnosis and to make sure the tumor has not changed from the primary presentation.  Also she has not had a brain scan.  She is having some dizziness and a brain MRI or of the CT would complete the work-up  We also reviewed pain control and I suggested she could take 500 mg of Tylenol 3-4 times a day safely.  If that does not take care of the pain she could take Tylenol 500 mg together with Aleve 220 mg up to 3 times a day safely.  If that does not control the pain she would let us know and we would initiate narcotics.  Krista [name] says that she feels there is no point to any of this because she tried everything before and it did not work.  She understands from our discussion that she has not tried olaparib or Keytruda.  We discussed the fact that if she does not do anything her survival may be brief.  She is at significant risk of liver failure given the extensive involvement of tumor in her liver.  If she does choose no treatment or at least no standard treatment I recommend that we place a hospice referral and they can help her family care for her at home and if that becomes impossible then she could be cared [facility] at [facility].  I also suggested if she was not sure what to do to consider a second opinion and we could help her obtain one with minimal delay.  I asked her if  there was anything I could do for her at this point and she said no.  She told me that she would be getting back to [place] in the next day or 2 to let us know what her plans are.  (She has my nurses number as well as my beeper number).  Total encounter time 65 minutes.Sarajane Jews C. Shirle Provencal, MD 01/08/21 4:59 PM Medical Oncology [facility] [place], [place] [address] Tel. [phone]    Fax. [phone]   I, Wilburn Mylar, am acting as scribe for Dr. [name]. Shannel Zahm.  I, Lurline Del MD, have reviewed the above documentation for accuracy and completeness, and I agree with the above.   *Total Encounter Time as defined by the Centers for Medicare and Medicaid Services includes, in addition to the face-to-face time of a patient visit (documented in the note above) non-face-to-face time: obtaining and reviewing outside history, ordering and reviewing medications, tests or procedures, care coordination (communications with other health care professionals or caregivers) and documentation in the medical record.

## 2021-01-08 ENCOUNTER — Inpatient Hospital Stay (HOSPITAL_BASED_OUTPATIENT_CLINIC_OR_DEPARTMENT_OTHER): Payer: BLUE CROSS/BLUE SHIELD | Admitting: Oncology

## 2021-01-08 ENCOUNTER — Other Ambulatory Visit: Payer: Self-pay

## 2021-01-08 ENCOUNTER — Inpatient Hospital Stay: Payer: BLUE CROSS/BLUE SHIELD | Attending: Oncology

## 2021-01-08 VITALS — BP 115/78 | HR 105 | Temp 98.0°F | Resp 18 | Wt 174.0 lb

## 2021-01-08 DIAGNOSIS — D582 Other hemoglobinopathies: Secondary | ICD-10-CM | POA: Diagnosis not present

## 2021-01-08 DIAGNOSIS — Z79899 Other long term (current) drug therapy: Secondary | ICD-10-CM | POA: Insufficient documentation

## 2021-01-08 DIAGNOSIS — C50911 Malignant neoplasm of unspecified site of right female breast: Secondary | ICD-10-CM

## 2021-01-08 DIAGNOSIS — Z7189 Other specified counseling: Secondary | ICD-10-CM | POA: Diagnosis not present

## 2021-01-08 DIAGNOSIS — M4853XA Collapsed vertebra, not elsewhere classified, cervicothoracic region, initial encounter for fracture: Secondary | ICD-10-CM | POA: Diagnosis not present

## 2021-01-08 DIAGNOSIS — C7951 Secondary malignant neoplasm of bone: Secondary | ICD-10-CM | POA: Diagnosis not present

## 2021-01-08 DIAGNOSIS — Z171 Estrogen receptor negative status [ER-]: Secondary | ICD-10-CM | POA: Insufficient documentation

## 2021-01-08 DIAGNOSIS — C787 Secondary malignant neoplasm of liver and intrahepatic bile duct: Secondary | ICD-10-CM | POA: Diagnosis not present

## 2021-01-08 DIAGNOSIS — C50411 Malignant neoplasm of upper-outer quadrant of right female breast: Secondary | ICD-10-CM

## 2021-01-08 DIAGNOSIS — Z803 Family history of malignant neoplasm of breast: Secondary | ICD-10-CM | POA: Insufficient documentation

## 2021-01-08 DIAGNOSIS — Z8049 Family history of malignant neoplasm of other genital organs: Secondary | ICD-10-CM | POA: Diagnosis not present

## 2021-01-08 DIAGNOSIS — D563 Thalassemia minor: Secondary | ICD-10-CM

## 2021-01-08 DIAGNOSIS — Z1501 Genetic susceptibility to malignant neoplasm of breast: Secondary | ICD-10-CM

## 2021-01-08 DIAGNOSIS — C50211 Malignant neoplasm of upper-inner quadrant of right female breast: Secondary | ICD-10-CM

## 2021-01-08 LAB — COMPREHENSIVE METABOLIC PANEL
ALT: 56 U/L — ABNORMAL HIGH (ref 0–44)
AST: 168 U/L — ABNORMAL HIGH (ref 15–41)
Albumin: 3 g/dL — ABNORMAL LOW (ref 3.5–5.0)
Alkaline Phosphatase: 496 U/L — ABNORMAL HIGH (ref 38–126)
Anion gap: 16 — ABNORMAL HIGH (ref 5–15)
BUN: 13 mg/dL (ref 6–20)
CO2: 25 mmol/L (ref 22–32)
Calcium: 11 mg/dL — ABNORMAL HIGH (ref 8.9–10.3)
Chloride: 95 mmol/L — ABNORMAL LOW (ref 98–111)
Creatinine, Ser: 0.77 mg/dL (ref 0.44–1.00)
GFR, Estimated: >60 mL/min (ref >60)
Glucose, Bld: 84 mg/dL (ref 70–99)
Potassium: 4.2 mmol/L (ref 3.5–5.1)
Sodium: 136 mmol/L (ref 135–145)
Total Bilirubin: 2.8 mg/dL — ABNORMAL HIGH (ref 0.3–1.2)
Total Protein: 7.2 g/dL (ref 6.5–8.1)

## 2021-01-08 LAB — CBC WITH DIFFERENTIAL/PLATELET
Abs Immature Granulocytes: 0.07 10*3/uL (ref 0.00–0.07)
Basophils Absolute: 0.1 10*3/uL (ref 0.0–0.1)
Basophils Relative: 1 %
Eosinophils Absolute: 0 10*3/uL (ref 0.0–0.5)
Eosinophils Relative: 1 %
HCT: 34.7 % — ABNORMAL LOW (ref 36.0–46.0)
Hemoglobin: 12.3 g/dL (ref 12.0–15.0)
Immature Granulocytes: 1 %
Lymphocytes Relative: 20 %
Lymphs Abs: 1.5 10*3/uL (ref 0.7–4.0)
MCH: 26.7 pg (ref 26.0–34.0)
MCHC: 35.4 g/dL (ref 30.0–36.0)
MCV: 75.3 fL — ABNORMAL LOW (ref 80.0–100.0)
Monocytes Absolute: 0.7 10*3/uL (ref 0.1–1.0)
Monocytes Relative: 9 %
Neutro Abs: 5.3 10*3/uL (ref 1.7–7.7)
Neutrophils Relative %: 68 %
Platelets: 320 10*3/uL (ref 150–400)
RBC: 4.61 MIL/uL (ref 3.87–5.11)
RDW: 16 % — ABNORMAL HIGH (ref 11.5–15.5)
WBC: 7.6 10*3/uL (ref 4.0–10.5)
nRBC: 0 % (ref 0.0–0.2)

## 2021-01-08 NOTE — Progress Notes (Signed)
DISCONTINUE ON PATHWAY REGIMEN - Breast     A cycle is every 14 days (cycles 1-4):     Doxorubicin      Cyclophosphamide      Pegfilgrastim-xxxx    A cycle is every 21 days (cycles 5-8):     Paclitaxel      Carboplatin   **Always confirm dose/schedule in your pharmacy ordering system**  REASON: Other Reason PRIOR TREATMENT: [id number]: Dose-Dense AC [Doxorubicin + Cyclophosphamide q14 Days x 4 Cycles], Followed by Paclitaxel 80 mg/m2 Weekly + Carboplatin AUC=6 q21 Days x 12 Weeks TREATMENT RESPONSE: Unable to Evaluate  START OFF PATHWAY REGIMEN - Breast   [id number]:Nab-Paclitaxel 100 mg/m2 IV D1,8,15 q28 Days + Pembrolizumab 200 mg IV D1 q21 Days:   Pembrolizumab: A cycle is every 21 days:     Pembrolizumab    Chemotherapy: A cycle is every 28 days:     Nab-paclitaxel (protein bound)   **Always confirm dose/schedule in your pharmacy ordering system**  Patient Characteristics: Distant Metastases or Locoregional Recurrent Disease - Unresected or Locally Advanced Unresectable Disease Progressing after Neoadjuvant and Local Therapies, HER2 Negative/Unknown/Equivocal, ER Negative/Unknown, Molecular Alterations (BRCAm, MSI-H/dMMR,  TMB-H, or NTRK Gene Fusion), BRCA-Mutated Therapeutic Status: Distant Metastases ER Status: Negative (-) HER2 Status: Negative (-) PR Status: Negative (-) Therapy Approach Indicated: Molecular Alteration Present (BRCA-Mutated, MSI-H/dMMR, TMB-H, or NTRK Gene Fusion-Positive) BRCA Mutation Status: Present (Germline) Microsatellite/Mismatch Repair Status: MSI-H/dMMR NTRK Gene Fusion Status: Quantity Not Sufficient Other Mutations/Biomarkers: Yes Tumor Mutational Burden (TMB): TMB-H Intent of Therapy: Non-Curative / Palliative Intent, Discussed with Patient

## 2021-01-09 LAB — TSH: TSH: 1.68 u[IU]/mL (ref 0.308–3.960)

## 2021-01-09 LAB — CANCER ANTIGEN 27.29: CA 27.29: 142.9 U/mL — ABNORMAL HIGH (ref 0.0–38.6)

## 2021-01-09 LAB — T4: T4, Total: 15.6 ug/dL — ABNORMAL HIGH (ref 4.5–12.0)

## 2021-01-10 ENCOUNTER — Inpatient Hospital Stay: Payer: BLUE CROSS/BLUE SHIELD

## 2021-01-10 ENCOUNTER — Ambulatory Visit: Payer: BLUE CROSS/BLUE SHIELD

## 2021-01-10 ENCOUNTER — Telehealth: Payer: Self-pay | Admitting: *Deleted

## 2021-01-10 ENCOUNTER — Telehealth: Payer: Self-pay | Admitting: Oncology

## 2021-01-10 ENCOUNTER — Other Ambulatory Visit: Payer: Self-pay | Admitting: Oncology

## 2021-01-10 ENCOUNTER — Other Ambulatory Visit: Payer: Self-pay

## 2021-01-10 VITALS — BP 122/82 | HR 98 | Temp 98.2°F | Resp 18

## 2021-01-10 DIAGNOSIS — Z171 Estrogen receptor negative status [ER-]: Secondary | ICD-10-CM

## 2021-01-10 DIAGNOSIS — Z95828 Presence of other vascular implants and grafts: Secondary | ICD-10-CM

## 2021-01-10 DIAGNOSIS — C50211 Malignant neoplasm of upper-inner quadrant of right female breast: Secondary | ICD-10-CM

## 2021-01-10 DIAGNOSIS — C7951 Secondary malignant neoplasm of bone: Secondary | ICD-10-CM

## 2021-01-10 DIAGNOSIS — C50911 Malignant neoplasm of unspecified site of right female breast: Secondary | ICD-10-CM

## 2021-01-10 DIAGNOSIS — C787 Secondary malignant neoplasm of liver and intrahepatic bile duct: Secondary | ICD-10-CM

## 2021-01-10 MED ORDER — DENOSUMAB 120 MG/1.7ML ~~LOC~~ SOLN
SUBCUTANEOUS | Status: AC
  Filled 2021-01-10: qty 1.7

## 2021-01-10 MED ORDER — DENOSUMAB 120 MG/1.7ML ~~LOC~~ SOLN
120.0000 mg | Freq: Once | SUBCUTANEOUS | Status: AC
  Administered 2021-01-10: 120 mg via SUBCUTANEOUS

## 2021-01-10 NOTE — Telephone Encounter (Addendum)
This RN spoke to pt per her call wanting to inquire what infusion Dr Jana Hakim wants her to get ( per call from scheduling she declined appt today for zometa )  This RN explained calcium level of 11 per lab drawn late yesterday- including why and possible outcome of life threatening symptoms of not treated.  Informed her zometa is not a chemotherapy - but a bisphosphonate - and how it works.  This RN verified that pt is not taking calcium supplements at home.   Pt verbalized understanding and agreement to coming in today at 1430.  Pt understands to ask for this RN if she has further questions with therapy today.

## 2021-01-10 NOTE — Progress Notes (Signed)
Krista [name]'s Ca++ is 11.0. Given her untreated bone metastases this is likely to get worse. I have written for her to receive Xgeva or Zometa (we are checking w her insurance) ASAP but I was unable to leave her a voice message today, sent an email instead, copied below.  Good morning Krista [name]--I tried to call you but you voicemail is full. - Your labs show a high blood calcium level. This can lead to sleepiness, confusion and eventually coma. It is controlled with a shot (Xgeva) or short infusion (Zometa) and I have put the order in so we can get approval from your insurance. We should be able to treat you today if you are agreeable. Please let me know when it would be best for you to stop in--you can call my nurse at [phone] or page me at [phone] or call the front office [phone]) and ask for "scheduling". Note they will not be able to call you for an appointment as the voicemail is full.  Lurline Del MD  ADDENDUM: Krista [name] came this afternoon for a denosumab/Xgeva dose, which was approved by her insurance.  I met with her briefly outside the treatment waiting area.  We went over the possible side effects toxicities and complications of Xgeva including the rare cases of osteonecrosis of the jaw, the more common bony aches and pains and the possibility of hypocalcemia.  I also gave her this information in writing and also information on olaparib in writing.  I am hopeful that she will at least agree to consider that medication  She told me after reading the notes that I gave her she is interested in proceeding to a brain study and a liver biopsy.  I have entered those orders.  I let her know that her voicemail was full and they are not going to be able to contact her unless she empties it.  She is planning to do that today.  She is scheduled for repeat lab and Xgeva on 02/07/2021, with a visit same day.

## 2021-01-10 NOTE — Telephone Encounter (Signed)
Contacted patient about schelduling her infusion, patient said she did not want infusion.

## 2021-01-10 NOTE — Patient Instructions (Signed)
Denosumab injection What is this medication? DENOSUMAB (den oh sue mab) slows bone breakdown. Prolia is used to treat osteoporosis in women after menopause and in men, and in people who are taking corticosteroids for 6 months or more. Delton See is used to treat a high calcium level due to cancer and to prevent bone fractures and other bone problems caused by multiple myeloma or cancer bone metastases. Delton See is also used totreat giant cell tumor of the bone. This medicine may be used for other purposes; ask your health care provider orpharmacist if you have questions. COMMON BRAND NAME(S): Prolia, XGEVA What should I tell my care team before I take this medication? They need to know if you have any of these conditions: dental disease having surgery or tooth extraction infection kidney disease low levels of calcium or Vitamin D in the blood malnutrition on hemodialysis skin conditions or sensitivity thyroid or parathyroid disease an unusual reaction to denosumab, other medicines, foods, dyes, or preservatives pregnant or trying to get pregnant breast-feeding How should I use this medication? This medicine is for injection under the skin. It is given by a health careprofessional in a hospital or clinic setting. A special MedGuide will be given to you before each treatment. Be sure to readthis information carefully each time. For Prolia, talk to your pediatrician regarding the use of this medicine in children. Special care may be needed. For Delton See, talk to your pediatrician regarding the use of this medicine in children. While this drug may be prescribed for children as young as 13 years for selected conditions,precautions do apply. Overdosage: If you think you have taken too much of this medicine contact apoison control center or emergency room at once. NOTE: This medicine is only for you. Do not share this medicine with others. What if I miss a dose? It is important not to miss your dose. Call  your doctor or health careprofessional if you are unable to keep an appointment. What may interact with this medication? Do not take this medicine with any of the following medications: other medicines containing denosumab This medicine may also interact with the following medications: medicines that lower your chance of fighting infection steroid medicines like prednisone or cortisone This list may not describe all possible interactions. Give your health care provider a list of all the medicines, herbs, non-prescription drugs, or dietary supplements you use. Also tell them if you smoke, drink alcohol, or use illegaldrugs. Some items may interact with your medicine. What should I watch for while using this medication? Visit your doctor or health care professional for regular checks on your progress. Your doctor or health care professional may order blood tests andother tests to see how you are doing. Call your doctor or health care professional for advice if you get a fever, chills or sore throat, or other symptoms of a cold or flu. Do not treat yourself. This drug may decrease your body's ability to fight infection. Try toavoid being around people who are sick. You should make sure you get enough calcium and vitamin D while you are taking this medicine, unless your doctor tells you not to. Discuss the foods you eatand the vitamins you take with your health care professional. See your dentist regularly. Brush and floss your teeth as directed. Before youhave any dental work done, tell your dentist you are receiving this medicine. Do not become pregnant while taking this medicine or for 5 months after stopping it. Talk with your doctor or health care professional about your  birth control options while taking this medicine. Women should inform their doctor if they wish to become pregnant or think they might be pregnant. There is a potential for serious side effects to an unborn child. Talk to your health  careprofessional or pharmacist for more information. What side effects may I notice from receiving this medication? Side effects that you should report to your doctor or health care professionalas soon as possible: allergic reactions like skin rash, itching or hives, swelling of the face, lips, or tongue bone pain breathing problems dizziness jaw pain, especially after dental work redness, blistering, peeling of the skin signs and symptoms of infection like fever or chills; cough; sore throat; pain or trouble passing urine signs of low calcium like fast heartbeat, muscle cramps or muscle pain; pain, tingling, numbness in the hands or feet; seizures unusual bleeding or bruising unusually weak or tired Side effects that usually do not require medical attention (report to yourdoctor or health care professional if they continue or are bothersome): constipation diarrhea headache joint pain loss of appetite muscle pain runny nose tiredness upset stomach This list may not describe all possible side effects. Call your doctor for medical advice about side effects. You may report side effects to FDA at1-800-FDA-1088. Where should I keep my medication? This medicine is only given in a clinic, doctor's office, or other health caresetting and will not be stored at home. NOTE: This sheet is a summary. It may not cover all possible information. If you have questions about this medicine, talk to your doctor, pharmacist, orhealth care provider.  2022 Elsevier/Gold Standard (2017-10-23 16:10:44)

## 2021-01-11 ENCOUNTER — Telehealth (HOSPITAL_COMMUNITY): Payer: Self-pay

## 2021-01-14 ENCOUNTER — Encounter (HOSPITAL_COMMUNITY): Payer: Self-pay

## 2021-01-14 ENCOUNTER — Telehealth (HOSPITAL_COMMUNITY): Payer: Self-pay

## 2021-01-14 NOTE — Progress Notes (Unsigned)
Patient Demographics  Patient Name  Krista [name] Legal Sex  Female DOB  1976/06/03 SSN  [id number] Address  [address]  [place] [address] Phone  [phone] [facility])  [phone] (Mobile) *Preferred*     RE: biopsy Due: Tomorrow Received: 3 days ago  Call patient [name], MD  [name]   [place] core liver lesion bx  R/o met   DDH         Previous Messages    Comments  Left msg twice.

## 2021-01-15 ENCOUNTER — Ambulatory Visit (HOSPITAL_COMMUNITY)
Admission: RE | Admit: 2021-01-15 | Discharge: 2021-01-15 | Disposition: A | Discharge status: Home / Self Care | Payer: BLUE CROSS/BLUE SHIELD | Source: Ambulatory Visit | Attending: Oncology | Admitting: Oncology

## 2021-01-15 DIAGNOSIS — Z171 Estrogen receptor negative status [ER-]: Secondary | ICD-10-CM | POA: Insufficient documentation

## 2021-01-15 DIAGNOSIS — Z1501 Genetic susceptibility to malignant neoplasm of breast: Secondary | ICD-10-CM | POA: Diagnosis present

## 2021-01-15 DIAGNOSIS — C50911 Malignant neoplasm of unspecified site of right female breast: Secondary | ICD-10-CM | POA: Insufficient documentation

## 2021-01-15 DIAGNOSIS — C50211 Malignant neoplasm of upper-inner quadrant of right female breast: Secondary | ICD-10-CM

## 2021-01-15 DIAGNOSIS — C7951 Secondary malignant neoplasm of bone: Secondary | ICD-10-CM | POA: Diagnosis present

## 2021-01-15 DIAGNOSIS — C787 Secondary malignant neoplasm of liver and intrahepatic bile duct: Secondary | ICD-10-CM | POA: Diagnosis present

## 2021-01-15 MED ORDER — GADOBUTROL 1 MMOL/ML IV SOLN
8.0000 mL | Freq: Once | INTRAVENOUS | Status: AC | PRN
  Administered 2021-01-15: 8 mL via INTRAVENOUS

## 2021-01-21 ENCOUNTER — Other Ambulatory Visit: Payer: Self-pay | Admitting: Oncology

## 2021-01-21 NOTE — Progress Notes (Signed)
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  These are not causing her any symptoms at present.  (I also apologize for not calling earlier--I have been out of town).  I suggested a referral to our neuro oncologist Dr. Mickeal Skinner and she is agreeable.  I have sent them a note requesting that  I also asked her if she had read up on the olaparib.  She tells me that she is not planning to take that medication or any form of chemotherapy.  She is going "a different route".  However she does want to have the liver biopsy that we discussed and she also does want [place] to keep an eye on the hypercalcemia issue.  She is scheduled to return 02/07/2021 for labs seeing me, and repeat denosumab/Xgeva depending on the calcium results

## 2021-01-22 ENCOUNTER — Other Ambulatory Visit: Payer: Self-pay | Admitting: Radiation Therapy

## 2021-01-24 ENCOUNTER — Encounter: Payer: Self-pay | Admitting: Oncology

## 2021-01-25 ENCOUNTER — Other Ambulatory Visit: Payer: Self-pay | Admitting: Radiology

## 2021-01-28 ENCOUNTER — Other Ambulatory Visit: Payer: Self-pay | Admitting: Radiation Therapy

## 2021-01-28 ENCOUNTER — Encounter (HOSPITAL_COMMUNITY): Payer: Self-pay

## 2021-01-28 ENCOUNTER — Ambulatory Visit (HOSPITAL_COMMUNITY)
Admission: RE | Admit: 2021-01-28 | Discharge: 2021-01-28 | Disposition: A | Discharge status: Home / Self Care | Payer: BLUE CROSS/BLUE SHIELD | Source: Ambulatory Visit | Attending: Oncology | Admitting: Oncology

## 2021-01-28 ENCOUNTER — Other Ambulatory Visit: Payer: Self-pay

## 2021-01-28 DIAGNOSIS — C7931 Secondary malignant neoplasm of brain: Secondary | ICD-10-CM

## 2021-01-28 DIAGNOSIS — C50211 Malignant neoplasm of upper-inner quadrant of right female breast: Secondary | ICD-10-CM

## 2021-01-28 DIAGNOSIS — M899 Disorder of bone, unspecified: Secondary | ICD-10-CM | POA: Diagnosis not present

## 2021-01-28 DIAGNOSIS — R160 Hepatomegaly, not elsewhere classified: Secondary | ICD-10-CM | POA: Diagnosis present

## 2021-01-28 DIAGNOSIS — G939 Disorder of brain, unspecified: Secondary | ICD-10-CM | POA: Insufficient documentation

## 2021-01-28 DIAGNOSIS — Z853 Personal history of malignant neoplasm of breast: Secondary | ICD-10-CM | POA: Diagnosis not present

## 2021-01-28 DIAGNOSIS — C787 Secondary malignant neoplasm of liver and intrahepatic bile duct: Secondary | ICD-10-CM | POA: Insufficient documentation

## 2021-01-28 DIAGNOSIS — C50911 Malignant neoplasm of unspecified site of right female breast: Secondary | ICD-10-CM

## 2021-01-28 DIAGNOSIS — Z1501 Genetic susceptibility to malignant neoplasm of breast: Secondary | ICD-10-CM

## 2021-01-28 DIAGNOSIS — Z171 Estrogen receptor negative status [ER-]: Secondary | ICD-10-CM | POA: Insufficient documentation

## 2021-01-28 DIAGNOSIS — C7951 Secondary malignant neoplasm of bone: Secondary | ICD-10-CM

## 2021-01-28 LAB — CBC WITH DIFFERENTIAL/PLATELET
Abs Immature Granulocytes: 0.19 10*3/uL — ABNORMAL HIGH (ref 0.00–0.07)
Basophils Absolute: 0.1 10*3/uL (ref 0.0–0.1)
Basophils Relative: 1 %
Eosinophils Absolute: 0 10*3/uL (ref 0.0–0.5)
Eosinophils Relative: 1 %
HCT: 34.6 % — ABNORMAL LOW (ref 36.0–46.0)
Hemoglobin: 12.2 g/dL (ref 12.0–15.0)
Immature Granulocytes: 2 %
Lymphocytes Relative: 14 %
Lymphs Abs: 1.2 10*3/uL (ref 0.7–4.0)
MCH: 26.9 pg (ref 26.0–34.0)
MCHC: 35.3 g/dL (ref 30.0–36.0)
MCV: 76.4 fL — ABNORMAL LOW (ref 80.0–100.0)
Monocytes Absolute: 0.9 10*3/uL (ref 0.1–1.0)
Monocytes Relative: 11 %
Neutro Abs: 6 10*3/uL (ref 1.7–7.7)
Neutrophils Relative %: 71 %
Platelets: 295 10*3/uL (ref 150–400)
RBC: 4.53 MIL/uL (ref 3.87–5.11)
RDW: 18.3 % — ABNORMAL HIGH (ref 11.5–15.5)
WBC: 8.4 10*3/uL (ref 4.0–10.5)
nRBC: 0.4 % — ABNORMAL HIGH (ref 0.0–0.2)

## 2021-01-28 LAB — COMPREHENSIVE METABOLIC PANEL
ALT: 70 U/L — ABNORMAL HIGH (ref 0–44)
AST: 215 U/L — ABNORMAL HIGH (ref 15–41)
Albumin: 2.6 g/dL — ABNORMAL LOW (ref 3.5–5.0)
Alkaline Phosphatase: 652 U/L — ABNORMAL HIGH (ref 38–126)
Anion gap: 16 — ABNORMAL HIGH (ref 5–15)
BUN: 14 mg/dL (ref 6–20)
CO2: 25 mmol/L (ref 22–32)
Calcium: 8.1 mg/dL — ABNORMAL LOW (ref 8.9–10.3)
Chloride: 95 mmol/L — ABNORMAL LOW (ref 98–111)
Creatinine, Ser: 0.75 mg/dL (ref 0.44–1.00)
GFR, Estimated: >60 mL/min (ref >60)
Glucose, Bld: 87 mg/dL (ref 70–99)
Potassium: 3.3 mmol/L — ABNORMAL LOW (ref 3.5–5.1)
Sodium: 136 mmol/L (ref 135–145)
Total Bilirubin: 4.4 mg/dL — ABNORMAL HIGH (ref 0.3–1.2)
Total Protein: 7.1 g/dL (ref 6.5–8.1)

## 2021-01-28 LAB — PROTIME-INR
INR: 1.1 (ref 0.8–1.2)
Prothrombin Time: 13.7 seconds (ref 11.4–15.2)

## 2021-01-28 MED ORDER — MIDAZOLAM HCL 2 MG/2ML IJ SOLN
INTRAMUSCULAR | Status: AC | PRN
  Administered 2021-01-28 (×2): 1 mg via INTRAVENOUS

## 2021-01-28 MED ORDER — LIDOCAINE HCL 1 % IJ SOLN
INTRAMUSCULAR | Status: AC
  Filled 2021-01-28: qty 20

## 2021-01-28 MED ORDER — FENTANYL CITRATE (PF) 100 MCG/2ML IJ SOLN
INTRAMUSCULAR | Status: AC
  Filled 2021-01-28: qty 2

## 2021-01-28 MED ORDER — MIDAZOLAM HCL 2 MG/2ML IJ SOLN
INTRAMUSCULAR | Status: AC
  Filled 2021-01-28: qty 4

## 2021-01-28 MED ORDER — GELATIN ABSORBABLE 12-7 MM EX MISC
CUTANEOUS | Status: AC
  Filled 2021-01-28: qty 1

## 2021-01-28 MED ORDER — SODIUM CHLORIDE 0.9 % IV SOLN: INTRAVENOUS | Status: DC

## 2021-01-28 MED ORDER — FENTANYL CITRATE (PF) 100 MCG/2ML IJ SOLN
INTRAMUSCULAR | Status: AC | PRN
  Administered 2021-01-28 (×2): 50 ug via INTRAVENOUS

## 2021-01-28 MED ORDER — LIDOCAINE HCL (PF) 1 % IJ SOLN
INTRAMUSCULAR | Status: AC | PRN
  Administered 2021-01-28: 10 mL

## 2021-01-28 NOTE — Discharge Instructions (Signed)
Moderate Conscious Sedation, Adult, Care After This sheet gives you information about how to care for yourself after your procedure. Your health care provider may also give you more specific instructions. If you have problems or questions, contact your health careprovider. What can I expect after the procedure? After the procedure, it is common to have: Sleepiness for several hours. Impaired judgment for several hours. Difficulty with balance. Vomiting if you eat too soon. Follow these instructions at home: For the time period you were told by your health care provider: Rest. Do not participate in activities where you could fall or become injured. Do not drive or use machinery. Do not drink alcohol. Do not take sleeping pills or medicines that cause drowsiness. Do not make important decisions or sign legal documents. Do not take care of children on your own. Eating and drinking  Follow the diet recommended by your health care provider. Drink enough fluid to keep your urine pale yellow. If you vomit: Drink water, juice, or soup when you can drink without vomiting. Make sure you have little or no nausea before eating solid foods.  General instructions Take over-the-counter and prescription medicines only as told by your health care provider. Have a responsible adult stay with you for the time you are told. It is important to have someone help care for you until you are awake and alert. Do not smoke. Keep all follow-up visits as told by your health care provider. This is important. Contact a health care provider if: You are still sleepy or having trouble with balance after 24 hours. You feel light-headed. You keep feeling nauseous or you keep vomiting. You develop a rash. You have a fever. You have redness or swelling around the IV site. Get help right away if: You have trouble breathing. You have new-onset confusion at home. Summary After the procedure, it is common to feel  sleepy, have impaired judgment, or feel nauseous if you eat too soon. Rest after you get home. Know the things you should not do after the procedure. Follow the diet recommended by your health care provider and drink enough fluid to keep your urine pale yellow. Get help right away if you have trouble breathing or new-onset confusion at home. This information is not intended to replace advice given to you by your health care provider. Make sure you discuss any questions you have with your healthcare provider.    Liver Biopsy, Care After After a liver biopsy, it is common to have these things in the area where the biopsy was done. You may: Have pain. Feel sore. Have bruising. You may also feel tired for a few days. Follow these instructions at home: Medicines Take over-the-counter and prescription medicines only as told by your doctor. If you were prescribed an antibiotic medicine, take it as told by your doctor. Do not stop taking the antibiotic, even if you start to feel better. Do not take medicines that may thin your blood. These medicines include aspirin and ibuprofen. Take them only if your doctor tells you to. If told, take steps to prevent problems with pooping (constipation). You may need to: Drink enough fluid to keep your pee (urine) pale yellow. Take medicines. You will be told what medicines to take. Eat foods that are high in fiber. These include beans, whole grains, and fresh fruits and vegetables. Limit foods that are high in fat and sugar. These include fried or sweet foods. Ask your doctor if you should avoid driving or using machines while  you are taking your medicine. Caring for your incision Follow instructions from your doctor about how to take care of your cut from surgery (incisions). Make sure you: Wash your hands with soap and water for at least 20 seconds before and after you change your bandage. If you cannot use soap and water, use hand sanitizer. Change your  bandage. Leavestitches or skin glue in place for at least two weeks. Leave tape strips alone unless you are told to take them off. You may trim the edges of the tape strips if they curl up. Check your incision every day for signs of infection. Check for: Redness, swelling, or more pain. Fluid or blood. Warmth. Pus or a bad smell. Do not take baths, swim, or use a hot tub. Ask your doctor about taking showers or sponge baths. Activity Rest at home for 1-2 days, or as told by your doctor. Get up to take short walks every 1 to 2 hours. Ask for help if you feel weak or unsteady. Do not lift anything that is heavier than 10 lb (4.5 kg), or the limit that you are told. Do not play contact sports for 2 weeks after the procedure. Return to your normal activities as told by your doctor. Ask what activities are safe for you. General instructions Do not drink alcohol in the first week after the procedure. Plan to have a responsible adult care for you for the time you are told after you leave the hospital or clinic. This is important. It is up to you to get the results of your procedure. Ask how to get your results when they are ready. Keep all follow-up visits.   Contact a doctor if: You have more bleeding in your incision. Your incision swells, or is red and more painful. You have fluid that comes from your incision. You develop a rash. You have fever or chills. Get help right away if: You have swelling, bloating, or pain in your belly (abdomen). You get dizzy or faint. You vomit or you feel like vomiting. You have trouble breathing or feel short of breath. You have chest pain. You have problems talking or seeing. You have trouble with your balance or moving your arms or legs. These symptoms may be an emergency. Get help right away. Call your local emergency services (911 in the [place] Do not wait to see if the symptoms will go away. Do not drive yourself to the hospital. Summary After  the procedure, it is common to have pain, soreness, bruising, and tiredness. Your doctor will tell you how to take care of yourself at home. Change your bandage, take your medicines, and limit your activities as told by your doctor. Call your doctor if you have symptoms of infection. Get help right away if your belly swells, your cut bleeds a lot, or you have trouble talking or breathing. This information is not intended to replace advice given to you by your health care provider. Make sure you discuss any questions you have with your healthcare provider. Document Revised: 04/30/2020 Document Reviewed: 04/30/2020 Elsevier Patient Education  2022 [employer].

## 2021-01-28 NOTE — Procedures (Signed)
Interventional Radiology Procedure Note  Procedure: Ultrasound guided focal liver biopsy  Findings: Please refer to procedural dictation for full description. Left lobe focal mass biopsy, 18 ga core x3.  Gelfoam slurry needle track embolization.  Complications: None immediate  Estimated Blood Loss: < 5 mL  Recommendations: 3 hours bedrest Follow up Pathology results.   Ruthann Cancer, MD

## 2021-01-28 NOTE — Progress Notes (Signed)
Order Placed to access port for Brain MRI at GI.   Mont Dutton R.T.(R)(T) Radiation Special Procedures Navigator

## 2021-01-28 NOTE — H&P (Signed)
Referring Physician(s): Chauncey Cruel  Supervising Physician: Ruthann Cancer  Patient Status:  [facility]  Chief Complaint:  "I'm having a liver biopsy"  Subjective: Patient familiar to IR service from Port-A-Cath placement as well as left lobe liver lesion biopsy in 2021.  She has a history of right breast cancer in 2021, status post lumpectomy with chemoradiation.  Recent imaging reveals disease progression with new multifocal liver lesions as well as bony and brain lesions.  She presents today for image guided liver lesion biopsy to evaluate prognostic profile.  She currently denies fever, headache, chest pain, dyspnea, cough; she does have fatigue, occasional right upper quadrant discomfort, intermittent low back pain, occasional nausea and vomiting and constipation with occ blood in stool?  Hemorrhoidal.  Additional history as below.  Past Medical History:  Diagnosis Date   Anemia    history of   Cancer (Chauncey)    right breast cancer 10/03/19, received chemotherapy   Family history of breast cancer    Family history of cervical cancer    Family history of lung cancer    Hemorrhoids    Past Surgical History:  Procedure Laterality Date   AXILLARY LYMPH NODE DISSECTION Right 05/10/2020   Procedure: RIGHT AXILLARY LYMPH NODE DISSECTION;  Surgeon: Erroll Luna, MD;  Location: [facility];  Service: General;  Laterality: Right;   BREAST LUMPECTOMY WITH RADIOACTIVE SEED AND SENTINEL LYMPH NODE BIOPSY Right 04/05/2020   Procedure: RIGHT BREAST BRACKETED LUMPECTOMY WITH RADIOACTIVE SEED AND RIGHT AXILLARY  TARGETED LYMPH NODE BIOPSY AND SENTINEL LYMPH NODE MAPPING;  Surgeon: Erroll Luna, MD;  Location: [place];  Service: General;  Laterality: Right;  PEC BLOCK   IR IMAGING GUIDED PORT INSERTION  11/07/2019   PORT-A-CATH REMOVAL Left 04/05/2020   Procedure: REMOVAL PORT-A-CATH;  Surgeon: Erroll Luna, MD;  Location: [place];  Service: General;  Laterality: Left;       Allergies: Emend [fosaprepitant]  Medications: Prior to Admission medications   Medication Sig Start Date End Date Taking? Authorizing Provider  Biotin w/ Vitamins C & E (HAIR/SKIN/NAILS PO) Take 1 tablet by mouth daily.   Yes [provider]  Cholecalciferol (VITAMIN D3) 250 MCG (10000 UT) capsule Take 10,000 Units by mouth daily.   Yes [provider]  ferrous gluconate (FERGON) 324 MG tablet Take 1 tablet (324 mg total) by mouth daily with breakfast. 02/28/20  Yes Magrinat, [name], MD  Misc Natural Products (PHYTOCILLIN) CAPS Take 2 capsules by mouth daily.   Yes [provider]  OVER THE COUNTER MEDICATION Take 10 mLs by mouth 2 (two) times daily. floradix otc supplement   Yes [provider]  OVER THE COUNTER MEDICATION Take 2 capsules by mouth daily. kyolic aged garlic otc supplement   Yes [provider]  acetaminophen (TYLENOL) 500 MG tablet Take 1-2 tablets (500-1,000 mg total) by mouth every 6 (six) hours as needed. Patient not taking: No sig reported 11/07/19   Magrinat, [name], MD  capecitabine (XELODA) 500 MG tablet TAKE 3 TABLETS (1500 MG) BY MOUTH 2 TIMES DAILY, START ON 10/04/2020 AND CONTINUE FOR 14 DAYS THEN OFF A WEEK, THEN REPEAT 10/23/20 10/23/21  Magrinat, [name], MD  prochlorperazine (COMPAZINE) 10 MG tablet Take 1 tablet (10 mg total) by mouth every 6 (six) hours as needed (Nausea or vomiting). Patient not taking: Reported on 03/22/2020 10/17/19 06/01/20  Magrinat, [name], MD     Vital Signs: BP 107/74   Pulse (!) 113  Temp 98.2 F (36.8 C) (Oral)   Resp 18   SpO2 100%   Physical Exam awake, slightly drowsy but answering questions okay.  Chest clear to auscultation bilaterally.  Heart with slightly tachycardic but regular rhythm.  Abdomen soft, positive bowel sounds, some mild periumbilical tenderness to palpation.  Bilateral pretibial edema noted.  Imaging: No results found.  Labs:  CBC: Recent Labs     10/23/20 1029 11/15/20 1051 12/17/20 1144 01/08/21 1607  WBC 5.9 5.7 7.0 7.6  HGB 11.7* 11.8* 11.6* 12.3  HCT 32.7* 33.4* 33.1* 34.7*  PLT 247 268 275 320    COAGS: No results for input(s): INR, APTT in the last 8760 hours.  BMP: Recent Labs    02/14/20 0945 02/21/20 1051 02/28/20 1034 03/27/20 1008 04/17/20 0843 10/23/20 1029 11/15/20 1051 12/17/20 1149 01/08/21 1607  NA 141 141 140 141   < > 142 144 139 136  K 3.9 3.7 3.8 3.8   < > 3.9 4.1 4.1 4.2  CL 110 107 108 105   < > 106 104 103 95*  CO2 '23 26 26 27   '$ < > '26 29 27 25  '$ GLUCOSE 98 106* 123* 101*   < > 115* 89 115* 84  BUN '8 9 6 6   '$ < > '10 10 13 13  '$ CALCIUM 9.4 9.6 9.6 9.7   < > 9.3 9.6 9.8 11.0*  CREATININE 0.73 0.76 0.74 0.76   < > 0.81 0.82 0.72 0.77  GFRNONAA >60 >60 >60 >60   < > >60 >60 >60 >60  GFRAA >60 >60 >60 >60  --   --   --   --   --    < > = values in this interval not displayed.    LIVER FUNCTION TESTS: Recent Labs    10/23/20 1029 11/15/20 1051 12/17/20 1149 01/08/21 1607  BILITOT 1.0 1.3* 1.0 2.8*  AST 35 53* 104* 168*  ALT 22 28 53* 56*  ALKPHOS 115 174* 318* 496*  PROT 7.2 7.4 7.4 7.2  ALBUMIN 3.9 3.7 3.5 3.0*    Assessment and Plan: Patient familiar to IR service from Port-A-Cath placement as well as left lobe liver lesion biopsy in 2021.  She has a history of right breast cancer in 2021, status post lumpectomy with chemoradiation.  Recent imaging reveals disease progression with new multifocal liver lesions as well as bony and brain lesions.  She presents today for image guided liver lesion biopsy to evaluate prognostic profile. CA 27.29 now 142.9(29.8). Risks and benefits of procedure was discussed with the patient including, but not limited to bleeding, infection, damage to adjacent structures or low yield requiring additional tests.  All of the questions were answered and there is agreement to proceed.  Consent signed and in chart.  Labs pending  Electronically  Signed: D. Rowe Robert, PA-C 01/28/2021, 12:43 PM   I spent a total of 25 minutes at the the patient's bedside AND on the patient's hospital floor or unit, greater than 50% of which was counseling/coordinating care for image guided liver lesion biopsy

## 2021-01-29 ENCOUNTER — Encounter: Payer: Self-pay | Admitting: Oncology

## 2021-01-29 ENCOUNTER — Other Ambulatory Visit: Payer: Self-pay | Admitting: Radiation Therapy

## 2021-01-29 ENCOUNTER — Telehealth: Payer: Self-pay | Admitting: Radiation Therapy

## 2021-01-29 ENCOUNTER — Other Ambulatory Visit: Payer: Self-pay | Admitting: Oncology

## 2021-01-29 NOTE — Telephone Encounter (Signed)
I called Krista [name] to introduce myself, share the recommendations from the 8/1 Brain and Spine Conference discussion, and info about her scheduled Brain MRI. She asked me to cancel/reschedule the MRI for now. She does not want to have any additional procedures before discussing everything with Dr. Isidore Moos first. She is aware that the [facility] is scheduled the same day as her visit with Dr. Isidore Moos and will let us know that day if she wants to go through with the [facility] or reschedule.   Mont Dutton R.T.(R)(T) Radiation Special Procedures Navigator   Edited Treatment plan:   8/9- Recon with Dr. Isidore Moos, [place] and [facility] (arrive at 12:30)  8/10- Brain MRI - [facility]. Port access at 12:15  8/15 @ 11:15am -Consult with Dr. Reatha Armour  8/16 - [place] [facility]. Plan review at 2:00 and treat at 2:15

## 2021-01-31 ENCOUNTER — Inpatient Hospital Stay: Admission: RE | Admit: 2021-01-31 | Payer: BLUE CROSS/BLUE SHIELD | Source: Ambulatory Visit

## 2021-02-01 LAB — SURGICAL PATHOLOGY

## 2021-02-04 ENCOUNTER — Telehealth: Payer: Self-pay | Admitting: Radiation Oncology

## 2021-02-05 ENCOUNTER — Ambulatory Visit
Admission: RE | Admit: 2021-02-05 | Discharge: 2021-02-05 | Disposition: A | Discharge status: Home / Self Care | Payer: BLUE CROSS/BLUE SHIELD | Source: Ambulatory Visit | Attending: Radiation Oncology | Admitting: Radiation Oncology

## 2021-02-05 ENCOUNTER — Other Ambulatory Visit: Payer: Self-pay

## 2021-02-05 ENCOUNTER — Ambulatory Visit: Payer: BLUE CROSS/BLUE SHIELD | Admitting: Radiation Oncology

## 2021-02-05 ENCOUNTER — Ambulatory Visit: Admission: RE | Admit: 2021-02-05 | Payer: BLUE CROSS/BLUE SHIELD | Source: Ambulatory Visit

## 2021-02-05 ENCOUNTER — Ambulatory Visit: Payer: BLUE CROSS/BLUE SHIELD

## 2021-02-05 DIAGNOSIS — C7951 Secondary malignant neoplasm of bone: Secondary | ICD-10-CM

## 2021-02-05 DIAGNOSIS — C7931 Secondary malignant neoplasm of brain: Secondary | ICD-10-CM

## 2021-02-05 NOTE — Progress Notes (Signed)
Histology and Location of Primary Cancer:  Malignant neoplasm of upper-inner quadrant of right breast in female, estrogen receptor negative   Location(s) of Symptomatic tumor(s):  Liver biopsy 01/28/2021 --FINAL MICROSCOPIC DIAGNOSIS:  A. LIVER, LEFT LOBE, MASS, BIOPSY:  - Metastatic adenocarcinoma. --ADDENDUM:  PROGNOSTIC INDICATOR RESULTS:  --Immunohistochemical and morphometric analysis performed manually  --The tumor cells are EQUIVOCAL for Her2 (2+). Her2 FISH was ordered and will be reported in an addendum.  --Estrogen Receptor:  POSITIVE, 5%, WEAK STAINING  --Progesterone Receptor:   NEGATIVE  Comment: The negative hormone receptor study(ies) in the case does not have an internal positive control. --ADDENDUM:  FLOURESCENCE IN-SITU HYBRIDIZATION RESULTS:  GROUP 4:   HER2 **NEGATIVE**   MRI Brain w/ & w/o Contrast 01/15/2021 --IMPRESSION: 1. Peripherally enhancing 5 mm lesion in the posterior left cerebellar hemisphere, concerning for a metastasis given the patient's known metastatic breast cancer. 2. Additional punctate focus of enhancement within the right cerebellum may represent an additional tiny metastasis. 3. Skull base and calvarial osseous metastatic disease. A 7 mm lesion at the high right vertex obliterates the inner calvarial table and may see extend intracranially to involve the dura. No adjacent brain edema or mass effect   MRI Spine 01/02/2021 --IMPRESSION: 1. Widespread osseous metastases throughout the spine with pathologic fractures at C7, T1, and T7. No epidural tumor. 2. Dominant left sacral metastasis with mild extraosseous tumor extension. 3. Known liver metastases. 4. Fibroid uterus.   Past/Anticipated chemotherapy by medical oncology, if any:  Under care of Dr. Sarajane Jews Magrinat --01/21/2021 (telephone call) I called Krista [name] to give her the results of her brain MRI, which does show what appeared to be very early brain metastases.  These are  not causing her any symptoms at present I suggested a referral to our neuro oncologist Dr. Mickeal Skinner and she is agreeable.  I have sent them a note requesting that  I also asked her if she had read up on the olaparib.  She tells me that she is not planning to take that medication or any form of chemotherapy.  She is going "a different route".  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 adjuvant radiation 06/19/2020 through 08/01/2020 capecitabine sensitization prescribed but not started by the patient post radiation treatment capecitabine planned for 6 months at standard doses starting 08/17/2020, discontinued by patient 12/11/2020 METASTATIC DISEASE: July 2022, involving liver and bones , likely also lungs and nodes recommended Abraxane day 1 day 8 with pembrolizumab day 1, repeated every 21 days; other options were pembrolizumab alone or olaparib also recommended denosumab/Xgeva and kyphoplasty Krista [name] says that she feels there is no point to any of this because she tried everything before and it did not work.  She  understands from our  discussion that she has not tried olaparib or Keytruda. We discussed the fact that if she does not do anything her survival may be brief.  She is at significant risk of liver failure given the extensive involvement of tumor in her liver.   If she does choose no treatment or at least no standard treatment I recommend that we place a hospice referral and they can help her family care for her at home and if that becomes impossible then she could be cared for at [facility]. I also suggested if she was not sure what to do to consider a second opinion and we could help her obtain one with minimal delay   Patient's main complaints related to symptomatic tumor(s) are:  Reports pain to majority or spine/back, as well as sciatica pain down her left leg.     Pain on a scale of 0-10 is: Rates pain as a 7-8 out of 10 when not taking OTC pain medication such as acetaminophen    Dose of Decadron, if applicable: Not currently prescribed   Recent neurologic symptoms, if any: Seizures: Patient denies Headaches: Patient denies Nausea: Patient denies, but does report a decrease in appetite (feels fuller quicker, and reports decrease interest in eating) Dizziness/ataxia: Reports feeling dizzy/off balance all the time Difficulty with hand coordination: States she has difficulty doing fine motor skills while standing (she has to sit to zip a jacket, button a shirt, etc) Focal numbness/weakness: Reports numbness associated with her sciatica down her left leg/foot. Also reports intermittent numbness to her right arm since lymph node dissection Visual deficits/changes: Reports occasional double vision/trouble focusing when her sciatica flare up is particularly intense (states it will resolve on its own) Confusion/Memory deficits: Denies any issues/concerns currently. Reports she did experience a brief bout of feeling like she was talking slower/taking longer to move/communicate   Ambulatory status?  Walker? Wheelchair?: Patient states she is very unstable on her feet, and is not able to ambulate without assistance. She is interested in DME support (particularly wheelchair assistance)   Bowel/Bladder retention or incontinence (please describe): Reports she was experiencing issues with constipation when her neurological symptoms first presented, but she feels this has since corrected. She is not able to feel when she has to urinate until she experiences abdominal pain/discomfort. As a result she makes it a point to urinate at least every 2-3 hours. She denies any episodes of leakage or incontinence, and feels she is able to fully empty her bladder   SAFETY ISSUES: Prior radiation? Yes 06/19/2020 through 08/03/2020 Site Technique Total Dose (Gy) Dose per Fx (Gy) Completed Fx Beam Energies  Breast, Right: Breast_Rt_IMN 3D 50/50 2 25/25 15X  Breast, Right: Breast_Rt_PAB_SCV 3D 50/50 2 25/25 15X  Breast, Right: Breast_Rt_Bst 3D 10/10 2 5/5 6X, 10X    Pacemaker/ICD? No Possible current pregnancy? No Is the patient on methotrexate? No   Additional Complaints / other details:  Scheduled for 3T-MRI on 02/06/21

## 2021-02-05 NOTE — Progress Notes (Signed)
Radiation Oncology         [phone] ________________________________  Outpatient Re-Consultation by telephone.  The patient opted for telemedicine to maximize safety during the pandemic.  MyChart video was not obtainable.   Name: Krista [name] MRN: [id number]  Date: 02/05/2021  DOB: 1975-10-25  DZ:[id number], Levada Dy, MD  Everett Graff, MD   REFERRING PHYSICIAN: Everett Graff, MD  DIAGNOSIS:     ICD-10-CM   1. Metastasis to brain [facility])  C79.31        Malignant neoplasm of upper-inner quadrant of right breast in female estrogen receptor negative, now with new brain, liver, and bony metastases.  HISTORY OF PRESENT ILLNESS::Krista [name] is a 45 y.o. female who was treated by me for right breast cancer early this year. She presents today for re-consultation in regards to new evidence of brain, liver, and bony metastases.   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   The patient underwent the following systemic therapies /procedures in treatment of her right breast cancer --chemotherapy consisting of cyclophosphamide and doxorubicin, followed by paclitaxel and carboplatin, last dose on 02/13/21.  --right lumpectomy with right axillary lymph node biopsy on 04/05/20 --right axillary lymph node dissection on 05/10/20  MRI of the abdomen on 12/27/20 revealed multiple liver metastases as well as multiple bone lesions. Following these findings, Dr. Lindi Adie ordered an MRI of the spine.  MRI of the spine taken on 01/02/21 demonstrated widespread osseous metastases throughout the spine (also visualized on CT from 01/01/21) with pathologic fractures at C7, T1, and T7, as well as dominant left sacral metastasis with mild extraosseous tumor extension. (Known liver metastasis visualized as well).   MRI of the brain on 01/15/21 demonstrated: a peripherally enhancing 5 mm lesion in the posterior left cerebellar  hemisphere; noted to be concerning for metastasis given the patients know metastatic breast cancer. Also seen was skull base and calvarial osseous metastatic disease; with a 7 mm lesion at the high right vertex obliterating the inner calvarial table. An additional focus of enhancement within the right cerebellum was additionally noted to be possibly suggestive of additional tiny metastasis.  Most recently, the patient underwent biopsy of left lobe liver mass. Pathology from the procedure revealed: metastatic adenocarcinoma; with prognostic indicators significant for: ER 5%, positive, with weak staking intensity; PR negative; Her2 negative  Of note: chest CT on 01/01/2021 showed very small pulmonary nodules as well as mediastinal and hilar adenopathy.  Left sciatic pain, difficulty walking, difficulty pushing urine out: she reports these are her main issues symptomatically.  She is not keen on getting an MRI of her brain to plan treatment to the two subcentimeter brain metastases, and she is intent on not continuing any systemic therapy   She is confident that her body is not responsive to any traditional form of systemic therapy and plans to manage her disease with nutritional strategies.   She is open to considering palliative RT to her spine for her sciatic symptoms  PREVIOUS RADIATION THERAPY: Yes Radiation Treatment Dates: 06/19/2020 through 08/03/2020 Site Technique Total Dose (Gy) Dose per Fx (Gy) Completed Fx Beam Energies  Breast, Right: Breast_Rt_IMN 3D 50/50 2 25/25 15X  Breast, Right: Breast_Rt_PAB_SCV 3D 50/50 2 25/25 15X  Breast, Right: Breast_Rt_Bst 3D 10/10 2 5/5 6X, 10X    PAST MEDICAL HISTORY:  has a past medical history of Anemia, Cancer ([place]), Family history of breast cancer, Family history of cervical cancer, Family history of lung cancer, and  Hemorrhoids.    PAST SURGICAL HISTORY: Past Surgical History:  Procedure Laterality Date   AXILLARY LYMPH NODE DISSECTION Right  05/10/2020   Procedure: RIGHT AXILLARY LYMPH NODE DISSECTION;  Surgeon: Erroll Luna, MD;  Location: [place];  Service: General;  Laterality: Right;   BREAST LUMPECTOMY WITH RADIOACTIVE SEED AND SENTINEL LYMPH NODE BIOPSY Right 04/05/2020   Procedure: RIGHT BREAST BRACKETED LUMPECTOMY WITH RADIOACTIVE SEED AND RIGHT AXILLARY  TARGETED LYMPH NODE BIOPSY AND SENTINEL LYMPH NODE MAPPING;  Surgeon: Erroll Luna, MD;  Location: [place];  Service: General;  Laterality: Right;  PEC BLOCK   IR IMAGING GUIDED PORT INSERTION  11/07/2019   PORT-A-CATH REMOVAL Left 04/05/2020   Procedure: REMOVAL PORT-A-CATH;  Surgeon: Erroll Luna, MD;  Location: [place];  Service: General;  Laterality: Left;    FAMILY HISTORY: family history includes Breast cancer (age of onset: 60) in her mother; Breast cancer (age of onset: 19) in her half-sister; Breast cancer (age of onset: 64) in her maternal aunt; Breast cancer (age of onset: 71) in her cousin; Cancer in her maternal great-grandfather and other family members; Cancer (age of onset: 49) in her maternal uncle; Cirrhosis in her half-sister; Diabetes in her half-brother; Heart Problems in her father; Lung cancer in her paternal grandmother; Ovarian cancer in her maternal grandmother; Stroke in her father.  SOCIAL HISTORY:  reports that she has never smoked. She has never used smokeless tobacco. She reports previous alcohol use. She reports that she does not use drugs.  ALLERGIES: Emend [fosaprepitant]  MEDICATIONS:  Current Outpatient Medications  Medication Sig Dispense Refill   acetaminophen (TYLENOL) 500 MG tablet Take 1-2 tablets (500-1,000 mg total) by mouth every 6 (six) hours as needed. (Patient not taking: No sig reported) 60 tablet 0   Biotin w/ Vitamins C & E (HAIR/SKIN/NAILS PO) Take 1 tablet by mouth daily.     capecitabine (XELODA) 500 MG tablet TAKE 3 TABLETS (1500 MG) BY MOUTH 2 TIMES DAILY, START ON 10/04/2020 AND CONTINUE FOR 14 DAYS  THEN OFF A WEEK, THEN REPEAT 84 tablet 4   Cholecalciferol (VITAMIN D3) 250 MCG (10000 UT) capsule Take 10,000 Units by mouth daily.     ferrous gluconate (FERGON) 324 MG tablet Take 1 tablet (324 mg total) by mouth daily with breakfast. 100 tablet 3   Misc Natural Products (PHYTOCILLIN) CAPS Take 2 capsules by mouth daily.     OVER THE COUNTER MEDICATION Take 10 mLs by mouth 2 (two) times daily. floradix otc supplement     OVER THE COUNTER MEDICATION Take 2 capsules by mouth daily. kyolic aged garlic otc supplement     No current facility-administered medications for this encounter.    REVIEW OF SYSTEMS:  As above.   PHYSICAL EXAM:  vitals were not taken for this visit.   General: Alert and oriented, in no acute distress  LABORATORY DATA:  Lab Results  Component Value Date   WBC 8.4 01/28/2021   HGB 12.2 01/28/2021   HCT 34.6 (L) 01/28/2021   MCV 76.4 (L) 01/28/2021   PLT 295 01/28/2021   CMP     Component Value Date/Time   NA 136 01/28/2021 1240   K 3.3 (L) 01/28/2021 1240   CL 95 (L) 01/28/2021 1240   CO2 25 01/28/2021 1240   GLUCOSE 87 01/28/2021 1240   BUN 14 01/28/2021 1240   CREATININE 0.75 01/28/2021 1240   CREATININE 0.72 12/17/2020 1149   CALCIUM 8.1 (L) 01/28/2021 1240   PROT 7.1 01/28/2021  1240   ALBUMIN 2.6 (L) 01/28/2021 1240   AST 215 (H) 01/28/2021 1240   AST 104 (H) 12/17/2020 1149   ALT 70 (H) 01/28/2021 1240   ALT 53 (H) 12/17/2020 1149   ALKPHOS 652 (H) 01/28/2021 1240   BILITOT 4.4 (H) 01/28/2021 1240   BILITOT 1.0 12/17/2020 1149   GFRNONAA >60 01/28/2021 1240   GFRNONAA >60 12/17/2020 1149   GFRAA >60 03/27/2020 1008   GFRAA >60 10/17/2019 1529       RADIOGRAPHY: MR Brain W Wo Contrast  Result Date: 01/16/2021 CLINICAL DATA:  Breast cancer staging.  Confusion. EXAM: MRI HEAD WITHOUT AND WITH CONTRAST TECHNIQUE: Multiplanar, multiecho pulse sequences of the brain and surrounding structures were obtained without and with intravenous contrast.  CONTRAST:  70m GADAVIST GADOBUTROL 1 MMOL/ML IV SOLN COMPARISON:  None. FINDINGS: Brain: Peripherally enhancing 5 mm lesion in the posterior left cerebellar hemisphere (series 16, image 26; series 18, image 17), concerning for a metastasis. Additional punctate focus of enhancement within the right cerebellum (series 16, image 33 and series 18, image 10) may represent additional tiny metastasis. No evidence of acute infarct, hydrocephalus, acute hemorrhage, extra-axial fluid collection, midline shift. Basal cisterns are patent. Vascular: Major arterial flow voids are maintained at the skull base. Skull and upper cervical spine: Multifocal T2 hyperintense, T1 hypointense, and enhancing marrow replacing lesions involving the calvarium and skull base, compatible with osseous metastatic disease. A 7 mm lesion at the high right vertex obliterates the inner calvarial table and may see extend intracranially to involve the dura (series 16, image 130 and series 17, image 14) Sinuses/Orbits: Clear sinuses. Unremarkable orbits Other: No mastoid effusions. IMPRESSION: 1. Peripherally enhancing 5 mm lesion in the posterior left cerebellar hemisphere, concerning for a metastasis given the patient's known metastatic breast cancer. 2. Additional punctate focus of enhancement within the right cerebellum may represent an additional tiny metastasis. 3. Skull base and calvarial osseous metastatic disease. A 7 mm lesion at the high right vertex obliterates the inner calvarial table and may see extend intracranially to involve the dura. No adjacent brain edema or mass effect. Electronically Signed   By: FMargaretha SheffieldMD   On: 01/16/2021 15:39   [place]BIOPSY (LIVER)  Result Date: 01/28/2021 INDICATION: 45year old female with history of metastatic breast cancer, multiple liver lesions. EXAM: ULTRASOUND GUIDED LIVER LESION BIOPSY COMPARISON:  12/27/2020 MEDICATIONS: None ANESTHESIA/SEDATION: Fentanyl 100 mcg IV; Versed 2 mg IV Total  Moderate Sedation time:  10 minutes. The patient's level of consciousness and vital signs were monitored continuously by radiology nursing throughout the procedure under my direct supervision. COMPLICATIONS: None immediate. PROCEDURE: Informed written consent was obtained from the patient after a discussion of the risks, benefits and alternatives to treatment. The patient understands and consents the procedure. A timeout was performed prior to the initiation of the procedure. Ultrasound scanning was performed of the right upper abdominal quadrant demonstrates innumerable hyperechoic masses throughout the liver. A subcapsular mass in the left lobe was selected for biopsy and the procedure was planned. The right upper abdominal quadrant was prepped and draped in the usual sterile fashion. The overlying soft tissues were anesthetized with 1% lidocaine with epinephrine. A 17 gauge, 6.8 cm co-axial needle was advanced into a peripheral aspect of the lesion. This was followed by total of 3 core biopsies with an 18 gauge core device under direct ultrasound guidance. The coaxial needle tract was embolized with a small amount of Gel-Foam slurry and superficial hemostasis was obtained with  manual compression. Post procedural scanning was negative for definitive area of hemorrhage or additional complication. A dressing was placed. The patient tolerated the procedure well without immediate post procedural complication. IMPRESSION: Technically successful ultrasound guided core needle biopsy of left lobe liver mass. Ruthann Cancer, MD [facility] Radiology Electronically Signed   By: Ruthann Cancer MD   On: 01/28/2021 14:32      IMPRESSION/PLAN: This is a very pleasant 45 year old woman that I know well, unfortunately with progressive metastatic breast cancer to bones, viscera, and now  with metastatic disease to the brain.  I had a lengthy discussion with the patient after  reviewing their MRI results with them.   While she is a candidates for radiosurgery to the brain, she is intent on only pursuing procedures that will palliate her current symptoms.  I think that SRS to the brain will not improve her overall prognosis, especially given the lack of systemic control of her disease. Therefore, she will defer brain treatment at this time.  After lengthy discussion, the patient would like to proceed with palliative RT to the sacral disease causing her pain, ambulation issues, and urinary issues.   CT simulation will take place soon (staff will contact patient with appt time) and treatment hopefully next week.  I plan to deliver 20 Gy in 5 fractions to the symptomatic spinal disease. Will consent her  in person when she comes in for planning. She opted for a virtual reconsultation today, hence no consent session or physical exam.    I urged her to come in to see Dr Mickeal Skinner on 8-11 as scheduled for an in person exam and evaluation.   The above plan was personally communicated to the multidisciplinary team.  I have personally reviewed her images.  This encounter was provided by telemedicine platform; patient desired telemedicine during pandemic precautions.  myChart video was not available; telephone was used. The patient has given verbal consent for this type of encounter and has been advised to only accept a meeting of this type in a secure network environment. On date of service, in total, I spent 40 minutes on this encounter.   The attendants for this meeting include Krista [name]  and Krista [name] During the encounter, Krista [name] was located at [facility] Radiation Oncology Department.  Krista [name] was located at home.   __________________________________________   Krista Gibson, MD  This document serves as a record of services personally performed by Krista Gibson, MD. It was created on her behalf by Roney Mans, a trained  medical scribe. The creation of this record is based on the scribe's personal observations and the provider's statements to them. This document has been checked and approved by the attending provider.

## 2021-02-06 ENCOUNTER — Inpatient Hospital Stay: Admission: RE | Admit: 2021-02-06 | Payer: BLUE CROSS/BLUE SHIELD | Source: Ambulatory Visit

## 2021-02-07 ENCOUNTER — Inpatient Hospital Stay: Payer: BLUE CROSS/BLUE SHIELD

## 2021-02-07 ENCOUNTER — Encounter: Payer: Self-pay | Admitting: Radiation Oncology

## 2021-02-07 ENCOUNTER — Inpatient Hospital Stay: Payer: BLUE CROSS/BLUE SHIELD | Admitting: Internal Medicine

## 2021-02-07 ENCOUNTER — Inpatient Hospital Stay: Payer: BLUE CROSS/BLUE SHIELD | Admitting: Oncology

## 2021-02-08 ENCOUNTER — Telehealth: Payer: Self-pay | Admitting: Radiation Therapy

## 2021-02-08 ENCOUNTER — Ambulatory Visit: Payer: BLUE CROSS/BLUE SHIELD | Admitting: Radiation Oncology

## 2021-02-08 DIAGNOSIS — C50211 Malignant neoplasm of upper-inner quadrant of right female breast: Secondary | ICD-10-CM | POA: Insufficient documentation

## 2021-02-08 DIAGNOSIS — C7951 Secondary malignant neoplasm of bone: Secondary | ICD-10-CM | POA: Insufficient documentation

## 2021-02-08 DIAGNOSIS — Z510 Encounter for antineoplastic radiation therapy: Secondary | ICD-10-CM | POA: Insufficient documentation

## 2021-02-08 DIAGNOSIS — Z170 Estrogen receptor positive status [ER+]: Secondary | ICD-10-CM | POA: Insufficient documentation

## 2021-02-08 DIAGNOSIS — C7931 Secondary malignant neoplasm of brain: Secondary | ICD-10-CM | POA: Insufficient documentation

## 2021-02-08 NOTE — Telephone Encounter (Addendum)
Several attempts made by myself and the [facility] staff to reach patient and set up simulation for radiation treatment planning. The patient's voicemail is full and messages have been left on her husband's cell. I have also sent out an e-mail to the G-mail account listed in her EMR.  A SIM time has been held for today at 2:30 and instructions left for the patient to arrive at 2:00 in order to sign consent with Dr. Isidore Moos beforehand.    Mont Dutton R.T.(R)(T) Radiation Special Procedures Navigator

## 2021-02-12 ENCOUNTER — Ambulatory Visit: Payer: BLUE CROSS/BLUE SHIELD | Admitting: Radiation Oncology

## 2021-02-13 ENCOUNTER — Telehealth: Payer: Self-pay | Admitting: *Deleted

## 2021-02-13 NOTE — Telephone Encounter (Signed)
VM left by the patient stating she would like her MRI to be faxed to her chiropractor " Dr Drue Second at [facility] "  " Fax number is [phone] but you have to call them first to let them know you are sending it "  This RN could not locate a release of information signed by the patient.  This RN called the patient and obtained her identified VM- message left informing pt of need for ROI to send fax - but noted she is My  Chart active and that the MRI is available from this portal for her to print just as I could print and fax.  This RN asked pt to call back if she has further needs or other request.

## 2021-02-20 ENCOUNTER — Ambulatory Visit
Admission: RE | Admit: 2021-02-20 | Discharge: 2021-02-20 | Disposition: A | Discharge status: Home / Self Care | Payer: BLUE CROSS/BLUE SHIELD | Source: Ambulatory Visit | Attending: Radiation Oncology | Admitting: Radiation Oncology

## 2021-02-20 ENCOUNTER — Other Ambulatory Visit: Payer: Self-pay

## 2021-02-20 DIAGNOSIS — Z170 Estrogen receptor positive status [ER+]: Secondary | ICD-10-CM | POA: Diagnosis not present

## 2021-02-20 DIAGNOSIS — C7951 Secondary malignant neoplasm of bone: Secondary | ICD-10-CM | POA: Diagnosis present

## 2021-02-20 DIAGNOSIS — C50211 Malignant neoplasm of upper-inner quadrant of right female breast: Secondary | ICD-10-CM | POA: Diagnosis not present

## 2021-02-20 DIAGNOSIS — C7931 Secondary malignant neoplasm of brain: Secondary | ICD-10-CM | POA: Diagnosis not present

## 2021-02-20 DIAGNOSIS — Z510 Encounter for antineoplastic radiation therapy: Secondary | ICD-10-CM | POA: Diagnosis not present

## 2021-02-20 LAB — PREGNANCY, URINE: Preg Test, Ur: NEGATIVE

## 2021-02-22 ENCOUNTER — Other Ambulatory Visit: Payer: Self-pay | Admitting: Radiation Oncology

## 2021-02-22 DIAGNOSIS — C7951 Secondary malignant neoplasm of bone: Secondary | ICD-10-CM

## 2021-02-22 MED ORDER — OXYCODONE HCL 5 MG PO TABS: 5.0000 mg | ORAL_TABLET | ORAL | 0 refills | Status: AC | PRN

## 2021-02-25 ENCOUNTER — Other Ambulatory Visit: Payer: Self-pay

## 2021-02-25 ENCOUNTER — Ambulatory Visit
Admission: RE | Admit: 2021-02-25 | Discharge: 2021-02-25 | Disposition: A | Discharge status: Home / Self Care | Payer: BLUE CROSS/BLUE SHIELD | Source: Ambulatory Visit | Attending: Radiation Oncology | Admitting: Radiation Oncology

## 2021-02-25 DIAGNOSIS — C7951 Secondary malignant neoplasm of bone: Secondary | ICD-10-CM | POA: Diagnosis not present

## 2021-02-26 ENCOUNTER — Ambulatory Visit
Admission: RE | Admit: 2021-02-26 | Discharge: 2021-02-26 | Disposition: A | Discharge status: Home / Self Care | Payer: BLUE CROSS/BLUE SHIELD | Source: Ambulatory Visit | Attending: Radiation Oncology | Admitting: Radiation Oncology

## 2021-02-26 DIAGNOSIS — C7951 Secondary malignant neoplasm of bone: Secondary | ICD-10-CM | POA: Diagnosis not present

## 2021-02-27 ENCOUNTER — Other Ambulatory Visit: Payer: Self-pay

## 2021-02-27 ENCOUNTER — Ambulatory Visit
Admission: RE | Admit: 2021-02-27 | Discharge: 2021-02-27 | Disposition: A | Discharge status: Home / Self Care | Payer: BLUE CROSS/BLUE SHIELD | Source: Ambulatory Visit | Attending: Radiation Oncology | Admitting: Radiation Oncology

## 2021-02-27 DIAGNOSIS — C7951 Secondary malignant neoplasm of bone: Secondary | ICD-10-CM | POA: Diagnosis not present

## 2021-02-28 ENCOUNTER — Ambulatory Visit
Admission: RE | Admit: 2021-02-28 | Discharge: 2021-02-28 | Disposition: A | Discharge status: Home / Self Care | Payer: BLUE CROSS/BLUE SHIELD | Source: Ambulatory Visit | Attending: Radiation Oncology | Admitting: Radiation Oncology

## 2021-02-28 DIAGNOSIS — C7951 Secondary malignant neoplasm of bone: Secondary | ICD-10-CM | POA: Insufficient documentation

## 2021-02-28 DIAGNOSIS — C7931 Secondary malignant neoplasm of brain: Secondary | ICD-10-CM | POA: Diagnosis not present

## 2021-02-28 DIAGNOSIS — Z510 Encounter for antineoplastic radiation therapy: Secondary | ICD-10-CM | POA: Diagnosis not present

## 2021-02-28 DIAGNOSIS — Z170 Estrogen receptor positive status [ER+]: Secondary | ICD-10-CM | POA: Diagnosis not present

## 2021-02-28 DIAGNOSIS — C50211 Malignant neoplasm of upper-inner quadrant of right female breast: Secondary | ICD-10-CM | POA: Diagnosis not present

## 2021-03-01 ENCOUNTER — Encounter: Payer: Self-pay | Admitting: Radiation Oncology

## 2021-03-01 ENCOUNTER — Ambulatory Visit
Admission: RE | Admit: 2021-03-01 | Discharge: 2021-03-01 | Disposition: A | Discharge status: Home / Self Care | Payer: BLUE CROSS/BLUE SHIELD | Source: Ambulatory Visit | Attending: Radiation Oncology | Admitting: Radiation Oncology

## 2021-03-01 ENCOUNTER — Other Ambulatory Visit: Payer: Self-pay

## 2021-03-01 DIAGNOSIS — C7951 Secondary malignant neoplasm of bone: Secondary | ICD-10-CM | POA: Diagnosis not present

## 2021-03-06 ENCOUNTER — Emergency Department (HOSPITAL_COMMUNITY): Payer: BLUE CROSS/BLUE SHIELD

## 2021-03-06 ENCOUNTER — Inpatient Hospital Stay (HOSPITAL_COMMUNITY)
Admission: EM | Admit: 2021-03-06 | Discharge: 2021-03-30 | DRG: 054 | Disposition: E | Payer: BLUE CROSS/BLUE SHIELD | Attending: Internal Medicine | Admitting: Internal Medicine

## 2021-03-06 ENCOUNTER — Observation Stay (HOSPITAL_COMMUNITY): Payer: BLUE CROSS/BLUE SHIELD

## 2021-03-06 ENCOUNTER — Other Ambulatory Visit: Payer: Self-pay

## 2021-03-06 DIAGNOSIS — I959 Hypotension, unspecified: Secondary | ICD-10-CM | POA: Diagnosis not present

## 2021-03-06 DIAGNOSIS — C7951 Secondary malignant neoplasm of bone: Secondary | ICD-10-CM | POA: Diagnosis present

## 2021-03-06 DIAGNOSIS — N179 Acute kidney failure, unspecified: Secondary | ICD-10-CM | POA: Diagnosis present

## 2021-03-06 DIAGNOSIS — R918 Other nonspecific abnormal finding of lung field: Secondary | ICD-10-CM | POA: Diagnosis present

## 2021-03-06 DIAGNOSIS — C7931 Secondary malignant neoplasm of brain: Secondary | ICD-10-CM | POA: Diagnosis not present

## 2021-03-06 DIAGNOSIS — Z20822 Contact with and (suspected) exposure to covid-19: Secondary | ICD-10-CM | POA: Diagnosis present

## 2021-03-06 DIAGNOSIS — M4852XA Collapsed vertebra, not elsewhere classified, cervical region, initial encounter for fracture: Secondary | ICD-10-CM | POA: Diagnosis present

## 2021-03-06 DIAGNOSIS — D696 Thrombocytopenia, unspecified: Secondary | ICD-10-CM | POA: Diagnosis present

## 2021-03-06 DIAGNOSIS — D649 Anemia, unspecified: Secondary | ICD-10-CM | POA: Diagnosis present

## 2021-03-06 DIAGNOSIS — E875 Hyperkalemia: Secondary | ICD-10-CM | POA: Diagnosis present

## 2021-03-06 DIAGNOSIS — G40901 Epilepsy, unspecified, not intractable, with status epilepticus: Secondary | ICD-10-CM | POA: Diagnosis present

## 2021-03-06 DIAGNOSIS — E876 Hypokalemia: Secondary | ICD-10-CM | POA: Diagnosis present

## 2021-03-06 DIAGNOSIS — Z888 Allergy status to other drugs, medicaments and biological substances status: Secondary | ICD-10-CM

## 2021-03-06 DIAGNOSIS — E872 Acidosis: Secondary | ICD-10-CM | POA: Diagnosis not present

## 2021-03-06 DIAGNOSIS — M5430 Sciatica, unspecified side: Secondary | ICD-10-CM | POA: Diagnosis present

## 2021-03-06 DIAGNOSIS — E871 Hypo-osmolality and hyponatremia: Secondary | ICD-10-CM | POA: Diagnosis present

## 2021-03-06 DIAGNOSIS — C787 Secondary malignant neoplasm of liver and intrahepatic bile duct: Secondary | ICD-10-CM | POA: Diagnosis not present

## 2021-03-06 DIAGNOSIS — Z171 Estrogen receptor negative status [ER-]: Secondary | ICD-10-CM

## 2021-03-06 DIAGNOSIS — Z515 Encounter for palliative care: Secondary | ICD-10-CM

## 2021-03-06 DIAGNOSIS — G9341 Metabolic encephalopathy: Secondary | ICD-10-CM | POA: Diagnosis present

## 2021-03-06 DIAGNOSIS — Z66 Do not resuscitate: Secondary | ICD-10-CM | POA: Diagnosis not present

## 2021-03-06 DIAGNOSIS — C50211 Malignant neoplasm of upper-inner quadrant of right female breast: Secondary | ICD-10-CM | POA: Diagnosis present

## 2021-03-06 DIAGNOSIS — M4854XA Collapsed vertebra, not elsewhere classified, thoracic region, initial encounter for fracture: Secondary | ICD-10-CM | POA: Diagnosis present

## 2021-03-06 DIAGNOSIS — R188 Other ascites: Secondary | ICD-10-CM | POA: Diagnosis not present

## 2021-03-06 DIAGNOSIS — Z1501 Genetic susceptibility to malignant neoplasm of breast: Secondary | ICD-10-CM

## 2021-03-06 DIAGNOSIS — R7989 Other specified abnormal findings of blood chemistry: Secondary | ICD-10-CM

## 2021-03-06 DIAGNOSIS — K7290 Hepatic failure, unspecified without coma: Secondary | ICD-10-CM | POA: Diagnosis present

## 2021-03-06 DIAGNOSIS — Z803 Family history of malignant neoplasm of breast: Secondary | ICD-10-CM

## 2021-03-06 DIAGNOSIS — R569 Unspecified convulsions: Secondary | ICD-10-CM | POA: Diagnosis not present

## 2021-03-06 DIAGNOSIS — G8929 Other chronic pain: Secondary | ICD-10-CM | POA: Diagnosis present

## 2021-03-06 DIAGNOSIS — D72829 Elevated white blood cell count, unspecified: Secondary | ICD-10-CM | POA: Diagnosis present

## 2021-03-06 LAB — PROTIME-INR
INR: 1.3 — ABNORMAL HIGH (ref 0.8–1.2)
Prothrombin Time: 16.6 seconds — ABNORMAL HIGH (ref 11.4–15.2)

## 2021-03-06 LAB — BASIC METABOLIC PANEL
Anion gap: 19 — ABNORMAL HIGH (ref 5–15)
BUN: 82 mg/dL — ABNORMAL HIGH (ref 6–20)
CO2: 17 mmol/L — ABNORMAL LOW (ref 22–32)
Calcium: 7.2 mg/dL — ABNORMAL LOW (ref 8.9–10.3)
Chloride: 92 mmol/L — ABNORMAL LOW (ref 98–111)
Creatinine, Ser: 2.87 mg/dL — ABNORMAL HIGH (ref 0.44–1.00)
GFR, Estimated: 20 mL/min — ABNORMAL LOW (ref >60)
Glucose, Bld: 101 mg/dL — ABNORMAL HIGH (ref 70–99)
Potassium: 3.1 mmol/L — ABNORMAL LOW (ref 3.5–5.1)
Sodium: 128 mmol/L — ABNORMAL LOW (ref 135–145)

## 2021-03-06 LAB — CBC WITH DIFFERENTIAL/PLATELET
Abs Immature Granulocytes: 0.66 10*3/uL — ABNORMAL HIGH (ref 0.00–0.07)
Basophils Absolute: 0 10*3/uL (ref 0.0–0.1)
Basophils Relative: 0 %
Eosinophils Absolute: 0 10*3/uL (ref 0.0–0.5)
Eosinophils Relative: 0 %
HCT: 26.8 % — ABNORMAL LOW (ref 36.0–46.0)
Hemoglobin: 9.4 g/dL — ABNORMAL LOW (ref 12.0–15.0)
Immature Granulocytes: 6 %
Lymphocytes Relative: 3 %
Lymphs Abs: 0.3 10*3/uL — ABNORMAL LOW (ref 0.7–4.0)
MCH: 29.4 pg (ref 26.0–34.0)
MCHC: 35.1 g/dL (ref 30.0–36.0)
MCV: 83.8 fL (ref 80.0–100.0)
Monocytes Absolute: 1.1 10*3/uL — ABNORMAL HIGH (ref 0.1–1.0)
Monocytes Relative: 10 %
Neutro Abs: 8.9 10*3/uL — ABNORMAL HIGH (ref 1.7–7.7)
Neutrophils Relative %: 81 %
Platelets: 90 10*3/uL — ABNORMAL LOW (ref 150–400)
RBC: 3.2 MIL/uL — ABNORMAL LOW (ref 3.87–5.11)
RDW: 20.4 % — ABNORMAL HIGH (ref 11.5–15.5)
WBC: 11 10*3/uL — ABNORMAL HIGH (ref 4.0–10.5)
nRBC: 11.7 % — ABNORMAL HIGH (ref 0.0–0.2)

## 2021-03-06 LAB — COMPREHENSIVE METABOLIC PANEL
ALT: 63 U/L — ABNORMAL HIGH (ref 0–44)
ALT: 25 U/L (ref 0–44)
AST: 260 U/L — ABNORMAL HIGH (ref 15–41)
AST: 21 U/L (ref 15–41)
Albumin: 1.1 g/dL — ABNORMAL LOW (ref 3.5–5.0)
Albumin: 4 g/dL (ref 3.5–5.0)
Alkaline Phosphatase: 2031 U/L — ABNORMAL HIGH (ref 38–126)
Alkaline Phosphatase: 62 U/L (ref 38–126)
Anion gap: 21 — ABNORMAL HIGH (ref 5–15)
Anion gap: 5 (ref 5–15)
BUN: 79 mg/dL — ABNORMAL HIGH (ref 6–20)
BUN: 12 mg/dL (ref 6–20)
CO2: 17 mmol/L — ABNORMAL LOW (ref 22–32)
CO2: 27 mmol/L (ref 22–32)
Calcium: 7.2 mg/dL — ABNORMAL LOW (ref 8.9–10.3)
Calcium: 9.3 mg/dL (ref 8.9–10.3)
Chloride: 89 mmol/L — ABNORMAL LOW (ref 98–111)
Chloride: 105 mmol/L (ref 98–111)
Creatinine, Ser: 2.98 mg/dL — ABNORMAL HIGH (ref 0.44–1.00)
Creatinine, Ser: 0.6 mg/dL (ref 0.44–1.00)
GFR, Estimated: 19 mL/min — ABNORMAL LOW (ref >60)
GFR, Estimated: >60 mL/min (ref >60)
Glucose, Bld: 122 mg/dL — ABNORMAL HIGH (ref 70–99)
Glucose, Bld: 138 mg/dL — ABNORMAL HIGH (ref 70–99)
Potassium: 2.8 mmol/L — ABNORMAL LOW (ref 3.5–5.1)
Potassium: 4 mmol/L (ref 3.5–5.1)
Sodium: 127 mmol/L — ABNORMAL LOW (ref 135–145)
Sodium: 137 mmol/L (ref 135–145)
Total Bilirubin: 27.1 mg/dL (ref 0.3–1.2)
Total Bilirubin: 0.5 mg/dL (ref 0.3–1.2)
Total Protein: 4.7 g/dL — ABNORMAL LOW (ref 6.5–8.1)
Total Protein: 7.5 g/dL (ref 6.5–8.1)

## 2021-03-06 LAB — CBC
HCT: 27.5 % — ABNORMAL LOW (ref 36.0–46.0)
Hemoglobin: 9.5 g/dL — ABNORMAL LOW (ref 12.0–15.0)
MCH: 28.7 pg (ref 26.0–34.0)
MCHC: 34.5 g/dL (ref 30.0–36.0)
MCV: 83.1 fL (ref 80.0–100.0)
Platelets: 94 10*3/uL — ABNORMAL LOW (ref 150–400)
RBC: 3.31 MIL/uL — ABNORMAL LOW (ref 3.87–5.11)
RDW: 20.3 % — ABNORMAL HIGH (ref 11.5–15.5)
WBC: 11.1 10*3/uL — ABNORMAL HIGH (ref 4.0–10.5)
nRBC: 12.2 % — ABNORMAL HIGH (ref 0.0–0.2)

## 2021-03-06 LAB — PHOSPHORUS: Phosphorus: 6.3 mg/dL — ABNORMAL HIGH (ref 2.5–4.6)

## 2021-03-06 LAB — AMMONIA: Ammonia: 70 umol/L — ABNORMAL HIGH (ref 9–35)

## 2021-03-06 LAB — SARS CORONAVIRUS 2 (TAT 6-24 HRS): SARS Coronavirus 2: NEGATIVE

## 2021-03-06 LAB — MAGNESIUM
Magnesium: 2.2 mg/dL (ref 1.7–2.4)
Magnesium: 2.4 mg/dL (ref 1.7–2.4)

## 2021-03-06 LAB — I-STAT BETA HCG BLOOD, ED (MC, WL, AP ONLY): I-stat hCG, quantitative: 5.3 m[IU]/mL — ABNORMAL HIGH (ref <5)

## 2021-03-06 LAB — ETHANOL: Alcohol, Ethyl (B): <10 mg/dL (ref <10)

## 2021-03-06 LAB — CBG MONITORING, ED: Glucose-Capillary: 112 mg/dL — ABNORMAL HIGH (ref 70–99)

## 2021-03-06 MED ORDER — POTASSIUM CHLORIDE 10 MEQ/100ML IV SOLN
10.0000 meq | Freq: Once | INTRAVENOUS | Status: AC
  Administered 2021-03-06: 10 meq via INTRAVENOUS
  Filled 2021-03-06: qty 100

## 2021-03-06 MED ORDER — HYDROMORPHONE HCL 1 MG/ML IJ SOLN
1.0000 mg | INTRAMUSCULAR | Status: DC | PRN
Start: 2021-03-06 — End: 2021-03-07

## 2021-03-06 MED ORDER — ONDANSETRON HCL 4 MG PO TABS: 4.0000 mg | ORAL_TABLET | Freq: Four times a day (QID) | ORAL | Status: DC | PRN

## 2021-03-06 MED ORDER — LEVETIRACETAM IN NACL 1500 MG/100ML IV SOLN
1500.0000 mg | Freq: Once | INTRAVENOUS | Status: AC
  Administered 2021-03-06: 1500 mg via INTRAVENOUS
  Filled 2021-03-06: qty 100

## 2021-03-06 MED ORDER — POTASSIUM CHLORIDE 10 MEQ/100ML IV SOLN
10.0000 meq | INTRAVENOUS | Status: DC
Start: 2021-03-06 — End: 2021-03-06
  Administered 2021-03-06: 10 meq via INTRAVENOUS
  Filled 2021-03-06: qty 100

## 2021-03-06 MED ORDER — LACTULOSE ENEMA
300.0000 mL | ORAL | Status: AC
  Administered 2021-03-06 – 2021-03-07 (×3): 300 mL via RECTAL
  Filled 2021-03-06 (×3): qty 300

## 2021-03-06 MED ORDER — CALCIUM GLUCONATE-NACL 1-0.675 GM/50ML-% IV SOLN
1.0000 g | Freq: Once | INTRAVENOUS | Status: AC
  Administered 2021-03-06: 1000 mg via INTRAVENOUS
  Filled 2021-03-06: qty 50

## 2021-03-06 MED ORDER — HYDROMORPHONE HCL 1 MG/ML IJ SOLN
1.0000 mg | INTRAMUSCULAR | Status: DC | PRN
  Administered 2021-03-06: 1 mg via INTRAVENOUS
  Filled 2021-03-06 (×2): qty 1

## 2021-03-06 MED ORDER — SODIUM CHLORIDE 0.9% FLUSH
10.0000 mL | Freq: Two times a day (BID) | INTRAVENOUS | Status: DC
  Administered 2021-03-06: 10 mL

## 2021-03-06 MED ORDER — MIDAZOLAM HCL 2 MG/2ML IJ SOLN
INTRAMUSCULAR | Status: AC
  Filled 2021-03-06: qty 2

## 2021-03-06 MED ORDER — SODIUM CHLORIDE 0.9 % IV BOLUS
1000.0000 mL | Freq: Once | INTRAVENOUS | Status: AC
  Administered 2021-03-06: 1000 mL via INTRAVENOUS

## 2021-03-06 MED ORDER — FENTANYL CITRATE PF 50 MCG/ML IJ SOSY
50.0000 ug | PREFILLED_SYRINGE | Freq: Once | INTRAMUSCULAR | Status: AC
  Administered 2021-03-06: 50 ug via INTRAVENOUS
  Filled 2021-03-06: qty 1

## 2021-03-06 MED ORDER — SODIUM CHLORIDE 0.9% FLUSH: 10.0000 mL | INTRAVENOUS | Status: DC | PRN

## 2021-03-06 MED ORDER — OXYCODONE HCL 5 MG PO TABS: 5.0000 mg | ORAL_TABLET | ORAL | Status: DC | PRN

## 2021-03-06 MED ORDER — LACTATED RINGERS IV SOLN: INTRAVENOUS | Status: DC

## 2021-03-06 MED ORDER — SODIUM CHLORIDE 0.9 % IV SOLN: INTRAVENOUS | Status: DC

## 2021-03-06 MED ORDER — POTASSIUM CHLORIDE 10 MEQ/100ML IV SOLN
10.0000 meq | INTRAVENOUS | Status: AC
Start: 2021-03-06 — End: 2021-03-06
  Administered 2021-03-06 (×2): 10 meq via INTRAVENOUS
  Filled 2021-03-06 (×2): qty 100

## 2021-03-06 MED ORDER — ENOXAPARIN SODIUM 30 MG/0.3ML IJ SOSY
30.0000 mg | PREFILLED_SYRINGE | INTRAMUSCULAR | Status: DC
  Filled 2021-03-06: qty 0.3

## 2021-03-06 MED ORDER — ONDANSETRON HCL 4 MG/2ML IJ SOLN: 4.0000 mg | Freq: Four times a day (QID) | INTRAMUSCULAR | Status: DC | PRN

## 2021-03-06 MED ORDER — LEVETIRACETAM IN NACL 500 MG/100ML IV SOLN
500.0000 mg | Freq: Two times a day (BID) | INTRAVENOUS | Status: DC
  Administered 2021-03-06: 500 mg via INTRAVENOUS
  Filled 2021-03-06 (×3): qty 100

## 2021-03-06 NOTE — ED Notes (Signed)
EEG tech called out requesting something for pt as she was unable to complete EEG with pt trying to get out of bed. 50 mcg Fentanyl given per order

## 2021-03-06 NOTE — Progress Notes (Signed)
EEG done at bedside. Patient given '50mg'$  fentanyl during test due to restless. Results pending.

## 2021-03-06 NOTE — ED Provider Notes (Addendum)
Krista [name]   CSN: [id number] Arrival date & time: 03/15/2021  0814     History Chief Complaint  Patient presents with   Seizures    Krista [name] is a 45 y.o. female history of metastatic cancer, anemia, hemorrhoids.  Patient presents today after experiencing a seizure at home.  63 of history obtained from patient's husband at bedside as patient is currently postictal.  Patient's husband reports patient had a 5-minute episode of generalized seizure while lying in bed.  No fall from the bed, no other symptoms to report.  No history of seizures.  Patient has known brain metastases.  No antiepileptic use.  Per nursing staff, EMS found patient postictal, no medications administered prior to arrival.  EMS not at bedside for report.  Vital signs notable for tachycardia otherwise stable.  CBG 102.  HPI     Past Medical History:  Diagnosis Date   Anemia    history of   Cancer ([place])    right breast cancer 10/03/19, received chemotherapy   Family history of breast cancer    Family history of cervical cancer    Family history of lung cancer    Hemorrhoids     Patient Active Problem List   Diagnosis Date Noted   Hypercalcemia of malignancy 01/10/2021   Bone metastases ([place]) 01/08/2021   Liver metastases ([place]) 01/08/2021   Antineoplastic chemotherapy induced anemia 02/14/2020   Breast cancer, BRCA1 positive, right ([place]) 11/29/2019   Port-A-Cath in place 11/29/2019   Encounter for antineoplastic chemotherapy 11/25/2019   BRCA1 gene mutation positive 11/25/2019   Goals of care, counseling/discussion 10/24/2019   Family history of breast cancer    Family history of lung cancer    Family history of cervical cancer    Malignant neoplasm of upper-inner quadrant of right breast in female, estrogen receptor negative ([place]) 10/17/2019   Thalassemia trait 10/17/2019    Past Surgical History:  Procedure Laterality Date    AXILLARY LYMPH NODE DISSECTION Right 05/10/2020   Procedure: RIGHT AXILLARY LYMPH NODE DISSECTION;  Surgeon: Erroll Luna, MD;  Location: [facility];  Service: General;  Laterality: Right;   BREAST LUMPECTOMY WITH RADIOACTIVE SEED AND SENTINEL LYMPH NODE BIOPSY Right 04/05/2020   Procedure: RIGHT BREAST BRACKETED LUMPECTOMY WITH RADIOACTIVE SEED AND RIGHT AXILLARY  TARGETED LYMPH NODE BIOPSY AND SENTINEL LYMPH NODE [place];  Surgeon: Erroll Luna, MD;  Location: [facility];  Service: General;  Laterality: Right;  PEC BLOCK   IR IMAGING GUIDED PORT INSERTION  11/07/2019   PORT-A-CATH REMOVAL Left 04/05/2020   Procedure: REMOVAL PORT-A-CATH;  Surgeon: Erroll Luna, MD;  Location: [facility];  Service: General;  Laterality: Left;     OB History   No obstetric history on file.     Family History  Problem Relation Age of Onset   Breast cancer Mother 57       double mastectomy   Breast cancer Half-Sister 53   Cirrhosis Half-Sister    Heart Problems Father    Stroke Father    Breast cancer Maternal Aunt 64   Cancer Maternal Uncle 65       unknown type   Ovarian cancer Maternal Grandmother        dx. in her mid-40s   Lung cancer Paternal Grandmother    Cancer Other        unknown type; maternal great-uncles/aunts (x3)   Diabetes Half-Brother    Cancer Other  unknown type; matenral great-uncles/aunt (x4)   Cancer Maternal Great-grandfather        unknown type   Breast cancer Cousin 62       paternal cousin    Social History   Tobacco Use   Smoking status: Never   Smokeless tobacco: Never  Vaping Use   Vaping Use: Never used  Substance Use Topics   Alcohol use: Not Currently   Drug use: Never    Home Medications Prior to Admission medications   Medication Sig Start Date End Date Taking? Authorizing Provider  acetaminophen (TYLENOL) 500 MG tablet Take 1-2 tablets (500-1,000 mg total) by mouth every 6 (six) hours as needed. Patient not taking: No sig  reported 11/07/19   Magrinat, [name], MD  Biotin w/ Vitamins C & E (HAIR/SKIN/NAILS PO) Take 1 tablet by mouth daily.    [provider]  capecitabine (XELODA) 500 MG tablet TAKE 3 TABLETS (1500 MG) BY MOUTH 2 TIMES DAILY, START ON 10/04/2020 AND CONTINUE FOR 14 DAYS THEN OFF A WEEK, THEN REPEAT 10/23/20 10/23/21  Magrinat, [name], MD  Cholecalciferol (VITAMIN D3) 250 MCG (10000 UT) capsule Take 10,000 Units by mouth daily.    [provider]  ferrous gluconate (FERGON) 324 MG tablet Take 1 tablet (324 mg total) by mouth daily with breakfast. 02/28/20   Magrinat, [name], MD  Misc Natural Products (PHYTOCILLIN) CAPS Take 2 capsules by mouth daily.    [provider]  OVER THE COUNTER MEDICATION Take 10 mLs by mouth 2 (two) times daily. floradix otc supplement    [provider]  OVER THE COUNTER MEDICATION Take 2 capsules by mouth daily. kyolic aged garlic otc supplement    [provider]  oxyCODONE (OXY IR/ROXICODONE) 5 MG immediate release tablet Take 1 tablet (5 mg total) by mouth every 4 (four) hours as needed for severe pain. 02/22/21   Eppie Gibson, MD  prochlorperazine (COMPAZINE) 10 MG tablet Take 1 tablet (10 mg total) by mouth every 6 (six) hours as needed (Nausea or vomiting). Patient not taking: Reported on 03/22/2020 10/17/19 06/01/20  Magrinat, [name], MD    Allergies    Emend [fosaprepitant]  Review of Systems   Review of Systems  Unable to perform ROS: Mental status change   Physical Exam Updated Vital Signs BP 96/78 (BP Location: Right Arm)   Pulse (!) 102   Temp 97.7 F (36.5 C) (Axillary)   Resp 18   Ht 5' 7"  (1.702 m)   Wt 78.9 kg   SpO2 100%   BMI 27.24 kg/m   Physical Exam Constitutional:      General: She is not in acute distress.    Appearance: Normal appearance. She is well-developed. She is not ill-appearing or diaphoretic.  HENT:     Head: Normocephalic and atraumatic.  Eyes:     General: Vision grossly  intact. Gaze aligned appropriately. Scleral icterus present.     Pupils: Pupils are equal, round, and reactive to light.  Neck:     Trachea: Trachea and phonation normal.  Pulmonary:     Effort: Pulmonary effort is normal. No respiratory distress.  Abdominal:     General: There is no distension.     Palpations: Abdomen is soft.     Tenderness: There is no abdominal tenderness. There is no guarding or rebound.  Musculoskeletal:        General: Normal range of motion.     Cervical back: Normal range of motion.  Skin:  General: Skin is warm and dry.  Neurological:     Mental Status: She is lethargic.     GCS: GCS eye subscore is 4. GCS verbal subscore is 5. GCS motor subscore is 6.     Comments: Speech is clear, tired, follows basic commands, alert to self place and date Moves all 4 extremities spontaneously.  Cranial nerves appear intact.  Psychiatric:        Behavior: Behavior normal.    ED Results / Procedures / Treatments   Labs (all labs ordered are listed, but only abnormal results are displayed) Labs Reviewed  COMPREHENSIVE METABOLIC PANEL - Abnormal; Notable for the following components:      Result Value   Sodium 127 (*)    Potassium 2.8 (*)    Chloride 89 (*)    CO2 17 (*)    Glucose, Bld 122 (*)    BUN 79 (*)    Creatinine, Ser 2.98 (*)    Calcium 7.2 (*)    Total Protein 4.7 (*)    Albumin 1.1 (*)    AST 260 (*)    ALT 63 (*)    Alkaline Phosphatase 2,031 (*)    Total Bilirubin 27.1 (*)    GFR, Estimated 19 (*)    Anion gap 21 (*)    All other components within normal limits  CBC WITH DIFFERENTIAL/PLATELET - Abnormal; Notable for the following components:   WBC 11.0 (*)    RBC 3.20 (*)    Hemoglobin 9.4 (*)    HCT 26.8 (*)    RDW 20.4 (*)    Platelets 90 (*)    nRBC 11.7 (*)    Neutro Abs 8.9 (*)    Lymphs Abs 0.3 (*)    Monocytes Absolute 1.1 (*)    Abs Immature Granulocytes 0.66 (*)    All other components within normal limits  PROTIME-INR -  Abnormal; Notable for the following components:   Prothrombin Time 16.6 (*)    INR 1.3 (*)    All other components within normal limits  AMMONIA - Abnormal; Notable for the following components:   Ammonia 70 (*)    All other components within normal limits  CBG MONITORING, ED - Abnormal; Notable for the following components:   Glucose-Capillary 112 (*)    All other components within normal limits  I-STAT BETA HCG BLOOD, ED ([facility], [facility], [facility] ONLY) - Abnormal; Notable for the following components:   I-stat hCG, quantitative 5.3 (*)    All other components within normal limits  MAGNESIUM  ETHANOL  URINALYSIS, ROUTINE W REFLEX MICROSCOPIC    EKG EKG Interpretation  Date/Time:  Wednesday March 06 2021 08:39:39 EDT Ventricular Rate:  106 PR Interval:  171 QRS Duration: 108 QT Interval:  376 QTC Calculation: 500 R Axis:   44 Text Interpretation: Sinus tachycardia Biatrial enlargement Anterior infarct, old Confirmed by Thamas Jaegers ([id number]) on 03/09/2021 9:29:42 AM  Radiology CT HEAD WO CONTRAST (5MM)  Result Date: 03/25/2021 CLINICAL DATA:  Seizure like activity, now altered mental status EXAM: CT HEAD WITHOUT CONTRAST TECHNIQUE: Contiguous axial images were obtained from the base of the skull through the vertex without intravenous contrast. COMPARISON:  Brain MRI 01/15/2021 FINDINGS: Brain: There is no evidence of acute intracranial hemorrhage, extra-axial fluid collection, or infarct. There is a 5-6 mm hypodense lesion in the left cerebellar hemisphere corresponding to the ring-enhancing lesion seen on the prior MRI. No other mass lesion is identified. There is no mass effect. There is no midline  shift. Vascular: No hyperdense vessel or unexpected calcification. Skull: There is heterogeneous density in the calvarium at the torcula and in the occipital bone just inferior to the lambdoid sutures at the midline corresponding to the enhancing lesion seen on the prior MRI. A lytic lesion is also seen  in the right parietal bone near the vertex (3-64). A mixed lytic lesion is seen in the left frontal bone just anterior to the suture (3-53). Sinuses/Orbits: The paranasal sinuses are clear. The globes and orbits are unremarkable. Other: None. IMPRESSION: 1. No acute intracranial pathology. 2. Subcentimeter lesion in the left cerebellar hemisphere corresponding to the ring-enhancing metastatic lesion seen on the prior MRI. No other mass lesion identified. No mass effect or midline shift. 3. Multiple calvarial metastases as above, grossly similar to the prior MRI allowing for differences in modality. Electronically Signed   By: Valetta Mole M.D.   On: 02/28/2021 10:03    Procedures .Critical Care  Date/Time: 03/01/2021 3:13 PM Performed by: Deliah Boston, PA-C Authorized by: Deliah Boston, PA-C   Critical care provider statement:    Critical care time (minutes):  31   Critical care was time spent personally by me on the following activities:  Discussions with consultants, evaluation of patient's response to treatment, examination of patient, ordering and performing treatments and interventions, ordering and review of laboratory studies, ordering and review of radiographic studies, pulse oximetry, re-evaluation of patient's condition, obtaining history from patient or surrogate, review of old charts and development of treatment plan with patient or surrogate   Medications Ordered in ED Medications  sodium chloride flush (NS) 0.9 % injection 10-40 mL (10 mLs Intracatheter Given 03/23/2021 1237)  sodium chloride flush (NS) 0.9 % injection 10-40 mL (has no administration in time range)  fentaNYL (SUBLIMAZE) injection 50 mcg (has no administration in time range)  potassium chloride 10 mEq in 100 mL IVPB (has no administration in time range)  levETIRAcetam (KEPPRA) IVPB 1500 mg/ 100 mL premix (0 mg Intravenous Stopped 03/27/2021 1048)  sodium chloride 0.9 % bolus 1,000 mL (0 mLs Intravenous Stopped  03/03/2021 1444)  fentaNYL (SUBLIMAZE) injection 50 mcg (50 mcg Intravenous Given 03/08/2021 1228)    ED Course  I have reviewed the triage vital signs and the nursing notes.  Pertinent labs & imaging results that were available during my care of the patient were reviewed by me and considered in my medical decision making (see chart for details).  Clinical Course as of 03/15/2021 1544  Wed Mar 06, 2021  1251 Ammonia(!) [NW]  1357 Dr. Cheral Marker [BM]  1404 Dr. Eulas Post [BM]  1506 Glyn Ade [BM]    Clinical Course User Index [BM] Deliah Boston, PA-C [NW] Vena Rua   MDM Rules/Calculators/A&P                           Additional history obtained from: Nursing notes from this visit. EMR. Family at bedside. -------------- 45 year old female presented via EMS for seizure-like activity, 5-minute history of generalized seizure while laying in bed.  No fall or injury.  No recent infectious symptoms.  Patient has known brain metastases, no previous seizure history, no medications prior to arrival.  On physical exam patient is lethargic but arousable to voice and oriented x3, denies pain.  She has notable scleral icterus on exam.  Patient's husband reports patient has liver metastases, unclear if scleral icterus is new.  Case discussed with attending physician Dr. Almyra Free, will obtain  basic labs as well as LFTs and CT head and continue monitoring.  1.5 g Keppra ordered for seizure control. --------------- CT head IMPRESSION:  1. No acute intracranial pathology.  2. Subcentimeter lesion in the left cerebellar hemisphere  corresponding to the ring-enhancing metastatic lesion seen on the  prior MRI. No other mass lesion identified. No mass effect or  midline shift.  3. Multiple calvarial metastases as above, grossly similar to the  prior MRI allowing for differences in modality   Patient arrived with IV in place right hand.  Nursing staff unable to draw blood from the IV or find  additional access.  I attempted to establish additional access with ultrasound of unable to establish.  Discussed case with Dr. Almyra Free, will consult IV team and continue monitoring.  On reassessment patient is mildly tachycardic, no acute distress, IV fluids ordered. --------------------- Nursing reported patient complaining of some right upper quadrant abdominal pain.  I reassessed the patient, she is resting comfortably bed no acute distress.  Patient still lethargic, she reports right upper quadrant abdominal pain, on my exam she is a soft abdomen does not react to deep palpation, no peritoneal signs.  Patient's husband at bedside reports that patient has been complaining of right upper quadrant abdominal pain for several weeks.  We are currently awaiting labs.  Fentanyl ordered for pain control. --------------------- I reviewed and interpreted labs which include: CBC shows hyponatremia 127, hypokalemia 2.8, AKI with creatinine 2.98 and BUN of 79 both which appear new.  Hypocalcemic at 7.2.  LFTs are elevated, AST 260, ALT 63.  Alkaline phosphatase 2031.  Total bilirubin 27.1.  These appear new as well.  Gap of 21. CBC shows mild leukocytosis of 11.0, anemia of 9.4, thrombocytopenia of 90. I-STAT beta-hCG 5.3 suspected false positive INR 1.3 CBG 112   Chart review shows patient had MRI of her abdomen which showed innumerable liver metastases.  Unclear if possible metastatic cause findings above.  I spoke with the patient's sister who is now at bedside, reports no recent illness but patient has seem to be slightly more confused over the last few days.  No complaints for her of pain, vomiting, diarrhea, cough or fever.  Case was rediscussed with attending physician Dr. Almyra Free, will obtain noncontrast CT abdomen pelvis as well as right upper quadrant ultrasound and consult for admission. -------------- 1:57 PM: Consult with neurology Dr. Cheral Marker advises he will be by to evaluate the patient.  2:04 PM:  Consult with internal medicine teaching service, Dr. Eulas Post.  Advises they will be down to admit the patient. - 2:10 PM: Patient reevaluated resting comfortably in bed no acute distress, sister is at bedside.  Patient remains slightly tachycardic otherwise vital signs are stable.  Patient and sister state understanding of plan of care and are agreeable for admission.  Patient seems more alert compared to earlier, ANO x3. - 3:10 PM: Consult with gastroenterology, Judson Roch.  Advises they will follow-up on CT abdomen pelvis and likely see patient in the morning. ---- 3:23 PM: Received Message from Azucena Freed, PA-C with gastroenterology.  Advised that Dr. Lyndel Safe looked at patient's CT scan, LFTs due to tumor burden and they do not feel her ERCP would be beneficial.  Advises GI is available as needed to admitting team.  I then updated Dr. Eulas Post outside patient room on GI consultation.  Evening team made aware of patient.  [name]: Portions of this report may have been transcribed using voice recognition software. Every effort was made to ensure  accuracy; however, inadvertent computerized transcription errors may still be present.  Final Clinical Impression(s) / ED Diagnoses Final diagnoses:  LFT elevation  Seizure [facility])    Rx / DC Orders ED Discharge Orders     None        Gari Crown 03/24/2021 1528    Gari Crown 03/05/2021 1546    Luna Fuse, MD 03/16/21 1552

## 2021-03-06 NOTE — ED Triage Notes (Signed)
[name] from home after having witnessed seizure by husband. Pt does not have hx of seizures. Pt however does have hx of stage 4 liver cancer. EMS reported 5 minutes of tonic clonic movement. Pt VS:  BP-130/70  P-120  Spo2- 99%  CBG 102   Pt post ictal at this time but responsive to voice.

## 2021-03-06 NOTE — ED Notes (Addendum)
Unsuccessful attempt at straight stick for ordered blood work

## 2021-03-06 NOTE — ED Notes (Signed)
Tried to get patient blood in 2 different areas, I didn't have any success. The Nurse was informed.

## 2021-03-06 NOTE — Procedures (Signed)
Patient Name: Krista [name]  MRN: [id number]  Epilepsy Attending: Lora Havens  Referring Physician/Provider: Myra Rude, NP Date: 03/24/2021  Duration: 31.33 mins  Patient history: 45 y.o. female with PMHx of right breast cancer in 2021 s/p lumpectomy with chemoradiation with multifocal liver lesions as well as bone and brain lesions who presented to the ED for evaluation of a witnessed 5 minute tonic-clonic seizure at home by husband.  EEG to evaluate for seizures.  Level of alertness: Awake, asleep  AEDs during EEG study: Keppra  Technical aspects: This EEG study was done with scalp electrodes positioned according to the 10-20 International system of electrode placement. Electrical activity was acquired at a sampling rate of '500Hz'$  and reviewed with a high frequency filter of '70Hz'$  and a low frequency filter of '1Hz'$ . EEG data were recorded continuously and digitally stored.   Description: No clear posterior dominant rhythm was seen.. Sleep was characterized by vertex waves, sleep spindles (12 to 14 Hz), maximal frontocentral region.  EEG showed continuous generalized 3 to 6 Hz theta and delta slowing.  Sharp transients were also noted in left frontal region..  Hyperventilation and photic stimulation were not performed.     ABNORMALITY - Continuous slow, generalized  IMPRESSION: This study is suggestive of moderate diffuse encephalopathy, nonspecific etiology.  No seizures or definite epileptiform discharges were seen throughout the recording.  If suspicion for ictal-interictal activity remains a concern, a prolonged study can be considered.     Krista [name]

## 2021-03-06 NOTE — ED Notes (Addendum)
Family called RN into the room for seizure-like activity.  Pt with R sided gaze, unresponsive and sats unreadable.  Placed on O2.  MD paged.  Neurology at bedside. Admitting ordered versed, but neurology stated to hold.  Pt is responding to name.

## 2021-03-06 NOTE — H&P (Addendum)
Date: 03/11/2021               Patient Name:  Krista [name] MRN: [id number]  DOB: 01-Apr-1976 Age / Sex: 45 y.o., female   PCP: Everett Graff, MD              Medical Service: Internal Medicine Teaching Service              Attending Physician: Dr. [name], MD    First Contact: Shea Evans, MS 3 Pager: [phone]  Second Contact: Dr. Farrel Gordon Pager: [phone]  Third Contact Dr. Rick Duff Pager: [phone]       After Hours (After 5p/  First Contact Pager: [phone]  weekends / holidays): Second Contact Pager: [phone]   Chief Complaint: Seizure  History of Present Illness: Ms. Krista [name] is a 45 year old female with a past medical history of anemia, sciatica, and right metastatic breast cancer who is presenting with a seizure. Last night, her husband noticed her speech was becoming increasingly incoherent but it improved before she went to sleep. This morning, the patient was becoming more incoherent again, and as she was going to the bathroom, she fell, and her husband witnessed the seizure. She had shaking of her extremities, and her eyes rolled back into her head, and the convulsions were continuous for about 5 minutes. The patient did not bite her tongue and did not sustain any injuries. She had never had a seizure before. EMS arrived in less than 10 minutes.  The patient was brought to the hospital by ambulance. She has not tried to walk since the seizure, and she has been "in and out". The change in her mentation has been very sudden. She has slept most of the day for the last two weeks. She was able to work at her job last week and had no issues other than her chronic pain. She did not get up at all yesterday. She has not had much of an appetite because she has felt nauseous and has vomited. She has been drinking sips of water and eating bites of fruit. She has not had any new pain since the seizure, and whenever she feels pain, she becomes alert. She  has a prescription for oxycodone, but she had been refusing to use it because she helps counsel patients with addiction. The patient's sister knew that something was wrong with the patient when she started preferring oxycodone over her tylenol in the past few weeks.  The patient was diagnosed with right breast cancer in spring 2021. She had a difficult time with the side effects of chemotherapy, then she tried radiation, but was unsatisfied with the treatments. They found metastasis after her first round of chemoradiation. This past spring, the hospice conversation was started. In July, possible metastasis to the brain was seen by an oncologist, Dr. Jana Hakim, and she met with a neuro-oncologist, Dr. Mickeal Skinner. She did not want brain surgery and decided to try radiation again. She stopped taking chemotherapy pills over a month ago, and she had radiation every day last week to her spine. 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 The radiation was making her feel worse and appear worse to others.   The patient was accompanied by her sister, Kenney Houseman, who gave the history for the patient bedside because the patient was feeling too weak to speak.   Meds:  Oxycodone PRN Tylenol PRN Current Meds  Medication Sig   oxyCODONE (OXY IR/ROXICODONE) 5 MG immediate release tablet Take 1 tablet (5  mg total) by mouth every 4 (four) hours as needed for severe pain.     Allergies: Allergies as of 03/26/2021 - Review Complete 02/07/2021  Allergen Reaction Noted   Emend [fosaprepitant] Shortness Of Breath 11/16/2019   Past Medical History:  Diagnosis Date   Anemia    history of   Cancer ([place])    right breast cancer 10/03/19, received chemotherapy   Family history of breast cancer    Family history of cervical cancer    Family history of lung cancer    Hemorrhoids     Family History: She has several aunts on her father's side of the family with breast cancer. There is also a family history of cervical and  lung cancer.   Social History: She lives with her husband and three children. 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  Review of Systems: A complete ROS was negative except as per HPI.   Physical Exam: Blood pressure 96/78, pulse (!) 102, temperature 97.7 F (36.5 C), temperature source Axillary, resp. rate 18, height 5' 7"  (1.702 m), weight 78.9 kg, SpO2 100 %.  CBC    Component Value Date/Time   WBC 11.0 (H) 03/13/2021 1213   RBC 3.20 (L) 03/21/2021 1213   HGB 9.4 (L) 03/05/2021 1213   HGB 11.8 (L) 10/17/2019 1529   HCT 26.8 (L) 03/20/2021 1213   PLT 90 (L) 03/18/2021 1213   PLT 269 10/17/2019 1529   MCV 83.8 02/28/2021 1213   MCH 29.4 03/11/2021 1213   MCHC 35.1 03/12/2021 1213   RDW 20.4 (H) 03/16/2021 1213   LYMPHSABS 0.3 (L) 03/20/2021 1213   MONOABS 1.1 (H) 03/29/2021 1213   EOSABS 0.0 03/14/2021 1213   BASOSABS 0.0 03/19/2021 1213    CMP     Component Value Date/Time   NA 127 (L) 03/16/2021 1213   K 2.8 (L) 03/05/2021 1213   CL 89 (L) 03/10/2021 1213   CO2 17 (L) 03/05/2021 1213   GLUCOSE 122 (H) 03/05/2021 1213   BUN 79 (H) 02/28/2021 1213   CREATININE 2.98 (H) 03/29/2021 1213   CREATININE 0.72 12/17/2020 1149   CALCIUM 7.2 (L) 03/24/2021 1213   PROT 4.7 (L) 03/05/2021 1213   ALBUMIN 1.1 (L) 03/16/2021 1213   AST 260 (H) 03/26/2021 1213   AST 104 (H) 12/17/2020 1149   ALT 63 (H) 03/13/2021 1213   ALT 53 (H) 12/17/2020 1149   ALKPHOS 2,031 (H) 03/25/2021 1213   BILITOT 27.1 (HH) 03/22/2021 1213   BILITOT 1.0 12/17/2020 1149   GFRNONAA 19 (L) 03/26/2021 1213   GFRNONAA >60 12/17/2020 1149   GFRAA >60 03/27/2020 1008   GFRAA >60 10/17/2019 1529   Ammonia: 70 PT: 16.6 INR: 1.3  Physical Exam Vitals reviewed.  Constitutional:      Appearance: She is not ill-appearing.     Comments: Patient appeared weak and tired  HENT:     Head: Normocephalic and atraumatic.  Pulmonary:     Effort: Pulmonary effort is normal.     Breath sounds:  Normal breath sounds.  Musculoskeletal:     Comments: Patient groaned raising both arms but could raise them slowly independently Pain in both legs when they were pressed and severely limited range of motion  Skin:    General: Skin is warm and dry.  Neurological:     Mental Status: She is disoriented.     Motor: Weakness present.     EKG: personally reviewed my interpretation is sinus tachycardia with  evidence of a prior anterior infarct  CT HEAD WITHOUT CONTRAST:   TECHNIQUE: Contiguous axial images were obtained from the base of the skull through the vertex without intravenous contrast.   COMPARISON:  Brain MRI 01/15/2021   FINDINGS: Brain: There is no evidence of acute intracranial hemorrhage, extra-axial fluid collection, or infarct.   There is a 5-6 mm hypodense lesion in the left cerebellar hemisphere corresponding to the ring-enhancing lesion seen on the prior MRI. No other mass lesion is identified. There is no mass effect. There is no midline shift.   Vascular: No hyperdense vessel or unexpected calcification.   Skull: There is heterogeneous density in the calvarium at the torcula and in the occipital bone just inferior to the lambdoid sutures at the midline corresponding to the enhancing lesion seen on the prior MRI. A lytic lesion is also seen in the right parietal bone near the vertex (3-64). A mixed lytic lesion is seen in the left frontal bone just anterior to the suture (3-53).   Sinuses/Orbits: The paranasal sinuses are clear. The globes and orbits are unremarkable.   Other: None.   IMPRESSION: 1. No acute intracranial pathology. 2. Subcentimeter lesion in the left cerebellar hemisphere corresponding to the ring-enhancing metastatic lesion seen on the prior MRI. No other mass lesion identified. No mass effect or midline shift. 3. Multiple calvarial metastases as above, grossly similar to the prior MRI allowing for differences in modality.   CT  ABDOMEN AND PELVIS WITHOUT CONTRAST:   TECHNIQUE: Multidetector CT imaging of the abdomen and pelvis was performed following the standard protocol without IV contrast.   COMPARISON:  Abdominopelvic CT 10/31/2019. Abdominal MRI 12/27/2020. Chest CT 01/01/2021.   FINDINGS: Study was performed with the patient's arms at her side. This degrades image quality to some degree.   Lower chest: Small right pleural effusion with associated mild atelectasis at the right lung base.   Hepatobiliary: Progressive widespread hepatic metastatic disease has increased from the MRI, with lesions occupying the majority of the liver. There is a lesion in the dome of the right lobe which measures approximately 7.4 x 5.7 cm on image 13/3, previously 4.6 x 4.0 cm. The liver is enlarged, spanning 23.8 cm in height. Mildly increased density within the gallbladder lumen without evidence of stones, surrounding inflammation or biliary ductal dilatation.   Pancreas: Unremarkable. No pancreatic ductal dilatation or surrounding inflammatory changes.   Spleen: Normal in size without focal abnormality.   Adrenals/Urinary Tract: Both adrenal glands appear normal. Both kidneys appear unremarkable. No evidence of urinary tract calculus, hydronephrosis or perinephric soft tissue stranding. The bladder appears unremarkable.   Stomach/Bowel: No enteric contrast administered. Mild nonspecific small bowel and proximal colonic wall thickening, likely due to ascites. No evidence of obstruction or focal surrounding inflammation. The appendix appears normal.   Vascular/Lymphatic: No discrete enlarged abdominopelvic lymph nodes are identified. No significant vascular findings on noncontrast imaging.   Reproductive: Chronic lobular enlargement of the uterus consistent with fibroids. No adnexal mass.   Other: New moderate amount of low-density ascites throughout the abdomen and pelvis. No peritoneal or omental masses  identified. Mild generalized soft tissue edema.   Musculoskeletal: Progressive widespread osseous metastatic disease with multiple enlarging lesions throughout the visualized thoracic and lumbar spine. There is progressive loss of height at a pathologic fracture involving T7 compared with the chest CT of 2 months ago. There is also a pathologic fracture of the upper left sacrum associated with an enlarging lytic lesion measuring up to 5.8  x 4.3 cm on image 71/3. No significant intraspinal tumor identified. There is a large lytic lesion involving the right femoral neck which places the patient at risk for pathologic fracture. A lytic lesion is also noted in the right inferior pubic ramus.   IMPRESSION: 1. Progressive widespread hepatic metastatic disease. 2. Progressive widespread osseous metastatic disease with progressive pathologic fracture at T7 and in the upper left sacrum. A large lesion in the right femoral neck places the patient at risk for a pathologic fracture. 3. New moderate ascites without peritoneal nodularity. Small right pleural effusion. 4. Mild small bowel and right colonic wall thickening, nonspecific and likely related to the patient's ascites.  EEG: Level of alertness: Awake, asleep   AEDs during EEG study: Keppra   Technical aspects: This EEG study was done with scalp electrodes positioned according to the 10-20 International system of electrode placement. Electrical activity was acquired at a sampling rate of 500Hz  and reviewed with a high frequency filter of 70Hz  and a low frequency filter of 1Hz . EEG data were recorded continuously and digitally stored.    Description: No clear posterior dominant rhythm was seen.. Sleep was characterized by vertex waves, sleep spindles (12 to 14 Hz), maximal frontocentral region.  EEG showed continuous generalized 3 to 6 Hz theta and delta slowing.  Sharp transients were also noted in left frontal region..  Hyperventilation  and photic stimulation were not performed.      ABNORMALITY - Continuous slow, generalized   IMPRESSION: This study is suggestive of moderate diffuse encephalopathy, nonspecific etiology.  No seizures or definite epileptiform discharges were seen throughout the recording.   If suspicion for ictal-interictal activity remains a concern, a prolonged study can be considered.     Assessment & Plan by Problem: Active Problems:   * No active hospital problems. *  Ms. Krista [name] is a 45 year old female with a past medical history of metastatic breast cancer who is presenting with a seizure and is being admitted for seizure.   #Tonic clonic Seizure Patient reportedly has had a rapid decline over the last week or two with respect to her mental and functional status. She has been eating and drinking minimally over this time per her spouse. On admission labs she was noted to have several metabolic derangements including metabolic acidosis, hypokalemia, hyponatremia, hypocalcemia, as well as significantly elevated LFTs, and elevated ammonia. This is what most likely precipitated her tonic-clonic seizure this AM. She has received IV Keppra 1559m and her electrolytes are being corrected. Routine EEG suggestive of moderate diffuse encephalopathy with no evidence of seizure like activity. She continues to be very lethargic, possibly related to a post ictal state versus her significant metabolic derangements.   Neurology reviewed imaging of patient's known brain metastasis and felt this was less likely to be the cause of her recent seizure given its location.  - Correct electrolytes - Lactulose enemas  - F/u neuro recommendations - Seizure precautions  #Altered mental status  The patient has had a sharp decline in her mental status over the past 2 days per her husband. And has been more confused since her seizure. Her symptoms are likely multifactorial in etiology with her post ictal state,  hepatic encephalopathy, and metabolic derangements contributing. Her ammonia and LFTs are elevated. EEG suggestive of moderate diffuse encephalopathy, nonspecific etiology. She is s/p bolus of NS x2.  - IVFs  - Lactulose enema - NPO  #AG Metabolic Acidosis  Likely in the setting of poor oral intake over the  last several days. Along with patient's recent seizure. -F/u Beta hydroxybutyrate and lactate  -Continue IVFs  #Hyponatremia  Likely 2/2 to poor oral intake.  -IVFs per above   #Hypokalemia Patient's potassium was 2.8. Her potassium was normal a month ago. - Continue IV repletion   #Metastatic Breast Cancer The patient found a lump on self-breast examination in late 2020 and brought it to medical attention. Bilateral diagnostic mammography with tomography and bilateral breast ultrasound in March 2021 showed a 5.1cm mass involving the upper inner quadrant of the breast. 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 It was also HER2 negative. Patient had multiple calvarial metastases identified on her head CT without contrast, and metastasis from her right breast has also gone to her spine and liver. She has undergone multiple rounds of chemoradiation. Her most recent radiation was likely palliative in speaking to her sister and husband for pain control. Patient has known metastatic disease to her liver, brain, and spine. Multiple liver lesions resulting in significantly elevated bilirubin and other LFTs. GI was consulted this admission and stated there was nothing to do from there standpoint.  - F/u with husband regarding [place] - Consider oncology consult to patient's oncologist -Consider palliative care c/s for assistance with GOC clarification   #Leg and Back Pain The patient has a history of chronic back pain likely secondary to her spine metastases and her sciactica. She has sciatica. - IV HM  Diet: NPO  FEN: IVF (LR 185m/hr)   VTE:  Enoxaparin (LOVENOX) 346m Code: Full AB: None Status: Inpatient  Barriers to Discharge: None   Signed: SmDanie ChandlerMedical Student 03/13/2021, 3:39 PM   Attestation for Student Documentation:  I personally was present and performed or re-performed the history, physical exam and medical decision-making activities of this service and have verified that the service and findings are accurately documented in the student's note.  CaRick DuffMD 03/16/2021, 7:25 PM

## 2021-03-06 NOTE — Consult Note (Signed)
Neurology Consultation  Reason for Consult: New onset seizure with history of metastatic brain lesion identified 01/16/2021 Referring Physician: Dr. Almyra Free  CC: Altered mental status with confusion, witnessed seizure this morning  History is obtained from: Patient's sister at bedside, patient's husband via telephone, and chart review  HPI: Krista [name] is a 45 y.o. female with a medical history significant for right breast cancer in 2021 s/p lumpectomy with chemoradiation with multifocal liver lesions as well as bone and brain lesions, anemia, and hemorrhoids who presented to the ED 03/18/2021 for evaluation of a seizure at home witnessed by patient's husband. Patient's husband states that he and [name] were talking last night and he noticed that she sounded confused and was not totally making sense with her speech and he thought about bringing her into the hospital at that time but she improved and fell asleep. This morning when she woke up, he was trying to assist her out of bed to go to the bathroom when her face suddenly contorted, her eyes rolled back, and she began to have convulsions of all of her extremities with short in and out breaths. Her husband activated EMS and on EMS arrival, she had stopped and she was post-ictal with lethargy and confusion. Her seizure activity lasted approximately 5 minutes before aborting spontaneously without tongue biting and unknown if she was incontinent of urine. 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 Mickeal Skinner, however, she has not followed up with him at this time. On chart review, her radiation oncologist notes that while she is a candidate for radiosurgery to the brain, she made the decision to only pursue palliative procedures and that radiosurgery to the brain would likely not improve her overall progress;  radiotherapy treatment was deferred at that time.   Of note, patient's sister at bedside and husband via telephone note rapid decline in her functional status over the past week. According to patient's sister, patient was walking just 5 days ago without difficulty and she is no longer able to lift her legs off of the bed or get out of bed. Her sister states that [name] was able to sit up in the bed on Monday but could not get up independently, and over the past 2 days has not been able to get out of bed at all without total assistance. Her sister also notes that patient's jaundiced appearance and scleral icterus are also new and that she does not recall seeing any skin yellowing when she visited on Monday, but did note for the first time some yellowing of her sister's eyes. Family also notes progressive confusion over the last few days first noted on Monday 9/5 with what was minimal confusion, then while talking with husband last night, she began to have significant confusion to where she could not remember her birthday and was calling family members the wrong name. Prior to 2 days ago, she did not have any noted confusion.  ROS: Unable to obtain due to altered mental status.   Past Medical History:  Diagnosis Date   Anemia    history of   Cancer ([place])    right breast cancer 10/03/19, received chemotherapy   Family history of breast cancer    Family history of cervical cancer    Family history of lung cancer    Hemorrhoids    Past Surgical History:  Procedure Laterality  Date   AXILLARY LYMPH NODE DISSECTION Right 05/10/2020   Procedure: RIGHT AXILLARY LYMPH NODE DISSECTION;  Surgeon: Erroll Luna, MD;  Location: [facility];  Service: General;  Laterality: Right;   BREAST LUMPECTOMY WITH RADIOACTIVE SEED AND SENTINEL LYMPH NODE BIOPSY Right 04/05/2020   Procedure: RIGHT BREAST BRACKETED LUMPECTOMY WITH RADIOACTIVE SEED AND RIGHT AXILLARY  TARGETED LYMPH NODE BIOPSY AND  SENTINEL LYMPH NODE MAPPING;  Surgeon: Erroll Luna, MD;  Location: [facility];  Service: General;  Laterality: Right;  PEC BLOCK   IR IMAGING GUIDED PORT INSERTION  11/07/2019   PORT-A-CATH REMOVAL Left 04/05/2020   Procedure: REMOVAL PORT-A-CATH;  Surgeon: Erroll Luna, MD;  Location: [facility];  Service: General;  Laterality: Left;   Family History  Problem Relation Age of Onset   Breast cancer Mother 14       double mastectomy   Breast cancer Half-Sister 53   Cirrhosis Half-Sister    Heart Problems Father    Stroke Father    Breast cancer Maternal Aunt 53   Cancer Maternal Uncle 29       unknown type   Ovarian cancer Maternal Grandmother        dx. in her mid-40s   Lung cancer Paternal Grandmother    Cancer Other        unknown type; maternal great-uncles/aunts (x3)   Diabetes Half-Brother    Cancer Other        unknown type; matenral great-uncles/aunt (x4)   Cancer Maternal Great-grandfather        unknown type   Breast cancer Cousin 62       paternal cousin   Social History:   reports that she has never smoked. She has never used smokeless tobacco. She reports that she does not currently use alcohol. She reports that she does not use drugs.  Medications  Current Facility-Administered Medications:    fentaNYL (SUBLIMAZE) injection 50 mcg, 50 mcg, Intravenous, Once, Morelli, Brandon A, PA-C   potassium chloride 10 mEq in 100 mL IVPB, 10 mEq, Intravenous, Once, Morelli, Brandon A, PA-C   sodium chloride flush (NS) 0.9 % injection 10-40 mL, 10-40 mL, Intracatheter, Q12H, Hong, Joshua S, MD, 10 mL at 03/02/2021 1237   sodium chloride flush (NS) 0.9 % injection 10-40 mL, 10-40 mL, Intracatheter, PRN, Hong, Greggory Brandy, MD  Current Outpatient Medications:    acetaminophen (TYLENOL) 500 MG tablet, Take 1-2 tablets (500-1,000 mg total) by mouth every 6 (six) hours as needed. (Patient not taking: No sig reported), Disp: 60 tablet, Rfl: 0   Biotin w/ Vitamins C & E (HAIR/SKIN/NAILS PO),  Take 1 tablet by mouth daily., Disp: , Rfl:    capecitabine (XELODA) 500 MG tablet, TAKE 3 TABLETS (1500 MG) BY MOUTH 2 TIMES DAILY, START ON 10/04/2020 AND CONTINUE FOR 14 DAYS THEN OFF A WEEK, THEN REPEAT, Disp: 84 tablet, Rfl: 4   Cholecalciferol (VITAMIN D3) 250 MCG (10000 UT) capsule, Take 10,000 Units by mouth daily., Disp: , Rfl:    ferrous gluconate (FERGON) 324 MG tablet, Take 1 tablet (324 mg total) by mouth daily with breakfast., Disp: 100 tablet, Rfl: 3   Misc Natural Products (PHYTOCILLIN) CAPS, Take 2 capsules by mouth daily., Disp: , Rfl:    OVER THE COUNTER MEDICATION, Take 10 mLs by mouth 2 (two) times daily. floradix otc supplement, Disp: , Rfl:    OVER THE COUNTER MEDICATION, Take 2 capsules by mouth daily. kyolic aged garlic otc supplement, Disp: , Rfl:    oxyCODONE (  OXY IR/ROXICODONE) 5 MG immediate release tablet, Take 1 tablet (5 mg total) by mouth every 4 (four) hours as needed for severe pain., Disp: 30 tablet, Rfl: 0  Exam: Current vital signs: BP 96/78 (BP Location: Right Arm)   Pulse (!) 102   Temp 97.7 F (36.5 C) (Axillary)   Resp 18   Ht '5\' 7"'$  (1.702 m)   Wt 78.9 kg   SpO2 100%   BMI 27.24 kg/m  Vital signs in last 24 hours: Temp:  [97.7 F (36.5 C)] 97.7 F (36.5 C) (09/07 0840) Pulse Rate:  [102-110] 102 (09/07 1459) Resp:  [14-25] 18 (09/07 1459) BP: (96-120)/(72-91) 96/78 (09/07 1459) SpO2:  [99 %-100 %] 100 % (09/07 1459) Weight:  [78.9 kg] 78.9 kg (09/07 0841)  GENERAL: Asleep, laying in ER stretcher, she appears uncomfortable and complains of leg pain Head: Normocephalic and atraumatic, without obvious abnormality EENT: Scleral icterus present, dry mucous membranes, no OP obstruction LUNGS: Normal respiratory effort. Non-labored breathing on room air, SpO2 100% on monitor CV: Tachycardia on cardiac monitor, extremities warm, well perfused ABDOMEN: Soft, non-tender, non-distended Extremities: warm, without obvious deformity Skin:  Jaundiced  NEURO:  Mental Status: Lethargic, requires constant stimulation for participation. Poor attention noted.  She opens eyes briefly to voice but quickly closes them and falls back to sleep.  She follows some simple commands intermittently with repeat instruction. She does not speak above a whisper and most of her speech is unintelligible She is able to state her name and that she is in the hospital in [place]. She incorrectly states that her age is 61 and perseverates on "54". She states that the month and the year are "30".  Cranial Nerves:  II: PERRL 2 mm/brisk.  III, IV, VI: EOMI, will fixate and track examiner and other verbal stimuli.  V: Sensation is intact to light touch and symmetrical to face.  VII: Face is symmetric resting and smiling.  VIII: Hearing is intact to voice IX, X: Palate elevation is symmetric. XI: Normal sternocleidomastoid and trapezius muscle strength XII: Tongue protrudes midline. Motor: Patient requires constant stimulation for intermittent participation. She is able to squeeze equally with both hands 5/5 grip strength.  She is able to briefly withstand antigravity positioning of both arms when examiner passively elevates them but then immediately drifts to the bed.  She wiggles toes bilaterally but does not elevate BLE off of bed.. She also complains of significant left > right lower extremity pain with movement.  Tone is normal. Bulk is normal.  Sensation: Intact to light touch bilaterally in all four extremities.  Coordination: Unable to assess, patient does not participate.  Plantars: Toes mute bilaterally Gait: Deferred  Labs I have reviewed labs in epic and the results pertinent to this consultation are: CBC    Component Value Date/Time   WBC 11.0 (H) 03/05/2021 1213   RBC 3.20 (L) 03/26/2021 1213   HGB 9.4 (L) 03/01/2021 1213   HGB 11.8 (L) 10/17/2019 1529   HCT 26.8 (L) 03/04/2021 1213   PLT 90 (L) 03/26/2021 1213   PLT 269  10/17/2019 1529   MCV 83.8 03/28/2021 1213   MCH 29.4 03/08/2021 1213   MCHC 35.1 03/21/2021 1213   RDW 20.4 (H) 03/28/2021 1213   LYMPHSABS 0.3 (L) 03/12/2021 1213   MONOABS 1.1 (H) 03/02/2021 1213   EOSABS 0.0 03/09/2021 1213   BASOSABS 0.0 03/03/2021 1213   CMP     Component Value Date/Time   NA 127 (L) 03/20/2021 1213  K 2.8 (L) 03/18/2021 1213   CL 89 (L) 03/10/2021 1213   CO2 17 (L) 03/02/2021 1213   GLUCOSE 122 (H) 03/05/2021 1213   BUN 79 (H) 03/24/2021 1213   CREATININE 2.98 (H) 03/05/2021 1213   CREATININE 0.72 12/17/2020 1149   CALCIUM 7.2 (L) 03/09/2021 1213   PROT 4.7 (L) 03/16/2021 1213   ALBUMIN 1.1 (L) 03/13/2021 1213   AST 260 (H) 02/28/2021 1213   AST 104 (H) 12/17/2020 1149   ALT 63 (H) 03/22/2021 1213   ALT 53 (H) 12/17/2020 1149   ALKPHOS 2,031 (H) 03/27/2021 1213   BILITOT 27.1 (HH) 03/05/2021 1213   BILITOT 1.0 12/17/2020 1149   GFRNONAA 19 (L) 03/23/2021 1213   GFRNONAA >60 12/17/2020 1149   GFRAA >60 03/27/2020 1008   GFRAA >60 10/17/2019 1529   Lipid Panel  No results found for: CHOL, TRIG, HDL, CHOLHDL, VLDL, LDLCALC, LDLDIRECT No results found for: HGBA1C  Imaging I have reviewed the images obtained:  MRI examination of the brain wwo 01/15/2021: 1. Peripherally enhancing 5 mm lesion in the posterior left cerebellar hemisphere, concerning for a metastasis given the patient's known metastatic breast cancer. 2. Additional punctate focus of enhancement within the right cerebellum may represent an additional tiny metastasis. 3. Skull base and calvarial osseous metastatic disease. A 7 mm lesion at the high right vertex obliterates the inner calvarial table and may see extend intracranially to involve the dura. No adjacent brain edema or mass effect.  CT Head WO contrast 03/17/2021: 1. No acute intracranial pathology. 2. Subcentimeter lesion in the left cerebellar hemisphere corresponding to the ring-enhancing metastatic lesion seen on the prior  MRI. No other mass lesion identified. No mass effect or midline shift. 3. Multiple calvarial metastases as above, grossly similar to the prior MRI allowing for differences in modality.  CT Abdomen & Pelvis: 1. Progressive widespread hepatic metastatic disease. 2. Progressive widespread osseous metastatic disease with progressive pathologic fracture at T7 and in the upper left sacrum. A large lesion in the right femoral neck places the patient at risk for a pathologic fracture. 3. New moderate ascites without peritoneal nodularity. Small right pleural effusion. 4. Mild small bowel and right colonic wall thickening, nonspecific and likely related to the patient's ascites.  Assessment: 45 y.o. female with PMHx of right breast cancer in 2021 s/p lumpectomy with chemoradiation with multifocal liver lesions as well as bone and brain lesions who presented to the ED for evaluation of a witnessed 5 minute tonic-clonic seizure at home by husband. Per family, patient has had a dramatic and rapid cognitive and functional decline over the past 5 days with a more significant decline in the past 2 days, contemporaneously with the new onset and progression of her scleral icterus and jaundice.  - Examination reveals lethargic patient who is oriented to self and place but is unable to state her age, the year, the month, or provide any details regarding her HPI. She has significant weakness and pain of BLE and follows minimal commands. She is jaundiced throughout and has scleral icterus which is new in onset since Monday 9/5 per sister at bedside.  [facility] is without acute intracranial pathology but reveals a subcentimeter lesion in the left cerebellar hemisphere corresponding to the ring-enhancing metastatic lesion from prior MRI concerning for brain metastases.  - Per documentation, patient has chosen not to undergo treatment for brain lesion and requested to pursue palliative procedures. She has been recommended to follow  up with Dr. Mickeal Skinner with  neuro oncology but has not seen him to date.  - Patient presented following a witnessed seizure at home. GTC for 5 minutes that aborted spontaneously. No history of seizures. Post-ictal with lethargy and confusion.  - Acute encephalopathy, confusion, lethargy, and seizure etiology is felt to be secondary to toxic-metabolic derangements with significant liver involvement with acute jaundice, scleral icterus, and significant elevation of transaminases and ammonia with imaging evidence of progressive widespread hepatic metastatic disease. 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 However, due to her chronic illness with metastasis precipitating metabolic derangements, we will start patient on Keppra for seizure prophylaxis.    Impression: -New onset seizure  -Breast caner with liver, bone, and brain metastases -Acute metabolic encephalopathy   Recommendations: - Routine EEG - MRI brain wwo contrast - Keppra 1,500 mg loaded in the ED - Start Keppra 500 mg IV BID - Inpatient seizure precautions - PRN Ativan 2 mg IV PRN for any seizure lasting > 5 minutes and notify neurology - Will need to discuss with Dr. Mickeal Skinner of neuro oncology following imaging  - Management of metabolic derangements per primary team  Addendum: EEG: - Continuous slow, generalized. This study is suggestive of moderate diffuse encephalopathy, nonspecific etiology.  No seizures or definite epileptiform discharges were seen throughout the recording.  Pt seen by NP/Neuro and MD.   Anibal Henderson, AGAC-NP [facility] Pager: [phone]  I have seen and examined the patient. I have formulated the assessment and plan and discussed with the Neurology NP. 44 y.o. female with PMHx of right breast cancer in  2021 s/p lumpectomy with chemoradiation with multifocal liver lesions as well as bone and brain lesions who presented to the ED for evaluation of a witnessed 5 minute tonic-clonic seizure at home by husband. Per family, patient has had a dramatic and rapid cognitive and functional decline over the past 5 days with a more significant decline in the past 2 days, contemporaneously with the new onset and progression of her scleral icterus and jaundice. Exam reveals a lethargic and encephalopathic patient. Recommendations include EEG, MRI and initiation of Keppra.  Electronically signed: Dr. Kerney Elbe

## 2021-03-06 NOTE — Hospital Course (Addendum)
[name] is a 45 year old female with a past medical history of anemia, sciatica, and right metastatic breast cancer who is presenting with a seizure.  #Tonic clonic Seizure Patient reportedly has had a rapid decline over the last week or two with respect to her mental and functional status. She has been eating and drinking minimally over this time per her spouse. On admission labs she was noted to have several metabolic derangements including metabolic acidosis, hypokalemia, hyponatremia, hypocalcemia, as well as significantly elevated LFTs, and elevated ammonia. This is what most likely precipitated her tonic-clonic seizure Wednesday morning. She has received IV Keppra 1526m and her electrolytes were being corrected. She was placed on seizure precautions. Routine EEG suggestive of moderate diffuse encephalopathy with no evidence of seizure like activity. She was very lethargic on 9/7, possibly related to her significant metabolic derangements versus a post-ictal state. Neurology reviewed imaging of patient's known brain metastasis and felt this was less likely to be the cause of her recent seizure given its location.  Her condition has worsened on 9/8.     - Correct electrolytes - Lactulose enemas  - F/u neuro recommendations   #Altered mental status  The patient has had a sharp decline in her mental status over the past 2 days per her husband. And has been more confused since her seizure. Her symptoms are likely multifactorial in etiology with her post ictal state, hepatic encephalopathy, and metabolic derangements contributing. Her ammonia and LFTs are elevated. EEG suggestive of moderate diffuse encephalopathy, nonspecific etiology. She is s/p bolus of NS x2.  - IVFs  - Lactulose enema - NPO   #AG Metabolic Acidosis  Likely in the setting of poor oral intake over the last several days. Along with patient's recent seizure. -F/u Beta hydroxybutyrate and lactate  -Continue  IVFs   #Hyponatremia  Likely 2/2 to poor oral intake.  -IVFs per above    #Hypokalemia Patient's potassium was 2.8. Her potassium was normal a month ago. - Continue IV repletion     #Metastatic Breast Cancer The patient found a lump on self-breast examination in late 2020 and brought it to medical attention. Bilateral diagnostic mammography with tomography and bilateral breast ultrasound in March 2021 showed a 5.1cm mass involving the upper inner quadrant of the breast. 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 It was also HER2 negative. Patient had multiple calvarial metastases identified on her head CT without contrast, and metastasis from her right breast has also gone to her spine and liver. She has undergone multiple rounds of chemoradiation. Her most recent radiation was likely palliative in speaking to her sister and husband for pain control. Patient has known metastatic disease to her liver, brain, and spine. Multiple liver lesions resulting in significantly elevated bilirubin and other LFTs. GI was consulted this admission and stated there was nothing to do from there standpoint.  - F/u with husband regarding GMaiden- Consider oncology consult to patient's oncologist -Consider palliative care c/s for assistance with GOC clarification    #Leg and Back Pain The patient has a history of chronic back pain likely secondary to her spine metastases and her sciactica. She has sciatica. - IV HM

## 2021-03-06 NOTE — ED Notes (Signed)
Failed attempts x3 to obtain blood. Notified [place] PA. Not able to get ultrasound IV for 2nd access. IV team consulted.

## 2021-03-06 NOTE — ED Notes (Signed)
Placed a external cath patient is resting with family at bedside and call bell in reach

## 2021-03-06 NOTE — Progress Notes (Addendum)
On-call neurology note  Subjective: Recalled for seizure activity lasting about 4 min. Resolved w/o benzo. Rt gaze, whole body stiffened with arms raised, and shaking.  Objective: On exam, severely icteric, she is extremely obtunded, only opens eyes to sternal rub, does not follow commands, gaze is midline with off-and-on roving eye movements to both sides-no preference or deviation noted.  Was not actively seizing or shaking at the time of my examination but has subtle twitching all over the body-question myoclonic in the setting of hyperammonemia. On noxious stimulation, she is able to withdraw in all 4 extremities.  Imaging personally reviewed CT head with no acute intercranial normality.  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  Grossly similar to prior MRI.  Pertinent labs: Glucose 138, sodium 137, potassium 4.0, creatinine 0.60, BUN 12-the most recent labs seem wrong because her bilirubin was 27.1 at afternoon  Assessment 45 year old with past history of breast cancer along with multiple metastasis including that the liver and cerebellum presented today with 5 minutes of generalized tonic-clonic activity.  Family is noted a dramatic and rapid cognitive decline over the past 5 days with more significant decline in the last 2 days along with worsening icterus and jaundice. EEG done this evening with continuous generalized slowing. 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 - Continue Keppra 500 twice daily - Versed 2 mg IV as needed for seizure lasting more than 5 minutes. -MRI brain with and without contrast - Hook  up to continuous EEG.  If continuously seizing or intermittently showing more seizure activity, will add phenytoin.  Hesitant to add Depakote given hyperammonemia and liver dysfunction.  If EEG shows ongoing seizure activity, we will have to probably add antiepileptic and may be intubate and treat with Versed/propofol drips.  Remains full code for now. - Maintain seizure precautions -Repeat labs.  Check ammonia in the morning. - Continue treatment of hyperammonemia per primary team - May need LP after MRI if concern for carcinomatous meningitis remains. Plan discussed at the bedside with primary team resident physicians as well as patient's 3 family members including sister at bedside.   ADDENDUM EEG with slowing Await formal read. Hold off on 2nd AED Continue to treat toxic metabolic derangements.  ADDENDUM 0400 Another 2 min episode of seizure witnessed by family at bedside. EEG also concerning for seizure Load Fosphenytoin '20mg'$ /kg PE eq, Continue cEEG. Due to concern for brain mets, will start Dexamethasone as well.   Plan d/w primary team resident physicians.  -- Amie Portland, MD Neurologist [facility] Pager: [phone]  CRITICAL CARE ATTESTATION Performed by: Amie Portland, MD Total critical care time: 55 minutes Critical care time was exclusive of separately billable procedures and treating other patients and/or supervising APPs/Residents/Students Critical care was necessary to treat or prevent imminent or life-threatening deterioration due to seizures, status epilepticus This patient is critically ill and at significant risk for neurological worsening and/or death and care requires constant monitoring. Critical care was time spent personally by me on the following activities: development of treatment plan  with patient and/or surrogate as well as nursing, discussions with consultants, evaluation  of patient's response to treatment, examination of patient, obtaining history from patient or surrogate, ordering and performing treatments and interventions, ordering and review of laboratory studies, ordering and review of radiographic studies, pulse oximetry, re-evaluation of patient's condition, participation in multidisciplinary rounds and medical decision making of high complexity in the care of this patient.

## 2021-03-07 ENCOUNTER — Inpatient Hospital Stay: Payer: BLUE CROSS/BLUE SHIELD

## 2021-03-07 ENCOUNTER — Inpatient Hospital Stay (HOSPITAL_BASED_OUTPATIENT_CLINIC_OR_DEPARTMENT_OTHER): Payer: BLUE CROSS/BLUE SHIELD | Admitting: Oncology

## 2021-03-07 ENCOUNTER — Observation Stay (HOSPITAL_COMMUNITY): Payer: BLUE CROSS/BLUE SHIELD

## 2021-03-07 DIAGNOSIS — D649 Anemia, unspecified: Secondary | ICD-10-CM | POA: Diagnosis present

## 2021-03-07 DIAGNOSIS — Z20822 Contact with and (suspected) exposure to covid-19: Secondary | ICD-10-CM | POA: Diagnosis present

## 2021-03-07 DIAGNOSIS — M4852XA Collapsed vertebra, not elsewhere classified, cervical region, initial encounter for fracture: Secondary | ICD-10-CM | POA: Diagnosis present

## 2021-03-07 DIAGNOSIS — K7290 Hepatic failure, unspecified without coma: Secondary | ICD-10-CM | POA: Diagnosis present

## 2021-03-07 DIAGNOSIS — Z66 Do not resuscitate: Secondary | ICD-10-CM | POA: Diagnosis not present

## 2021-03-07 DIAGNOSIS — R188 Other ascites: Secondary | ICD-10-CM | POA: Diagnosis present

## 2021-03-07 DIAGNOSIS — E876 Hypokalemia: Secondary | ICD-10-CM | POA: Diagnosis present

## 2021-03-07 DIAGNOSIS — N179 Acute kidney failure, unspecified: Secondary | ICD-10-CM | POA: Diagnosis present

## 2021-03-07 DIAGNOSIS — M4854XA Collapsed vertebra, not elsewhere classified, thoracic region, initial encounter for fracture: Secondary | ICD-10-CM | POA: Diagnosis present

## 2021-03-07 DIAGNOSIS — D696 Thrombocytopenia, unspecified: Secondary | ICD-10-CM | POA: Diagnosis present

## 2021-03-07 DIAGNOSIS — I959 Hypotension, unspecified: Secondary | ICD-10-CM | POA: Diagnosis not present

## 2021-03-07 DIAGNOSIS — C50211 Malignant neoplasm of upper-inner quadrant of right female breast: Secondary | ICD-10-CM

## 2021-03-07 DIAGNOSIS — C7951 Secondary malignant neoplasm of bone: Secondary | ICD-10-CM | POA: Diagnosis present

## 2021-03-07 DIAGNOSIS — Z515 Encounter for palliative care: Secondary | ICD-10-CM | POA: Diagnosis not present

## 2021-03-07 DIAGNOSIS — R569 Unspecified convulsions: Secondary | ICD-10-CM | POA: Diagnosis present

## 2021-03-07 DIAGNOSIS — C7931 Secondary malignant neoplasm of brain: Secondary | ICD-10-CM | POA: Diagnosis present

## 2021-03-07 DIAGNOSIS — G9341 Metabolic encephalopathy: Secondary | ICD-10-CM | POA: Diagnosis present

## 2021-03-07 DIAGNOSIS — Z171 Estrogen receptor negative status [ER-]: Secondary | ICD-10-CM

## 2021-03-07 DIAGNOSIS — E871 Hypo-osmolality and hyponatremia: Secondary | ICD-10-CM | POA: Diagnosis present

## 2021-03-07 DIAGNOSIS — D72829 Elevated white blood cell count, unspecified: Secondary | ICD-10-CM | POA: Diagnosis present

## 2021-03-07 DIAGNOSIS — C787 Secondary malignant neoplasm of liver and intrahepatic bile duct: Secondary | ICD-10-CM | POA: Diagnosis present

## 2021-03-07 DIAGNOSIS — G40901 Epilepsy, unspecified, not intractable, with status epilepticus: Secondary | ICD-10-CM | POA: Diagnosis present

## 2021-03-07 DIAGNOSIS — G8929 Other chronic pain: Secondary | ICD-10-CM | POA: Diagnosis present

## 2021-03-07 DIAGNOSIS — E872 Acidosis: Secondary | ICD-10-CM | POA: Diagnosis not present

## 2021-03-07 LAB — MAGNESIUM: Magnesium: 2.4 mg/dL (ref 1.7–2.4)

## 2021-03-07 LAB — CBC
HCT: 29.5 % — ABNORMAL LOW (ref 36.0–46.0)
Hemoglobin: 9.9 g/dL — ABNORMAL LOW (ref 12.0–15.0)
MCH: 29.2 pg (ref 26.0–34.0)
MCHC: 33.6 g/dL (ref 30.0–36.0)
MCV: 87 fL (ref 80.0–100.0)
Platelets: 107 10*3/uL — ABNORMAL LOW (ref 150–400)
RBC: 3.39 MIL/uL — ABNORMAL LOW (ref 3.87–5.11)
RDW: 20.9 % — ABNORMAL HIGH (ref 11.5–15.5)
WBC: 13.1 10*3/uL — ABNORMAL HIGH (ref 4.0–10.5)
nRBC: 26.1 % — ABNORMAL HIGH (ref 0.0–0.2)

## 2021-03-07 LAB — COMPREHENSIVE METABOLIC PANEL
ALT: 121 U/L — ABNORMAL HIGH (ref 0–44)
AST: 934 U/L — ABNORMAL HIGH (ref 15–41)
Albumin: 1 g/dL — ABNORMAL LOW (ref 3.5–5.0)
Alkaline Phosphatase: 2105 U/L — ABNORMAL HIGH (ref 38–126)
Anion gap: 27 — ABNORMAL HIGH (ref 5–15)
BUN: 89 mg/dL — ABNORMAL HIGH (ref 6–20)
CO2: 9 mmol/L — ABNORMAL LOW (ref 22–32)
Calcium: 7.2 mg/dL — ABNORMAL LOW (ref 8.9–10.3)
Chloride: 93 mmol/L — ABNORMAL LOW (ref 98–111)
Creatinine, Ser: 3.54 mg/dL — ABNORMAL HIGH (ref 0.44–1.00)
GFR, Estimated: 15 mL/min — ABNORMAL LOW (ref >60)
Glucose, Bld: 71 mg/dL (ref 70–99)
Potassium: 4.1 mmol/L (ref 3.5–5.1)
Sodium: 129 mmol/L — ABNORMAL LOW (ref 135–145)
Total Bilirubin: 26.9 mg/dL (ref 0.3–1.2)
Total Protein: 4.5 g/dL — ABNORMAL LOW (ref 6.5–8.1)

## 2021-03-07 LAB — AMMONIA: Ammonia: 226 umol/L — ABNORMAL HIGH (ref 9–35)

## 2021-03-07 LAB — PHOSPHORUS: Phosphorus: 8.3 mg/dL — ABNORMAL HIGH (ref 2.5–4.6)

## 2021-03-07 MED ORDER — DEXAMETHASONE SODIUM PHOSPHATE 4 MG/ML IJ SOLN
4.0000 mg | Freq: Four times a day (QID) | INTRAMUSCULAR | Status: DC
  Filled 2021-03-07 (×3): qty 1

## 2021-03-07 MED ORDER — HYDROMORPHONE HCL 1 MG/ML IJ SOLN
1.0000 mg | INTRAMUSCULAR | Status: DC | PRN
Start: 2021-03-07 — End: 2021-03-08

## 2021-03-07 MED ORDER — ONDANSETRON HCL 4 MG/2ML IJ SOLN: 4.0000 mg | Freq: Four times a day (QID) | INTRAMUSCULAR | Status: DC | PRN

## 2021-03-07 MED ORDER — LORAZEPAM 2 MG/ML IJ SOLN
1.0000 mg | INTRAMUSCULAR | Status: DC | PRN
  Administered 2021-03-07: 1 mg via INTRAVENOUS
  Filled 2021-03-07: qty 1

## 2021-03-07 MED ORDER — HALOPERIDOL 0.5 MG PO TABS
0.5000 mg | ORAL_TABLET | ORAL | Status: DC | PRN
  Filled 2021-03-07: qty 1

## 2021-03-07 MED ORDER — ACETAMINOPHEN 325 MG PO TABS: 650.0000 mg | ORAL_TABLET | Freq: Four times a day (QID) | ORAL | Status: DC | PRN

## 2021-03-07 MED ORDER — SODIUM BICARBONATE 8.4 % IV SOLN: 50.0000 meq | Freq: Once | INTRAVENOUS | Status: DC

## 2021-03-07 MED ORDER — POLYVINYL ALCOHOL 1.4 % OP SOLN
1.0000 [drp] | Freq: Four times a day (QID) | OPHTHALMIC | Status: DC | PRN
  Filled 2021-03-07: qty 15

## 2021-03-07 MED ORDER — SODIUM BICARBONATE 8.4 % IV SOLN
INTRAVENOUS | Status: DC
  Filled 2021-03-07: qty 1000

## 2021-03-07 MED ORDER — LACTULOSE ENEMA
300.0000 mL | Freq: Once | ORAL | Status: AC
  Administered 2021-03-07: 300 mL via RECTAL

## 2021-03-07 MED ORDER — ONDANSETRON 4 MG PO TBDP: 4.0000 mg | ORAL_TABLET | Freq: Four times a day (QID) | ORAL | Status: DC | PRN

## 2021-03-07 MED ORDER — MORPHINE 100MG IN NS 100ML (1MG/ML) PREMIX INFUSION
1.0000 mg/h | INTRAVENOUS | Status: DC
  Administered 2021-03-07: 1 mg/h via INTRAVENOUS
  Filled 2021-03-07: qty 100

## 2021-03-07 MED ORDER — BIOTENE DRY MOUTH MT LIQD: 15.0000 mL | OROMUCOSAL | Status: DC | PRN

## 2021-03-07 MED ORDER — DEXAMETHASONE SODIUM PHOSPHATE 10 MG/ML IJ SOLN: 10.0000 mg | Freq: Once | INTRAMUSCULAR | Status: DC

## 2021-03-07 MED ORDER — GLYCOPYRROLATE 0.2 MG/ML IJ SOLN: 0.2000 mg | INTRAMUSCULAR | Status: DC | PRN

## 2021-03-07 MED ORDER — HALOPERIDOL LACTATE 2 MG/ML PO CONC
0.5000 mg | ORAL | Status: DC | PRN
  Filled 2021-03-07: qty 0.3

## 2021-03-07 MED ORDER — GLYCOPYRROLATE 1 MG PO TABS
1.0000 mg | ORAL_TABLET | ORAL | Status: DC | PRN
  Filled 2021-03-07: qty 1

## 2021-03-07 MED ORDER — HALOPERIDOL LACTATE 5 MG/ML IJ SOLN: 0.5000 mg | INTRAMUSCULAR | Status: DC | PRN

## 2021-03-07 MED ORDER — MORPHINE BOLUS VIA INFUSION
1.0000 mg | INTRAVENOUS | Status: DC | PRN
Start: 2021-03-07 — End: 2021-03-08
  Administered 2021-03-07 (×2): 1 mg via INTRAVENOUS
  Filled 2021-03-07: qty 1

## 2021-03-07 MED ORDER — SODIUM CHLORIDE 0.9 % IV SOLN
1500.0000 mg | Freq: Once | INTRAVENOUS | Status: AC
  Administered 2021-03-07: 1500 mg via INTRAVENOUS
  Filled 2021-03-07: qty 30

## 2021-03-07 MED ORDER — LACTATED RINGERS IV BOLUS
1000.0000 mL | Freq: Once | INTRAVENOUS | Status: AC
  Administered 2021-03-07: 1000 mL via INTRAVENOUS

## 2021-03-07 MED ORDER — ACETAMINOPHEN 650 MG RE SUPP: 650.0000 mg | Freq: Four times a day (QID) | RECTAL | Status: DC | PRN

## 2021-03-07 MED ORDER — MIDAZOLAM HCL 2 MG/2ML IJ SOLN
2.0000 mg | Freq: Once | INTRAMUSCULAR | Status: AC | PRN
  Administered 2021-03-07: 2 mg via INTRAVENOUS
  Filled 2021-03-07: qty 2

## 2021-03-08 LAB — PATHOLOGIST SMEAR REVIEW

## 2021-03-30 NOTE — Progress Notes (Signed)
HEMATOLOGY-ONCOLOGY PROGRESS NOTE  SUBJECTIVE: The patient's chart was reviewed.  She was brought to the emergency department due to acute metabolic encephalopathy and seizure.  She was found to be in acute renal failure.  Her alk phos was also noted to be greater than 2000, T bili 27, and also had an elevated AST and ALT.  She was hyperkalemic and acidotic.  CT head showed a subcentimeter lesion in the left cerebellar sphere consistent with brain mets.  Neurology saw the patient and she was given Keppra.  Additionally, she had a CT of the abdomen pelvis which showed progressive widespread hepatic metastatic disease and progressive widespread osseous metastatic disease.  She also had moderate ascites which was new.  After discussion with the family, they opted to transition the patient to comfort measures only.  She was placed on a morphine drip.  When I saw the patient today, multiple family members were at the bedside.  The patient had agonal breathing.  She was on morphine at 2 mg/h.  She appeared to be comfortable.  I was able to speak with her husband who indicated that he felt she was comfortable and had no additional needs at this time.  Oncology History  Malignant neoplasm of upper-outer quadrant of right breast in female, estrogen receptor negative ([name]) (Resolved)  10/03/2019 Cancer Staging   Staging form: Breast, AJCC 8th Edition - Clinical stage from 10/03/2019: Stage IIIB (cT3, cN0, cM0, G3, ER-, PR-, HER2-) - Signed by Krista [name] on 10/12/2019   10/12/2019 Initial Diagnosis   Malignant neoplasm of upper-outer quadrant of right breast in female, estrogen receptor negative (Krista [name])   Malignant neoplasm of upper-inner quadrant of right breast in female, estrogen receptor negative ([name])  10/17/2019 Initial Diagnosis   Malignant neoplasm of upper-inner quadrant of right breast in female, estrogen receptor negative (Krista [name])   10/17/2019 Cancer Staging   Staging form: Breast, AJCC 8th  Edition - Clinical: Stage IIIB (cT3, cN0, cM0, G3, ER-, PR-, HER2-) - Signed by Krista [name] on 10/17/2019   11/01/2019 Genetic Testing   Positive genetic testing:  One pathogenic variant in the BRCA1 gene called c.815_824dup (p.Thr276Alafs*14) detected on the [facility] Multi-Cancer panel. Two variants of uncertain significance - one in the MSH3 gene called c.1295T>G and one in the POLD1 gene called c.34G>A. The report date is 11/01/19.  The Multi-Cancer Panel offered by [facility] includes sequencing and/or deletion duplication testing of the following 85 genes: AIP, ALK, APC, ATM, AXIN2,BAP1,  BARD1, BLM, BMPR1A, BRCA1, BRCA2, BRIP1, CASR, CDC73, CDH1, CDK4, CDKN1B, CDKN1C, CDKN2A (p14ARF), CDKN2A (p16INK4a), CEBPA, CHEK2, CTNNA1, DICER1, DIS3L2, EGFR (c.2369C>T, p.Thr790Met variant only), EPCAM (Deletion/duplication testing only), FH, FLCN, GATA2, GPC3, GREM1 (Promoter region deletion/duplication testing only), HOXB13 (c.251G>A, p.Gly84Glu), HRAS, KIT, MAX, MEN1, MET, MITF (c.952G>A, p.Glu318Lys variant only), MLH1, MSH2, MSH3, MSH6, MUTYH, NBN, NF1, NF2, NTHL1, PALB2, PDGFRA, PHOX2B, PMS2, POLD1, POLE, POT1, PRKAR1A, PTCH1, PTEN, RAD50, RAD51C, RAD51D, RB1, RECQL4, RET, RNF43, RUNX1, SDHAF2, SDHA (sequence changes only), SDHB, SDHC, SDHD, SMAD4, SMARCA4, SMARCB1, SMARCE1, STK11, SUFU, TERC, TERT, TMEM127, TP53, TSC1, TSC2, VHL, WRN and WT1.   11/16/2019 - 02/14/2020 Chemotherapy   The patient had dexamethasone (DECADRON) 4 MG tablet, 1 of 1 cycle, Start date: 11/07/2019, End date: 06/01/2020 DOXOrubicin (ADRIAMYCIN) chemo injection 120 mg, 60 mg/m2 = 120 mg, Intravenous,  Once, 4 of 4 cycles Administration: 120 mg (11/16/2019), 120 mg (11/29/2019), 120 mg (12/13/2019), 120 mg (12/27/2019) palonosetron (ALOXI) injection 0.25 mg, 0.25 mg, Intravenous,  Once, 10 of  16 cycles Administration: 0.25 mg (11/16/2019), 0.25 mg (01/10/2020), 0.25 mg (01/17/2020), 0.25 mg (02/07/2020), 0.25 mg (02/14/2020), 0.25 mg  (11/29/2019), 0.25 mg (12/13/2019), 0.25 mg (12/27/2019), 0.25 mg (01/24/2020), 0.25 mg (01/31/2020) pegfilgrastim-jmdb (FULPHILA) injection 6 mg, 6 mg, Subcutaneous,  Once, 4 of 4 cycles Administration: 6 mg (11/18/2019), 6 mg (12/01/2019), 6 mg (12/15/2019), 6 mg (12/29/2019) CARBOplatin (PARAPLATIN) 300 mg in sodium chloride 0.9 % 250 mL chemo infusion, 300 mg, Intravenous,  Once, 1 of 1 cycle Administration: 300 mg (02/14/2020) CARBOplatin (PARAPLATIN) 300 mg in sodium chloride 0.9 % 250 mL chemo infusion, 300 mg (108.7 % of original dose 276 mg), Intravenous,  Once, 6 of 12 cycles Dose modification:   (original dose 276 mg, Cycle 5) Administration: 300 mg (01/10/2020), 300 mg (01/17/2020), 300 mg (02/07/2020), 300 mg (01/24/2020), 300 mg (01/31/2020) cyclophosphamide (CYTOXAN) 1,200 mg in sodium chloride 0.9 % 250 mL chemo infusion, 600 mg/m2 = 1,200 mg, Intravenous,  Once, 4 of 4 cycles Administration: 1,200 mg (11/16/2019), 1,200 mg (11/29/2019), 1,200 mg (12/13/2019), 1,200 mg (12/27/2019) PACLitaxel (TAXOL) 162 mg in sodium chloride 0.9 % 250 mL chemo infusion (</= 26m/m2), 80 mg/m2 = 162 mg, Intravenous,  Once, 6 of 12 cycles Administration: 162 mg (01/10/2020), 162 mg (01/17/2020), 162 mg (02/07/2020), 162 mg (02/14/2020), 162 mg (01/24/2020), 162 mg (01/31/2020) fosaprepitant (EMEND) 150 mg in sodium chloride 0.9 % 145 mL IVPB, 150 mg, Intravenous,  Once, 1 of 1 cycle Administration: 150 mg (11/16/2019)   for chemotherapy treatment.     06/05/2020 Cancer Staging   Staging form: Breast, AJCC 8th Edition - Pathologic stage from 06/05/2020: No Stage Recommended (ypT2, pN1a, cM0, G3, ER-, PR-, HER2-) - Signed by Krista [name] on 06/06/2020   01/15/2021 - 01/15/2021 Chemotherapy          Breast cancer, BRCA1 positive, right [place]  11/29/2019 Initial Diagnosis   Breast cancer, BRCA1 positive, right ([place]   01/07/2021 - 01/07/2021 Chemotherapy          01/15/2021 - 01/15/2021 Chemotherapy              REVIEW OF SYSTEMS:   Unable to obtain  I have reviewed the past medical history, past surgical history, social history and family history with the patient and they are unchanged from previous note.   PHYSICAL EXAMINATION:  Vitals:   [id number] 0October 08, [id number]  BP: (!) 81/58 (!) 43/30  Pulse: 97   Resp: 20   Temp:    SpO2: 99%    Filed Weights   03/14/2021 0841 [id number]  Weight: 78.9 kg 87 kg    Intake/Output from previous day: 09/07 0701 - 09/08 0700 In: 842.6 [IV Piggyback:842.6] Out: -   GENERAL: Agonal breathing NEURO: Somnolent  Not fully examined as patient is actively dying  LABORATORY DATA:  I have reviewed the data as listed CMP Latest Ref Rng & Units 910-01-2208/08/2020 03/23/2021  Glucose 70 - 99 mg/dL 71 138(H) 101(H)  BUN 6 - 20 mg/dL 89(H) 12 82(H)  Creatinine 0.44 - 1.00 mg/dL 3.54(H) 0.60 2.87(H)  Sodium 135 - 145 mmol/L 129(L) 137 128(L)  Potassium 3.5 - 5.1 mmol/L 4.1 4.0 3.1(L)  Chloride 98 - 111 mmol/L 93(L) 105 92(L)  CO2 22 - 32 mmol/L 9(L) 27 17(L)  Calcium 8.9 - 10.3 mg/dL 7.2(L) 9.3 7.2(L)  Total Protein 6.5 - 8.1 g/dL 4.5(L) 7.5 -  Total Bilirubin 0.3 - 1.2 mg/dL 26.9(HH) 0.5 -  Alkaline Phos 38 - 126 U/L 2,105(H) 62 -  AST 15 - 41 U/L 934(H) 21 -  ALT 0 - 44 U/L 121(H) 25 -    Lab Results  Component Value Date   WBC 13.1 (H) 03-16-21   HGB 9.9 (L) 03-16-2021   HCT 29.5 (L) 03/16/2021   MCV 87.0 2021-03-16   PLT 107 (L) 2021-03-16   NEUTROABS 8.9 (H) 03/05/2021    CT ABDOMEN PELVIS WO CONTRAST  Result Date: 03/01/2021 CLINICAL DATA:  Abdominal pain, acute nonlocalized. History of right breast cancer with lumpectomy and axillary node dissection in 2021. EXAM: CT ABDOMEN AND PELVIS WITHOUT CONTRAST TECHNIQUE: Multidetector CT imaging of the abdomen and pelvis was performed following the standard protocol without IV contrast. COMPARISON:  Abdominopelvic CT 10/31/2019. Abdominal MRI 12/27/2020. Chest CT 01/01/2021. FINDINGS:  Study was performed with the patient's arms at her side. This degrades image quality to some degree. Lower chest: Small right pleural effusion with associated mild atelectasis at the right lung base. Hepatobiliary: Progressive widespread hepatic metastatic disease has increased from the MRI, with lesions occupying the majority of the liver. There is a lesion in the dome of the right lobe which measures approximately 7.4 x 5.7 cm on image 13/3, previously 4.6 x 4.0 cm. The liver is enlarged, spanning 23.8 cm in height. Mildly increased density within the gallbladder lumen without evidence of stones, surrounding inflammation or biliary ductal dilatation. Pancreas: Unremarkable. No pancreatic ductal dilatation or surrounding inflammatory changes. Spleen: Normal in size without focal abnormality. Adrenals/Urinary Tract: Both adrenal glands appear normal. Both kidneys appear unremarkable. No evidence of urinary tract calculus, hydronephrosis or perinephric soft tissue stranding. The bladder appears unremarkable. Stomach/Bowel: No enteric contrast administered. Mild nonspecific small bowel and proximal colonic wall thickening, likely due to ascites. No evidence of obstruction or focal surrounding inflammation. The appendix appears normal. Vascular/Lymphatic: No discrete enlarged abdominopelvic lymph nodes are identified. No significant vascular findings on noncontrast imaging. Reproductive: Chronic lobular enlargement of the uterus consistent with fibroids. No adnexal mass. Other: New moderate amount of low-density ascites throughout the abdomen and pelvis. No peritoneal or omental masses identified. Mild generalized soft tissue edema. Musculoskeletal: Progressive widespread osseous metastatic disease with multiple enlarging lesions throughout the visualized thoracic and lumbar spine. There is progressive loss of height at a pathologic fracture involving T7 compared with the chest CT of 2 months ago. There is also a  pathologic fracture of the upper left sacrum associated with an enlarging lytic lesion measuring up to 5.8 x 4.3 cm on image 71/3. No significant intraspinal tumor identified. There is a large lytic lesion involving the right femoral neck which places the patient at risk for pathologic fracture. A lytic lesion is also noted in the right inferior pubic ramus. IMPRESSION: 1. Progressive widespread hepatic metastatic disease. 2. Progressive widespread osseous metastatic disease with progressive pathologic fracture at T7 and in the upper left sacrum. A large lesion in the right femoral neck places the patient at risk for a pathologic fracture. 3. New moderate ascites without peritoneal nodularity. Small right pleural effusion. 4. Mild small bowel and right colonic wall thickening, nonspecific and likely related to the patient's ascites. Electronically Signed   By: Richardean Sale M.D.   On: 03/19/2021 15:36   CT HEAD WO CONTRAST (5MM)  Result Date: 03/09/2021 CLINICAL DATA:  Seizure like activity, now altered mental status EXAM: CT HEAD WITHOUT CONTRAST TECHNIQUE: Contiguous axial images were obtained from the base of the skull through the vertex without intravenous contrast. COMPARISON:  Brain MRI 01/15/2021 FINDINGS: Brain: There is  no evidence of acute intracranial hemorrhage, extra-axial fluid collection, or infarct. There is a 5-6 mm hypodense lesion in the left cerebellar hemisphere corresponding to the ring-enhancing lesion seen on the prior MRI. No other mass lesion is identified. There is no mass effect. There is no midline shift. Vascular: No hyperdense vessel or unexpected calcification. Skull: There is heterogeneous density in the calvarium at the torcula and in the occipital bone just inferior to the lambdoid sutures at the midline corresponding to the enhancing lesion seen on the prior MRI. A lytic lesion is also seen in the right parietal bone near the vertex (3-64). A mixed lytic lesion is seen in the  left frontal bone just anterior to the suture (3-53). Sinuses/Orbits: The paranasal sinuses are clear. The globes and orbits are unremarkable. Other: None. IMPRESSION: 1. No acute intracranial pathology. 2. Subcentimeter lesion in the left cerebellar hemisphere corresponding to the ring-enhancing metastatic lesion seen on the prior MRI. No other mass lesion identified. No mass effect or midline shift. 3. Multiple calvarial metastases as above, grossly similar to the prior MRI allowing for differences in modality. Electronically Signed   By: Valetta Mole M.D.   On: 03/28/2021 10:03   DG Chest Portable 1 View  Result Date: 03/09/2021 CLINICAL DATA:  Seizure.  History of metastatic breast cancer. EXAM: PORTABLE CHEST 1 VIEW COMPARISON:  Lung bases from abdominal CT earlier today. Chest CT 01/01/2021 FINDINGS: Elevated right hemidiaphragm. Small right pleural effusion seen on CT earlier today. Lung volumes are low. The heart is normal in size. Mild right hilar prominence which is similar to prior CT. Evidence of acute airspace disease or pneumothorax. No pulmonary edema. Right breast surgical clips. Patient's known osseous metastatic disease not well seen by radiograph. IMPRESSION: 1. Low lung volumes with elevated right hemidiaphragm. 2. Small right pleural effusion seen on abdominal CT earlier today. Electronically Signed   By: Keith Rake M.D.   On: 03/21/2021 17:22   EEG adult  Result Date: 03/26/2021 Lora Havens, [name]     03/24/2021  4:44 PM Patient Name: Krista [name] MRN: [id number] Epilepsy Attending: Lora Havens Referring Physician/Provider: Myra Rude, [name] Date: 03/04/2021 Duration: 31.33 mins Patient history: 45 y.o. female with PMHx of right breast cancer in 2021 s/p lumpectomy with chemoradiation with multifocal liver lesions as well as bone and brain lesions who presented to the ED for evaluation of a witnessed 5 minute tonic-clonic seizure at home by husband.  EEG to evaluate  for seizures. Level of alertness: Awake, asleep AEDs during EEG study: Keppra Technical aspects: This EEG study was done with scalp electrodes positioned according to the 10-20 International system of electrode placement. Electrical activity was acquired at a sampling rate of _0  and reviewed with a high frequency filter of _1  and a low frequency filter of _2 . EEG data were recorded continuously and digitally stored. Description: No clear posterior dominant rhythm was seen.. Sleep was characterized by vertex waves, sleep spindles (12 to 14 Hz), maximal frontocentral region.  EEG showed continuous generalized 3 to 6 Hz theta and delta slowing.  Sharp transients were also noted in left frontal region..  Hyperventilation and photic stimulation were not performed.   ABNORMALITY - Continuous slow, generalized IMPRESSION: This study is suggestive of moderate diffuse encephalopathy, nonspecific etiology.  No seizures or definite epileptiform discharges were seen throughout the recording. If suspicion for ictal-interictal activity remains a concern, a prolonged study can be considered. Priyanka O Yadav   Overnight EEG with video  Result Date: March 21, 2021 Lora Havens, [name]     March 21, 2021  9:27 AM Patient Name: Krista [name] MRN: [id number] Epilepsy Attending: Lora Havens Referring Physician/Provider: Dr Amie [place] Duration: 03-21-2021 0017 to 2021-03-21 0919  Patient history: 45 y.o. female with PMHx of right breast cancer in 2021 s/p lumpectomy with chemoradiation with multifocal liver lesions as well as bone and brain lesions who presented to the ED for evaluation of a witnessed 5 minute tonic-clonic seizure at home by husband.  EEG to evaluate for seizures.  Level of alertness: Awake, asleep  AEDs during EEG study: Keppra  Technical aspects: This EEG study was done with scalp electrodes positioned according to the 10-20 International system of electrode placement. Electrical activity was acquired at a  sampling rate of _0  and reviewed with a high frequency filter of _1  and a low frequency filter of _2 . EEG data were recorded continuously and digitally stored.  Description: No clear posterior dominant rhythm was seen. Sleep was characterized by vertex waves, sleep spindles (12 to 14 Hz), maximal frontocentral region. EEG showed continuous generalized 3 to 6 Hz theta and delta slowing.   Hyperventilation and photic stimulation were not performed.  Two seizures were recorded on Mar 21, 2021 at [facility] and 0438. During the seizure patient initially had head version to right followed by head jerking followed by generalized tonic-clonic seizure.  Concomitant EEG showed sharply contoured 4 to 5 Hz theta slowing in left frontal region which within 4-5 seconds became generalized and had significant myogenic artifact. Towards the end of the seizure generalized periodic discharges with overriding fast activity were noted at 1 Hz.  Duration of seizure was about 1.5 minutes average ABNORMALITY - Focal seizure, left frontal region - Continuous slow, generalized  IMPRESSION: This study showed two seizures arising from left frontal region on 03/21/2021 at 0329 and 0438, average duration about 1.5 minutes.  During the seizure patient was noted to have head version to right followed by jerking followed by generalized tonic-clonic seizure.  Additionally EEG was suggestive of moderate diffuse encephalopathy, nonspecific etiology. Lora Havens    ASSESSMENT: 45 y.o. BRCA1 positive [place] woman status post right breast upper inner quadrant biopsy 10/03/2019 for a clinical mT3 N1, stage IIIC invasive ductal carcinoma, grade 3, triple negative, with an MIB-1 of 80%             (a) biopsy of a right axillary node 2019-11-09 showed carcinoma             (b) CT chest/abd/pelvis 10/31/2019 shows 1.5 cm hypodense lesion in liver, lung nodules <0.3 cm             (c) bone scan 10/31/2019 negative             (d) liver MRI 11/11/2019  confirms a targetoid lesion in the left hepatic lobe suspicious for metastasis                         (i) liver biopsy 2019-11-23 showed no evidence of malignancy (benign).             (e) baseline CA 27-29 on 11/16/2019 normal at 29.8   (1) genetics testing 2019-11-01 through the the [facility] Multi-Cancer panel found a pathogenic variant in the BRCA1 gene called c.815_824dup (p.Thr276Alafs*14)              (a) two variants of uncertain significance were noted - one in the [facility] gene called c.1295T>G and one in the  POLD1 gene called c.34G>A             (b) no additional mutations of concern were found in AIP, ALK, APC, ATM, AXIN2,BAP1,  BARD1, BLM, BMPR1A, BRCA1, BRCA2, BRIP1, CASR, CDC73, CDH1, CDK4, CDKN1B, CDKN1C, CDKN2A (p14ARF), CDKN2A (p16INK4a), CEBPA, CHEK2, CTNNA1, DICER1, DIS3L2, EGFR (c.2369C>T, p.Thr790Met variant only), EPCAM (Deletion/duplication testing only), FH, FLCN, GATA2, GPC3, GREM1 (Promoter region deletion/duplication testing only), HOXB13 (c.251G>A, p.Gly84Glu), HRAS, KIT, MAX, MEN1, MET, MITF (c.952G>A, p.Glu318Lys variant only), MLH1, MSH2, MSH3, MSH6, MUTYH, NBN, NF1, NF2, NTHL1, PALB2, PDGFRA, PHOX2B, PMS2, POLD1, POLE, POT1, PRKAR1A, PTCH1, PTEN, RAD50, RAD51C, RAD51D, RB1, RECQL4, RET, RNF43, RUNX1, SDHAF2, SDHA (sequence changes only), SDHB, SDHC, SDHD, SMAD4, SMARCA4, SMARCB1, SMARCE1, STK11, SUFU, TERC, TERT, TMEM127, TP53, TSC1, TSC2, VHL, WRN and WT1.   (2) neoadjuvant chemotherapy consisting of cyclophosphamide and doxorubicin in dose dense fashion x4 started 11/16/2019 and completed 12/27/2019, followed by paclitaxel and carboplatin weekly x12 starting 01/10/2020             (a) echo 10/24/2019 shows an ejection fraction in the 60-65% range             (b) patient opted to discontinue paclitaxel/carboplatin treatments after 6 doses, last dose 02/14/2020   (3) status post right lumpectomy 04/05/2020 for a residual ypT2 ypN1 invasive ductal carcinoma, grade 3, with  negative margins.             (a) the single removed right axillary lymph node was positive             (b) completion axillary lymph node dissection 05/10/2020 removed an additional 7 right axillary lymph nodes, all clear    (4) adjuvant radiation 06/19/2020 through 08/01/2020             (a) capecitabine sensitization prescribed but not started by the patient              (5) hemoglobin C trait:             (a) ferritin 10/17/2019 was 45 with saturation 25%, hemoglobin 11.8 and MCV 71.5             (b) hemoglobin electrophoresis 2019-11-22 showed: A: 62.6%, A2: 3.7%, C: 33.2%, F:0 5.1%, S: 0%   (6) molecular studies obtained from the 04/05/2020 sample showed the tumor to be PD-L1 positive, CPS: 100; genomic LOH was high, MSI stable and mismatch repair proficient.  PIK3CA and PTEN were not mutated   (7) post radiation treatment:             (a) capecitabine planned for 6 months at standard doses starting 08/17/2020, discontinued by patient 12/11/2020              METASTATIC DISEASE: July 2022, involving liver and bones , likely also lungs and nodes (8) initial workup:             (A) MRI of the abdomen 12/27/2020 shows multiple liver lesions occupying most of the left and right hepatic lobes as well as multiple bone lesions             (B) CT of the chest 0705 shows scattered very small lung nodules of unclear significance, mediastinal and hilar adenopathy             (C) total spinal MRI confirms multifocal bony metastatic disease especially in the lower cervical and thoracic spine with compression fractures at C7, T1, and T7.,  With no epidural tumor noted             (  D) CA 27-29              (E) biopsy   (9) at 01/08/2021 visit recommended Abraxane day 1 day 8 with pembrolizumab day 1, repeated every 21 days; other options were pembrolizumab alone or olaparib   (10) also recommended denosumab/Xgeva and kyphoplasty     PLAN: Leeester has stage IV breast cancer and opted not to  pursue treatment or additional work-up.  She now presents to the hospital with altered mental status and seizures.  She has progressive disease noted on imaging.  Her family has opted to pursue comfort measures only.  She appears to be actively dying.  Agree with DNR/DNI and comfort measures only.  Anticipate that she may have only hours left to live.  Anticipate hospital death.  Support given to family.  Appreciate excellent care of the primary team.   LOS: 0 days   Mikey Bussing, DNP, AGPCNP-BC, AOCNP April 05, 2021

## 2021-03-30 NOTE — Procedures (Signed)
Patient Name: Krista [name]  MRN: [id number]  Epilepsy Attending: Lora Havens  Referring Physician/Provider: Dr Amie [place] Duration: 2021/03/08 0017 to 2021/03/08 0919   Patient history: 45 y.o. female with PMHx of right breast cancer in 2021 s/p lumpectomy with chemoradiation with multifocal liver lesions as well as bone and brain lesions who presented to the ED for evaluation of a witnessed 5 minute tonic-clonic seizure at home by husband.  EEG to evaluate for seizures.   Level of alertness: Awake, asleep   AEDs during EEG study: Keppra   Technical aspects: This EEG study was done with scalp electrodes positioned according to the 10-20 International system of electrode placement. Electrical activity was acquired at a sampling rate of '500Hz'$  and reviewed with a high frequency filter of '70Hz'$  and a low frequency filter of '1Hz'$ . EEG data were recorded continuously and digitally stored.    Description: No clear posterior dominant rhythm was seen. Sleep was characterized by vertex waves, sleep spindles (12 to 14 Hz), maximal frontocentral region. EEG showed continuous generalized 3 to 6 Hz theta and delta slowing.   Hyperventilation and photic stimulation were not performed.    Two seizures were recorded on 2021-03-08 at [facility] and 0438. During the seizure patient initially had head version to right followed by head jerking followed by generalized tonic-clonic seizure.  Concomitant EEG showed sharply contoured 4 to 5 Hz theta slowing in left frontal region which within 4-5 seconds became generalized and had significant myogenic artifact. Towards the end of the seizure generalized periodic discharges with overriding fast activity were noted at 1 Hz.  Duration of seizure was about 1.5 minutes average  ABNORMALITY - Focal seizure, left frontal region - Continuous slow, generalized   IMPRESSION: This study showed two seizures arising from left frontal region on Mar 08, 2021 at 0329 and 0438, average  duration about 1.5 minutes.  During the seizure patient was noted to have head version to right followed by jerking followed by generalized tonic-clonic seizure.  Additionally EEG was suggestive of moderate diffuse encephalopathy, nonspecific etiology.   Milbern Doescher Barbra Sarks

## 2021-03-30 NOTE — Progress Notes (Signed)
Patient pronounced by primary RN and second RN Gaspar Bidding) at [phone]. Several family members including spouse was at bedside. MD was called and made aware.

## 2021-03-30 NOTE — Progress Notes (Signed)
Patient seen in the ED. Family has decided to transition to full comfort measures. Patient's husband Raquel Sarna and her two sisters are all in agreement. She has been made DNR. We will continue to support her until her three children and other family members can be present. Can stop continuous EEG and cardiac monitoring. Full note to follow.

## 2021-03-30 NOTE — ED Notes (Signed)
RN placed pulse oximeter on pt, not reading an O2 sat. Unable to palpate radial pulses, pt still has 1+ central pulses

## 2021-03-30 NOTE — Progress Notes (Signed)
Patient evaluated at bedside after review of morning labs, overnight events, and vitals was concerning for further decompensation.   Overnight she exhibited seizure-like activity per family and had R sided gaze, was unresponsive. Neurology evaluated her at bedside and she was placed on continuous EEG. Depakote held given hyperammonemia and liver dysfunction. She was started on IV Keppra 500 mg BID per neurology recommendations as well as additional lactulose enema. Overnight time received additional page for seizure-like activity (not confirmed as EEG was not on recording mode) and she was given fosphenytoin per neurology recommendations. MAPs continued to trend downwards with measurements in 50s-60s. She was given a bolus of fluid and showed response to MAP in 70s and improvement of BP. At 730 09/08 she was given a dose of versed for continued seizure-like activity and reports of agitation and patient ripping things off of her.  On my exam her sclera are icteric and she is obtunded with occasional grunts. Eyes are fixed in an open position with R upward gaze. BP continues to run in [id number] systolic.  One of her sisters, J, is at bedside and conversation was had regarding prognosis. She understands that the prognosis is poor at this point and is adamant about not wanting her sister to suffer. She is calling in [place], the patient's husband, as well as her other sisters and [place]'s children to come to ED for end of life conversations.  PCCM consulted and aware of patient. Stat palliative consult placed.  Initiated 1 amp of bivarb with infusion of 150 mEq in dextrose  ABG, bicarb levels pending.

## 2021-03-30 NOTE — ED Notes (Signed)
Internal Medicine at bedside, informed of BP.

## 2021-03-30 NOTE — Progress Notes (Signed)
2nd shift tech set up LTM in ED.

## 2021-03-30 NOTE — Progress Notes (Signed)
Discontinued cEEG study.  Notified [facility] monitoring.  No skin breakdown observed. 

## 2021-03-30 NOTE — Discharge Summary (Addendum)
  Name: Krista [name] MRN: [id number] DOB: 04/26/1976 45 y.o.  Date of Admission: 03/01/2021  8:35 AM Date of Discharge: 03/11/2021 Attending Physician: No att. providers found  Discharge Diagnosis: Active Problems:   Seizure ([place])   End of life care   Cause of death: Metastatic breast cancer c/b seizures and metabolic derangements  Time of death: 5621 03-10-2021  Disposition and follow-up:   Ms.Krista [name] was discharged from [facility] in expired condition.    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 She was last seen by oncology on 01/08/2021. She was receiving denusumab for her bone metastasis and hypercalcemia of malgincancy, but she had declined all further treatment for her cancer at that appointment. It was documented that patient felt there was no point in continuing with treatment given her disease progression. She was counseled on the risk of liver failure and likely rapid decline without further intervention and had been offered a hospice referral, but had not yet decided. She was seen by radiation oncology on 02/05/2021 and documented in that encounter that she was only interested in palliative measures to alleviate her symptoms. Decision was made to defer stereotactic radiation to the brain and she proceeded with RT to the sacrum alone.    She presented yesterday with acute metabolic encephalopathy after a 5 minute witnessed tonic clonic seizure from home. Per family, she had a rapid cognitive and functional decline over a the past couple of days preceding her seizure. On arrival she was found to be in a acute renal and liver failure with serum creatinine of 2.90 from previously normal baseline, Alk phos > 2,000 from 651 one month  prior, and T bili of 27 from 4.4 with elevated AST and ALT of 160 and 63 respectively. She was also hyperkalemic and acidotic with potassium of 2.8, bicarb of 17, and anion gap of 21. CT head on arrival showed a sub centimeter lesion in the left cerebellar hemisphere corresponding to the ring-enhancing metastatic lesion from prior MRI concerning for brain metastases. Neurology was consulted and she was loaded with IV keppra. Due to the lack of edema on her most recent brain MRI, it was felt that her seizure was more likely precipitated by acute metabolic derangements from her multiorgan failure rather than vasogenic brain edema. But repeat MRI was ordered for further work up. She was placed on continuous EEG monitoring and had a second tonic clonic seizure around 4am. She received a dose of fosphenytoin. [1]  On the day of her ultimately passing she was persistently altered and intermittently following commands. She had scleral icterus and yellowing of her skin. She was tachycardic. She also had abdominal distention. Her labs at that time showed worsening liver failure, renal failure, and acidosis with a bicarb of 9 depsite IV bicarb infusion.   The attending physician as well as the rest of the team discussed the patient's poor prognosis. The patient was transitioned to DNR with comfort measures only. She became hypotensive with an irregular heart rhythm and passed 03-11-23 at 1553.    Signed: Rick Duff, MD 03/11/2021, 2:08 PM   [1] Progress note 10-Mar-2021 Dr. [name] MD

## 2021-03-30 NOTE — ED Notes (Signed)
Family at bedside. Wants all monitoring equipment removed , MD aware all equipment was removed, comfort care given.,

## 2021-03-30 NOTE — Progress Notes (Addendum)
Paged to bedside by Hassan Rowan, patient's RN as patient had episode of seizure-like activity.  Upon arrival patient with right upward gaze.  Patient's family states that patient vocalize she had to use the bathroom and she had flexion of her upper extremities bilaterally as well as her lower extremities and became unresponsive and began to shake.  This occurred for a few seconds.  Upon our arrival Dr. Malen Gauze of neurology was immediately consulted and recommended holding off on antiepileptics until he evaluated her at bedside.  Difficult to tell whether patient is having seizures or if her altered mental status is secondary to her high ammonia levels.  Patient has had no bowel movement per nursing staff.  On my exam patient has right upper gaze and does withdraw to pain but is having no verbal responses.  Will start patient on IV Keppra 500 mg twice daily per neurology's recommendations and put patient back on EEG monitoring to determine whether or not she is having further seizure-like activity.  We will also give additional dose of lactulose enema right now and an additional 8 hours.  We will titrate this dose based on her response.  It also appears that her evening BMP has drastic changes that I am uncertain if they are truly her labs.  We will repeat CMP at this time and repeat ammonia level tomorrow morning.  Overall low threshold for transfer to the ICU, patient is critically ill and if she is having recurrent seizures there is concern that she will be unable to protect her airway.  Dr. Malen Gauze will continue to follow this evening, greatly appreciate his assistance.   [place]  Internal Medicine Resident PGY-2 [place]  Pager: [phone]

## 2021-03-30 NOTE — Progress Notes (Signed)
Paged by RN that patient had seizure-like activity.  Went to bedside, patient's sister and nursing staff witnessed the event.  It lasted 1.5 to 2 minutes.  Patient flexed her arms and head shaking movements.  Prior to this patient complained of left arm pain and then had the event.  She is hooked up to the EEG at this time however the record button was not initiated.  Discussed with Dr. Malen Gauze.  Patient given loading dose of fosphenytoin.  IV Versed 2 mg as needed ordered, as needed precautions in place only to administer if patient is actively seizing for over 5 minutes and continues to actively seize.  Specific instructions placed that if patient is to stop seizing then the medication is not to be administered.

## 2021-03-30 NOTE — Progress Notes (Addendum)
Internal Medicine Attending Note:  I have seen and evaluated this patient and I have discussed the plan of care with the house staff.   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 She was last seen by oncology on 01/08/2021. She was receiving denusumab for her bone metastasis and hypercalcemia of malgincancy, but she had declined all further treatment for her cancer at that appointment. It was documented that patient felt there was no point in continuing with treatment given her disease progression. She was counseled on the risk of liver failure and likely rapid decline without further intervention and had been offered a hospice referral, but had not yet decided. She was seen by radiation oncology on 02/05/2021 and documented in that encounter that she was only interested in palliative measures to alleviate her symptoms. Decision was made to defer stereotactic radiation to the brain and she proceeded with RT to the sacrum alone.   She presented yesterday with acute metabolic encephalopathy after a 5 minute witnessed tonic clonic seizure from home. Per family, she had a rapid cognitive and functional decline over a the past couple of days preceding her seizure. On arrival she was found to be in a acute renal and liver failure with serum creatinine of 2.90 from previously normal baseline, Alk phos > 2,000 from 651 one month prior, and T bili of 27 from 4.4 with elevated AST and ALT of 160 and 63 respectively. She was also hyperkalemic and acidotic with potassium of 2.8, bicarb of 17, and anion gap of 21. CT head on arrival showed a sub centimeter lesion in the left cerebellar hemisphere corresponding to the ring-enhancing metastatic lesion from prior MRI concerning for brain metastases. Neurology was consulted and she was  loaded with IV keppra. Due to the lack of edema on her most recent brain MRI, it was felt that her seizure was more likely precipitated by acute metabolic derangements from her multiorgan failure rather than vasogenic brain edema. But repeat MRI was ordered for further work up. She was placed on continuous EEG monitoring and had a second tonic clonic seizure around 4am. She received a dose of fosphenytoin.   On evaluation earlier this morning, patient was persistently altered. She was intermittently following commands but non verbal. She had scleral icterus and jaundice of her skin. She was tachycardic but regular. Abdomen distended with hypoactive bowel sounds. Morning labs show worsening liver failure, worsening renal failure, and worsening acidosis with a bicarb of 9 despite bicarb infusion and IVFs.   Her husband Krista [name] and both her sisters were at bedside. I explained her extremely poor prognosis. She is currently suffering multiorgan failure from her metastatic disease. Unfortunately her cancer is incurable and despite our interventions here in the hospital, I anticipate impending cardiac arrest. Family is all in agreement with changing her to DNR status and transitioning to comfort measures only. They do not want her to suffer and recognize that her cancer is terminal.   I have just re evaluated patient since our conversation this morning and patient continues to decline. She is agonally breathing. Heart rhythm is erratic and her blood pressure is dropping, now 43/30. Explain to family that she is in the active stages of dying. Anticipate minutes to hours left of her life. We have added PRN morphine bolus and infusion for her air hunger now  that all family members are present.    Krista Ochs, MD 03-24-21, 10:58 AM

## 2021-03-30 NOTE — Progress Notes (Signed)
Patient evaluated at bedside. Morphine drip set 2 mg/hr. On examination she appears comfortably resting. HR on auscultation of around 72 bpm, RR around 18. Family in room and outside of room state she has not been in distress and seems to be comfortable at this time.

## 2021-03-30 NOTE — ED Notes (Addendum)
LR Rate changed to 176m/h per KRiesa Pope MD

## 2021-03-30 NOTE — Progress Notes (Signed)
Paged by RN that patient's maps were in the 102s and low 60s.  Went to bedside and moved blood pressure cuff from wrist to brachial position.  Pressures improved with bolus of IV fluids, maps in the 70s.  Patient hemodynamic stable at this time however she is high risk for decompensation.  Will discuss with day team about consult to PCCM.  She continues to be unresponsive with frequent episodes of seizure activity throughout the night per patient's family members.

## 2021-03-30 NOTE — Progress Notes (Signed)
Krista [name] was admitted to the hospital today with what appears to be terminal developments.

## 2021-03-30 DEATH — deceased

## 2021-04-02 ENCOUNTER — Ambulatory Visit: Payer: Self-pay | Admitting: Radiation Oncology

## 2021-04-17 NOTE — Progress Notes (Signed)
                                                                                                                                                             Patient Name: Krista [name] MRN: [id number] DOB: 01-Jan-1976 Referring Physician: Everett Graff Date of Service: 03/01/2021 [facility] [place]                                                        End Of Treatment Note  Diagnoses: C79.51-Secondary malignant neoplasm of bone  Cancer Staging: STAGE IV BREAST CANCER  Intent: Palliative  Radiation Treatment Dates: 02/25/2021 through 03/01/2021 Site Technique Total Dose (Gy) Dose per Fx (Gy) Completed Fx Beam Energies  Back: Spine_sacral 3D 20/20 4 5/5 10X, 15X   Narrative: The patient tolerated radiation therapy relatively well.   Plan: Ms. Krista [name] sadly passed away after completing radiation therapy - admitted after a seizure and found to be in liver and renal failure.  -----------------------------------  Eppie Gibson, MD

## 2021-08-08 ENCOUNTER — Encounter (HOSPITAL_COMMUNITY): Payer: Self-pay

## 2021-10-19 IMAGING — MR MR BREAST BILAT WO/W CM
10 of 14 series · 36 of 48 positions shown · IV contrast (9ml gadavist)
Comparison: Prior mammograms and ultrasounds dated 09/26/2019 and
10/03/2019.

CLINICAL DATA: 44-year-old female with recently diagnosed grade 3
invasive ductal carcinoma of the right breast post ultrasound-guided
core biopsy of a 5.1 cm palpable mass at the 1 o'clock position.

The patient does have a very strong family history of breast cancer,
including in her mother, sister as well as a maternal aunt all
diagnosed before age 50.
LABS:  Not applicable.
EXAM:
BILATERAL BREAST MRI WITH AND WITHOUT CONTRAST
TECHNIQUE: Multiplanar, multisequence MR images of both breasts were obtained
prior to and following the intravenous administration of 9 ml of
Gadavist

[Series 2: t2_tirm_tra ipat (a-p) · axial · 3.0mm · 0.70mm/px · 1 of 55 slices shown]
[im 1/55]
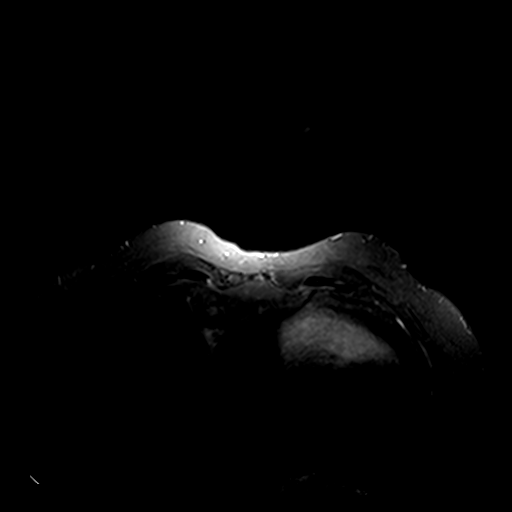

[Series 3: fl3d pre-cm no · axial · non-contrast · 1.2mm · 1.02mm/px · z∈[-87,+85]mm · 5 of 144 slices shown]
[im 1/144]
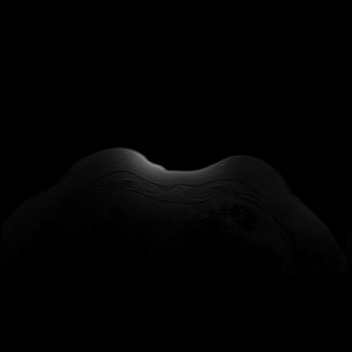
[im 36/144]
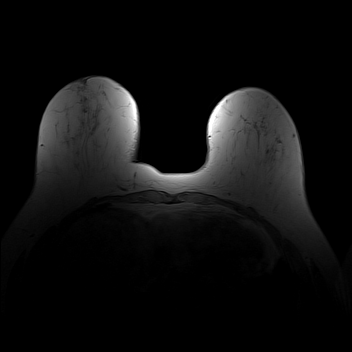
[im 72/144]
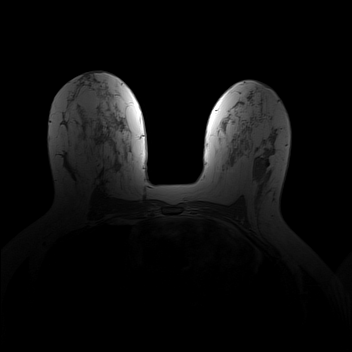
[im 108/144]
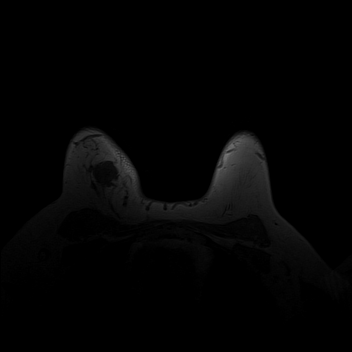
[im 144/144]
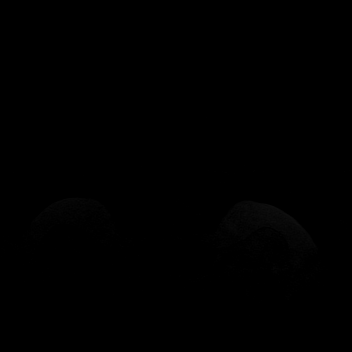

[Series 4: fl3d pre-cm · axial · non-contrast · 1.2mm · 1.02mm/px · z∈[-87,+85]mm · 5 of 144 slices shown]
[im 1/144]
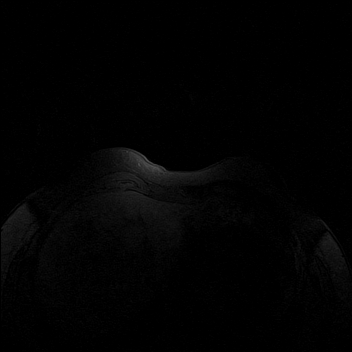
[im 36/144]
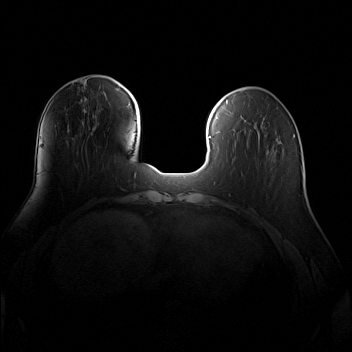
[im 72/144]
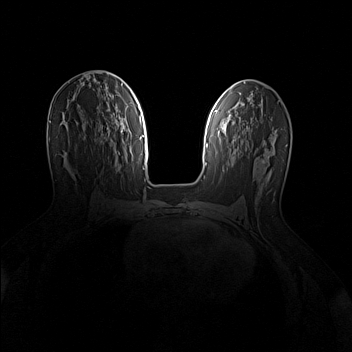
[im 108/144]
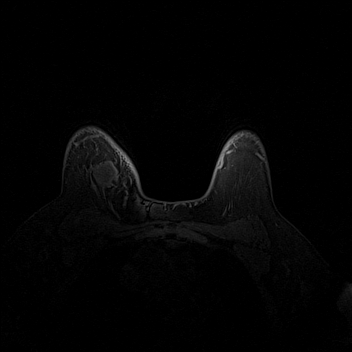
[im 144/144]
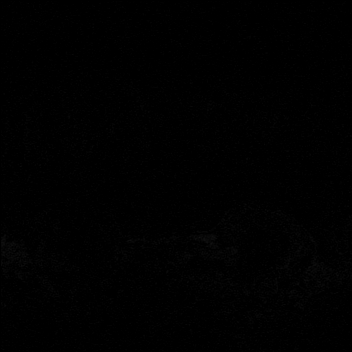

[Series 5: fl3d post-cm 20 · axial · 1.2mm · 1.02mm/px · z∈[-87,+85]mm · 5 of 144 slices shown (1 of 3)]
[im 1/144]
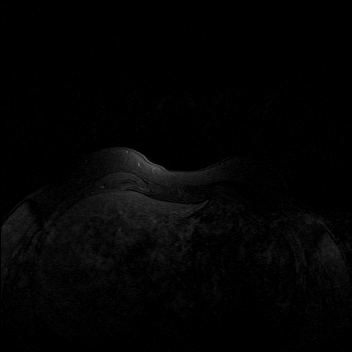
[im 36/144]
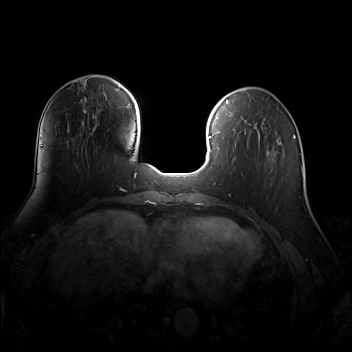
[im 72/144]
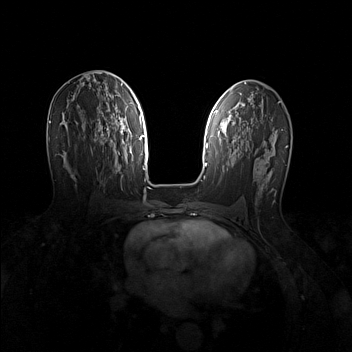
[im 108/144]
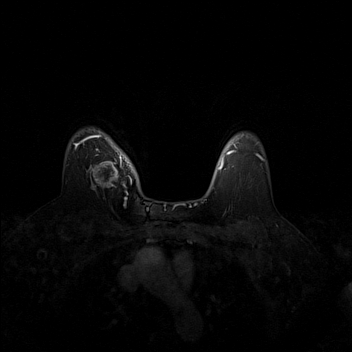
[im 144/144]
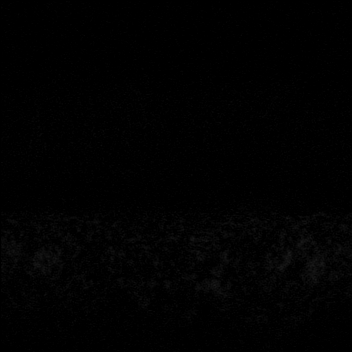

[Series 6: fl3d post-cm 20 · axial · 1.2mm · 1.02mm/px · z∈[-87,+85]mm · 5 of 144 slices shown (2 of 3)]
[im 1/144]
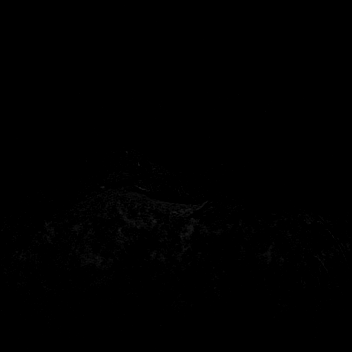
[im 36/144]
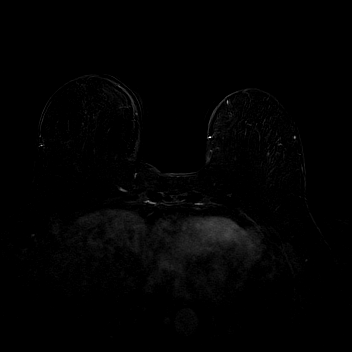
[im 72/144]
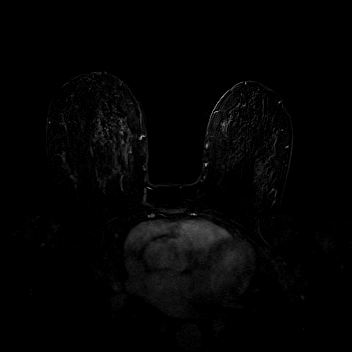
[im 108/144]
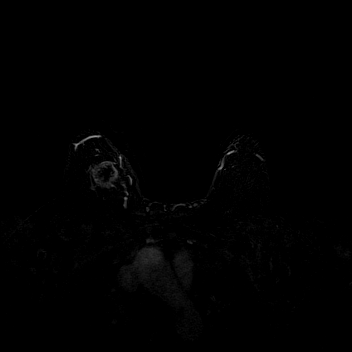
[im 144/144]
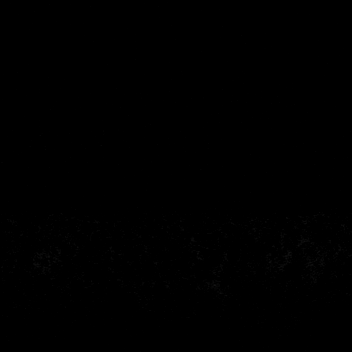

[Series 7: fl3d post-cm 20 · axial · 172.8mm · 1.02mm/px · 1 of 1 slices shown (3 of 3)]
[im 1/1]
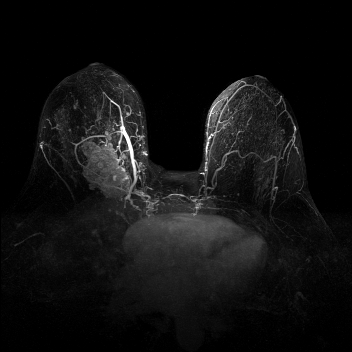

[Series 8: fl3d post-cm 3min · axial · 1.2mm · 1.02mm/px · z∈[-87,+85]mm · 5 of 144 slices shown]
[im 1/144]
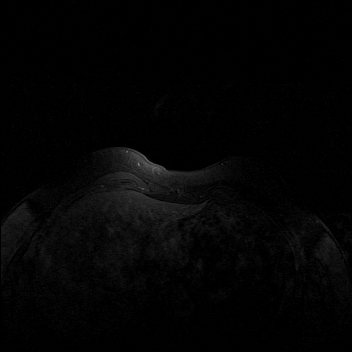
[im 36/144]
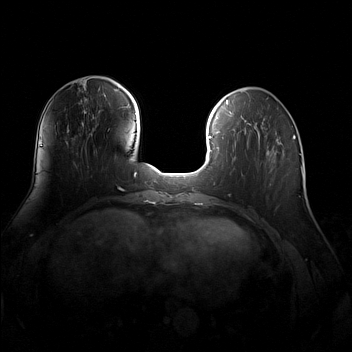
[im 72/144]
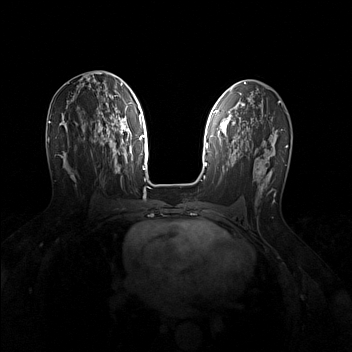
[im 108/144]
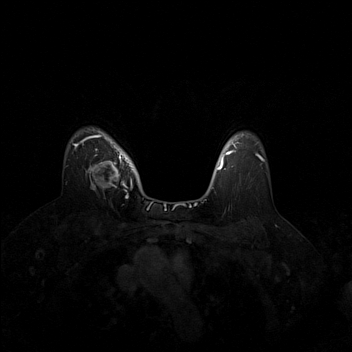
[im 144/144]
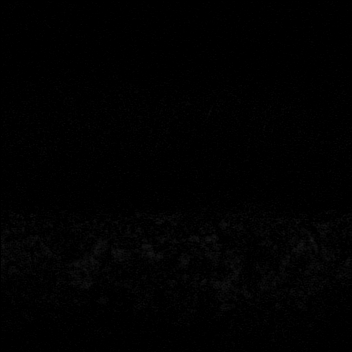

[Series 9: fl3d post-cm 3min_sub · axial · 1.2mm · 1.02mm/px · z∈[-87,+85]mm · 5 of 144 slices shown]
[im 1/144]
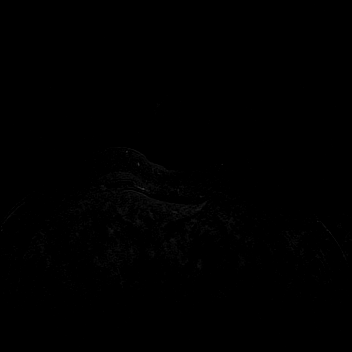
[im 36/144]
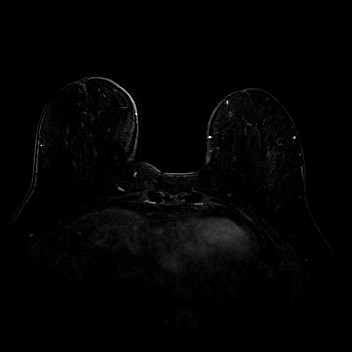
[im 72/144]
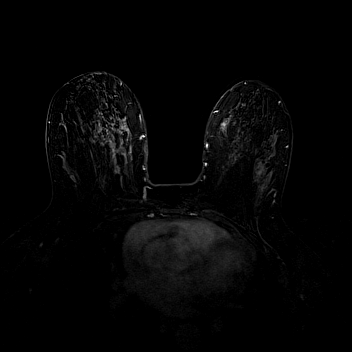
[im 108/144]
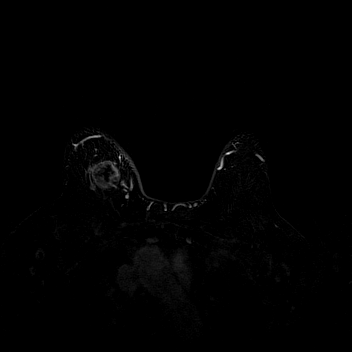
[im 144/144]
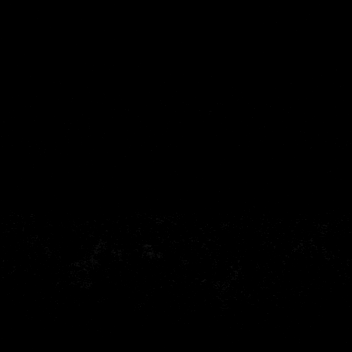

[Series 10: fl3d post-cm 3min_sub_mip_tra · axial · 172.8mm · 1.02mm/px · 1 of 1 slices shown]
[im 1/1]
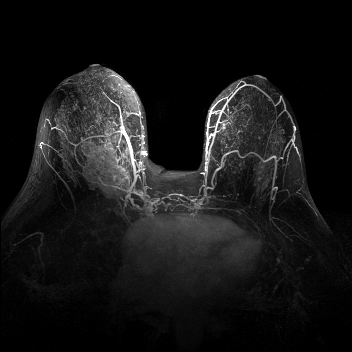

[Series 11: fl3d post-cm 5min · axial · 1.2mm · 1.02mm/px · z∈[-87,-19]mm · 3 of 144 slices shown]
[im 1/144]
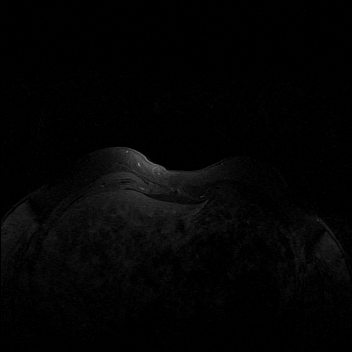
[im 29/144]
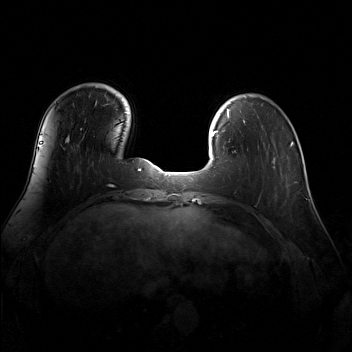
[im 58/144]
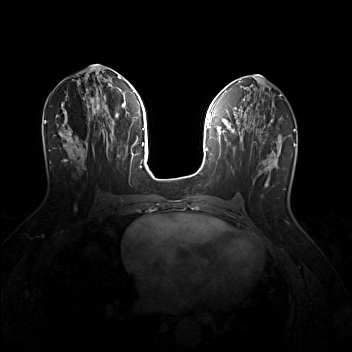

[36 of 48 positions shown; findings below may reference images not displayed]

Three-dimensional MR images were rendered by post-processing of the
original MR data on an independent workstation. The
three-dimensional MR images were interpreted, and findings are
reported in the following complete MRI report for this study. Three
dimensional images were evaluated at the independent [employer]
workstation
FINDINGS: Breast composition: c.  Heterogeneous fibroglandular tissue.

Background parenchymal enhancement: Mild.

Right breast: Heterogeneously enhancing partially necrotic mass at
site of biopsy proven malignancy in the upper inner right breast
measures up to 7.9 cm AP, 4.6 cm transverse, and 4.6 cm
craniocaudal. Several small satellite masses are present along the
inferior margin of the dominant mass (subtraction images 68-70. In
addition, there is linear oriented non mass enhancement (subtraction
image 79) in the lower inner right breast measuring 3.5 cm AP.

Left breast: No mass or abnormal enhancement.

Lymph nodes: There is a morphologically abnormal level I in the
right axilla (series 3, image 46) measuring up to 2.2 cm.

Ancillary findings:  None.
IMPRESSION: 1. Biopsy proven malignancy in the upper inner right breast with
small adjacent satellite masses along the inferior margin measures
up to 7.9 x 4.6 x 4.6 cm.

2. Suspicious linear oriented enhancement in the lower inner
quadrant of the right breast slightly inferior to the dominant mass
measures up to 3.5 cm.

3.  Morphologically abnormal level I lymph node in the right axilla.

4.  No MRI evidence of malignancy in the left breast.

RECOMMENDATION:
1. Recommend the patient return for second-look ultrasound and
biopsy of the morphologically abnormal lymph node in the right
axilla.

2. If breast conservation is a consideration, then recommend the
patient return for MRI guided biopsy of the linear non mass
enhancement in the lower inner quadrant of the right breast
(subtraction image 79).

BI-RADS CATEGORY  4: Suspicious.
